# Patient Record
Sex: Male | Born: 1945 | Race: Black or African American | Hispanic: No | Smoking: Current every day smoker
Health system: Southern US, Community
[De-identification: ages and names within clinical notes are randomized; demographics above are authoritative.]

## PROBLEM LIST (undated history)

## (undated) ENCOUNTER — Emergency Department: Admission: EM | Payer: Medicare Other | Source: Home / Self Care

## (undated) DIAGNOSIS — C801 Malignant (primary) neoplasm, unspecified: Secondary | ICD-10-CM

## (undated) DIAGNOSIS — F17200 Nicotine dependence, unspecified, uncomplicated: Secondary | ICD-10-CM

## (undated) DIAGNOSIS — I509 Heart failure, unspecified: Secondary | ICD-10-CM

## (undated) DIAGNOSIS — J449 Chronic obstructive pulmonary disease, unspecified: Secondary | ICD-10-CM

## (undated) DIAGNOSIS — Z87442 Personal history of urinary calculi: Secondary | ICD-10-CM

## (undated) DIAGNOSIS — M1990 Unspecified osteoarthritis, unspecified site: Secondary | ICD-10-CM

## (undated) DIAGNOSIS — N400 Enlarged prostate without lower urinary tract symptoms: Secondary | ICD-10-CM

## (undated) DIAGNOSIS — R918 Other nonspecific abnormal finding of lung field: Secondary | ICD-10-CM

## (undated) DIAGNOSIS — I2510 Atherosclerotic heart disease of native coronary artery without angina pectoris: Secondary | ICD-10-CM

## (undated) DIAGNOSIS — E785 Hyperlipidemia, unspecified: Secondary | ICD-10-CM

## (undated) DIAGNOSIS — A159 Respiratory tuberculosis unspecified: Secondary | ICD-10-CM

## (undated) DIAGNOSIS — I279 Pulmonary heart disease, unspecified: Secondary | ICD-10-CM

## (undated) DIAGNOSIS — F419 Anxiety disorder, unspecified: Secondary | ICD-10-CM

## (undated) DIAGNOSIS — IMO0001 Reserved for inherently not codable concepts without codable children: Secondary | ICD-10-CM

## (undated) DIAGNOSIS — Z973 Presence of spectacles and contact lenses: Secondary | ICD-10-CM

## (undated) DIAGNOSIS — I10 Essential (primary) hypertension: Secondary | ICD-10-CM

## (undated) DIAGNOSIS — G2581 Restless legs syndrome: Secondary | ICD-10-CM

## (undated) HISTORY — DX: Essential (primary) hypertension: I10

## (undated) HISTORY — PX: COLONOSCOPY: SHX174

## (undated) HISTORY — DX: Other nonspecific abnormal finding of lung field: R91.8

## (undated) HISTORY — DX: Atherosclerotic heart disease of native coronary artery without angina pectoris: I25.10

## (undated) HISTORY — PX: BACK SURGERY: SHX140

## (undated) HISTORY — DX: Nicotine dependence, unspecified, uncomplicated: F17.200

## (undated) HISTORY — PX: OTHER SURGICAL HISTORY: SHX169

## (undated) HISTORY — DX: Heart failure, unspecified: I50.9

## (undated) HISTORY — DX: Hyperlipidemia, unspecified: E78.5

## (undated) HISTORY — PX: CERVICAL FUSION: SHX112

## (undated) HISTORY — DX: Pulmonary heart disease, unspecified: I27.9

## (undated) HISTORY — PX: FRACTURE SURGERY: SHX138

## (undated) HISTORY — DX: Benign prostatic hyperplasia without lower urinary tract symptoms: N40.0

---

## 2010-06-29 ENCOUNTER — Encounter: Admission: RE | Admit: 2010-06-29 | Discharge: 2010-06-29 | Payer: Self-pay | Admitting: Neurosurgery

## 2010-08-06 ENCOUNTER — Inpatient Hospital Stay (HOSPITAL_COMMUNITY): Admission: RE | Admit: 2010-08-06 | Discharge: 2010-08-07 | Payer: Self-pay | Admitting: Neurosurgery

## 2010-09-27 NOTE — H&P (Signed)
  NAMEJAKING, THAYER                 ACCOUNT NO.:  [id number]  MEDICAL RECORD NO.:  [id number]           PATIENT TYPE:  LOCATION:                                 FACILITY:  PHYSICIAN:  Coletta Memos, M.D.     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  INDICATIONS:  [name] is a gentleman who I have followed in my office for a number of years.  He presented with florid myelopathy secondary to cervical stenosis at C4-5 and C5-6 and with a spastic gait. He was able to maintain continence and bowel and bladder function.  PAST MEDICAL HISTORY: 1. Hypertension. 2. Arthritis. 3. Benign prostatic hypertrophy. 4. Cervical stenosis.  PHYSICAL EXAMINATION:  GENERAL:  He is alert, oriented x4, and answering all questions appropriately. HEENT:  Pupils equal, round, reactive to light.  Full extraocular movements.  Full visual fields.  Tongue protrudes in midline.  Uvula elevates midline. NEUROLOGIC:  Shoulder shrug is normal.  He has some mild weakness in the upper extremities.  He is myelopathic with hyperreflexia.  Positive Hoffmann's.  Downgoing toes to plantar stimulation.  Muscle tone, bulk is normal.  Coordination is poor.  He has a spastic gait. NECK:  No cervical masses or bruits. CHEST:  Lung fields clear. HEART:  Regular rhythm and rate.  No murmurs or rubs.  Pulses good at the wrists and feet bilaterally.  Mr. Codispoti medications include __________ , vitamin D3, cyclobenzaprine, aspirin, and hydrocodone.  He has no known drug allergies.  He is 65 years of age, weighs 85.5 kg and is 5 feet 11 inches tall.  Mr. Zerbe will be admitted today for anterior cervical decompression and arthrodesis at C4-5 and C5-6.  Risks and benefits,  bleeding, infection, no relief, need for further surgery, fusion failure, hardware failure, paralysis, weakness in one of both extremities or in all four extremities, bowel and bladder dysfunction were discussed.  He understands and wishes to proceed.          ______________________________ Coletta Memos, M.D.     KC/MEDQ  D:  09/17/2010  T:  09/18/2010  Job:  [id number]  Electronically Signed by Coletta Memos M.D. on 09/27/2010 10:47:11 AM

## 2010-11-18 LAB — BASIC METABOLIC PANEL
Calcium: 9.1 mg/dL (ref 8.4–10.5)
GFR calc non Af Amer: >60 mL/min (ref >60)
Glucose, Bld: 102 mg/dL — ABNORMAL HIGH (ref 70–99)
Potassium: 4.3 mEq/L (ref 3.5–5.1)
Sodium: 138 mEq/L (ref 135–145)

## 2010-11-18 LAB — CBC
HCT: 44.3 % (ref 39.0–52.0)
Hemoglobin: 15.2 g/dL (ref 13.0–17.0)
MCHC: 34.3 g/dL (ref 30.0–36.0)
RDW: 13.7 % (ref 11.5–15.5)
WBC: 5.7 10*3/uL (ref 4.0–10.5)

## 2010-11-18 LAB — SURGICAL PCR SCREEN
MRSA, PCR: NEGATIVE
Staphylococcus aureus: NEGATIVE

## 2010-11-18 LAB — PROTIME-INR
INR: 1.02 (ref 0.00–1.49)
Prothrombin Time: 13.6 seconds (ref 11.6–15.2)

## 2011-05-05 ENCOUNTER — Other Ambulatory Visit: Payer: Self-pay | Admitting: Neurosurgery

## 2011-05-05 DIAGNOSIS — M5126 Other intervertebral disc displacement, lumbar region: Secondary | ICD-10-CM

## 2011-05-09 ENCOUNTER — Ambulatory Visit
Admission: RE | Admit: 2011-05-09 | Discharge: 2011-05-09 | Disposition: A | Discharge status: Home / Self Care | Payer: Federal, State, Local not specified - PPO | Source: Ambulatory Visit | Attending: Neurosurgery | Admitting: Neurosurgery

## 2011-05-09 DIAGNOSIS — M5126 Other intervertebral disc displacement, lumbar region: Secondary | ICD-10-CM

## 2014-10-18 ENCOUNTER — Ambulatory Visit: Payer: Self-pay | Admitting: Urology

## 2015-02-14 ENCOUNTER — Other Ambulatory Visit: Payer: Self-pay

## 2015-02-14 ENCOUNTER — Other Ambulatory Visit: Payer: Self-pay | Admitting: *Deleted

## 2015-02-14 ENCOUNTER — Institutional Professional Consult (permissible substitution) (INDEPENDENT_AMBULATORY_CARE_PROVIDER_SITE_OTHER): Payer: Medicare Other | Admitting: Cardiothoracic Surgery

## 2015-02-14 VITALS — BP 155/86 | HR 71 | Resp 16 | Ht 68.0 in | Wt 204.0 lb

## 2015-02-14 DIAGNOSIS — R51 Headache: Secondary | ICD-10-CM

## 2015-02-14 DIAGNOSIS — R918 Other nonspecific abnormal finding of lung field: Secondary | ICD-10-CM | POA: Diagnosis not present

## 2015-02-14 DIAGNOSIS — R519 Headache, unspecified: Secondary | ICD-10-CM

## 2015-02-14 NOTE — Patient Instructions (Signed)
Lung Cancer Lung cancer is an abnormal growth of cells in one or both of your lungs. These extra cells may form a mass of tissue called a growth or tumor. Tumors can be either cancerous (malignant) or not cancerous (benign).  Lung cancer is the most common cause of cancer death in men and women. There are several different types of lung cancers. Usually, lung cancer is described as either small cell lung cancer or nonsmall cell lung cancer. Other types of cancer occur in the lungs, including carcinoid and cancers spread from other organs. The types of cancer have different behavior and treatment. RISK FACTORS Smoking is the most common risk factor for developing lung cancer. Other risk factors include:  Radon gas exposure.  Asbestos and other industrial substance exposure.  Second hand tobacco smoke.  Air pollution.  Family or personal history of lung cancer.  Age older than 49 years. CAUSES  Lung cancer usually starts when the lungs are exposed to harmful chemicals. Smoking is the most common risk factor for lung cancer. When you quit smoking, your risk of lung cancer falls each year (but is never the same as a person who has never smoked).  SYMPTOMS  Lung cancer may not have any symptoms in its early stages. The symptoms can depend on the type of cancer, its location, and other factors. Symptoms can include:  Cough (either new, different, or more severe).  Shortness of breath.  Coughing up blood (hemoptysis).  Chest pain.  Hoarseness.  Swelling of the face.  Drooping eyelid.  Changes in blood tests, such as low sodium (hyponatremia), high calcium (hypercalcemia), or low blood count (anemia).  Weight loss. DIAGNOSIS  Your health care provider may suspect lung cancer based on your symptoms or based on tests obtained for other reasons. Tests or procedures used to find or confirm the presence of lung cancer may include:  Chest X-ray.  CT scan of the lungs and  chest.  Blood tests.  Taking a tissue sample (biopsy) from your lung to look for cancer cells. Your cancer will be staged to determine its severity and extent. Staging is a careful attempt to find out the size of the tumor, whether the cancer has spread, and if so, to what parts of the body. You may need to have more tests to determine the stage of your cancer. The test results will help determine what treatment plan is best for you.   Stage 0--This is the earliest stage of lung cancer. In this stage the tumor is present in only a few layers of cells and has not grown beyond the inner lining of the lungs. Stage 0 (carcinoma in situ) is considered noninvasive, meaning at this stage it is not yet capable of spreading to other regions.  Stage I-- The cancer is located only in the lungs and not spread to any lymph nodes.  Stage II--The cancer is in the lungs and the nearby lymph nodes.  Stage III--The cancer is in the lungs and the lymph nodes in the middle of the chest. This is also called locally advanced disease. This stage has two subtypes:  Stage IIIa - The cancer has spread only to lymph nodes on the same side of the chest where the cancer started.  Stage IIIb - The cancer has spread to lymph nodes on the opposite side of the chest or above the collar bone.  Stage IV-- This is the most advanced stage of lung cancer and is also called advanced disease.  This stage describes when the cancer has spread to both lungs, the fluid in the area around the lungs, or to another body part. Your health care provider may tell you the detailed stage of your cancer, which includes both a number and a letter.  TREATMENT  Depending on the type and stage, lung cancer may be treated with surgery, radiation therapy, chemotherapy, or targeted therapy. Some people have a combination of these therapies. Your treatment plan will be developed by your health care team.  [place] not smoke.  Only  take over-the-counter or prescription medicines for pain, discomfort, or fever as directed by your health care provider.  Maintain a healthy diet.  Consider joining a support group. This may help you learn to cope with the stress of having lung cancer.  Seek advice to help you manage treatment side effects.  Keep all follow-up appointments as directed by your health care provider.  Inform your cancer specialist if you are admitted to the hospital. [place] IF:   You are losing weight without trying.  You have a persistent cough.  You feel short of breath.  You tire easily. SEEK IMMEDIATE MEDICAL CARE IF:   You cough up clotted blood or bright red blood.  Your pain is not manageable or controlled by medicine.  You develop new difficulty breathing or chest pain.  You develop swelling in one or both ankles or legs, or swelling in your face or neck.  You develop headache or confusion. Document Released: 11/30/2000 Document Revised: 06/14/2013 Document Reviewed: 12/28/2013 [facility] Patient Information 2015 [place], [place]. This information is not intended to replace advice given to you by your health care provider. Make sure you discuss any questions you have with your health care provider. Pulmonary Nodule A pulmonary nodule is a small, round growth of tissue in the lung. Pulmonary nodules can range in size from less than 1/5 inch (4 mm) to a little bigger than an inch (25 mm). Most pulmonary nodules are detected when imaging tests of the lung are being performed for a different problem. Pulmonary nodules are usually not cancerous (benign). However, some pulmonary nodules are cancerous (malignant). Follow-up treatment or testing is based on the size of the pulmonary nodule and your risk of getting lung cancer.  CAUSES Benign pulmonary nodules can be caused by various things. Some of the causes include:   Bacterial, fungal, or viral infections. This is usually an old  infection that is no longer active, but it can sometimes be a current, active infection.  A benign mass of tissue.  Inflammation from conditions such as rheumatoid arthritis.   Abnormal blood vessels in the lungs. Malignant pulmonary nodules can result from lung cancer or from cancers that spread to the lung from other places in the body. SIGNS AND SYMPTOMS Pulmonary nodules usually do not cause symptoms. DIAGNOSIS Most often, pulmonary nodules are found incidentally when an X-ray or CT scan is performed to look for some other problem in the lung area. To help determine whether a pulmonary nodule is benign or malignant, your health care provider will take a medical history and order a variety of tests. Tests done may include:   Blood tests.  A skin test called a tuberculin test. This test is used to determine if you have been exposed to the germ that causes tuberculosis.   Chest X-rays. If possible, a new X-ray may be compared with X-rays you have had in the past.   CT  scan. This test shows smaller pulmonary nodules more clearly than an X-ray.   Positron emission tomography (PET) scan. In this test, a safe amount of a radioactive substance is injected into the bloodstream. Then, the scan takes a picture of the pulmonary nodule. The radioactive substance is eliminated from your body in your urine.   Biopsy. A tiny piece of the pulmonary nodule is removed so it can be checked under a microscope. TREATMENT  Pulmonary nodules that are benign normally do not require any treatment because they usually do not cause symptoms or breathing problems. Your health care provider may want to monitor the pulmonary nodule through follow-up CT scans. The frequency of these CT scans will vary based on the size of the nodule and the risk factors for lung cancer. For example, CT scans will need to be done more frequently if the pulmonary nodule is larger and if you have a history of smoking and a family  history of cancer. Further testing or biopsies may be done if any follow-up CT scan shows that the size of the pulmonary nodule has increased. HOME CARE INSTRUCTIONS  Only take over-the-counter or prescription medicines as directed by your health care provider.  Keep all follow-up appointments with your health care provider. SEEK MEDICAL CARE IF:  You have trouble breathing when you are active.   You feel sick or unusually tired.   You do not feel like eating.   You lose weight without trying to.   You develop chills or night sweats.  SEEK IMMEDIATE MEDICAL CARE IF:  You cannot catch your breath, or you begin wheezing.   You cannot stop coughing.   You cough up blood.   You become dizzy or feel like you are going to pass out.   You have sudden chest pain.   You have a fever or persistent symptoms for more than 2-3 days.   You have a fever and your symptoms suddenly get worse. MAKE SURE YOU:  Understand these instructions.  Will watch your condition.  Will get help right away if you are not doing well or get worse. Document Released: 06/21/2009 Document Revised: 04/26/2013 Document Reviewed: 02/13/2013 [facility] Patient Information 2015 [place], [place]. This information is not intended to replace advice given to you by your health care provider. Make sure you discuss any questions you have with your health care provider.   Smoking Cessation Quitting smoking is important to your health and has many advantages. However, it is not always easy to quit since nicotine is a very addictive drug. Oftentimes, people try 3 times or more before being able to quit. This document explains the best ways for you to prepare to quit smoking. Quitting takes hard work and a lot of effort, but you can do it. ADVANTAGES OF QUITTING SMOKING  You will live longer, feel better, and live better.  Your body will feel the impact of quitting smoking almost immediately.  Within 20  minutes, blood pressure decreases. Your pulse returns to its normal level.  After 8 hours, carbon monoxide levels in the blood return to normal. Your oxygen level increases.  After 24 hours, the chance of having a heart attack starts to decrease. Your breath, hair, and body stop smelling like smoke.  After 48 hours, damaged nerve endings begin to recover. Your sense of taste and smell improve.  After 72 hours, the body is virtually free of nicotine. Your bronchial tubes relax and breathing becomes easier.  After 2 to 12 weeks, lungs  can hold more air. Exercise becomes easier and circulation improves.  The risk of having a heart attack, stroke, cancer, or lung disease is greatly reduced.  After 1 year, the risk of coronary heart disease is cut in half.  After 5 years, the risk of stroke falls to the same as a nonsmoker.  After 10 years, the risk of lung cancer is cut in half and the risk of other cancers decreases significantly.  After 15 years, the risk of coronary heart disease drops, usually to the level of a nonsmoker.  If you are pregnant, quitting smoking will improve your chances of having a healthy baby.  The people you live with, especially any children, will be healthier.  You will have extra money to spend on things other than cigarettes. QUESTIONS TO THINK ABOUT BEFORE ATTEMPTING TO QUIT You may want to talk about your answers with your health care provider.  Why do you want to quit?  If you tried to quit in the past, what helped and what did not?  What will be the most difficult situations for you after you quit? How will you plan to handle them?  Who can help you through the tough times? Your family? Friends? A health care provider?  What pleasures do you get from smoking? What ways can you still get pleasure if you quit? Here are some questions to ask your health care provider:  How can you help me to be successful at quitting?  What medicine do you think  would be best for me and how should I take it?  What should I do if I need more help?  What is smoking withdrawal like? How can I get information on withdrawal? GET READY  Set a quit date.  Change your environment by getting rid of all cigarettes, ashtrays, matches, and lighters in your home, car, or work. Do not let people smoke in your home.  Review your past attempts to quit. Think about what worked and what did not. GET SUPPORT AND ENCOURAGEMENT You have a better chance of being successful if you have help. You can get support in many ways.  Tell your family, friends, and coworkers that you are going to quit and need their support. Ask them not to smoke around you.  Get individual, group, or telephone counseling and support. Programs are available at [employer] and health centers. Call your local health department for information about programs in your area.  Spiritual beliefs and practices may help some smokers quit.  Download a "quit meter" on your computer to keep track of quit statistics, such as how long you have gone without smoking, cigarettes not smoked, and money saved.  Get a self-help book about quitting smoking and staying off tobacco. English yourself from urges to smoke. Talk to someone, go for a walk, or occupy your time with a task.  Change your normal routine. Take a different route to work. Drink tea instead of coffee. Eat breakfast in a different place.  Reduce your stress. Take a hot bath, exercise, or read a book.  Plan something enjoyable to do every day. Reward yourself for not smoking.  Explore interactive web-based programs that specialize in helping you quit. GET MEDICINE AND USE IT CORRECTLY Medicines can help you stop smoking and decrease the urge to smoke. Combining medicine with the above behavioral methods and support can greatly increase your chances of successfully quitting smoking.  Nicotine replacement  therapy helps deliver  nicotine to your body without the negative effects and risks of smoking. Nicotine replacement therapy includes nicotine gum, lozenges, inhalers, nasal sprays, and skin patches. Some may be available over-the-counter and others require a prescription.  Antidepressant medicine helps people abstain from smoking, but how this works is unknown. This medicine is available by prescription.  Nicotinic receptor partial agonist medicine simulates the effect of nicotine in your brain. This medicine is available by prescription. Ask your health care provider for advice about which medicines to use and how to use them based on your health history. Your health care provider will tell you what side effects to look out for if you choose to be on a medicine or therapy. Carefully read the information on the package. Do not use any other product containing nicotine while using a nicotine replacement product.  RELAPSE OR DIFFICULT SITUATIONS Most relapses occur within the first 3 months after quitting. Do not be discouraged if you start smoking again. Remember, most people try several times before finally quitting. You may have symptoms of withdrawal because your body is used to nicotine. You may crave cigarettes, be irritable, feel very hungry, cough often, get headaches, or have difficulty concentrating. The withdrawal symptoms are only temporary. They are strongest when you first quit, but they will go away within 10-14 days. To reduce the chances of relapse, try to:  Avoid drinking alcohol. Drinking lowers your chances of successfully quitting.  Reduce the amount of caffeine you consume. Once you quit smoking, the amount of caffeine in your body increases and can give you symptoms, such as a rapid heartbeat, sweating, and anxiety.  Avoid smokers because they can make you want to smoke.  Do not let weight gain distract you. Many smokers will gain weight when they quit, usually less than 10  pounds. Eat a healthy diet and stay active. You can always lose the weight gained after you quit.  Find ways to improve your mood other than smoking. FOR MORE INFORMATION  www.smokefree.gov  Document Released: 08/18/2001 Document Revised: 01/08/2014 Document Reviewed: 12/03/2011 [facility] Patient Information 2015 [place], [place]. This information is not intended to replace advice given to you by your health care provider. Make sure you discuss any questions you have with your health care provider.

## 2015-02-14 NOTE — Progress Notes (Signed)
[address]       [place] [address]             [phone]                    Fady Anthony Whitebread Rennert Medical Record #[id number] Date of Birth: 07-07-1946  Referring: Dionisio David, MD Primary Care: [name], MD  Chief Complaint:    Chief Complaint  Patient presents with  . Lung Mass    Surgical eval, Chest CT 01/31/2015    History of Present Illness:    Colton Nguyen 69 y.o. male is seen in the office  today for new finding of right lung and hilar mass. The patient had noted increasing shortness of breath with exertion and was referred to Dr. Chancy Milroy. To evaluate for potential coronary artery disease a cardiac CT was performed that demonstrated some coronary artery disease but a spiculated mass was noted in the right lung. The patient is referred for further evaluation of what appears to be radiographically advanced stage lung cancer.    Current Activity/ Functional Status:  Patient is independent with mobility/ambulation, transfers, ADL's, IADL's.   Zubrod Score: At the time of surgery this patient's most appropriate activity status/level should be described as: '[]'$     0    Normal activity, no symptoms '[x]'$     1    Restricted in physical strenuous activity but ambulatory, able to do out light work '[]'$     2    Ambulatory and capable of self care, unable to do work activities, up and about               >50 % of waking hours                              '[]'$     3    Only limited self care, in bed greater than 50% of waking hours '[]'$     4    Completely disabled, no self care, confined to bed or chair '[]'$     5    Moribund   Past Medical History  Diagnosis Date  . CHF (congestive heart failure)   . Hypertension   . Hyperlipidemia   . BPH (benign prostatic hyperplasia)   . CAD (coronary artery disease)   . Chronic cardiopulmonary disease   . Nicotine dependence   . Mass of lung     Past Surgical History  Procedure Laterality Date   . Cad ccta 01/31/15    . 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 Cancer    . Heart disease Father Patient's father died at age 45 of lung cancer  . Hypertension    Patient's mother died at age 10 with respiratory failure He has one brother who is had a stroke, one sister with severe diabetes,  myocardial infarction x 5 and bilateral amputations  History   Social History  . Marital Status: Divorced    Spouse Name: N/A  . Number of Children: 1  . Years of Education: N/A   Occupational History  . 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  Smoking status: Current Every Day Smoker -- 2.00 packs/day for 47 years    Types: Cigarettes    Start date: 02/14/1967  . Smokeless tobacco: Never Used  . Alcohol Use: 0.0 oz/week    0 Standard drinks or equivalent per week  . Drug Use: No  . Sexual Activity: Not on file          Social History Narrative  . Patient lives alone, does have one son who is autistic.    History  Smoking status  . Current Every Day Smoker -- 2.00 packs/day for 47 years  . Types: Cigarettes  . Start date: 02/14/1967  Smokeless tobacco  . Never Used    History  Alcohol Use  . 0.0 oz/week  . 0 Standard drinks or equivalent per week     No Known Allergies  Current Outpatient Prescriptions  Medication Sig Dispense Refill  . amLODipine (NORVASC) 10 MG tablet Take by mouth daily.     Marland Kitchen atorvastatin (LIPITOR) 40 MG tablet Take by mouth daily.     Marland Kitchen BYSTOLIC 20 MG TABS Take 20 mg by mouth daily.     . CHANTIX STARTING MONTH PAK 0.5 MG X 11 & 1 MG X 42 tablet     . finasteride (PROSCAR) 5 MG tablet Take 5 mg by mouth daily.     Marland Kitchen losartan (COZAAR) 100 MG tablet Take 100 mg by mouth daily.     .  metoprolol (LOPRESSOR) 50 MG tablet Take 50 mg by mouth daily.     Marland Kitchen PROAIR HFA 108 (90 BASE) MCG/ACT inhaler 2 puffs every 4 (four) hours as needed.     . tamsulosin (FLOMAX) 0.4 MG CAPS capsule Take 0.4 mg by mouth daily.      No current facility-administered medications for this visit.      Review of Systems:     Cardiac Review of Systems: Y or N  Chest Pain [  n  ]  Resting SOB [n   ] Exertional SOB  [ y ]  Vertell Limber Florencio.Farrier  ]   Pedal Edema [ n  ]    Palpitations [n  ] Syncope  Florencio.Farrier ]   Presyncope [ n  ]  General Review of Systems: [Y] = yes [  ]=no Constitional: recent weight change [ n ];  Wt loss over the last 3 months [   ] anorexia [  ]; fatigue [ y ]; nausea [  ]; night sweats [ n ]; fever [ n]; or chills [ n ];          Dental: poor dentition[  ]; Last Dentist visit:   Eye : blurred vision [  ]; diplopia [   ]; vision changes [  ];  Amaurosis fugax[  ]; Resp: cough [  ];  wheezing[ n ];  hemoptysis[n  ]; shortness of breath[ y ]; paroxysmal nocturnal dyspnea[y  ]; dyspnea on exertion[y  ]; or orthopnea[y  ];  GI:  gallstones[ n ], vomiting[  n];  dysphagia[n  ]; melena[  ];  hematochezia [  ]; heartburn[  ];   Hx of  Colonoscopy[ y ]; GU: kidney stones [  ]; hematuria[  ];   dysuria [  ];  nocturia[  ];  history of     obstruction [  ]; urinary frequency [  ]             Skin: rash, swelling[  ];, hair loss[  ];  peripheral edema[  ];  or itching[  ]; Musculosketetal: myalgias[  ];  joint swelling[  ];  joint erythema[  ];  joint pain[  ];  back pain[  ];  Heme/Lymph: bruising[  ];  bleeding[  ];  anemia[  ];  Neuro: TIA[ n ];  headaches[  ];  stroke[  ];  vertigo[  ];  seizures[  ];   paresthesias[  ];  difficulty walking[y  ];  Psych:depression[  ]; anxiety[  ];  Endocrine: diabetes[  ];  thyroid dysfunction[  ];  Immunizations: Flu up to date Blue.Reese  ]; Pneumococcal up to date Florencio.Farrier  ];  Other:  Physical Exam: BP 155/86 mmHg  Pulse 71  Resp 16  Ht '5\' 8"'$  (1.727 m)  Wt 204 lb  (92.534 kg)  BMI 31.03 kg/m2  SpO2 98%  PHYSICAL EXAMINATION: General appearance: alert, cooperative and appears stated age Head: Normocephalic, without obvious abnormality, atraumatic Neck: no adenopathy, no carotid bruit, no JVD, supple, symmetrical, trachea midline and thyroid not enlarged, symmetric, no tenderness/mass/nodules Lymph nodes: Cervical, supraclavicular, and axillary nodes normal. Resp: clear to auscultation bilaterally Back: symmetric, no curvature. ROM normal. No CVA tenderness. Cardio: regular rate and rhythm, S1, S2 normal, no murmur, click, rub or gallop GI: soft, non-tender; bowel sounds normal; no masses,  no organomegaly Extremities: extremities normal, atraumatic, no cyanosis or edema and Homans sign is negative, no sign of DVT Neurologic: Gait: Drop-foot right  Diagnostic Studies & Laboratory data:     Recent Radiology Findings:   CT done in [place] Cardiac CT: Calcium score 125.6 Right dominant system Mid lad mild to moderate disease, crx and rco OK.   Irregular mass in the right upper lobe medially abutting the mediastinum at the inferior aspect of the mass there is extension into the right hilar region the mass measures 4.5 x 4.0 x 7.5 cm there is also a small nodule In the superior segment of the right lower lobe posteriorly measuring 1 cm suspicious for a second primary or metastatic nodule there is no evidence of pleural effusion., Is questionable enlargement of the adrenal glands.   I have independently reviewed the above radiologic studies.  Recent Lab Findings: Lab Results  Component Value Date   WBC 5.7 07/30/2010   HGB 15.2 07/30/2010   HCT 44.3 07/30/2010   PLT 224 07/30/2010   GLUCOSE 102* 07/30/2010   NA 138 07/30/2010   K 4.3 07/30/2010   CL 108 07/30/2010   CREATININE 1.11 07/30/2010   BUN 10 07/30/2010   CO2 24 07/30/2010   INR 1.02 07/30/2010      Assessment / Plan:   Patient with new diagnosis of right lung mass with  mediastinal involvement advanced stage at least  Stage IIIa/b or stage IV if there is evidence of adrenal metastasis. I discussed with the patient the findings of the CT and suggested to him proceeding with further diagnostic studies including staging PET scan, MRI of the brain, and depending on the PET scan results decide on a biopsy strategy to confirm a tissue diagnosis. His neighbor is a current patient of the [facility] at [facility] and he would like to be treated there.     I  spent 40 minutes counseling the patient face to face and 50% or more the  time was spent in counseling and coordination of care. The total time spent in the appointment was 60 minutes.  Grace Isaac MD      [address] [place] [address]  Office [phone]   Beeper [phone]  02/14/2015 4:42 PM

## 2015-02-20 ENCOUNTER — Ambulatory Visit
Admission: RE | Admit: 2015-02-20 | Discharge: 2015-02-20 | Disposition: A | Discharge status: Home / Self Care | Payer: Medicare Other | Source: Ambulatory Visit | Attending: Cardiothoracic Surgery | Admitting: Cardiothoracic Surgery

## 2015-02-20 DIAGNOSIS — R918 Other nonspecific abnormal finding of lung field: Secondary | ICD-10-CM | POA: Diagnosis not present

## 2015-02-20 DIAGNOSIS — J019 Acute sinusitis, unspecified: Secondary | ICD-10-CM

## 2015-02-20 DIAGNOSIS — R519 Headache, unspecified: Secondary | ICD-10-CM

## 2015-02-20 DIAGNOSIS — R51 Headache: Secondary | ICD-10-CM

## 2015-02-20 LAB — GLUCOSE, CAPILLARY: GLUCOSE-CAPILLARY: 100 mg/dL — AB (ref 65–99)

## 2015-02-20 MED ORDER — FLUDEOXYGLUCOSE F - 18 (FDG) INJECTION
13.0300 | Freq: Once | INTRAVENOUS | Status: AC | PRN
  Administered 2015-02-20: 13.03 via INTRAVENOUS

## 2015-02-21 ENCOUNTER — Ambulatory Visit
Admission: RE | Admit: 2015-02-21 | Discharge: 2015-02-21 | Disposition: A | Discharge status: Home / Self Care | Payer: Medicare Other | Source: Ambulatory Visit | Attending: Cardiothoracic Surgery | Admitting: Cardiothoracic Surgery

## 2015-02-21 DIAGNOSIS — R519 Headache, unspecified: Secondary | ICD-10-CM

## 2015-02-21 DIAGNOSIS — R918 Other nonspecific abnormal finding of lung field: Secondary | ICD-10-CM | POA: Diagnosis not present

## 2015-02-21 DIAGNOSIS — R51 Headache: Secondary | ICD-10-CM

## 2015-02-21 MED ORDER — GADOBENATE DIMEGLUMINE 529 MG/ML IV SOLN
19.0000 mL | Freq: Once | INTRAVENOUS | Status: AC | PRN
  Administered 2015-02-21: 20 mL via INTRAVENOUS

## 2015-02-25 ENCOUNTER — Encounter: Payer: Self-pay | Admitting: Cardiothoracic Surgery

## 2015-02-25 ENCOUNTER — Other Ambulatory Visit: Payer: Self-pay | Admitting: *Deleted

## 2015-02-25 ENCOUNTER — Ambulatory Visit (INDEPENDENT_AMBULATORY_CARE_PROVIDER_SITE_OTHER): Payer: Medicare Other | Admitting: Cardiothoracic Surgery

## 2015-02-25 VITALS — BP 154/84 | HR 60 | Resp 16 | Ht 68.0 in | Wt 204.0 lb

## 2015-02-25 DIAGNOSIS — R222 Localized swelling, mass and lump, trunk: Secondary | ICD-10-CM | POA: Diagnosis not present

## 2015-02-25 DIAGNOSIS — R918 Other nonspecific abnormal finding of lung field: Secondary | ICD-10-CM

## 2015-02-25 NOTE — Progress Notes (Signed)
Colton Nguyen       [place] [address]             [phone]                    Colton Nguyen Colton Nguyen Date of Birth: Mar 02, 1946  Referring: Colton David, MD Primary Care: Colton Market, MD  Chief Complaint:    Chief Complaint  Patient presents with  . Follow-up    after PET and PFTS    History of Present Illness:    Colton Nguyen 69 y.o. male is seen in the office  today for new finding of right lung and hilar mass. The patient had noted increasing shortness of breath with exertion and was referred to Dr. Chancy Milroy. To evaluate for potential coronary artery disease a cardiac CT was performed that demonstrated some coronary artery disease but a spiculated mass was noted in the right lung. Since seen last week patient has had PET Scan and MRI of the brain. Current Activity/ Functional Status:  Patient is independent with mobility/ambulation, transfers, ADL's, IADL's.   Zubrod Score: At the time of surgery this patient's most appropriate activity status/level should be described as: '[]'$     0    Normal activity, no symptoms '[x]'$     1    Restricted in physical strenuous activity but ambulatory, able to do out light work '[]'$     2    Ambulatory and capable of self care, unable to do work activities, up and about               >50 % of waking hours                              '[]'$     3    Only limited self care, in bed greater than 50% of waking hours '[]'$     4    Completely disabled, no self care, confined to bed or chair '[]'$     5    Moribund   Past Medical History  Diagnosis Date  . CHF (congestive heart failure)   . Hypertension   . Hyperlipidemia   . BPH (benign prostatic hyperplasia)   . CAD (coronary artery disease)   . Chronic cardiopulmonary disease   . Nicotine dependence   . Mass of lung     Past Surgical History  Procedure Laterality Date  . Cad ccta 01/31/15    . 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 Cancer    . Heart disease Father Patient's father died at age 60 of lung cancer  . Hypertension    Patient's mother died at age 17 with respiratory failure He has one brother who is had a stroke, one sister with severe diabetes,  myocardial infarction x 5 and bilateral amputations  History   Social History  . Marital Status: Divorced    Spouse Name: N/A  . Number of Children: 1  . Years of Education: N/A   Occupational History  . 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 Smoking status: Current Every Day Smoker -- 2.00  packs/day for 47 years    Types: Cigarettes    Start date: 02/14/1967  . Smokeless tobacco: Never Used  . Alcohol Use: 0.0 oz/week    0 Standard drinks or equivalent per week  . Drug Use: No  . Sexual Activity: Not on file          Social History Narrative  . Patient lives alone, does have one son who is autistic.    History  Smoking status  . Current Every Day Smoker -- 2.00 packs/day for 47 years  . Types: Cigarettes  . Start date: 02/14/1967  Smokeless tobacco  . Never Used    History  Alcohol Use  . 0.0 oz/week  . 0 Standard drinks or equivalent per week     No Known Allergies  Current Outpatient Prescriptions  Medication Sig Dispense Refill  . amLODipine (NORVASC) 10 MG tablet Take by mouth daily.     Marland Kitchen aspirin 81 MG tablet Take 81 mg by mouth daily.    Marland Kitchen atorvastatin (LIPITOR) 40 MG tablet Take by mouth daily.     Marland Kitchen BYSTOLIC 20 MG TABS Take 20 mg by mouth daily.     . CHANTIX STARTING MONTH PAK 0.5 MG X 11 & 1 MG X 42 tablet     . Cholecalciferol (VITAMIN D3) 50000 UNITS CAPS Take 1 capsule by mouth daily.    . finasteride (PROSCAR) 5 MG tablet Take 5 mg by mouth daily.     Marland Kitchen  PROAIR HFA 108 (90 BASE) MCG/ACT inhaler 2 puffs every 4 (four) hours as needed.     . tamsulosin (FLOMAX) 0.4 MG CAPS capsule Take 0.4 mg by mouth daily.     Marland Kitchen losartan (COZAAR) 100 MG tablet Take 100 mg by mouth daily.      No current facility-administered medications for this visit.      Review of Systems:     Cardiac Review of Systems: Y or N  Chest Pain [  n  ]  Resting SOB [n   ] Exertional SOB  [ y ]  Colton Nguyen  ]   Pedal Edema [ n  ]    Palpitations [n  ] Syncope  Colton Nguyen ]   Presyncope [ n  ]  General Review of Systems: [Y] = yes [  ]=no Constitional: recent weight change [ n ];  Wt loss over the last 3 months [   ] anorexia [  ]; fatigue [ y ]; nausea [  ]; night sweats [ n ]; fever [ n]; or chills [ n ];          Dental: poor dentition[  ]; Last Dentist visit:   Eye : blurred vision [  ]; diplopia [   ]; vision changes [  ];  Amaurosis fugax[  ]; Resp: cough [  ];  wheezing[ n ];  hemoptysis[n  ]; shortness of breath[ y ]; paroxysmal nocturnal dyspnea[y  ]; dyspnea on exertion[y  ]; or orthopnea[y  ];  GI:  gallstones[ n ], vomiting[  n];  dysphagia[n  ]; melena[  ];  hematochezia [  ]; heartburn[  ];   Hx of  Colonoscopy[ y ]; GU: kidney stones [  ]; hematuria[  ];   dysuria [  ];  nocturia[  ];  history of     obstruction [  ]; urinary frequency [  ]             Skin: rash, swelling[  ];, hair loss[  ];  peripheral edema[  ];  or itching[  ]; Musculosketetal: myalgias[  ];  joint swelling[  ];  joint erythema[  ];  joint pain[  ];  back pain[  ];  Heme/Lymph: bruising[  ];  bleeding[  ];  anemia[  ];  Neuro: TIA[ n ];  headaches[  ];  stroke[  ];  vertigo[  ];  seizures[  ];   paresthesias[  ];  difficulty walking[y  ];  Psych:depression[  ]; anxiety[  ];  Endocrine: diabetes[  ];  thyroid dysfunction[  ];  Immunizations: Flu up to date Blue.Reese  ]; Pneumococcal up to date Colton Nguyen  ];  Other:  Physical Exam: BP 154/84 mmHg  Pulse 60  Resp 16  Ht '5\' 8"'$  (1.727 m)  Wt 204 lb (92.534  kg)  BMI 31.03 kg/m2  SpO2 97%  PHYSICAL EXAMINATION: General appearance: alert, cooperative and appears stated age Head: Normocephalic, without obvious abnormality, atraumatic Neck: no adenopathy, no carotid bruit, no JVD, supple, symmetrical, trachea midline and thyroid not enlarged, symmetric, no tenderness/mass/nodules Lymph nodes: Cervical, supraclavicular, and axillary nodes normal. Resp: clear to auscultation bilaterally Back: symmetric, no curvature. ROM normal. No CVA tenderness. Cardio: regular rate and rhythm, S1, S2 normal, no murmur, click, rub or gallop GI: soft, non-tender; bowel sounds normal; no masses,  no organomegaly Extremities: extremities normal, atraumatic, no cyanosis or edema and Homans sign is negative, no sign of DVT Neurologic: Gait: Drop-foot right  Diagnostic Studies & Laboratory data:     Recent Radiology Findings:   CT done in [place] Cardiac CT: Calcium score 125.6 Right dominant system Mid lad mild to moderate disease, crx and rco OK.   Irregular mass in the right upper lobe medially abutting the mediastinum at the inferior aspect of the mass there is extension into the right hilar region the mass measures 4.5 x 4.0 x 7.5 cm there is also a small nodule In the superior segment of the right lower lobe posteriorly measuring 1 cm suspicious for a second primary or metastatic nodule there is no evidence of pleural effusion., Is questionable enlargement of the adrenal glands.   I have independently reviewed the above radiologic studies.  PET: Colton Nguyen Contrast  02/21/2015   CLINICAL DATA:  Possible right upper lobe lung cancer on recent chest CT and PET-CT. Headache. Evaluation for metastatic disease.  EXAM: MRI HEAD WITHOUT AND WITH CONTRAST  TECHNIQUE: Multiplanar, multiecho pulse sequences of the brain and surrounding structures were obtained without and with intravenous contrast.  CONTRAST:  69m MULTIHANCE GADOBENATE DIMEGLUMINE 529 MG/ML IV  SOLN  COMPARISON:  None.  FINDINGS: There is no evidence of acute infarct, intracranial hemorrhage, mass, midline shift, or extra-axial fluid collection. Ventricles and sulci are within normal limits for age. No significant white matter disease is identified. No abnormal enhancement is identified.  Visualized upper cervical spine partially demonstrates artifact from prior anterior fusion as well as a broad-based posterior disc osteophyte complex at C3-4 resulting in at least a mild impression on the spinal cord. Moderate mucosal thickening and bubbly secretions are present in the left maxillary sinus. There is mild right frontal sinus mucosal thickening. Trace right mastoid fluid is noted. Major intracranial vascular flow voids are preserved.  IMPRESSION: 1. Unremarkable appearance of the brain for age. No evidence of intracranial metastases. 2. Left maxillary sinus mucosal thickening and fluid. Correlate clinically for acute sinusitis.   Electronically Signed   By: ALogan Nguyen  On: 02/21/2015 14:16   Nm  Pet Image Initial (pi) Skull Base To Thigh  02/20/2015   CLINICAL DATA:  Initial treatment strategy for lung mass.  EXAM: NUCLEAR MEDICINE PET SKULL BASE TO THIGH  TECHNIQUE: 13.0 mCi F-18 FDG was injected intravenously. Full-ring PET imaging was performed from the skull base to thigh after the radiotracer. CT data was obtained and used for attenuation correction and anatomic localization.  FASTING BLOOD GLUCOSE:  Value: 100 mg/dl  COMPARISON:  Outside CT 02/04/2015  FINDINGS: NECK  No hypermetabolic lymph nodes in the neck.  CHEST  There is ill-defined thickened tissue extending from the superior aspect of the right hilum along the right upper lobe bronchus to the medial aspect of the a right upper lobe. This thickening measures 32 by 27 mm in axial dimension on image 80 series 3 with SUV max 7.3.  There is a hypermetabolic focus at the right hilum which likely corresponds to soft tissue (image 95 measuring 17  mm. This is more intense than the upper lobe peribronchial thickening with SUV max 11.9  Within the superior aspect of the right lower lobe 9 mm nodule on image 91, series 3 is hypermetabolic with SUV max 4.2.  There is moderate metabolic activity associated with right lower paratracheal lymph node which is enlarged to 15 mm on image 93 series 3 with SUV max 3.8.  ABDOMEN/PELVIS  No abnormal metabolic activity the liver. 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 No hypermetabolic abdominal pelvic adenopathy. Prostate gland is not significantly enlarged to 6.5 cm. Soft tissue within the right inguinal region without associated metabolic activity  SKELETON  No focal hypermetabolic activity to suggest skeletal metastasis.  IMPRESSION: 1. Hypermetabolic peribronchial thickening in the superior right hilum extending into the right upper lobe is consists with bronchogenic carcinoma versus postobstructive pneumonitis. Favor malignancy. 2. Hypermetabolic nodal at the right hilum versus primary lesion. 3. Moderately Hypermetabolic right lower paratracheal lymph node suspicious for ipsilateral mediastinal metastasis. 4. Hypermetabolic nodule within the superior segment of the right lower lobe is concerning for metastatic disease versus second primary lesion.   Electronically Signed   By: Colton Nguyen M.D.   On: 02/20/2015 13:04     Recent Lab Findings: Lab Results  Component Value Date   WBC 5.7 07/30/2010   HGB 15.2 07/30/2010   HCT 44.3 07/30/2010   PLT 224 07/30/2010   GLUCOSE 102* 07/30/2010   NA 138 07/30/2010   K 4.3 07/30/2010   CL 108 07/30/2010   CREATININE 1.11 07/30/2010   BUN 10 07/30/2010   CO2 24 07/30/2010   INR 1.02 07/30/2010      Assessment / Plan:   Patient with new diagnosis of right lung mass with mediastinal involvement advanced stage at least  Stage IIIa/b or stage IV . I discussed with the patient the findings of the PET and suggested to him  proceeding with further diagnostic studies including and proceed with bronchoscopy and EBUS with biopsy , posible mediastinoscopy.  His neighbor is a current patient of the [facility] onc[facility] at [facility] and he would like to be treated there.     I  Spent 15 minutes counseling the patient face to face and 50% or more the  time was spent in counseling and coordination of care. The total time spent in the appointment was 25  minutes.  Grace Isaac MD      [address] [place] [address] Office [phone]   Beeper [phone]  02/25/2015 9:21 PM

## 2015-02-26 ENCOUNTER — Telehealth: Payer: Self-pay | Admitting: *Deleted

## 2015-02-26 ENCOUNTER — Encounter (HOSPITAL_COMMUNITY): Payer: Self-pay | Admitting: *Deleted

## 2015-02-26 DIAGNOSIS — R918 Other nonspecific abnormal finding of lung field: Secondary | ICD-10-CM

## 2015-02-26 MED ORDER — DEXTROSE 5 % IV SOLN
1.5000 g | INTRAVENOUS | Status: AC
  Administered 2015-02-27: 1.5 g via INTRAVENOUS
  Filled 2015-02-26: qty 1.5

## 2015-02-26 NOTE — Progress Notes (Signed)
Anesthesia Chart Review:  Pt is 69 year old male scheduled for video bronchoscopy with endobronchial ultrasound on 02/27/2015 with Dr. Servando Snare.   Pt is same day work up.   PMH includes: CHF, CAD, HTN, COPD, hyperlipidemia. Current smoker. BMI 31.   Medications include: amlodipine, ASA, lipitor, bystolic, losartan, chantix, albuterol.   Labs, CXR and EKG will be done DOS.   Nuclear stress test 01/15/2015 (can be found under media tab): -EF 81%. Small mild inferior wall defect most likely due to diaphragmatic attenuation -equivocal stress test with normal LVEF, with moderate risk Duke treadmill score.   Cardiac CT 01/31/2015: -mid LAD artery had moderate (50-69%) stenosis -calcium score is 125.6 -R dominant system  If labs, EKG and CXR acceptable DOS, I anticipate pt can proceed as scheduled.   Willeen Cass, FNP-BC [facility]/Anesthesiology Phone: [phone] 02/26/2015 2:12 PM

## 2015-02-26 NOTE — Telephone Encounter (Signed)
Oncology Nurse Navigator Documentation  Oncology Nurse Navigator Flowsheets 02/26/2015  Referral date to RadOnc/MedOnc 02/26/2015  Navigator Encounter Type Introductory phone call/I received a referral from Dr. Servando Snare.  I set patient up for thoracic clinic after lung biopsy.  He verbalized understanding of appt time and place. 03/07/15 arrive at 12:30  Treatment Phase Abnormal Scans  Time Spent with Patient 15

## 2015-02-27 ENCOUNTER — Encounter (HOSPITAL_COMMUNITY): Payer: Self-pay | Admitting: *Deleted

## 2015-02-27 ENCOUNTER — Ambulatory Visit (HOSPITAL_COMMUNITY): Payer: Medicare Other | Admitting: Anesthesiology

## 2015-02-27 ENCOUNTER — Ambulatory Visit (HOSPITAL_COMMUNITY)
Admission: RE | Admit: 2015-02-27 | Discharge: 2015-02-27 | Disposition: A | Discharge status: Home / Self Care | Payer: Medicare Other | Source: Ambulatory Visit | Attending: Cardiothoracic Surgery | Admitting: Cardiothoracic Surgery

## 2015-02-27 ENCOUNTER — Ambulatory Visit (HOSPITAL_COMMUNITY): Payer: Medicare Other

## 2015-02-27 ENCOUNTER — Encounter (HOSPITAL_COMMUNITY)
Admission: RE | Disposition: A | Discharge status: Home / Self Care | Payer: Self-pay | Source: Ambulatory Visit | Attending: Cardiothoracic Surgery

## 2015-02-27 DIAGNOSIS — N400 Enlarged prostate without lower urinary tract symptoms: Secondary | ICD-10-CM | POA: Diagnosis not present

## 2015-02-27 DIAGNOSIS — I10 Essential (primary) hypertension: Secondary | ICD-10-CM | POA: Insufficient documentation

## 2015-02-27 DIAGNOSIS — E785 Hyperlipidemia, unspecified: Secondary | ICD-10-CM | POA: Insufficient documentation

## 2015-02-27 DIAGNOSIS — R918 Other nonspecific abnormal finding of lung field: Secondary | ICD-10-CM

## 2015-02-27 DIAGNOSIS — I509 Heart failure, unspecified: Secondary | ICD-10-CM | POA: Diagnosis not present

## 2015-02-27 DIAGNOSIS — R222 Localized swelling, mass and lump, trunk: Secondary | ICD-10-CM

## 2015-02-27 DIAGNOSIS — I252 Old myocardial infarction: Secondary | ICD-10-CM | POA: Insufficient documentation

## 2015-02-27 DIAGNOSIS — J449 Chronic obstructive pulmonary disease, unspecified: Secondary | ICD-10-CM | POA: Insufficient documentation

## 2015-02-27 DIAGNOSIS — C3411 Malignant neoplasm of upper lobe, right bronchus or lung: Secondary | ICD-10-CM | POA: Insufficient documentation

## 2015-02-27 DIAGNOSIS — I2510 Atherosclerotic heart disease of native coronary artery without angina pectoris: Secondary | ICD-10-CM | POA: Insufficient documentation

## 2015-02-27 DIAGNOSIS — F17210 Nicotine dependence, cigarettes, uncomplicated: Secondary | ICD-10-CM | POA: Diagnosis not present

## 2015-02-27 HISTORY — DX: Personal history of urinary calculi: Z87.442

## 2015-02-27 HISTORY — DX: Restless legs syndrome: G25.81

## 2015-02-27 HISTORY — PX: LUNG BIOPSY: SHX5088

## 2015-02-27 HISTORY — DX: Anxiety disorder, unspecified: F41.9

## 2015-02-27 HISTORY — PX: VIDEO BRONCHOSCOPY WITH ENDOBRONCHIAL ULTRASOUND: SHX6177

## 2015-02-27 HISTORY — DX: Reserved for inherently not codable concepts without codable children: IMO0001

## 2015-02-27 HISTORY — DX: Chronic obstructive pulmonary disease, unspecified: J44.9

## 2015-02-27 LAB — CBC
HCT: 45.2 % (ref 39.0–52.0)
Hemoglobin: 15.5 g/dL (ref 13.0–17.0)
MCH: 26.6 pg (ref 26.0–34.0)
MCHC: 34.3 g/dL (ref 30.0–36.0)
MCV: 77.5 fL — ABNORMAL LOW (ref 78.0–100.0)
Platelets: 180 K/uL (ref 150–400)
RBC: 5.83 MIL/uL — ABNORMAL HIGH (ref 4.22–5.81)
RDW: 16.6 % — ABNORMAL HIGH (ref 11.5–15.5)
WBC: 7.2 K/uL (ref 4.0–10.5)

## 2015-02-27 LAB — SURGICAL PCR SCREEN
MRSA, PCR: NEGATIVE
Staphylococcus aureus: NEGATIVE

## 2015-02-27 LAB — COMPREHENSIVE METABOLIC PANEL WITH GFR
ALT: 17 U/L (ref 17–63)
AST: 28 U/L (ref 15–41)
Albumin: 3.5 g/dL (ref 3.5–5.0)
Alkaline Phosphatase: 103 U/L (ref 38–126)
Anion gap: 12 (ref 5–15)
BUN: 11 mg/dL (ref 6–20)
CO2: 22 mmol/L (ref 22–32)
Calcium: 8.7 mg/dL — ABNORMAL LOW (ref 8.9–10.3)
Chloride: 102 mmol/L (ref 101–111)
Creatinine, Ser: 1.07 mg/dL (ref 0.61–1.24)
GFR calc Af Amer: >60 mL/min
GFR calc non Af Amer: >60 mL/min
Glucose, Bld: 108 mg/dL — ABNORMAL HIGH (ref 65–99)
Potassium: 4.3 mmol/L (ref 3.5–5.1)
Sodium: 136 mmol/L (ref 135–145)
Total Bilirubin: 0.7 mg/dL (ref 0.3–1.2)
Total Protein: 6.8 g/dL (ref 6.5–8.1)

## 2015-02-27 LAB — TYPE AND SCREEN
ABO/RH(D): O POS
Antibody Screen: NEGATIVE

## 2015-02-27 LAB — PROTIME-INR
INR: 1.04 (ref 0.00–1.49)
Prothrombin Time: 13.8 s (ref 11.6–15.2)

## 2015-02-27 LAB — APTT: aPTT: 23 s — ABNORMAL LOW (ref 24–37)

## 2015-02-27 LAB — ABO/RH: ABO/RH(D): O POS

## 2015-02-27 SURGERY — BRONCHOSCOPY, WITH EBUS
Anesthesia: General

## 2015-02-27 MED ORDER — FENTANYL CITRATE (PF) 100 MCG/2ML IJ SOLN
INTRAMUSCULAR | Status: DC | PRN
  Administered 2015-02-27: 100 ug via INTRAVENOUS
  Administered 2015-02-27: 50 ug via INTRAVENOUS
  Administered 2015-02-27: 100 ug via INTRAVENOUS

## 2015-02-27 MED ORDER — LIDOCAINE HCL (CARDIAC) 20 MG/ML IV SOLN
INTRAVENOUS | Status: DC | PRN
  Administered 2015-02-27: 40 mg via INTRAVENOUS
  Administered 2015-02-27: 60 mg via INTRAVENOUS

## 2015-02-27 MED ORDER — PROMETHAZINE HCL 25 MG/ML IJ SOLN: 6.2500 mg | INTRAMUSCULAR | Status: DC | PRN

## 2015-02-27 MED ORDER — FENTANYL CITRATE (PF) 100 MCG/2ML IJ SOLN: 25.0000 ug | INTRAMUSCULAR | Status: DC | PRN

## 2015-02-27 MED ORDER — GLYCOPYRROLATE 0.2 MG/ML IJ SOLN
INTRAMUSCULAR | Status: DC | PRN
  Administered 2015-02-27: 0.6 mg via INTRAVENOUS

## 2015-02-27 MED ORDER — PROPOFOL 10 MG/ML IV BOLUS
INTRAVENOUS | Status: AC
  Filled 2015-02-27: qty 20

## 2015-02-27 MED ORDER — VECURONIUM BROMIDE 10 MG IV SOLR
INTRAVENOUS | Status: DC | PRN
  Administered 2015-02-27: 8 mg via INTRAVENOUS

## 2015-02-27 MED ORDER — HYDROMORPHONE HCL 1 MG/ML IJ SOLN: 0.2500 mg | INTRAMUSCULAR | Status: DC | PRN

## 2015-02-27 MED ORDER — 0.9 % SODIUM CHLORIDE (POUR BTL) OPTIME
TOPICAL | Status: DC | PRN
  Administered 2015-02-27: 1000 mL

## 2015-02-27 MED ORDER — DEXAMETHASONE SODIUM PHOSPHATE 4 MG/ML IJ SOLN
INTRAMUSCULAR | Status: DC | PRN
  Administered 2015-02-27: 8 mg via INTRAVENOUS

## 2015-02-27 MED ORDER — NEOSTIGMINE METHYLSULFATE 10 MG/10ML IV SOLN
INTRAVENOUS | Status: DC | PRN
  Administered 2015-02-27: 5 mg via INTRAVENOUS

## 2015-02-27 MED ORDER — PROPOFOL 10 MG/ML IV BOLUS
INTRAVENOUS | Status: DC | PRN
  Administered 2015-02-27: 140 mg via INTRAVENOUS

## 2015-02-27 MED ORDER — CHLORHEXIDINE GLUCONATE 4 % EX LIQD: 1.0000 "application " | Freq: Once | CUTANEOUS | Status: DC

## 2015-02-27 MED ORDER — FENTANYL CITRATE (PF) 250 MCG/5ML IJ SOLN
INTRAMUSCULAR | Status: AC
  Filled 2015-02-27: qty 5

## 2015-02-27 MED ORDER — MIDAZOLAM HCL 5 MG/5ML IJ SOLN
INTRAMUSCULAR | Status: DC | PRN
  Administered 2015-02-27: 2 mg via INTRAVENOUS

## 2015-02-27 MED ORDER — SODIUM CHLORIDE 0.9 % IV SOLN
10.0000 mg | INTRAVENOUS | Status: DC | PRN
  Administered 2015-02-27: 10 ug/min via INTRAVENOUS

## 2015-02-27 MED ORDER — EPHEDRINE SULFATE 50 MG/ML IJ SOLN
INTRAMUSCULAR | Status: DC | PRN
  Administered 2015-02-27 (×2): 10 mg via INTRAVENOUS

## 2015-02-27 MED ORDER — EPINEPHRINE HCL 1 MG/ML IJ SOLN
INTRAMUSCULAR | Status: AC
  Filled 2015-02-27: qty 1

## 2015-02-27 MED ORDER — ONDANSETRON HCL 4 MG/2ML IJ SOLN
INTRAMUSCULAR | Status: DC | PRN
  Administered 2015-02-27: 4 mg via INTRAVENOUS

## 2015-02-27 MED ORDER — EPINEPHRINE HCL 1 MG/ML IJ SOLN
INTRAMUSCULAR | Status: DC | PRN
  Administered 2015-02-27: 1 mg via ENDOTRACHEOPULMONARY

## 2015-02-27 MED ORDER — PHENYLEPHRINE HCL 10 MG/ML IJ SOLN
INTRAMUSCULAR | Status: DC | PRN
  Administered 2015-02-27 (×2): 80 ug via INTRAVENOUS

## 2015-02-27 MED ORDER — ARTIFICIAL TEARS OP OINT
TOPICAL_OINTMENT | OPHTHALMIC | Status: DC | PRN
  Administered 2015-02-27: 1 via OPHTHALMIC

## 2015-02-27 MED ORDER — MEPERIDINE HCL 25 MG/ML IJ SOLN: 6.2500 mg | INTRAMUSCULAR | Status: DC | PRN

## 2015-02-27 MED ORDER — MIDAZOLAM HCL 2 MG/2ML IJ SOLN
INTRAMUSCULAR | Status: AC
  Filled 2015-02-27: qty 2

## 2015-02-27 MED ORDER — MUPIROCIN 2 % EX OINT
TOPICAL_OINTMENT | CUTANEOUS | Status: AC
Start: 2015-02-27 — End: 2015-02-27
  Administered 2015-02-27: 1
  Filled 2015-02-27: qty 22

## 2015-02-27 MED ORDER — LACTATED RINGERS IV SOLN
INTRAVENOUS | Status: DC | PRN
  Administered 2015-02-27: 08:00:00 via INTRAVENOUS

## 2015-02-27 SURGICAL SUPPLY — 51 items
BLADE SURG 10 STRL SS (BLADE) IMPLANT
BRUSH CYTOL CELLEBRITY 1.5X140 (MISCELLANEOUS) ×2 IMPLANT
CANISTER SUCTION 2500CC (MISCELLANEOUS) ×2 IMPLANT
CLIP TI MEDIUM 6 (CLIP) IMPLANT
CONT SPEC 4OZ CLIKSEAL STRL BL (MISCELLANEOUS) ×4 IMPLANT
COVER SURGICAL LIGHT HANDLE (MISCELLANEOUS) IMPLANT
COVER TABLE BACK 60X90 (DRAPES) ×2 IMPLANT
DERMABOND ADVANCED (GAUZE/BANDAGES/DRESSINGS)
DERMABOND ADVANCED .7 DNX12 (GAUZE/BANDAGES/DRESSINGS) IMPLANT
DRAPE LAPAROTOMY T 102X78X121 (DRAPES) IMPLANT
DRSG AQUACEL AG ADV 3.5X14 (GAUZE/BANDAGES/DRESSINGS) IMPLANT
ELECT CAUTERY BLADE 6.4 (BLADE) IMPLANT
ELECT REM PT RETURN 9FT ADLT (ELECTROSURGICAL)
ELECTRODE REM PT RTRN 9FT ADLT (ELECTROSURGICAL) IMPLANT
FILTER STRAW FLUID ASPIR (MISCELLANEOUS) ×2 IMPLANT
FORCEPS BIOP RJ4 1.8 (CUTTING FORCEPS) IMPLANT
FORCEPS RADIAL JAW LRG 4 PULM (INSTRUMENTS) ×1 IMPLANT
GAUZE SPONGE 4X4 12PLY STRL (GAUZE/BANDAGES/DRESSINGS) ×2 IMPLANT
GAUZE SPONGE 4X4 16PLY XRAY LF (GAUZE/BANDAGES/DRESSINGS) IMPLANT
GLOVE BIO SURGEON STRL SZ 6.5 (GLOVE) ×2 IMPLANT
GOWN STRL REUS W/ TWL LRG LVL3 (GOWN DISPOSABLE) IMPLANT
GOWN STRL REUS W/TWL LRG LVL3 (GOWN DISPOSABLE)
HEMOSTAT SURGICEL 2X14 (HEMOSTASIS) IMPLANT
KIT BASIN OR (CUSTOM PROCEDURE TRAY) IMPLANT
KIT CLEAN ENDO COMPLIANCE (KITS) ×4 IMPLANT
KIT ROOM TURNOVER OR (KITS) ×2 IMPLANT
MARKER SKIN DUAL TIP RULER LAB (MISCELLANEOUS) ×2 IMPLANT
NEEDLE 27GX1/2 REG BEVEL ECLIP (NEEDLE) ×2 IMPLANT
NEEDLE BIOPSY TRANSBRONCH 21G (NEEDLE) IMPLANT
NEEDLE BLUNT 18X1 FOR OR ONLY (NEEDLE) IMPLANT
NEEDLE SONO TIP II EBUS (NEEDLE) ×2 IMPLANT
NS IRRIG 1000ML POUR BTL (IV SOLUTION) ×2 IMPLANT
OIL SILICONE PENTAX (PARTS (SERVICE/REPAIRS)) ×2 IMPLANT
PACK SURGICAL SETUP 50X90 (CUSTOM PROCEDURE TRAY) ×2 IMPLANT
PAD ARMBOARD 7.5X6 YLW CONV (MISCELLANEOUS) ×4 IMPLANT
PENCIL BUTTON HOLSTER BLD 10FT (ELECTRODE) IMPLANT
RADIAL JAW LRG 4 PULMONARY (INSTRUMENTS) ×1
SPONGE INTESTINAL PEANUT (DISPOSABLE) IMPLANT
STAPLER VISISTAT 35W (STAPLE) IMPLANT
SUT VIC AB 3-0 SH 18 (SUTURE) IMPLANT
SUT VICRYL 4-0 PS2 18IN ABS (SUTURE) IMPLANT
SWAB COLLECTION DEVICE MRSA (MISCELLANEOUS) IMPLANT
SYR 20CC LL (SYRINGE) ×4 IMPLANT
SYR 20ML ECCENTRIC (SYRINGE) ×2 IMPLANT
SYRINGE 10CC LL (SYRINGE) IMPLANT
TOWEL OR 17X24 6PK STRL BLUE (TOWEL DISPOSABLE) ×2 IMPLANT
TOWEL OR 17X26 10 PK STRL BLUE (TOWEL DISPOSABLE) IMPLANT
TRAP SPECIMEN MUCOUS 40CC (MISCELLANEOUS) ×2 IMPLANT
TUBE ANAEROBIC SPECIMEN COL (MISCELLANEOUS) IMPLANT
TUBE CONNECTING 12X1/4 (SUCTIONS) ×4 IMPLANT
WATER STERILE IRR 1000ML POUR (IV SOLUTION) IMPLANT

## 2015-02-27 NOTE — Discharge Instructions (Signed)
Flexible Bronchoscopy, Care After ° °Refer to this sheet in the next few weeks. These instructions provide you with information on caring for yourself after your procedure. Your health care provider may also give you more specific instructions. Your treatment has been planned according to current medical practices, but problems sometimes occur. Call your health care provider if you have any problems or questions after your procedure.  °WHAT TO EXPECT AFTER THE PROCEDURE °It is normal to have the following symptoms for 24-48 hours after the procedure:  °· Increased cough. °· Low-grade fever. °· Sore throat or hoarse voice. °· Small streaks of blood in your thick spit (sputum) if tissue samples were taken (biopsy). °HOME CARE INSTRUCTIONS  °· Do not eat or drink anything for 2 hours after your procedure. Your nose and throat were numbed by medicine. If you try to eat or drink before the medicine wears off, food or drink could go into your lungs or you could burn yourself. After the numbness is gone and your cough and gag reflexes have returned, you may eat soft food and drink liquids slowly.   °· The day after the procedure, you can go back to your normal diet.   °· You may resume normal activities.   °· Keep all follow-up visits as directed by your health care provider. It is important to keep all your appointments, especially if tissue samples were taken for testing (biopsy). °SEEK IMMEDIATE MEDICAL CARE IF:  °· You have increasing shortness of breath.   °· You become light-headed or faint.   °· You have chest pain.   °· You have any new concerning symptoms. °· You cough up more than a small amount of blood. °· The amount of blood you cough up increases. °MAKE SURE YOU: °· Understand these instructions. °· Will watch your condition. °· Will get help right away if you are not doing well or get worse. °Document Released: 03/13/2005 Document Revised: 01/08/2014 Document Reviewed: 04/28/2013 °ExitCare® Patient  Information ©2015 [employer]. This information is not intended to replace advice given to you by your health care provider. Make sure you discuss any questions you have with your health care provider. ° °

## 2015-02-27 NOTE — H&P (Signed)
[address]       ,[address] [address]             [phone]                    Colton Nguyen [place] Medical Record #[id number] Date of Birth: 04/18/1946  Referring: No ref. provider found Primary Care: [name], MD  Chief Complaint:    No chief complaint on file.   History of Present Illness:    Colton Nguyen 69 y.o. male is seen in the office  today for new finding of right lung and hilar mass. The patient had noted increasing shortness of breath with exertion and was referred to Dr. Chancy Milroy. To evaluate for potential coronary artery disease a cardiac CT was performed that demonstrated some coronary artery disease but a spiculated mass was noted in the right lung. Since seen last week patient has had PET Scan and MRI of the brain. Current Activity/ Functional Status:  Patient is independent with mobility/ambulation, transfers, ADL's, IADL's.   Zubrod Score: At the time of surgery this patient's most appropriate activity status/level should be described as: '[]'$     0    Normal activity, no symptoms '[x]'$     1    Restricted in physical strenuous activity but ambulatory, able to do out light work '[]'$     2    Ambulatory and capable of self care, unable to do work activities, up and about               >50 % of waking hours                              '[]'$     3    Only limited self care, in bed greater than 50% of waking hours '[]'$     4    Completely disabled, no self care, confined to bed or chair '[]'$     5    Moribund   Past Medical History  Diagnosis Date  . CHF (congestive heart failure)   . Hypertension   . Hyperlipidemia   . BPH (benign prostatic hyperplasia)   . CAD (coronary artery disease)   . Chronic cardiopulmonary disease   . Nicotine dependence   . Mass of lung   . COPD (chronic obstructive pulmonary disease)   . Shortness of breath dyspnea     with exertion  . Anxiety     situational  . Personal history of kidney stones    . Restless leg     Past Surgical History  Procedure Laterality Date  . Cad ccta 01/31/15    . Right inguinal hernia repair at age 89    . Colonoscopy    . Cervical fusion    . 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 Cancer    . Heart disease Father Patient's father died at age 75 of lung cancer  . Hypertension    Patient's mother died at age 84 with respiratory failure He has one brother who is had a stroke, one sister with severe diabetes,  myocardial infarction x 5 and bilateral amputations  History   Social History  . Marital Status: Divorced    Spouse Name: N/A  . Number of  Children: 1  . Years of Education: N/A   Occupational History  . 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 Smoking status: Current Every Day Smoker -- 2.00 packs/day for 47 years    Types: Cigarettes    Start date: 02/14/1967  . Smokeless tobacco: Never Used  . Alcohol Use: 0.0 oz/week    0 Standard drinks or equivalent per week  . Drug Use: No  . Sexual Activity: Not on file          Social History Narrative  . Patient lives alone, does have one son who is autistic.    History  Smoking status  . Current Every Day Smoker -- 2.00 packs/day for 47 years  . Types: Cigarettes  . Start date: 02/14/1967  Smokeless tobacco  . Never Used    History  Alcohol Use  . 0.0 oz/week  . 0 Standard drinks or equivalent per week    Comment: ocassional     No Known Allergies  Current Facility-Administered Medications  Medication Dose Route Frequency Provider Last Rate Last Dose  . cefUROXime (ZINACEF) 1.5 g in dextrose 5 % 50 mL IVPB  1.5 g Intravenous To SS-Surg Grace Isaac, MD      . chlorhexidine (HIBICLENS) 4 % liquid 1 application  1 application Topical Once  Grace Isaac, MD      . mupirocin ointment (BACTROBAN) 2 %               Review of Systems:     Cardiac Review of Systems: Y or N  Chest Pain [  n  ]  Resting SOB [n   ] Exertional SOB  [ y ]  Vertell Limber Florencio.Farrier  ]   Pedal Edema [ n  ]    Palpitations [n  ] Syncope  Florencio.Farrier ]   Presyncope [ n  ]  General Review of Systems: [Y] = yes [  ]=no Constitional: recent weight change [ n ];  Wt loss over the last 3 months [   ] anorexia [  ]; fatigue [ y ]; nausea [  ]; night sweats [ n ]; fever [ n]; or chills [ n ];          Dental: poor dentition[  ]; Last Dentist visit:   Eye : blurred vision [  ]; diplopia [   ]; vision changes [  ];  Amaurosis fugax[  ]; Resp: cough [  ];  wheezing[ n ];  hemoptysis[n  ]; shortness of breath[ y ]; paroxysmal nocturnal dyspnea[y  ]; dyspnea on exertion[y  ]; or orthopnea[y  ];  GI:  gallstones[ n ], vomiting[  n];  dysphagia[n  ]; melena[  ];  hematochezia [  ]; heartburn[  ];   Hx of  Colonoscopy[ y ]; GU: kidney stones [  ]; hematuria[  ];   dysuria [  ];  nocturia[  ];  history of     obstruction [  ]; urinary frequency [  ]             Skin: rash, swelling[  ];, hair loss[  ];  peripheral edema[  ];  or itching[  ]; Musculosketetal: myalgias[  ];  joint swelling[  ];  joint erythema[  ];  joint pain[  ];  back pain[  ];  Heme/Lymph: bruising[  ];  bleeding[  ];  anemia[  ];  Neuro: TIA[ n ];  headaches[  ];  stroke[  ];  vertigo[  ];  seizures[  ];   paresthesias[  ];  difficulty walking[y  ];  Psych:depression[  ]; anxiety[  ];  Endocrine: diabetes[  ];  thyroid dysfunction[  ];  Immunizations: Flu up to date Blue.Reese  ]; Pneumococcal up to date Florencio.Farrier  ];  Other:  Physical Exam: BP 159/77 mmHg  Pulse 66  Temp(Src) 98.1 F (36.7 C) (Oral)  Resp 16  Wt 204 lb (92.534 kg)  SpO2 98%  PHYSICAL EXAMINATION: General appearance: alert, cooperative and appears stated age Head: Normocephalic, without obvious abnormality, atraumatic Neck: no adenopathy, no carotid  bruit, no JVD, supple, symmetrical, trachea midline and thyroid not enlarged, symmetric, no tenderness/mass/nodules Lymph nodes: Cervical, supraclavicular, and axillary nodes normal. Resp: clear to auscultation bilaterally Back: symmetric, no curvature. ROM normal. No CVA tenderness. Cardio: regular rate and rhythm, S1, S2 normal, no murmur, click, rub or gallop GI: soft, non-tender; bowel sounds normal; no masses,  no organomegaly Extremities: extremities normal, atraumatic, no cyanosis or edema and Homans sign is negative, no sign of DVT Neurologic: Gait: Drop-foot right  Diagnostic Studies & Laboratory data:     Recent Radiology Findings:   CT done in  Cardiac CT: Calcium score 125.6 Right dominant system Mid lad mild to moderate disease, crx and rco OK.   Irregular mass in the right upper lobe medially abutting the mediastinum at the inferior aspect of the mass there is extension into the right hilar region the mass measures 4.5 x 4.0 x 7.5 cm there is also a small nodule In the superior segment of the right lower lobe posteriorly measuring 1 cm suspicious for a second primary or metastatic nodule there is no evidence of pleural effusion., Is questionable enlargement of the adrenal glands.   I have independently reviewed the above radiologic studies.  PET: Mr Kizzie Fantasia Contrast  02/21/2015   CLINICAL DATA:  Possible right upper lobe lung cancer on recent chest CT and PET-CT. Headache. Evaluation for metastatic disease.  EXAM: MRI HEAD WITHOUT AND WITH CONTRAST  TECHNIQUE: Multiplanar, multiecho pulse sequences of the brain and surrounding structures were obtained without and with intravenous contrast.  CONTRAST:  40m MULTIHANCE GADOBENATE DIMEGLUMINE 529 MG/ML IV SOLN  COMPARISON:  None.  FINDINGS: There is no evidence of acute infarct, intracranial hemorrhage, mass, midline shift, or extra-axial fluid collection. Ventricles and sulci are within normal limits for age. No  significant white matter disease is identified. No abnormal enhancement is identified.  Visualized upper cervical spine partially demonstrates artifact from prior anterior fusion as well as a broad-based posterior disc osteophyte complex at C3-4 resulting in at least a mild impression on the spinal cord. Moderate mucosal thickening and bubbly secretions are present in the left maxillary sinus. There is mild right frontal sinus mucosal thickening. Trace right mastoid fluid is noted. Major intracranial vascular flow voids are preserved.  IMPRESSION: 1. Unremarkable appearance of the brain for age. No evidence of intracranial metastases. 2. Left maxillary sinus mucosal thickening and fluid. Correlate clinically for acute sinusitis.   Electronically Signed   By: ALogan Bores  On: 02/21/2015 14:16   Nm Pet Image Initial (pi) Skull Base To Thigh  02/20/2015   CLINICAL DATA:  Initial treatment strategy for lung mass.  EXAM: NUCLEAR MEDICINE PET SKULL BASE TO THIGH  TECHNIQUE: 13.0 mCi F-18 FDG was injected intravenously. Full-ring PET imaging was performed from the skull base to thigh  after the radiotracer. CT data was obtained and used for attenuation correction and anatomic localization.  FASTING BLOOD GLUCOSE:  Value: 100 mg/dl  COMPARISON:  Outside CT 02/04/2015  FINDINGS: NECK  No hypermetabolic lymph nodes in the neck.  CHEST  There is ill-defined thickened tissue extending from the superior aspect of the right hilum along the right upper lobe bronchus to the medial aspect of the a right upper lobe. This thickening measures 32 by 27 mm in axial dimension on image 80 series 3 with SUV max 7.3.  There is a hypermetabolic focus at the right hilum which likely corresponds to soft tissue (image 95 measuring 17 mm. This is more intense than the upper lobe peribronchial thickening with SUV max 11.9  Within the superior aspect of the right lower lobe 9 mm nodule on image 91, series 3 is hypermetabolic with SUV max 4.2.   There is moderate metabolic activity associated with right lower paratracheal lymph node which is enlarged to 15 mm on image 93 series 3 with SUV max 3.8.  ABDOMEN/PELVIS  No abnormal metabolic activity the liver. 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 No hypermetabolic abdominal pelvic adenopathy. Prostate gland is not significantly enlarged to 6.5 cm. Soft tissue within the right inguinal region without associated metabolic activity  SKELETON  No focal hypermetabolic activity to suggest skeletal metastasis.  IMPRESSION: 1. Hypermetabolic peribronchial thickening in the superior right hilum extending into the right upper lobe is consists with bronchogenic carcinoma versus postobstructive pneumonitis. Favor malignancy. 2. Hypermetabolic nodal at the right hilum versus primary lesion. 3. Moderately Hypermetabolic right lower paratracheal lymph node suspicious for ipsilateral mediastinal metastasis. 4. Hypermetabolic nodule within the superior segment of the right lower lobe is concerning for metastatic disease versus second primary lesion.   Electronically Signed   By: Suzy Bouchard M.D.   On: 02/20/2015 13:04     Recent Lab Findings: Lab Results  Component Value Date   WBC 5.7 07/30/2010   HGB 15.2 07/30/2010   HCT 44.3 07/30/2010   PLT 224 07/30/2010   GLUCOSE 102* 07/30/2010   NA 138 07/30/2010   K 4.3 07/30/2010   CL 108 07/30/2010   CREATININE 1.11 07/30/2010   BUN 10 07/30/2010   CO2 24 07/30/2010   INR 1.02 07/30/2010      Assessment / Plan:   Patient with new diagnosis of right lung mass with mediastinal involvement advanced stage at least  Stage IIIa/b or stage IV . I discussed with the patient the findings of the PET and suggested to him proceeding with further diagnostic studies including and proceed with bronchoscopy and EBUS with biopsy , posible mediastinoscopy.  His neighbor is a current patient of the [facility] at [facility] and he would like to be treated there.   The goals risks and alternatives of the planned surgical procedure  bronchoscopy and EBUS with biopsy , possible mediastinoscopy   have been discussed with the patient in detail. The risks of the procedure including death, infection, stroke, myocardial infarction, bleeding, blood transfusion have all been discussed specifically.  I have quoted Hermelinda Dellen Groh a 1 % of perioperative mortality and a complication rate as high as 20 %. The patient's questions have been answered.Bobie Kistler Fobes is willing  to proceed with the planned procedure.    Grace Isaac MD      [address] [place],[address] [address] Office [phone]   Beeper [phone]  02/27/2015 6:58 AM

## 2015-02-27 NOTE — Anesthesia Preprocedure Evaluation (Addendum)
Anesthesia Evaluation  Patient identified by MRN, date of birth, ID band Patient awake    Reviewed: Allergy & Precautions, NPO status , Patient's Chart, lab work & pertinent test results  History of Anesthesia Complications Negative for: history of anesthetic complications  Airway Mallampati: III  TM Distance: >3 FB Neck ROM: Full    Dental no notable dental hx. (+) Edentulous Upper, Missing,    Pulmonary shortness of breath, COPD COPD inhaler, Current Smoker,  breath sounds clear to auscultation  Pulmonary exam normal       Cardiovascular hypertension, Pt. on medications - angina+ CAD and +CHF - Past MI Normal cardiovascular exam- dysrhythmias Rhythm:Regular Rate:Normal     Neuro/Psych PSYCHIATRIC DISORDERS Anxiety negative neurological ROS     GI/Hepatic negative GI ROS, Neg liver ROS,   Endo/Other  negative endocrine ROS  Renal/GU negative Renal ROS     Musculoskeletal negative musculoskeletal ROS (+)   Abdominal   Peds  Hematology negative hematology ROS (+)   Anesthesia Other Findings   Reproductive/Obstetrics                            Anesthesia Physical Anesthesia Plan  ASA: III  Anesthesia Plan: General   Post-op Pain Management:    Induction: Intravenous  Airway Management Planned: Oral ETT  Additional Equipment: None  Intra-op Plan:   Post-operative Plan: Extubation in OR  Informed Consent: I have reviewed the patients History and Physical, chart, labs and discussed the procedure including the risks, benefits and alternatives for the proposed anesthesia with the patient or authorized representative who has indicated his/her understanding and acceptance.   Dental advisory given  Plan Discussed with: CRNA and Surgeon  Anesthesia Plan Comments:        Anesthesia Quick Evaluation

## 2015-02-27 NOTE — Anesthesia Postprocedure Evaluation (Signed)
  Anesthesia Post-op Note  Patient: Colton Nguyen  Procedure(s) Performed: Procedure(s): VIDEO BRONCHOSCOPY WITH ENDOBRONCHIAL ULTRASOUND (N/A) LUNG BIOPSY (N/A)  Patient Location: PACU  Anesthesia Type:General  Level of Consciousness: awake  Airway and Oxygen Therapy: Patient Spontanous Breathing  Post-op Pain: none  Post-op Assessment: Post-op Vital signs reviewed, Patient's Cardiovascular Status Stable, Respiratory Function Stable, Patent Airway, No signs of Nausea or vomiting and Pain level controlled              Post-op Vital Signs: Reviewed and stable  Last Vitals:  Filed Vitals:   02/27/15 1206  BP: 105/63  Pulse: 64  Temp:   Resp:     Complications: No apparent anesthesia complications

## 2015-02-27 NOTE — Brief Op Note (Signed)
      [address]       [place] [address]             [phone]      02/27/2015  10:01 AM  PATIENT:  Colton Nguyen  69 y.o. 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   Delay start of Pharmacological VTE agent (>24hrs) due to surgical blood loss or risk of bleeding: yes

## 2015-02-27 NOTE — Transfer of Care (Signed)
Immediate Anesthesia Transfer of Care Note  Patient: Colton Nguyen  Procedure(s) Performed: Procedure(s): VIDEO BRONCHOSCOPY WITH ENDOBRONCHIAL ULTRASOUND (N/A) LUNG BIOPSY (N/A)  Patient Location: PACU  Anesthesia Type:General  Level of Consciousness: oriented, sedated, patient cooperative and responds to stimulation  Airway & Oxygen Therapy: Patient Spontanous Breathing and Patient connected to face mask oxygen  Post-op Assessment: Report given to RN, Post -op Vital signs reviewed and stable, Patient moving all extremities and Patient moving all extremities X 4  Post vital signs: Reviewed and stable  Last Vitals:  Filed Vitals:   02/27/15 1001  BP:   Pulse: 73  Temp: 36.6 C  Resp: 16    Complications: No apparent anesthesia complications

## 2015-02-27 NOTE — Anesthesia Procedure Notes (Signed)
Procedure Name: Intubation Date/Time: 02/27/2015 8:46 AM Performed by: Jacquiline Doe A Pre-anesthesia Checklist: Patient identified, Timeout performed, Emergency Drugs available, Suction available and Patient being monitored Patient Re-evaluated:Patient Re-evaluated prior to inductionOxygen Delivery Method: Circle system utilized Preoxygenation: Pre-oxygenation with 100% oxygen Intubation Type: IV induction and Cricoid Pressure applied Ventilation: Mask ventilation without difficulty and Oral airway inserted - appropriate to patient size Laryngoscope Size: Mac and 4 Grade View: Grade I Tube type: Oral Tube size: 8.5 mm Number of attempts: 1 Airway Equipment and Method: Stylet Placement Confirmation: ETT inserted through vocal cords under direct vision,  breath sounds checked- equal and bilateral and positive ETCO2 Secured at: 23 cm Tube secured with: Tape Dental Injury: Teeth and Oropharynx as per pre-operative assessment

## 2015-02-28 ENCOUNTER — Encounter (HOSPITAL_COMMUNITY): Payer: Self-pay | Admitting: Cardiothoracic Surgery

## 2015-02-28 NOTE — Op Note (Signed)
Colton Nguyen, HSIUNG NO.:  [id number]  MEDICAL RECORD NO.:  [id number]  LOCATION:  [facility]                         FACILITY:  [facility]  PHYSICIAN:  Lanelle Bal, MD    DATE OF BIRTH:  [birth year]  DATE OF PROCEDURE:  02/27/2015 DATE OF DISCHARGE:  02/27/2015                              OPERATIVE REPORT   PREOPERATIVE DIAGNOSIS:  Right lung mass.  POSTOPERATIVE DIAGNOSIS:  Right lung mass.  SURGICAL PROCEDURE:  Bronchoscopy with biopsy and endoscopic ultrasound of the bronchus with biopsy of 4R nodes.  SURGEON:  Lanelle Bal, MD  BRIEF HISTORY:  The patient is a 69 year old long-term smoker, who presented with increasing shortness of breath.  A CT scan of the chest to evaluate for possible coronary artery disease, revealed a right lung mass with probable nodal involvement.  PET scan was performed and MRI of the brain, there was no evidence of brain metastasis.  There was hypometabolic area in the right hilar area extending along the right side of the trachea, consistent with advanced stage carcinoma of the lung to obtain tissue diagnosis of this primarily centrally-located lesions, bronchoscopy with biopsy and EBUS, possible mediastinoscopy was recommended to the patient, who agreed and signed informed consent.  DESCRIPTION OF PROCEDURE:  The patient underwent general endotracheal anesthesia without incident.  After appropriate time-out was performed, fiberoptic bronchoscope was passed through the endotracheal tube.  The left tracheobronchial tree was examined to the subsegmental level without evidence of endobronchial lesion.  On the right side, there was an obvious mass growing out of the right upper lobe bronchus nearly obstructing.  The right middle lobe bronchus, bronchus intermedius and lower lobe bronchus were without obstruction.  Brushings of the right upper lobe bronchus orifice were obtained and submitted for quick stain and were consistent with  probable non-small cell carcinoma.  We have proceeded with biopsy forceps and obtained further tissue for examination, was submitted for permanent review.  A big biopsy forceps was also used to take larger pieces of the mass.  Topical epinephrine solution, diluted, was used to control minor oozing.  The scope was removed and EBUS scope was placed along the tracheal area just above the carina.  4R lymph nodes were identified and several passes of the needle biopsy were performed and submitted with a cell block.  With sufficient tissue to make a tissue diagnosis, the scopes were then removed.  The patient was awakened and extubated in the operating room and transferred to the recovery room for further postoperative care.  He tolerated the procedure without obvious complication.     Lanelle Bal, MD     EG/MEDQ  D:  02/27/2015  T:  02/28/2015  Job:  [id number]

## 2015-03-06 ENCOUNTER — Telehealth: Payer: Self-pay | Admitting: *Deleted

## 2015-03-06 NOTE — Telephone Encounter (Signed)
Called pt w/ friendly reminder and confirmed clinic appt for 6/30.

## 2015-03-07 ENCOUNTER — Encounter: Payer: Self-pay | Admitting: Internal Medicine

## 2015-03-07 ENCOUNTER — Ambulatory Visit
Admission: RE | Admit: 2015-03-07 | Discharge: 2015-03-07 | Disposition: A | Discharge status: Home / Self Care | Payer: Medicare Other | Source: Ambulatory Visit | Attending: Radiation Oncology | Admitting: Radiation Oncology

## 2015-03-07 ENCOUNTER — Ambulatory Visit (HOSPITAL_BASED_OUTPATIENT_CLINIC_OR_DEPARTMENT_OTHER): Payer: Medicare Other | Admitting: Internal Medicine

## 2015-03-07 ENCOUNTER — Ambulatory Visit: Payer: Medicare Other | Attending: Internal Medicine | Admitting: Physical Therapy

## 2015-03-07 ENCOUNTER — Other Ambulatory Visit: Payer: Medicare Other

## 2015-03-07 ENCOUNTER — Telehealth: Payer: Self-pay | Admitting: Internal Medicine

## 2015-03-07 ENCOUNTER — Encounter: Payer: Self-pay | Admitting: *Deleted

## 2015-03-07 ENCOUNTER — Encounter: Payer: Self-pay | Admitting: Radiation Oncology

## 2015-03-07 VITALS — BP 147/73 | HR 70 | Temp 98.6°F | Resp 19 | Ht 68.0 in | Wt 198.1 lb

## 2015-03-07 VITALS — BP 139/70 | HR 57 | Temp 98.0°F | Resp 16 | Ht 68.0 in | Wt 198.0 lb

## 2015-03-07 DIAGNOSIS — Z9181 History of falling: Secondary | ICD-10-CM | POA: Diagnosis not present

## 2015-03-07 DIAGNOSIS — C3411 Malignant neoplasm of upper lobe, right bronchus or lung: Secondary | ICD-10-CM | POA: Diagnosis present

## 2015-03-07 DIAGNOSIS — C3491 Malignant neoplasm of unspecified part of right bronchus or lung: Secondary | ICD-10-CM

## 2015-03-07 DIAGNOSIS — R05 Cough: Secondary | ICD-10-CM | POA: Diagnosis not present

## 2015-03-07 DIAGNOSIS — Z7982 Long term (current) use of aspirin: Secondary | ICD-10-CM | POA: Insufficient documentation

## 2015-03-07 DIAGNOSIS — R29898 Other symptoms and signs involving the musculoskeletal system: Secondary | ICD-10-CM | POA: Diagnosis not present

## 2015-03-07 DIAGNOSIS — Z510 Encounter for antineoplastic radiation therapy: Secondary | ICD-10-CM | POA: Diagnosis present

## 2015-03-07 DIAGNOSIS — R918 Other nonspecific abnormal finding of lung field: Secondary | ICD-10-CM

## 2015-03-07 MED ORDER — PROCHLORPERAZINE MALEATE 10 MG PO TABS
10.0000 mg | ORAL_TABLET | Freq: Four times a day (QID) | ORAL | Status: DC | PRN
Start: 2015-03-07 — End: 2015-11-14

## 2015-03-07 NOTE — Progress Notes (Signed)
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 Primary cancer of right upper lobe of lung 162.3 C34.11     HISTORY OF PRESENT ILLNESS:Colton Nguyen is a 69 y.o. gentleman who is presenting to clinic in regards to his spiculated right upper lobe mass. Demo's case was discussed in lung clinic on 03/07/2015. He vocalized questions in regard to his treatment options, and these questions were addressed entirley.  Cardiology in [place] noted lung opacity 02/04/2015 chest CT   02/20/2015 PET-CT   02/28/2015 - Bronchoscopy with biopsy and endoscopic ultrasound of the bronchus with biopsy of 4R nodes   PREVIOUS RADIATION THERAPY: No  PAST MEDICAL HISTORY:  has a past medical history of CHF (congestive heart failure); Hypertension; Hyperlipidemia; BPH (benign prostatic hyperplasia); CAD (coronary artery disease); Chronic cardiopulmonary disease; Nicotine dependence; Mass of lung; COPD (chronic obstructive pulmonary disease); Shortness of breath dyspnea; Anxiety; Personal history of kidney stones; and Restless leg.    PAST SURGICAL HISTORY: Past Surgical History  Procedure Laterality Date  . Cad ccta 01/31/15    . Right inguinal hernia repair at age 82    . Colonoscopy    . Cervical fusion    . Back surgery    . Video bronchoscopy with endobronchial ultrasound N/A 02/27/2015    Procedure: VIDEO BRONCHOSCOPY WITH ENDOBRONCHIAL ULTRASOUND;  Surgeon: Grace Isaac, MD;  Location: [facility];  Service: Thoracic;  Laterality: N/A;  . Lung biopsy N/A 02/27/2015    Procedure:  LUNG BIOPSY;  Surgeon: Grace Isaac, MD;  Location: [place];  Service: Thoracic;  Laterality: N/A;    FAMILY HISTORY: family history includes Cancer in an other family member; Heart disease in his father; Hypertension in an other family member.  SOCIAL HISTORY:  reports that he has been smoking Cigarettes.  He started smoking about 48 years ago. He has a 94 pack-year smoking history. He has never used smokeless tobacco. He reports that he drinks alcohol. He reports that he does not use illicit drugs.  ALLERGIES: Review of patient's allergies indicates no known allergies.  MEDICATIONS:  Current Outpatient Prescriptions  Medication Sig Dispense Refill  . amLODipine (NORVASC) 10 MG tablet Take 10 mg by mouth daily.     Marland Kitchen aspirin 81 MG tablet Take 81 mg by mouth daily.    Marland Kitchen atorvastatin (LIPITOR) 40 MG tablet Take 40 mg by mouth daily.     Marland Kitchen BYSTOLIC 20 MG TABS Take 20 mg by mouth daily.     . CHANTIX STARTING MONTH PAK 0.5 MG X 11 & 1 MG X 42 tablet Take 0.5-1 mg by mouth as directed.     . Cholecalciferol (VITAMIN D3) 5000 UNITS TABS Take 5,000 Units by mouth daily.    . finasteride (PROSCAR) 5 MG tablet Take 5 mg by mouth daily.     Marland Kitchen losartan (COZAAR) 100 MG tablet Take 100 mg by mouth daily.     Marland Kitchen PROAIR HFA 108 (90 BASE) MCG/ACT inhaler Inhale 2 puffs into the lungs every 4 (four) hours as needed for wheezing or shortness of breath.     Marland Kitchen  prochlorperazine (COMPAZINE) 10 MG tablet Take 1 tablet (10 mg total) by mouth every 6 (six) hours as needed for nausea or vomiting. 30 tablet 0  . tamsulosin (FLOMAX) 0.4 MG CAPS capsule Take 0.4 mg by mouth daily.      No current facility-administered medications for this encounter.    REVIEW OF SYSTEMS:  A 15 point review of systems is documented in the electronic medical record. This was obtained by the nursing staff. However, I reviewed this with the patient to discuss relevant findings and make appropriate changes.  Pertinent items are noted in  HPI.   PHYSICAL EXAM:  The patient was alert and oriented X3. Per thoracic surgery: General appearance: alert, cooperative and appears stated age Head: Normocephalic, without obvious abnormality, atraumatic Neck: no adenopathy, no carotid bruit, no JVD, supple, symmetrical, trachea midline and thyroid not enlarged, symmetric, no tenderness/mass/nodules Lymph nodes: Cervical, supraclavicular, and axillary nodes normal. Resp: clear to auscultation bilaterally Back: symmetric, no curvature. ROM normal. No CVA tenderness. Cardio: regular rate and rhythm, S1, S2 normal, no murmur, click, rub or gallop GI: soft, non-tender; bowel sounds normal; no masses,  no organomegaly Extremities: extremities normal, atraumatic, no cyanosis or edema and Homans sign is negative, no sign of DVT Neurologic: Gait: Drop-foot right   KPS = 100  100 - Normal; no complaints; no evidence of disease. 90   - Able to carry on normal activity; minor signs or symptoms of disease. 80   - Normal activity with effort; some signs or symptoms of disease. 62   - Cares for self; unable to carry on normal activity or to do active work. 60   - Requires occasional assistance, but is able to care for most of his personal needs. 50   - Requires considerable assistance and frequent medical care. 31   - Disabled; requires special care and assistance. 70   - Severely disabled; hospital admission is indicated although death not imminent. 21   - Very sick; hospital admission necessary; active supportive treatment necessary. 10   - Moribund; fatal processes progressing rapidly. 0     - Dead  Karnofsky DA, Abelmann [place], Craver LS and Burchenal [facility] [phone]) The use of the nitrogen mustards in the palliative treatment of carcinoma: with particular reference to bronchogenic carcinoma Cancer 1 [id number]  LABORATORY DATA:  Lab Results  Component Value Date   WBC 7.2 02/27/2015   HGB 15.5 02/27/2015   HCT 45.2 02/27/2015   MCV 77.5* 02/27/2015   PLT  180 02/27/2015   Lab Results  Component Value Date   NA 136 02/27/2015   K 4.3 02/27/2015   CL 102 02/27/2015   CO2 22 02/27/2015   Lab Results  Component Value Date   ALT 17 02/27/2015   AST 28 02/27/2015   ALKPHOS 103 02/27/2015   BILITOT 0.7 02/27/2015    PULMONARY FUNCTION TEST:  Not applicable   RADIOGRAPHY: Dg Chest 2 View  02/27/2015   CLINICAL DATA:  Initial encounter for preop respiratory evaluation right lung mass.  EXAM: CHEST  2 VIEW  COMPARISON:  PET-CT from 02/20/2015.  FINDINGS: Two-view exam of the chest shows hyperexpansion. Right suprahilar opacity is compatible with patient's known lung lesion. 10 mm pulmonary nodule is evident in the right upper lobe. Is no pulmonary edema or focal airspace consolidation. No pleural effusion. The cardiopericardial silhouette is within normal limits for size. Imaged bony structures of the thorax are intact.  IMPRESSION: Right suprahilar mass with adjacent right upper lobe pulmonary  nodule.  Hyperinflation.   Electronically Signed   By: Misty Stanley M.D.   On: 02/27/2015 07:01   Mr Jeri Cos GX Contrast  02/21/2015   CLINICAL DATA:  Possible right upper lobe lung cancer on recent chest CT and PET-CT. Headache. Evaluation for metastatic disease.  EXAM: MRI HEAD WITHOUT AND WITH CONTRAST  TECHNIQUE: Multiplanar, multiecho pulse sequences of the brain and surrounding structures were obtained without and with intravenous contrast.  CONTRAST:  79m MULTIHANCE GADOBENATE DIMEGLUMINE 529 MG/ML IV SOLN  COMPARISON:  None.  FINDINGS: There is no evidence of acute infarct, intracranial hemorrhage, mass, midline shift, or extra-axial fluid collection. Ventricles and sulci are within normal limits for age. No significant white matter disease is identified. No abnormal enhancement is identified.  Visualized upper cervical spine partially demonstrates artifact from prior anterior fusion as well as a broad-based posterior disc osteophyte complex at C3-4  resulting in at least a mild impression on the spinal cord. Moderate mucosal thickening and bubbly secretions are present in the left maxillary sinus. There is mild right frontal sinus mucosal thickening. Trace right mastoid fluid is noted. Major intracranial vascular flow voids are preserved.  IMPRESSION: 1. Unremarkable appearance of the brain for age. No evidence of intracranial metastases. 2. Left maxillary sinus mucosal thickening and fluid. Correlate clinically for acute sinusitis.   Electronically Signed   By: ALogan Bores  On: 02/21/2015 14:16   Nm Pet Image Initial (pi) Skull Base To Thigh  02/20/2015   CLINICAL DATA:  Initial treatment strategy for lung mass.  EXAM: NUCLEAR MEDICINE PET SKULL BASE TO THIGH  TECHNIQUE: 13.0 mCi F-18 FDG was injected intravenously. Full-ring PET imaging was performed from the skull base to thigh after the radiotracer. CT data was obtained and used for attenuation correction and anatomic localization.  FASTING BLOOD GLUCOSE:  Value: 100 mg/dl  COMPARISON:  Outside CT 02/04/2015  FINDINGS: NECK  No hypermetabolic lymph nodes in the neck.  CHEST  There is ill-defined thickened tissue extending from the superior aspect of the right hilum along the right upper lobe bronchus to the medial aspect of the a right upper lobe. This thickening measures 32 by 27 mm in axial dimension on image 80 series 3 with SUV max 7.3.  There is a hypermetabolic focus at the right hilum which likely corresponds to soft tissue (image 95 measuring 17 mm. This is more intense than the upper lobe peribronchial thickening with SUV max 11.9  Within the superior aspect of the right lower lobe 9 mm nodule on image 91, series 3 is hypermetabolic with SUV max 4.2.  There is moderate metabolic activity associated with right lower paratracheal lymph node which is enlarged to 15 mm on image 93 series 3 with SUV max 3.8.  ABDOMEN/PELVIS  No abnormal metabolic activity the liver. There bilateral adrenal adenomas  without significant metabolic activity. No hypermetabolic abdominal pelvic adenopathy. Prostate gland is not significantly enlarged to 6.5 cm. Soft tissue within the right inguinal region without associated metabolic activity  SKELETON  No focal hypermetabolic activity to suggest skeletal metastasis.  IMPRESSION: 1. Hypermetabolic peribronchial thickening in the superior right hilum extending into the right upper lobe is consists with bronchogenic carcinoma versus postobstructive pneumonitis. Favor malignancy. 2. Hypermetabolic nodal at the right hilum versus primary lesion. 3. Moderately Hypermetabolic right lower paratracheal lymph node suspicious for ipsilateral mediastinal metastasis. 4. Hypermetabolic nodule within the superior segment of the right lower lobe is concerning for metastatic disease versus second primary lesion.  Electronically Signed   By: Suzy Bouchard M.D.   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 The PET scan and other such scans were explained and reviewed with Cina so that he could better understand his diagnosis. Treatment options we discussed include surgery, chemotherapy, and radiation treatments.   PLAN: Today, I talked to the patient about the findings and work-up thus far.  We discussed the natural history of lung cancer and general treatment, highlighting the role of radiotherapy in the management.  We discussed the available radiation techniques, and focused on the details of logistics and delivery.  We reviewed the anticipated acute and late sequelae associated with radiation in this setting.  The patient was encouraged to ask questions that I answered to the best of my ability. The patient would like to proceed with radiation and will be scheduled for CT simulation today.  I spent more than 50% of the visit in counseling and/or coordination of care.    This document serves  as a record of services personally performed by Tyler Pita, MD. It was created on his behalf by Lenn Cal, a trained medical scribe. The creation of this record is based on the scribe's personal observations and the provider's statements to them. This document has been checked and approved by the attending provider.  _______________________________________________________________________________________________________________________  Sheral Apley. Tammi Klippel, M.D.

## 2015-03-07 NOTE — Patient Instructions (Signed)
Smoking Cessation Quitting smoking is important to your health and has many advantages. However, it is not always easy to quit since nicotine is a very addictive drug. Oftentimes, people try 3 times or more before being able to quit. This document explains the best ways for you to prepare to quit smoking. Quitting takes hard work and a lot of effort, but you can do it. ADVANTAGES OF QUITTING SMOKING  You will live longer, feel better, and live better.  Your body will feel the impact of quitting smoking almost immediately.  Within 20 minutes, blood pressure decreases. Your pulse returns to its normal level.  After 8 hours, carbon monoxide levels in the blood return to normal. Your oxygen level increases.  After 24 hours, the chance of having a heart attack starts to decrease. Your breath, hair, and body stop smelling like smoke.  After 48 hours, damaged nerve endings begin to recover. Your sense of taste and smell improve.  After 72 hours, the body is virtually free of nicotine. Your bronchial tubes relax and breathing becomes easier.  After 2 to 12 weeks, lungs can hold more air. Exercise becomes easier and circulation improves.  The risk of having a heart attack, stroke, cancer, or lung disease is greatly reduced.  After 1 year, the risk of coronary heart disease is cut in half.  After 5 years, the risk of stroke falls to the same as a nonsmoker.  After 10 years, the risk of lung cancer is cut in half and the risk of other cancers decreases significantly.  After 15 years, the risk of coronary heart disease drops, usually to the level of a nonsmoker.  If you are pregnant, quitting smoking will improve your chances of having a healthy baby.  The people you live with, especially any children, will be healthier.  You will have extra money to spend on things other than cigarettes. QUESTIONS TO THINK ABOUT BEFORE ATTEMPTING TO QUIT You may want to talk about your answers with your  health care provider.  Why do you want to quit?  If you tried to quit in the past, what helped and what did not?  What will be the most difficult situations for you after you quit? How will you plan to handle them?  Who can help you through the tough times? Your family? Friends? A health care provider?  What pleasures do you get from smoking? What ways can you still get pleasure if you quit? Here are some questions to ask your health care provider:  How can you help me to be successful at quitting?  What medicine do you think would be best for me and how should I take it?  What should I do if I need more help?  What is smoking withdrawal like? How can I get information on withdrawal? GET READY  Set a quit date.  Change your environment by getting rid of all cigarettes, ashtrays, matches, and lighters in your home, car, or work. Do not let people smoke in your home.  Review your past attempts to quit. Think about what worked and what did not. GET SUPPORT AND ENCOURAGEMENT You have a better chance of being successful if you have help. You can get support in many ways.  Tell your family, friends, and coworkers that you are going to quit and need their support. Ask them not to smoke around you.  Get individual, group, or telephone counseling and support. Programs are available at local hospitals and health centers. Call   your local health department for information about programs in your area.  Spiritual beliefs and practices may help some smokers quit.  Download a "quit meter" on your computer to keep track of quit statistics, such as how long you have gone without smoking, cigarettes not smoked, and money saved.  Get a self-help book about quitting smoking and staying off tobacco. LEARN NEW SKILLS AND BEHAVIORS  Distract yourself from urges to smoke. Talk to someone, go for a walk, or occupy your time with a task.  Change your normal routine. Take a different route to work.  Drink tea instead of coffee. Eat breakfast in a different place.  Reduce your stress. Take a hot bath, exercise, or read a book.  Plan something enjoyable to do every day. Reward yourself for not smoking.  Explore interactive web-based programs that specialize in helping you quit. GET MEDICINE AND USE IT CORRECTLY Medicines can help you stop smoking and decrease the urge to smoke. Combining medicine with the above behavioral methods and support can greatly increase your chances of successfully quitting smoking.  Nicotine replacement therapy helps deliver nicotine to your body without the negative effects and risks of smoking. Nicotine replacement therapy includes nicotine gum, lozenges, inhalers, nasal sprays, and skin patches. Some may be available over-the-counter and others require a prescription.  Antidepressant medicine helps people abstain from smoking, but how this works is unknown. This medicine is available by prescription.  Nicotinic receptor partial agonist medicine simulates the effect of nicotine in your brain. This medicine is available by prescription. Ask your health care provider for advice about which medicines to use and how to use them based on your health history. Your health care provider will tell you what side effects to look out for if you choose to be on a medicine or therapy. Carefully read the information on the package. Do not use any other product containing nicotine while using a nicotine replacement product.  RELAPSE OR DIFFICULT SITUATIONS Most relapses occur within the first 3 months after quitting. Do not be discouraged if you start smoking again. Remember, most people try several times before finally quitting. You may have symptoms of withdrawal because your body is used to nicotine. You may crave cigarettes, be irritable, feel very hungry, cough often, get headaches, or have difficulty concentrating. The withdrawal symptoms are only temporary. They are strongest  when you first quit, but they will go away within 10-14 days. To reduce the chances of relapse, try to:  Avoid drinking alcohol. Drinking lowers your chances of successfully quitting.  Reduce the amount of caffeine you consume. Once you quit smoking, the amount of caffeine in your body increases and can give you symptoms, such as a rapid heartbeat, sweating, and anxiety.  Avoid smokers because they can make you want to smoke.  Do not let weight gain distract you. Many smokers will gain weight when they quit, usually less than 10 pounds. Eat a healthy diet and stay active. You can always lose the weight gained after you quit.  Find ways to improve your mood other than smoking. FOR MORE INFORMATION  www.smokefree.gov  Document Released: 08/18/2001 Document Revised: 01/08/2014 Document Reviewed: 12/03/2011 [employer] Patient Information 2015 [employer]. This information is not intended to replace advice given to you by your health care provider. Make sure you discuss any questions you have with your health care provider.  

## 2015-03-07 NOTE — Progress Notes (Signed)
[place] Telephone:([phone]) [phone]   [phone]  Multidisciplinary thoracic oncology clinic  CONSULT NOTE  REFERRING PHYSICIAN: Dr. Lanelle Bal  REASON FOR CONSULTATION:  69 years old African-American male recently diagnosed with lung cancer.  HPI Colton Nguyen is a 69 y.o. male with past medical history significant for hypertension, dyslipidemia, COPD, coronary artery disease, benign prostatic hypertrophy, anxiety as well as long history of smoking. The patient mentions that to 3 months ago he was treated for urinary tract infection and start also complaining of shortness of breath. He was seen by Dr. Humphrey Rolls at [facility] and has evaluation for coronary artery disease including cardiac CT scanning which showed a suspicious right upper lobe lesion in the lung. This was followed by CT scan of the chest on 05/20/2016and it showed 4.5 x 4.0 x 7.5 cm right upper lobe mass abutting the mediastinum. The patient was referred to Dr. Madolyn Frieze and on 02/20/2015 he had a PET scan performed and it showed ill-defined thickened tissue extending from the superior aspect of the right hilum along the right upper lobe bronchus to the medial aspect of the a right upper lobe. This thickening measures 32 by 27 mm in axial dimension with SUV max 7.3. There is a hypermetabolic focus at the right hilum which likely corresponds to soft tissue measuring 17 mm. This is more intense than the upper lobe peribronchial thickening with SUV max 11.9. Within the superior aspect of the right lower lobe 9 mm nodule is hypermetabolic with SUV max 4.2. There is moderate metabolic activity associated with right lower paratracheal lymph node which is enlarged to 15 mm with SUV max 3.8. MRI of the brain on 02/21/2015 showed unremarkable appearance of the brain for age with no evidence of intracranial metastasis. On 02/27/2015 the patient underwent bronchoscopy with biopsy and endoscopic ultrasound of the  bronchus with biopsy of 4R nodes under the care of Dr. Servando Snare. The final pathology all of the right upper lobe lung biopsy (Accession: [id number]) was consistent with squamous cell carcinoma.  Dr. Servando Snare kindly referred the patient to me today for further evaluation and recommendation regarding treatment of his condition. When seen today the patient is feeling fine except for sinus problem as well as shortness of breath with exertion and dry cough. He denied having any significant chest pain or hemoptysis. He has no significant weight loss but has occasional night sweats. The patient denied having any headache or visual changes. Family history significant for a mother with pulmonary hypertension, father had lung cancer at age 83. Brother had stroke. The patient is single and has one son. He is currently retired and used to work for the [employer]. He has a history of smoking 2 packs per day for around 47 years and unfortunately he continues to smoke but currently taking Chantix to try to quit smoking. He also drinks alcohol occasionally. No history of drug abuse.  HPI  Past Medical History  Diagnosis Date  . CHF (congestive heart failure)   . Hypertension   . Hyperlipidemia   . BPH (benign prostatic hyperplasia)   . CAD (coronary artery disease)   . Chronic cardiopulmonary disease   . Nicotine dependence   . Mass of lung   . COPD (chronic obstructive pulmonary disease)   . Shortness of breath dyspnea     with exertion  . Anxiety     situational  . Personal history of kidney stones   . Restless leg  Past Surgical History  Procedure Laterality Date  . Cad ccta 01/31/15    . Right inguinal hernia repair at age 55    . Colonoscopy    . Cervical fusion    . Back surgery    . Video bronchoscopy with endobronchial ultrasound N/A 02/27/2015    Procedure: VIDEO BRONCHOSCOPY WITH ENDOBRONCHIAL ULTRASOUND;  Surgeon: Grace Isaac, MD;  Location: [facility] OR;  Service: Thoracic;   Laterality: N/A;  . Lung biopsy N/A 02/27/2015    Procedure: LUNG BIOPSY;  Surgeon: Grace Isaac, MD;  Location: [facility] OR;  Service: Thoracic;  Laterality: N/A;    Family History  Problem Relation Age of Onset  . Cancer    . Heart disease Father   . Hypertension      Social History History  Substance Use Topics  . Smoking status: Current Every Day Smoker -- 2.00 packs/day for 47 years    Types: Cigarettes    Start date: 02/14/1967  . Smokeless tobacco: Never Used  . Alcohol Use: 0.0 oz/week    0 Standard drinks or equivalent per week     Comment: ocassional    No Known Allergies  Current Outpatient Prescriptions  Medication Sig Dispense Refill  . amLODipine (NORVASC) 10 MG tablet Take 10 mg by mouth daily.     Marland Kitchen aspirin 81 MG tablet Take 81 mg by mouth daily.    Marland Kitchen atorvastatin (LIPITOR) 40 MG tablet Take 40 mg by mouth daily.     Marland Kitchen BYSTOLIC 20 MG TABS Take 20 mg by mouth daily.     . CHANTIX STARTING MONTH PAK 0.5 MG X 11 & 1 MG X 42 tablet Take 0.5-1 mg by mouth as directed.     . Cholecalciferol (VITAMIN D3) 5000 UNITS TABS Take 5,000 Units by mouth daily.    . finasteride (PROSCAR) 5 MG tablet Take 5 mg by mouth daily.     Marland Kitchen losartan (COZAAR) 100 MG tablet Take 100 mg by mouth daily.     . tamsulosin (FLOMAX) 0.4 MG CAPS capsule Take 0.4 mg by mouth daily.     Marland Kitchen PROAIR HFA 108 (90 BASE) MCG/ACT inhaler Inhale 2 puffs into the lungs every 4 (four) hours as needed for wheezing or shortness of breath.     . prochlorperazine (COMPAZINE) 10 MG tablet Take 1 tablet (10 mg total) by mouth every 6 (six) hours as needed for nausea or vomiting. 30 tablet 0   No current facility-administered medications for this visit.    Review of Systems  Constitutional: negative Eyes: negative Ears, nose, mouth, throat, and face: negative Respiratory: positive for cough and dyspnea on exertion Cardiovascular: negative Gastrointestinal: negative Genitourinary:negative Integument/breast:  negative Hematologic/lymphatic: negative Musculoskeletal:negative Neurological: negative Behavioral/Psych: negative Endocrine: negative Allergic/Immunologic: negative  Physical Exam  NID:POEUM, healthy, no distress, well nourished and well developed SKIN: skin color, texture, turgor are normal, no rashes or significant lesions HEAD: Normocephalic, No masses, lesions, tenderness or abnormalities EYES: normal, PERRLA, Conjunctiva are pink and non-injected EARS: External ears normal, Canals clear OROPHARYNX:no exudate, no erythema and lips, buccal mucosa, and tongue normal  NECK: supple, no adenopathy, no JVD LYMPH:  no palpable lymphadenopathy, no hepatosplenomegaly LUNGS: clear to auscultation , and palpation HEART: regular rate & rhythm and no murmurs ABDOMEN:abdomen soft, non-tender, normal bowel sounds and no masses or organomegaly BACK: Back symmetric, no curvature., No CVA tenderness EXTREMITIES:no joint deformities, effusion, or inflammation, no edema, no skin discoloration  NEURO: alert & oriented x 3 with fluent  speech, no focal motor/sensory deficits  PERFORMANCE STATUS: ECOG 1  LABORATORY DATA: Lab Results  Component Value Date   WBC 7.2 02/27/2015   HGB 15.5 02/27/2015   HCT 45.2 02/27/2015   MCV 77.5* 02/27/2015   PLT 180 02/27/2015      Chemistry      Component Value Date/Time   NA 136 02/27/2015 0701   K 4.3 02/27/2015 0701   CL 102 02/27/2015 0701   CO2 22 02/27/2015 0701   BUN 11 02/27/2015 0701   CREATININE 1.07 02/27/2015 0701      Component Value Date/Time   CALCIUM 8.7* 02/27/2015 0701   ALKPHOS 103 02/27/2015 0701   AST 28 02/27/2015 0701   ALT 17 02/27/2015 0701   BILITOT 0.7 02/27/2015 0701       RADIOGRAPHIC STUDIES: Dg Chest 2 View  02/27/2015   CLINICAL DATA:  Initial encounter for preop respiratory evaluation right lung mass.  EXAM: CHEST  2 VIEW  COMPARISON:  PET-CT from 02/20/2015.  FINDINGS: Two-view exam of the chest shows  hyperexpansion. Right suprahilar opacity is compatible with patient's known lung lesion. 10 mm pulmonary nodule is evident in the right upper lobe. Is no pulmonary edema or focal airspace consolidation. No pleural effusion. The cardiopericardial silhouette is within normal limits for size. Imaged bony structures of the thorax are intact.  IMPRESSION: Right suprahilar mass with adjacent right upper lobe pulmonary nodule.  Hyperinflation.   Electronically Signed   By: Misty Stanley M.D.   On: 02/27/2015 07:01   Mr Jeri Cos JH Contrast  02/21/2015   CLINICAL DATA:  Possible right upper lobe lung cancer on recent chest CT and PET-CT. Headache. Evaluation for metastatic disease.  EXAM: MRI HEAD WITHOUT AND WITH CONTRAST  TECHNIQUE: Multiplanar, multiecho pulse sequences of the brain and surrounding structures were obtained without and with intravenous contrast.  CONTRAST:  67m MULTIHANCE GADOBENATE DIMEGLUMINE 529 MG/ML IV SOLN  COMPARISON:  None.  FINDINGS: There is no evidence of acute infarct, intracranial hemorrhage, mass, midline shift, or extra-axial fluid collection. Ventricles and sulci are within normal limits for age. No significant white matter disease is identified. No abnormal enhancement is identified.  Visualized upper cervical spine partially demonstrates artifact from prior anterior fusion as well as a broad-based posterior disc osteophyte complex at C3-4 resulting in at least a mild impression on the spinal cord. Moderate mucosal thickening and bubbly secretions are present in the left maxillary sinus. There is mild right frontal sinus mucosal thickening. Trace right mastoid fluid is noted. Major intracranial vascular flow voids are preserved.  IMPRESSION: 1. Unremarkable appearance of the brain for age. No evidence of intracranial metastases. 2. Left maxillary sinus mucosal thickening and fluid. Correlate clinically for acute sinusitis.   Electronically Signed   By: ALogan Bores  On: 02/21/2015  14:16   Nm Pet Image Initial (pi) Skull Base To Thigh  02/20/2015   CLINICAL DATA:  Initial treatment strategy for lung mass.  EXAM: NUCLEAR MEDICINE PET SKULL BASE TO THIGH  TECHNIQUE: 13.0 mCi F-18 FDG was injected intravenously. Full-ring PET imaging was performed from the skull base to thigh after the radiotracer. CT data was obtained and used for attenuation correction and anatomic localization.  FASTING BLOOD GLUCOSE:  Value: 100 mg/dl  COMPARISON:  Outside CT 02/04/2015  FINDINGS: NECK  No hypermetabolic lymph nodes in the neck.  CHEST  There is ill-defined thickened tissue extending from the superior aspect of the right hilum along the right upper lobe  bronchus to the medial aspect of the a right upper lobe. This thickening measures 32 by 27 mm in axial dimension on image 80 series 3 with SUV max 7.3.  There is a hypermetabolic focus at the right hilum which likely corresponds to soft tissue (image 95 measuring 17 mm. This is more intense than the upper lobe peribronchial thickening with SUV max 11.9  Within the superior aspect of the right lower lobe 9 mm nodule on image 91, series 3 is hypermetabolic with SUV max 4.2.  There is moderate metabolic activity associated with right lower paratracheal lymph node which is enlarged to 15 mm on image 93 series 3 with SUV max 3.8.  ABDOMEN/PELVIS  No abnormal metabolic activity the liver. 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 No hypermetabolic abdominal pelvic adenopathy. Prostate gland is not significantly enlarged to 6.5 cm. Soft tissue within the right inguinal region without associated metabolic activity  SKELETON  No focal hypermetabolic activity to suggest skeletal metastasis.  IMPRESSION: 1. Hypermetabolic peribronchial thickening in the superior right hilum extending into the right upper lobe is consists with bronchogenic carcinoma versus postobstructive pneumonitis. Favor malignancy. 2. Hypermetabolic nodal at the right  hilum versus primary lesion. 3. Moderately Hypermetabolic right lower paratracheal lymph node suspicious for ipsilateral mediastinal metastasis. 4. Hypermetabolic nodule within the superior segment of the right lower lobe is concerning for metastatic disease versus second primary lesion.   Electronically Signed   By: Suzy Bouchard M.D.   On: 02/20/2015 13:04    ASSESSMENT:this is a very pleasant 69 years old African-American male recently diagnosed with a stage IIIA (T3, N2, M0) non-small cell lung cancer, squamous cell carcinoma, presented with right upper lobe mass in addition to mediastinal lymphadenopathy and right lower lobe nodule, diagnosed in June 2016.   PLAN:I had a lengthy discussion with the patient today about his current disease stage, prognosis and treatment options. I recommended for the patient a course of concurrent chemoradiation with weekly carboplatin for AUC of 2 and paclitaxel 45 MG/M2. I discussed with the patient the adverse effect of the chemotherapy including but not limited to alopecia, myelosuppression, nausea and vomiting, peripheral neuropathy, liver or renal dysfunction. The patient will be seen later today by Dr. Tammi Klippel for evaluation and discussion of the radiotherapy option. He is expected to start the first cycle of chemoradiation on 03/18/2015. I will arrange for the patient to have a chemotherapy education class before starting the first dose of his treatment. I will call his pharmacy with prescription for Compazine 10 mg by mouth every 6 hours as needed for nausea. The patient was seen during the multidisciplinary thoracic oncology clinic today by medical oncology, radiation oncology, thoracic navigator, social worker and physical therapist. He would come back for follow-up visit in 3 weeks for reevaluation and management of any adverse effect of his treatment.  The patient voices understanding of current disease status and treatment options and is in  agreement with the current care plan.  All questions were answered. The patient knows to call the clinic with any problems, questions or concerns. We can certainly see the patient much sooner if necessary.  Thank you so much for allowing me to participate in the care of Colton Nguyen. I will continue to follow up the patient with you and assist in his care.  I spent 55 minutes counseling the patient face to face. The total time spent in the appointment was 80 minutes.  Disclaimer: This note was dictated with voice  recognition software. Similar sounding words can inadvertently be transcribed and may not be corrected upon review.   Colton Nguyen K. March 07, 2015, 2:49 PM

## 2015-03-07 NOTE — Progress Notes (Signed)
Oncology Nurse Navigator Documentation  Oncology Nurse Navigator Flowsheets 03/07/2015  Navigator Encounter Type Clinic/MDC  Patient Visit Type Initial  Treatment Phase Abnormal Scans  Barriers/Navigation Needs Education/spoke with patient today at [facility].  Educational materials given and explained along with resources at [employer] Newly Diagnosed Cancer Education  Time Spent with Patient 30

## 2015-03-07 NOTE — Telephone Encounter (Signed)
S/w pt confirming labs/ov/chemo edu class/nut per 06/30 POF, sent msg to add chemo NP, will mail out schedule once chemo is added but pt is aware of apts for 07/05.... KJ

## 2015-03-07 NOTE — Therapy (Signed)
[place], [place], [address] Phone: [phone]   Fax:  [phone]  Physical Therapy Evaluation  Patient Details  Name: Colton Nguyen MRN: [id number] Date of Birth: [birth year] Referring Provider:  Curt Bears, MD  Encounter Date: 03/07/2015      PT End of Session - 03/07/15 1524    Visit Number 1   Number of Visits 1   PT Start Time 1440   PT Stop Time 1500   PT Time Calculation (min) 20 min   Activity Tolerance Patient tolerated treatment well   Behavior During Therapy [facility] for tasks assessed/performed      Past Medical History  Diagnosis Date  . CHF (congestive heart failure)   . Hypertension   . Hyperlipidemia   . BPH (benign prostatic hyperplasia)   . CAD (coronary artery disease)   . Chronic cardiopulmonary disease   . Nicotine dependence   . Mass of lung   . COPD (chronic obstructive pulmonary disease)   . Shortness of breath dyspnea     with exertion  . Anxiety     situational  . Personal history of kidney stones   . Restless leg     Past Surgical History  Procedure Laterality Date  . Cad ccta 01/31/15    . Right inguinal hernia repair at age 69    . Colonoscopy    . Cervical fusion    . Back surgery    . Video bronchoscopy with endobronchial ultrasound N/A 02/27/2015    Procedure: VIDEO BRONCHOSCOPY WITH ENDOBRONCHIAL ULTRASOUND;  Surgeon: Grace Isaac, MD;  Location: [place];  Service: Thoracic;  Laterality: N/A;  . Lung biopsy N/A 02/27/2015    Procedure: LUNG BIOPSY;  Surgeon: Grace Isaac, MD;  Location: [place];  Service: Thoracic;  Laterality: N/A;    There were no vitals filed for this visit.  Visit Diagnosis:  Personal history of fall - Plan: PT plan of care cert/re-cert  Weakness of right lower extremity - Plan: PT plan of care cert/re-cert      Subjective Assessment - 03/07/15 1507    Subjective Patient presented with increasing shortness of  breath approx. 01/31/15.   Pertinent History Work-up for shortness of breath showed right upper lobe (apical) lung mass.  PET scan 6/15 showed SUV max of 7.3 for this mass; also with adenopathy of right hilar and mediastinal nodes.  Diagnosed with squamous cell carcinoma, stage IIIA and expected to have chemoradiation.  h/o COPD, HTN, CAD, CHF, SOB with exertion, anxiety; current smoker (94 pack-years).  h/o cervical fusion and back surgery (for stenosis?) 3-4 years ago.                                                                                 Currently in Pain? No/denies            [facility] PT Assessment - 03/07/15 0001    Assessment   Medical Diagnosis squamous cell CA of right upper lobe of lung, stage IIIA   Onset Date/Surgical Date 01/31/15   Precautions   Precautions Other (comment)  cancer precautions   Restrictions   Weight Bearing Restrictions No  Balance Screen   Has the patient fallen in the past 6 months Yes   How many times? once  has occasional foot drop from low back issues    Has the patient had a decrease in activity level because of a fear of falling?  No   Is the patient reluctant to leave their home because of a fear of falling?  No   Home Environment   Living Environment Private residence   Living Arrangements Alone   Type of [place] to enter   [place] One level   Prior Function   Level of Independence Independent   Leisure no regular exercise currently   Observation/Other Assessments   Observations older male in no acute distress   Functional Tests   Functional tests Sit to Stand   Sit to Stand   Comments 11 times in 30 seconds, slightly below average for his age   Posture/Postural Control   Posture/Postural Control Postural limitations   Postural Limitations --  elevated shoulders; otherwise good posture   ROM / Strength   AROM / PROM / Strength AROM;Strength   AROM   AROM Assessment Site Lumbar   Lumbar Flexion  reaches fingertips 10 inches to floor   Lumbar Extension 25% loss   Lumbar - Right Side Bend WFL   Lumbar - Left Side Bend 20% loss and painful in low back   Lumbar - Right Rotation 15% loss   Lumbar - Left Rotation 15% loss   Strength   Overall Strength Comments LEs grossly 5/5 throughout (tested in sitting) with the exception of right quads, which were 4+/5   Ambulation/Gait   Ambulation/Gait Yes   Ambulation/Gait Assistance 7: Independent  but reports intermittent right foot drop   Assistive device None   Balance   Balance Assessed Yes   Dynamic Standing Balance   Dynamic Standing - Comments reaches forward 11 inches in standing, below average for age, but perhaps limited by h/o back surgery                           PT Education - 03/07/15 1524    Education provided Yes   Education Details posture, breathing, walking, energy conservation, physical therapy info   Person(s) Educated Patient   Methods Explanation;Handout   Comprehension Verbalized understanding               Lung Clinic Goals - 03/07/15 1527    Patient will be able to verbalize understanding of the benefit of exercise to decrease fatigue.   Status Achieved   Patient will be able to verbalize the importance of posture.   Status Achieved   Patient will be able to demonstrate diaphragmatic breathing for improved lung function.   Status Achieved   Patient will be able to verbalize understanding of the role of physical therapy to prevent functional decline and who to contact if physical therapy is needed.   Status Achieved             Plan - 03/07/15 1524    Clinical Impression Statement Patient with h/o neck and back surgeries and personal h/o of a fall, just diagnosed with squamous cell lung cancer of right upper lobe, stage IIIA with lymphadenopthy.  Expected to have chemoradiation.   Pt will benefit from skilled therapeutic intervention in order to improve on the following  deficits Decreased mobility   Rehab Potential Excellent   PT Frequency One  time visit   PT Treatment/Interventions Patient/family education   PT Next Visit Plan None  at this time; patient may benefit from right LE strengthening.   PT Home Exercise Plan see education section   Consulted and Agree with Plan of Care Patient          G-Codes - Mar 15, 2015 1527    Functional Assessment Tool Used clinical judgement   Functional Limitation Mobility: Walking and moving around   Mobility: Walking and Moving Around Current Status [phone]) At least 1 percent but less than 20 percent impaired, limited or restricted   Mobility: Walking and Moving Around Goal Status [phone]) At least 1 percent but less than 20 percent impaired, limited or restricted   Mobility: Walking and Moving Around Discharge Status [phone]) At least 1 percent but less than 20 percent impaired, limited or restricted       Problem List Patient Active Problem List   Diagnosis Date Noted  . Squamous cell carcinoma of lung, stage III 03/15/15  . spiculated right upper lobe mass 02/14/2015    SALISBURY,DONNA 03/15/15, 3:29 PM  [place], [place], [address] Phone: [phone]   Fax:  [place], PT 15-Mar-2015 3:30 PM

## 2015-03-07 NOTE — Addendum Note (Signed)
Encounter addended by: Heywood Footman, RN on: 03/07/2015  5:00 PM<BR>     Documentation filed: Charges VN

## 2015-03-08 ENCOUNTER — Telehealth: Payer: Self-pay | Admitting: *Deleted

## 2015-03-08 NOTE — Telephone Encounter (Signed)
Per staff message and POF I have scheduled appts. Advised scheduler of appts. JMW  

## 2015-03-12 ENCOUNTER — Other Ambulatory Visit: Payer: Medicare Other

## 2015-03-12 ENCOUNTER — Ambulatory Visit: Payer: Medicare Other | Admitting: Nutrition

## 2015-03-12 ENCOUNTER — Encounter: Payer: Self-pay | Admitting: *Deleted

## 2015-03-12 NOTE — Progress Notes (Signed)
69 year old male diagnosed with lung cancer to receive concurrent chemoradiation therapy.  He is a patient of Dr. Earlie Server.  Past medical history includes CHF, hypertension, hyperlipidemia, CAD, COPD, and anxiety.  Medications include Lipitor, vitamin D3, and Compazine.  Labs include glucose 108 on June 22.  Height: 68 inches. Weight: 198 pounds. Usual body weight: 204 pounds June 9. BMI: 30.11.  Patient has not yet started his treatment but has just completed chemotherapy education class. Currently patient has no nutrition concerns. Denies current nutrition impact symptoms.  Nutrition diagnosis: Food and nutrition related knowledge deficit related to new diagnosis of lung cancer and associated treatments as evidenced by no prior need for nutrition related information.  Intervention: Patient was educated to consume small frequent meals and snacks with a source of protein food at every meal and snack. Provided specific examples of meals and snacks and provided increasing calories and protein fact sheet Provided some education on strategies for eating if patient develops difficulty swallowing.  Fact sheet was provided. Educated patient on the importance of weight maintenance. Questions were answered.  Teach back method used.  Contact information was given.  Monitoring, evaluation, goals: Patient will tolerate adequate calories and protein for minimal weight loss throughout treatment.  Next visit: Monday, July 25, during infusion.  **Disclaimer: This note was dictated with voice recognition software. Similar sounding words can inadvertently be transcribed and this note may contain transcription errors which may not have been corrected upon publication of note.**

## 2015-03-14 DIAGNOSIS — Z510 Encounter for antineoplastic radiation therapy: Secondary | ICD-10-CM | POA: Diagnosis not present

## 2015-03-18 ENCOUNTER — Encounter: Payer: Self-pay | Admitting: Oncology

## 2015-03-18 ENCOUNTER — Other Ambulatory Visit: Payer: Self-pay

## 2015-03-18 ENCOUNTER — Other Ambulatory Visit (HOSPITAL_BASED_OUTPATIENT_CLINIC_OR_DEPARTMENT_OTHER): Payer: Medicare Other

## 2015-03-18 ENCOUNTER — Ambulatory Visit (HOSPITAL_BASED_OUTPATIENT_CLINIC_OR_DEPARTMENT_OTHER): Payer: Medicare Other

## 2015-03-18 ENCOUNTER — Encounter: Payer: Self-pay | Admitting: Radiation Oncology

## 2015-03-18 ENCOUNTER — Ambulatory Visit
Admission: RE | Admit: 2015-03-18 | Discharge: 2015-03-18 | Disposition: A | Discharge status: Home / Self Care | Payer: Medicare Other | Source: Ambulatory Visit | Attending: Radiation Oncology | Admitting: Radiation Oncology

## 2015-03-18 ENCOUNTER — Ambulatory Visit (HOSPITAL_BASED_OUTPATIENT_CLINIC_OR_DEPARTMENT_OTHER): Payer: Medicare Other | Admitting: Oncology

## 2015-03-18 ENCOUNTER — Other Ambulatory Visit: Payer: Self-pay | Admitting: *Deleted

## 2015-03-18 VITALS — BP 126/64 | HR 69 | Temp 99.1°F | Resp 18 | Ht 68.0 in | Wt 199.2 lb

## 2015-03-18 VITALS — BP 122/69 | HR 64 | Temp 98.4°F | Resp 28

## 2015-03-18 DIAGNOSIS — Z5111 Encounter for antineoplastic chemotherapy: Secondary | ICD-10-CM | POA: Diagnosis present

## 2015-03-18 DIAGNOSIS — C3411 Malignant neoplasm of upper lobe, right bronchus or lung: Secondary | ICD-10-CM

## 2015-03-18 DIAGNOSIS — C3491 Malignant neoplasm of unspecified part of right bronchus or lung: Secondary | ICD-10-CM

## 2015-03-18 DIAGNOSIS — Z510 Encounter for antineoplastic radiation therapy: Secondary | ICD-10-CM | POA: Diagnosis not present

## 2015-03-18 LAB — CBC & DIFF AND RETIC
BASO%: 0.3 % (ref 0.0–2.0)
BASOS ABS: 0 10*3/uL (ref 0.0–0.1)
EOS ABS: 0.1 10*3/uL (ref 0.0–0.5)
EOS%: 1.4 % (ref 0.0–7.0)
HEMATOCRIT: 44.7 % (ref 38.4–49.9)
HGB: 15.1 g/dL (ref 13.0–17.1)
IMMATURE RETIC FRACT: 2.9 % — AB (ref 3.00–10.60)
LYMPH%: 21.4 % (ref 14.0–49.0)
MCH: 27 pg — ABNORMAL LOW (ref 27.2–33.4)
MCHC: 33.8 g/dL (ref 32.0–36.0)
MCV: 79.8 fL (ref 79.3–98.0)
MONO#: 0.4 10*3/uL (ref 0.1–0.9)
MONO%: 6.3 % (ref 0.0–14.0)
NEUT%: 70.6 % (ref 39.0–75.0)
NEUTROS ABS: 4.5 10*3/uL (ref 1.5–6.5)
Platelets: 226 10*3/uL (ref 140–400)
RBC: 5.6 10*6/uL (ref 4.20–5.82)
RDW: 16.1 % — ABNORMAL HIGH (ref 11.0–14.6)
Retic %: 0.8 % (ref 0.80–1.80)
Retic Ct Abs: 44.8 10*3/uL (ref 34.80–93.90)
WBC: 6.4 10*3/uL (ref 4.0–10.3)
lymph#: 1.4 10*3/uL (ref 0.9–3.3)

## 2015-03-18 LAB — COMPREHENSIVE METABOLIC PANEL (CC13)
ALBUMIN: 3.3 g/dL — AB (ref 3.5–5.0)
ALK PHOS: 112 U/L (ref 40–150)
ALT: 12 U/L (ref 0–55)
ANION GAP: 7 meq/L (ref 3–11)
AST: 17 U/L (ref 5–34)
BUN: 8.9 mg/dL (ref 7.0–26.0)
CALCIUM: 9.1 mg/dL (ref 8.4–10.4)
CHLORIDE: 107 meq/L (ref 98–109)
CO2: 24 mEq/L (ref 22–29)
Creatinine: 1 mg/dL (ref 0.7–1.3)
EGFR: >90 mL/min/{1.73_m2} (ref >90)
GLUCOSE: 104 mg/dL (ref 70–140)
POTASSIUM: 4.2 meq/L (ref 3.5–5.1)
Sodium: 137 mEq/L (ref 136–145)
Total Bilirubin: 0.43 mg/dL (ref 0.20–1.20)
Total Protein: 6.7 g/dL (ref 6.4–8.3)

## 2015-03-18 MED ORDER — SODIUM CHLORIDE 0.9 % IV SOLN
Freq: Once | INTRAVENOUS | Status: AC
  Administered 2015-03-18: 10:00:00 via INTRAVENOUS
  Filled 2015-03-18: qty 8

## 2015-03-18 MED ORDER — CARBOPLATIN CHEMO INJECTION 450 MG/45ML
218.0000 mg | Freq: Once | INTRAVENOUS | Status: AC
  Administered 2015-03-18: 220 mg via INTRAVENOUS
  Filled 2015-03-18: qty 22

## 2015-03-18 MED ORDER — DIPHENHYDRAMINE HCL 50 MG/ML IJ SOLN
50.0000 mg | Freq: Once | INTRAMUSCULAR | Status: AC
  Administered 2015-03-18: 50 mg via INTRAVENOUS

## 2015-03-18 MED ORDER — SODIUM CHLORIDE 0.9 % IV SOLN
Freq: Once | INTRAVENOUS | Status: AC
  Administered 2015-03-18: 10:00:00 via INTRAVENOUS

## 2015-03-18 MED ORDER — PACLITAXEL CHEMO INJECTION 300 MG/50ML
45.0000 mg/m2 | Freq: Once | INTRAVENOUS | Status: AC
  Administered 2015-03-18: 96 mg via INTRAVENOUS
  Filled 2015-03-18: qty 16

## 2015-03-18 MED ORDER — FAMOTIDINE IN NACL 20-0.9 MG/50ML-% IV SOLN
INTRAVENOUS | Status: AC
  Filled 2015-03-18: qty 50

## 2015-03-18 MED ORDER — FAMOTIDINE IN NACL 20-0.9 MG/50ML-% IV SOLN
20.0000 mg | Freq: Once | INTRAVENOUS | Status: AC
  Administered 2015-03-18: 20 mg via INTRAVENOUS

## 2015-03-18 MED ORDER — LORAZEPAM 2 MG/ML IJ SOLN
INTRAMUSCULAR | Status: AC
  Filled 2015-03-18: qty 1

## 2015-03-18 MED ORDER — LORAZEPAM 2 MG/ML IJ SOLN
0.5000 mg | Freq: Once | INTRAMUSCULAR | Status: AC
Start: 2015-03-18 — End: 2015-03-18
  Administered 2015-03-18: 0.5 mg via INTRAVENOUS

## 2015-03-18 MED ORDER — DIPHENHYDRAMINE HCL 50 MG/ML IJ SOLN
INTRAMUSCULAR | Status: AC
  Filled 2015-03-18: qty 1

## 2015-03-18 NOTE — Progress Notes (Signed)
[place] [phone]   [phone]) [phone]   OFFICE PROGRESS NOTE  REFERRING PHYSICIAN: Dr. Lanelle Bal  DIAGNOSIS: Stage IIIa (T3, N2, M0) non-small cell lung cancer, squamous cell carcinoma diagnosed in June 2016.  PRIOR THERAPY: None  CURRENT THERAPY: The patient is here to begin concurrent chemoradiation therapy beginning 03/18/2015. He will receive weekly carboplatin for an AUC of 2 and paclitaxel 45 mg meter squared.   HPI Kmari Halter Bourassa is a 69 y.o. male seen today with his wife prior to beginning his first cycle of chemotherapy. The patient feels fine today except for shortness of breath with exertion and dry cough. He denied having any significant chest pain or hemoptysis. He has no significant weight loss but has occasional night sweats. The patient denied having any headache or visual changes. His appetite is good and his weight is stable.  HPI  Past Medical History  Diagnosis Date  . CHF (congestive heart failure)   . Hypertension   . Hyperlipidemia   . BPH (benign prostatic hyperplasia)   . CAD (coronary artery disease)   . Chronic cardiopulmonary disease   . Nicotine dependence   . Mass of lung   . COPD (chronic obstructive pulmonary disease)   . Shortness of breath dyspnea     with exertion  . Anxiety     situational  . Personal history of kidney stones   . Restless leg     Past Surgical History  Procedure Laterality Date  . Cad ccta 01/31/15    . Right inguinal hernia repair at age 63    . Colonoscopy    . Cervical fusion    . Back surgery    . Video bronchoscopy with endobronchial ultrasound N/A 02/27/2015    Procedure: VIDEO BRONCHOSCOPY WITH ENDOBRONCHIAL ULTRASOUND;  Surgeon: Grace Isaac, MD;  Location: [facility] OR;  Service: Thoracic;  Laterality: N/A;  . Lung biopsy N/A 02/27/2015    Procedure: LUNG BIOPSY;  Surgeon: Grace Isaac, MD;  Location: [facility] OR;  Service: Thoracic;  Laterality: N/A;    Family  History  Problem Relation Age of Onset  . Cancer    . Heart disease Father   . Hypertension      Social History History  Substance Use Topics  . Smoking status: Current Every Day Smoker -- 2.00 packs/day for 47 years    Types: Cigarettes    Start date: 02/14/1967  . Smokeless tobacco: Never Used  . Alcohol Use: 0.0 oz/week    0 Standard drinks or equivalent per week     Comment: ocassional    No Known Allergies  Current Outpatient Prescriptions  Medication Sig Dispense Refill  . amLODipine (NORVASC) 10 MG tablet Take 10 mg by mouth daily.     Marland Kitchen aspirin 81 MG tablet Take 81 mg by mouth daily.    Marland Kitchen atorvastatin (LIPITOR) 40 MG tablet Take 40 mg by mouth daily.     Marland Kitchen BYSTOLIC 20 MG TABS Take 20 mg by mouth daily.     . CHANTIX STARTING MONTH PAK 0.5 MG X 11 & 1 MG X 42 tablet Take 0.5-1 mg by mouth as directed.     . Cholecalciferol (VITAMIN D3) 5000 UNITS TABS Take 5,000 Units by mouth daily.    . finasteride (PROSCAR) 5 MG tablet Take 5 mg by mouth daily.     Marland Kitchen losartan (COZAAR) 100 MG tablet Take 100 mg by mouth daily.     Marland Kitchen PROAIR HFA  108 (90 BASE) MCG/ACT inhaler Inhale 2 puffs into the lungs every 4 (four) hours as needed for wheezing or shortness of breath.     . prochlorperazine (COMPAZINE) 10 MG tablet Take 1 tablet (10 mg total) by mouth every 6 (six) hours as needed for nausea or vomiting. 30 tablet 0  . tamsulosin (FLOMAX) 0.4 MG CAPS capsule Take 0.4 mg by mouth daily.      No current facility-administered medications for this visit.    Review of Systems  Constitutional: negative Eyes: negative Ears, nose, mouth, throat, and face: negative Respiratory: positive for cough and dyspnea on exertion Cardiovascular: negative Gastrointestinal: negative Genitourinary:negative Integument/breast: negative Hematologic/lymphatic: negative Musculoskeletal:negative Neurological: negative Behavioral/Psych: negative Endocrine: negative Allergic/Immunologic:  negative  Physical Exam  UDJ:SHFWY, healthy, no distress, well nourished and well developed SKIN: skin color, texture, turgor are normal, no rashes or significant lesions HEAD: Normocephalic, No masses, lesions, tenderness or abnormalities EYES: normal, PERRLA, Conjunctiva are pink and non-injected EARS: External ears normal, Canals clear OROPHARYNX:no exudate, no erythema and lips, buccal mucosa, and tongue normal  NECK: supple, no adenopathy, no JVD LYMPH:  no palpable lymphadenopathy, no hepatosplenomegaly LUNGS: clear to auscultation , and palpation HEART: regular rate & rhythm and no murmurs ABDOMEN:abdomen soft, non-tender, normal bowel sounds and no masses or organomegaly BACK: Back symmetric, no curvature., No CVA tenderness EXTREMITIES:no joint deformities, effusion, or inflammation, no edema, no skin discoloration  NEURO: alert & oriented x 3 with fluent speech, no focal motor/sensory deficits  PERFORMANCE STATUS: ECOG 1  LABORATORY DATA: Lab Results  Component Value Date   WBC 6.4 03/18/2015   HGB 15.1 03/18/2015   HCT 44.7 03/18/2015   MCV 79.8 03/18/2015   PLT 226 03/18/2015      Chemistry      Component Value Date/Time   NA 137 03/18/2015 0817   NA 136 02/27/2015 0701   K 4.2 03/18/2015 0817   K 4.3 02/27/2015 0701   CL 102 02/27/2015 0701   CO2 24 03/18/2015 0817   CO2 22 02/27/2015 0701   BUN 8.9 03/18/2015 0817   BUN 11 02/27/2015 0701   CREATININE 1.0 03/18/2015 0817   CREATININE 1.07 02/27/2015 0701      Component Value Date/Time   CALCIUM 9.1 03/18/2015 0817   CALCIUM 8.7* 02/27/2015 0701   ALKPHOS 112 03/18/2015 0817   ALKPHOS 103 02/27/2015 0701   AST 17 03/18/2015 0817   AST 28 02/27/2015 0701   ALT 12 03/18/2015 0817   ALT 17 02/27/2015 0701   BILITOT 0.43 03/18/2015 0817   BILITOT 0.7 02/27/2015 0701       RADIOGRAPHIC STUDIES: Dg Chest 2 View  02/27/2015   CLINICAL DATA:  Initial encounter for preop respiratory evaluation right  lung mass.  EXAM: CHEST  2 VIEW  COMPARISON:  PET-CT from 02/20/2015.  FINDINGS: Two-view exam of the chest shows hyperexpansion. Right suprahilar opacity is compatible with patient's known lung lesion. 10 mm pulmonary nodule is evident in the right upper lobe. Is no pulmonary edema or focal airspace consolidation. No pleural effusion. The cardiopericardial silhouette is within normal limits for size. Imaged bony structures of the thorax are intact.  IMPRESSION: Right suprahilar mass with adjacent right upper lobe pulmonary nodule.  Hyperinflation.   Electronically Signed   By: Misty Stanley M.D.   On: 02/27/2015 07:01   Mr Jeri Cos OV Contrast  02/21/2015   CLINICAL DATA:  Possible right upper lobe lung cancer on recent chest CT and PET-CT. Headache. Evaluation  for metastatic disease.  EXAM: MRI HEAD WITHOUT AND WITH CONTRAST  TECHNIQUE: Multiplanar, multiecho pulse sequences of the brain and surrounding structures were obtained without and with intravenous contrast.  CONTRAST:  31m MULTIHANCE GADOBENATE DIMEGLUMINE 529 MG/ML IV SOLN  COMPARISON:  None.  FINDINGS: There is no evidence of acute infarct, intracranial hemorrhage, mass, midline shift, or extra-axial fluid collection. Ventricles and sulci are within normal limits for age. No significant white matter disease is identified. No abnormal enhancement is identified.  Visualized upper cervical spine partially demonstrates artifact from prior anterior fusion as well as a broad-based posterior disc osteophyte complex at C3-4 resulting in at least a mild impression on the spinal cord. Moderate mucosal thickening and bubbly secretions are present in the left maxillary sinus. There is mild right frontal sinus mucosal thickening. Trace right mastoid fluid is noted. Major intracranial vascular flow voids are preserved.  IMPRESSION: 1. Unremarkable appearance of the brain for age. No evidence of intracranial metastases. 2. Left maxillary sinus mucosal thickening  and fluid. Correlate clinically for acute sinusitis.   Electronically Signed   By: ALogan Bores  On: 02/21/2015 14:16   Nm Pet Image Initial (pi) Skull Base To Thigh  02/20/2015   CLINICAL DATA:  Initial treatment strategy for lung mass.  EXAM: NUCLEAR MEDICINE PET SKULL BASE TO THIGH  TECHNIQUE: 13.0 mCi F-18 FDG was injected intravenously. Full-ring PET imaging was performed from the skull base to thigh after the radiotracer. CT data was obtained and used for attenuation correction and anatomic localization.  FASTING BLOOD GLUCOSE:  Value: 100 mg/dl  COMPARISON:  Outside CT 02/04/2015  FINDINGS: NECK  No hypermetabolic lymph nodes in the neck.  CHEST  There is ill-defined thickened tissue extending from the superior aspect of the right hilum along the right upper lobe bronchus to the medial aspect of the a right upper lobe. This thickening measures 32 by 27 mm in axial dimension on image 80 series 3 with SUV max 7.3.  There is a hypermetabolic focus at the right hilum which likely corresponds to soft tissue (image 95 measuring 17 mm. This is more intense than the upper lobe peribronchial thickening with SUV max 11.9  Within the superior aspect of the right lower lobe 9 mm nodule on image 91, series 3 is hypermetabolic with SUV max 4.2.  There is moderate metabolic activity associated with right lower paratracheal lymph node which is enlarged to 15 mm on image 93 series 3 with SUV max 3.8.  ABDOMEN/PELVIS  No abnormal metabolic activity the liver. 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 No hypermetabolic abdominal pelvic adenopathy. Prostate gland is not significantly enlarged to 6.5 cm. Soft tissue within the right inguinal region without associated metabolic activity  SKELETON  No focal hypermetabolic activity to suggest skeletal metastasis.  IMPRESSION: 1. Hypermetabolic peribronchial thickening in the superior right hilum extending into the right upper lobe is consists with  bronchogenic carcinoma versus postobstructive pneumonitis. Favor malignancy. 2. Hypermetabolic nodal at the right hilum versus primary lesion. 3. Moderately Hypermetabolic right lower paratracheal lymph node suspicious for ipsilateral mediastinal metastasis. 4. Hypermetabolic nodule within the superior segment of the right lower lobe is concerning for metastatic disease versus second primary lesion.   Electronically Signed   By: SSuzy BouchardM.D.   On: 02/20/2015 13:04    ASSESSMENT AND PLAN: This is a very pleasant 69years old African-American male recently diagnosed with a stage IIIA (T3, N2, M0) non-small cell lung cancer, squamous cell  carcinoma, presented with right upper lobe mass in addition to mediastinal lymphadenopathy and right lower lobe nodule, diagnosed in June 2016. The patient is here to begin his first cycle of concurrent chemoradiation therapy with chemotherapy consisting of carboplatin for an AUC of 2 and paclitaxel 45 mg meter squared.  The patient was seen and discussed with Dr. Julien Nordmann. Adverse effects of the chemotherapy were discussed including but not limited to alopecia, myelosuppression, nausea and vomiting, peripheral neuropathy, liver or renal dysfunction. The patient is ready to begin his chemotherapy today.  The patient will have weekly lab and chemotherapy while receiving radiation. He will have a follow-up visit for symptom management in approximate 2 weeks.  The patient voices understanding of current disease status and treatment options and is in agreement with the current care plan.  All questions were answered. The patient knows to call the clinic with any problems, questions or concerns. We can certainly see the patient much sooner if necessary.   Mikey Bussing March 18, 2015, 3:32 PM  ADDENDUM: Hematology/Oncology Attending: I had a face to face encounter with the patient today. I recommended his care plan. This is a very pleasant 69 years old  African-American male recently diagnosed with a stage IIIA non-small cell lung cancer. The patient is here today to start the first cycle of concurrent chemoradiation with weekly carboplatin and paclitaxel. His recent MRI of the brain showed no evidence for metastatic disease to the brain. I recommended for the patient to proceed with the first cycle of his treatment as scheduled. He would come back for follow-up visit in 2 weeks for reevaluation and management of any adverse effect of his treatment. The patient was advised to call immediately if he has any concerning symptoms in the interval.  Disclaimer: This note was dictated with voice recognition software. Similar sounding words can inadvertently be transcribed and may be missed upon review. Eilleen Kempf., MD 03/18/2015

## 2015-03-18 NOTE — Patient Instructions (Signed)
[place] Discharge Instructions for Patients Receiving Chemotherapy  Today you received the following chemotherapy agents TAXOL, CARBOPLATIN  To help prevent nausea and vomiting after your treatment, we encourage you to take your nausea medication compazine 10 mg every 6 hours as needed. Start this evening.   If you develop nausea and vomiting that is not controlled by your nausea medication, call the clinic.   BELOW ARE SYMPTOMS THAT SHOULD BE REPORTED IMMEDIATELY:  *FEVER GREATER THAN 100.5 F  *CHILLS WITH OR WITHOUT FEVER  NAUSEA AND VOMITING THAT IS NOT CONTROLLED WITH YOUR NAUSEA MEDICATION  *UNUSUAL SHORTNESS OF BREATH  *UNUSUAL BRUISING OR BLEEDING  TENDERNESS IN MOUTH AND THROAT WITH OR WITHOUT PRESENCE OF ULCERS  *URINARY PROBLEMS  *BOWEL PROBLEMS  UNUSUAL RASH Items with * indicate a potential emergency and should be followed up as soon as possible.  Feel free to call the clinic you have any questions or concerns. The clinic phone number is [phone].  Please show the [facility] at check-in to the Emergency Department and triage nurse.

## 2015-03-19 ENCOUNTER — Ambulatory Visit
Admission: RE | Admit: 2015-03-19 | Discharge: 2015-03-19 | Disposition: A | Discharge status: Home / Self Care | Payer: Medicare Other | Source: Ambulatory Visit | Attending: Radiation Oncology | Admitting: Radiation Oncology

## 2015-03-19 ENCOUNTER — Encounter: Payer: Self-pay | Admitting: Radiation Oncology

## 2015-03-19 DIAGNOSIS — Z510 Encounter for antineoplastic radiation therapy: Secondary | ICD-10-CM | POA: Diagnosis not present

## 2015-03-20 ENCOUNTER — Telehealth: Payer: Self-pay | Admitting: Medical Oncology

## 2015-03-20 ENCOUNTER — Ambulatory Visit
Admission: RE | Admit: 2015-03-20 | Discharge: 2015-03-20 | Disposition: A | Discharge status: Home / Self Care | Payer: Medicare Other | Source: Ambulatory Visit | Attending: Radiation Oncology | Admitting: Radiation Oncology

## 2015-03-20 DIAGNOSIS — Z510 Encounter for antineoplastic radiation therapy: Secondary | ICD-10-CM | POA: Diagnosis not present

## 2015-03-20 NOTE — Telephone Encounter (Signed)
Pt doing well after chemo . He is eating and drinking fluids. Denies any problems.

## 2015-03-21 ENCOUNTER — Ambulatory Visit
Admission: RE | Admit: 2015-03-21 | Discharge: 2015-03-21 | Disposition: A | Discharge status: Home / Self Care | Payer: Medicare Other | Source: Ambulatory Visit | Attending: Radiation Oncology | Admitting: Radiation Oncology

## 2015-03-21 DIAGNOSIS — Z510 Encounter for antineoplastic radiation therapy: Secondary | ICD-10-CM | POA: Diagnosis not present

## 2015-03-22 ENCOUNTER — Encounter: Payer: Self-pay | Admitting: Radiation Oncology

## 2015-03-22 ENCOUNTER — Ambulatory Visit
Admission: RE | Admit: 2015-03-22 | Discharge: 2015-03-22 | Disposition: A | Discharge status: Home / Self Care | Payer: Medicare Other | Source: Ambulatory Visit | Attending: Radiation Oncology | Admitting: Radiation Oncology

## 2015-03-22 VITALS — BP 133/80 | HR 64 | Resp 16 | Wt 199.5 lb

## 2015-03-22 DIAGNOSIS — Z510 Encounter for antineoplastic radiation therapy: Secondary | ICD-10-CM | POA: Diagnosis not present

## 2015-03-22 DIAGNOSIS — C3411 Malignant neoplasm of upper lobe, right bronchus or lung: Secondary | ICD-10-CM

## 2015-03-22 DIAGNOSIS — Z720 Tobacco use: Secondary | ICD-10-CM | POA: Insufficient documentation

## 2015-03-22 DIAGNOSIS — C3491 Malignant neoplasm of unspecified part of right bronchus or lung: Secondary | ICD-10-CM | POA: Insufficient documentation

## 2015-03-22 NOTE — Progress Notes (Signed)
  Radiation Oncology         [id number]   Name: Colton Nguyen MRN: [id number]   Date: 03/22/2015  DOB: 11-27-1945   Weekly Radiation Therapy Management    ICD-9-CM ICD-10-CM   1. Squamous cell carcinoma of lung, stage III 162.3 C34.11     Current Dose: 10 Gy  Planned Dose:  66 Gy  Narrative The patient presents for routine under treatment assessment. Denies pain. Reports an occasional dry cough. Reports SOB with exertion. Denies difficult or painful swallowing. No skin changes noted in treatment field. Denies fatigue.  The patient is without complaint. Set-up films were reviewed. The chart was checked.  Physical Findings  weight is 199 lb 8 oz (90.493 kg). His blood pressure is 133/80 and his pulse is 64. His respiration is 16 and oxygen saturation is 90%. . Weight essentially stable.  No significant changes.  Impression The patient is tolerating radiation.  Plan Continue treatment as planned.         Sheral Apley Tammi Klippel, M.D.

## 2015-03-22 NOTE — Progress Notes (Addendum)
Weight and vitals stable. Denies pain. Reports an occasional dry cough. Reports SOB with exertion. Denies difficult or painful swallowing. No skin changes noted in treatment field. Denies fatigue.   BP 133/80 mmHg  Pulse 64  Resp 16  Wt 199 lb 8 oz (90.493 kg)  SpO2 90% Wt Readings from Last 3 Encounters:  03/22/15 199 lb 8 oz (90.493 kg)  03/18/15 199 lb 3.2 oz (90.357 kg)  03/07/15 198 lb (89.812 kg)   Oriented patient to staff and routine of the clinic. Provided patient with RADIATION THERAPY AND YOU handbook then, reviewed pertinent information. Educated patient reference potential side effects and management such as, fatigue, skin changes, and throat changes. Provided patient with radiaplex gel and directed upon use. Encouraged patient to contact this RN with future needs and provided a business card. Patient verbalized understanding of all reviewed.

## 2015-03-25 ENCOUNTER — Ambulatory Visit (HOSPITAL_BASED_OUTPATIENT_CLINIC_OR_DEPARTMENT_OTHER): Payer: Medicare Other

## 2015-03-25 ENCOUNTER — Other Ambulatory Visit: Payer: Self-pay

## 2015-03-25 ENCOUNTER — Other Ambulatory Visit: Payer: Self-pay | Admitting: Medical Oncology

## 2015-03-25 ENCOUNTER — Ambulatory Visit (HOSPITAL_BASED_OUTPATIENT_CLINIC_OR_DEPARTMENT_OTHER): Payer: Medicare Other | Admitting: Lab

## 2015-03-25 ENCOUNTER — Ambulatory Visit
Admission: RE | Admit: 2015-03-25 | Discharge: 2015-03-25 | Disposition: A | Discharge status: Home / Self Care | Payer: Medicare Other | Source: Ambulatory Visit | Attending: Radiation Oncology | Admitting: Radiation Oncology

## 2015-03-25 VITALS — BP 105/63 | HR 62 | Temp 98.5°F | Resp 18

## 2015-03-25 DIAGNOSIS — C3411 Malignant neoplasm of upper lobe, right bronchus or lung: Secondary | ICD-10-CM

## 2015-03-25 DIAGNOSIS — Z5111 Encounter for antineoplastic chemotherapy: Secondary | ICD-10-CM | POA: Diagnosis present

## 2015-03-25 DIAGNOSIS — Z510 Encounter for antineoplastic radiation therapy: Secondary | ICD-10-CM | POA: Diagnosis not present

## 2015-03-25 DIAGNOSIS — C3491 Malignant neoplasm of unspecified part of right bronchus or lung: Secondary | ICD-10-CM

## 2015-03-25 LAB — CBC WITH DIFFERENTIAL/PLATELET
BASO%: 0.5 % (ref 0.0–2.0)
Basophils Absolute: 0 10*3/uL (ref 0.0–0.1)
EOS ABS: 0.1 10*3/uL (ref 0.0–0.5)
EOS%: 2.8 % (ref 0.0–7.0)
HEMATOCRIT: 41.1 % (ref 38.4–49.9)
HGB: 13.7 g/dL (ref 13.0–17.1)
LYMPH#: 1.1 10*3/uL (ref 0.9–3.3)
LYMPH%: 24.5 % (ref 14.0–49.0)
MCH: 26.6 pg — ABNORMAL LOW (ref 27.2–33.4)
MCHC: 33.3 g/dL (ref 32.0–36.0)
MCV: 79.7 fL (ref 79.3–98.0)
MONO#: 0.2 10*3/uL (ref 0.1–0.9)
MONO%: 5.1 % (ref 0.0–14.0)
NEUT%: 67.1 % (ref 39.0–75.0)
NEUTROS ABS: 2.9 10*3/uL (ref 1.5–6.5)
Platelets: 226 10*3/uL (ref 140–400)
RBC: 5.16 10*6/uL (ref 4.20–5.82)
RDW: 15.6 % — AB (ref 11.0–14.6)
WBC: 4.3 10*3/uL (ref 4.0–10.3)

## 2015-03-25 MED ORDER — FAMOTIDINE IN NACL 20-0.9 MG/50ML-% IV SOLN
20.0000 mg | Freq: Once | INTRAVENOUS | Status: AC
  Administered 2015-03-25: 20 mg via INTRAVENOUS

## 2015-03-25 MED ORDER — FAMOTIDINE IN NACL 20-0.9 MG/50ML-% IV SOLN
INTRAVENOUS | Status: AC
  Filled 2015-03-25: qty 50

## 2015-03-25 MED ORDER — SODIUM CHLORIDE 0.9 % IV SOLN
218.0000 mg | Freq: Once | INTRAVENOUS | Status: AC
  Administered 2015-03-25: 220 mg via INTRAVENOUS
  Filled 2015-03-25: qty 22

## 2015-03-25 MED ORDER — DIPHENHYDRAMINE HCL 50 MG/ML IJ SOLN
INTRAMUSCULAR | Status: AC
  Filled 2015-03-25: qty 1

## 2015-03-25 MED ORDER — SODIUM CHLORIDE 0.9 % IV SOLN
Freq: Once | INTRAVENOUS | Status: AC
  Administered 2015-03-25: 10:00:00 via INTRAVENOUS

## 2015-03-25 MED ORDER — DIPHENHYDRAMINE HCL 50 MG/ML IJ SOLN
25.0000 mg | Freq: Once | INTRAMUSCULAR | Status: AC
  Administered 2015-03-25: 25 mg via INTRAVENOUS

## 2015-03-25 MED ORDER — DEXTROSE 5 % IV SOLN
45.0000 mg/m2 | Freq: Once | INTRAVENOUS | Status: AC
  Administered 2015-03-25: 96 mg via INTRAVENOUS
  Filled 2015-03-25: qty 16

## 2015-03-25 MED ORDER — SODIUM CHLORIDE 0.9 % IV SOLN
Freq: Once | INTRAVENOUS | Status: AC
  Administered 2015-03-25: 12:00:00 via INTRAVENOUS
  Filled 2015-03-25: qty 8

## 2015-03-25 NOTE — Patient Instructions (Signed)
[place] Discharge Instructions for Patients Receiving Chemotherapy  Today you received the following chemotherapy agents Taxol, Carboplatin.  To help prevent nausea and vomiting after your treatment, we encourage you to take your nausea medication.    If you develop nausea and vomiting that is not controlled by your nausea medication, call the clinic.   BELOW ARE SYMPTOMS THAT SHOULD BE REPORTED IMMEDIATELY:  *FEVER GREATER THAN 100.5 F  *CHILLS WITH OR WITHOUT FEVER  NAUSEA AND VOMITING THAT IS NOT CONTROLLED WITH YOUR NAUSEA MEDICATION  *UNUSUAL SHORTNESS OF BREATH  *UNUSUAL BRUISING OR BLEEDING  TENDERNESS IN MOUTH AND THROAT WITH OR WITHOUT PRESENCE OF ULCERS  *URINARY PROBLEMS  *BOWEL PROBLEMS  UNUSUAL RASH Items with * indicate a potential emergency and should be followed up as soon as possible.  Feel free to call the clinic you have any questions or concerns. The clinic phone number is [phone].  Please show the [place] at check-in to the Emergency Department and triage nurse.

## 2015-03-26 ENCOUNTER — Ambulatory Visit
Admission: RE | Admit: 2015-03-26 | Discharge: 2015-03-26 | Disposition: A | Discharge status: Home / Self Care | Payer: Medicare Other | Source: Ambulatory Visit | Attending: Radiation Oncology | Admitting: Radiation Oncology

## 2015-03-26 DIAGNOSIS — Z510 Encounter for antineoplastic radiation therapy: Secondary | ICD-10-CM | POA: Diagnosis not present

## 2015-03-26 MED ORDER — RADIAPLEXRX EX GEL
Freq: Once | CUTANEOUS | Status: AC
  Administered 2015-03-26: 10:00:00 via TOPICAL

## 2015-03-26 NOTE — Addendum Note (Signed)
Encounter addended by: Heywood Footman, RN on: 03/26/2015 10:03 AM<BR>     Documentation filed: Dx Association, Notes Section, Inpatient MAR, Orders

## 2015-03-27 ENCOUNTER — Ambulatory Visit
Admission: RE | Admit: 2015-03-27 | Discharge: 2015-03-27 | Disposition: A | Discharge status: Home / Self Care | Payer: Medicare Other | Source: Ambulatory Visit | Attending: Radiation Oncology | Admitting: Radiation Oncology

## 2015-03-27 DIAGNOSIS — Z510 Encounter for antineoplastic radiation therapy: Secondary | ICD-10-CM | POA: Diagnosis not present

## 2015-03-28 ENCOUNTER — Ambulatory Visit
Admission: RE | Admit: 2015-03-28 | Discharge: 2015-03-28 | Disposition: A | Discharge status: Home / Self Care | Payer: Medicare Other | Source: Ambulatory Visit | Attending: Radiation Oncology | Admitting: Radiation Oncology

## 2015-03-28 DIAGNOSIS — Z510 Encounter for antineoplastic radiation therapy: Secondary | ICD-10-CM | POA: Diagnosis not present

## 2015-03-29 ENCOUNTER — Ambulatory Visit
Admission: RE | Admit: 2015-03-29 | Discharge: 2015-03-29 | Disposition: A | Discharge status: Home / Self Care | Payer: Medicare Other | Source: Ambulatory Visit | Attending: Radiation Oncology | Admitting: Radiation Oncology

## 2015-03-29 ENCOUNTER — Encounter: Payer: Self-pay | Admitting: Radiation Oncology

## 2015-03-29 VITALS — BP 117/66 | HR 61 | Resp 16 | Wt 197.7 lb

## 2015-03-29 DIAGNOSIS — C3411 Malignant neoplasm of upper lobe, right bronchus or lung: Secondary | ICD-10-CM

## 2015-03-29 DIAGNOSIS — Z510 Encounter for antineoplastic radiation therapy: Secondary | ICD-10-CM | POA: Diagnosis not present

## 2015-03-29 NOTE — Progress Notes (Signed)
Weight and vitals stable. Reports dry cough continues. Denies pain. No skin changes noted within treatment field reports using radiaplex bid as directed. Reports SOB seems less. Reports mild fatigue. Denies difficulty swallowing or pain associated with swallowing.  BP 117/66 mmHg  Pulse 61  Resp 16  Wt 197 lb 11.2 oz (89.676 kg) Wt Readings from Last 3 Encounters:  03/29/15 197 lb 11.2 oz (89.676 kg)  03/22/15 199 lb 8 oz (90.493 kg)  03/18/15 199 lb 3.2 oz (90.357 kg)

## 2015-03-29 NOTE — Progress Notes (Signed)
  Radiation Oncology         [id number]   Name: Colton Nguyen MRN: [id number]   Date: 03/29/2015  DOB: Jun 16, 1946   Weekly Radiation Therapy Management      ICD-9-CM ICD-10-CM   1. Squamous cell carcinoma of lung, stage III 162.3 C34.11     Current Dose: 20 Gy  Planned Dose:  66 Gy  Narrative The patient presents for routine under treatment assessment. Weight and vitals stable. Reports dry cough continues. Denies pain. No skin changes noted within treatment field reports using radiaplex bid as directed. Reports SOB seems less. Reports mild fatigue. Denies difficulty swallowing or pain associated with swallowing. [name] is also receiving chemotherapy with medical oncology. The patient is without complaint. Set-up films were reviewed. The chart was checked.  Physical Findings  weight is 197 lb 11.2 oz (89.676 kg). His blood pressure is 117/66 and his pulse is 61. His respiration is 16. . Weight essentially stable.  No significant changes.  Impression The patient is tolerating radiation.  Plan Continue treatment as planned.   This document serves as a record of services personally performed by Tyler Pita, MD. It was created on his behalf by Arlyce Harman, a trained medical scribe. The creation of this record is based on the scribe's personal observations and the provider's statements to them. This document has been checked and approved by the attending provider.      Sheral Apley Tammi Klippel, M.D.

## 2015-04-01 ENCOUNTER — Ambulatory Visit
Admission: RE | Admit: 2015-04-01 | Discharge: 2015-04-01 | Disposition: A | Discharge status: Home / Self Care | Payer: Medicare Other | Source: Ambulatory Visit | Attending: Radiation Oncology | Admitting: Radiation Oncology

## 2015-04-01 ENCOUNTER — Encounter: Payer: Self-pay | Admitting: Internal Medicine

## 2015-04-01 ENCOUNTER — Encounter: Payer: Self-pay | Admitting: *Deleted

## 2015-04-01 ENCOUNTER — Other Ambulatory Visit: Payer: Self-pay | Admitting: *Deleted

## 2015-04-01 ENCOUNTER — Other Ambulatory Visit (HOSPITAL_BASED_OUTPATIENT_CLINIC_OR_DEPARTMENT_OTHER): Payer: Medicare Other

## 2015-04-01 ENCOUNTER — Ambulatory Visit: Payer: Medicare Other | Admitting: Nutrition

## 2015-04-01 ENCOUNTER — Ambulatory Visit (HOSPITAL_BASED_OUTPATIENT_CLINIC_OR_DEPARTMENT_OTHER): Payer: Medicare Other | Admitting: Internal Medicine

## 2015-04-01 ENCOUNTER — Ambulatory Visit (HOSPITAL_BASED_OUTPATIENT_CLINIC_OR_DEPARTMENT_OTHER): Payer: Medicare Other

## 2015-04-01 VITALS — BP 135/75 | HR 72 | Temp 98.3°F | Resp 18 | Ht 68.0 in | Wt 199.8 lb

## 2015-04-01 DIAGNOSIS — C3411 Malignant neoplasm of upper lobe, right bronchus or lung: Secondary | ICD-10-CM

## 2015-04-01 DIAGNOSIS — Z720 Tobacco use: Secondary | ICD-10-CM | POA: Diagnosis not present

## 2015-04-01 DIAGNOSIS — Z510 Encounter for antineoplastic radiation therapy: Secondary | ICD-10-CM | POA: Diagnosis not present

## 2015-04-01 DIAGNOSIS — Z5111 Encounter for antineoplastic chemotherapy: Secondary | ICD-10-CM | POA: Diagnosis present

## 2015-04-01 DIAGNOSIS — C3491 Malignant neoplasm of unspecified part of right bronchus or lung: Secondary | ICD-10-CM

## 2015-04-01 LAB — COMPREHENSIVE METABOLIC PANEL (CC13)
ALK PHOS: 89 U/L (ref 40–150)
ALT: 13 U/L (ref 0–55)
AST: 16 U/L (ref 5–34)
Albumin: 3.2 g/dL — ABNORMAL LOW (ref 3.5–5.0)
Anion Gap: 8 mEq/L (ref 3–11)
BUN: 10.7 mg/dL (ref 7.0–26.0)
CHLORIDE: 107 meq/L (ref 98–109)
CO2: 24 meq/L (ref 22–29)
Calcium: 8.9 mg/dL (ref 8.4–10.4)
Creatinine: 1 mg/dL (ref 0.7–1.3)
EGFR: >90 mL/min/{1.73_m2} (ref >90)
Glucose: 97 mg/dl (ref 70–140)
POTASSIUM: 4.3 meq/L (ref 3.5–5.1)
SODIUM: 139 meq/L (ref 136–145)
Total Bilirubin: 0.27 mg/dL (ref 0.20–1.20)
Total Protein: 6.4 g/dL (ref 6.4–8.3)

## 2015-04-01 LAB — CBC WITH DIFFERENTIAL/PLATELET
BASO%: 0.9 % (ref 0.0–2.0)
BASOS ABS: 0 10*3/uL (ref 0.0–0.1)
EOS%: 3.1 % (ref 0.0–7.0)
Eosinophils Absolute: 0.1 10*3/uL (ref 0.0–0.5)
HCT: 41.9 % (ref 38.4–49.9)
HEMOGLOBIN: 13.8 g/dL (ref 13.0–17.1)
LYMPH%: 21.9 % (ref 14.0–49.0)
MCH: 26.6 pg — ABNORMAL LOW (ref 27.2–33.4)
MCHC: 32.9 g/dL (ref 32.0–36.0)
MCV: 80.8 fL (ref 79.3–98.0)
MONO#: 0.2 10*3/uL (ref 0.1–0.9)
MONO%: 6.6 % (ref 0.0–14.0)
NEUT#: 2.2 10*3/uL (ref 1.5–6.5)
NEUT%: 67.5 % (ref 39.0–75.0)
PLATELETS: 213 10*3/uL (ref 140–400)
RBC: 5.18 10*6/uL (ref 4.20–5.82)
RDW: 16.4 % — ABNORMAL HIGH (ref 11.0–14.6)
WBC: 3.3 10*3/uL — ABNORMAL LOW (ref 4.0–10.3)
lymph#: 0.7 10*3/uL — ABNORMAL LOW (ref 0.9–3.3)

## 2015-04-01 MED ORDER — DIPHENHYDRAMINE HCL 50 MG/ML IJ SOLN
25.0000 mg | Freq: Once | INTRAMUSCULAR | Status: AC
  Administered 2015-04-01: 25 mg via INTRAVENOUS

## 2015-04-01 MED ORDER — CARBOPLATIN CHEMO INJECTION 450 MG/45ML
218.0000 mg | Freq: Once | INTRAVENOUS | Status: AC
  Administered 2015-04-01: 220 mg via INTRAVENOUS
  Filled 2015-04-01: qty 22

## 2015-04-01 MED ORDER — FAMOTIDINE IN NACL 20-0.9 MG/50ML-% IV SOLN
INTRAVENOUS | Status: AC
  Filled 2015-04-01: qty 50

## 2015-04-01 MED ORDER — SODIUM CHLORIDE 0.9 % IV SOLN
Freq: Once | INTRAVENOUS | Status: AC
  Administered 2015-04-01: 11:00:00 via INTRAVENOUS
  Filled 2015-04-01: qty 8

## 2015-04-01 MED ORDER — SODIUM CHLORIDE 0.9 % IV SOLN
Freq: Once | INTRAVENOUS | Status: AC
  Administered 2015-04-01: 10:00:00 via INTRAVENOUS

## 2015-04-01 MED ORDER — FAMOTIDINE IN NACL 20-0.9 MG/50ML-% IV SOLN
20.0000 mg | Freq: Once | INTRAVENOUS | Status: AC
  Administered 2015-04-01: 20 mg via INTRAVENOUS

## 2015-04-01 MED ORDER — DIPHENHYDRAMINE HCL 50 MG/ML IJ SOLN
INTRAMUSCULAR | Status: AC
  Filled 2015-04-01: qty 1

## 2015-04-01 MED ORDER — PACLITAXEL CHEMO INJECTION 300 MG/50ML
45.0000 mg/m2 | Freq: Once | INTRAVENOUS | Status: AC
  Administered 2015-04-01: 96 mg via INTRAVENOUS
  Filled 2015-04-01: qty 16

## 2015-04-01 NOTE — Progress Notes (Signed)
Oncology Nurse Navigator Documentation  Oncology Nurse Navigator Flowsheets 04/01/2015  Navigator Encounter Type Other  Patient Visit Type Follow-up/spoke with patient today. He is receiving cycle 3 today.  He does not have any complaints relating to treatment.   Patient is still smoking and spoke to him about quitting.  He is using Chantix and is trying.  I spoke to him about using visualization and explained.  He stated he would try.  I will follow up with him in chemo to give him written information on smoking cessation.    Treatment Phase Treatment  Barriers/Navigation Needs Education  Education Smoking cessation  Interventions Education Method  Education Method Verbal  Time Spent with Patient 15

## 2015-04-01 NOTE — Progress Notes (Signed)
Nutrition follow-up completed with patient receiving concurrent chemoradiation therapy for lung cancer. Weight was documented as 199.8 pounds July 25 increased slightly from 198 pounds. Noted albumin 3.2. Patient reports he has no nutrition concerns, and no nutrition impact symptoms. States he is eating well. Reports soft stools.  Nutrition diagnosis: Food and nutrition related knowledge deficit improved.  Intervention:  I enforced importance of small frequent meals and snacks for weight maintenance. Teach back method was used.  Monitoring, evaluation, goals: Patient has tolerated adequate calories and protein has had weight maintenance.  Next visit: Monday, August 8, during infusion.  **Disclaimer: This note was dictated with voice recognition software. Similar sounding words can inadvertently be transcribed and this note may contain transcription errors which may not have been corrected upon publication of note.**

## 2015-04-01 NOTE — Progress Notes (Signed)
Oncology Nurse Navigator Documentation  Oncology Nurse Navigator Flowsheets 04/01/2015  Navigator Encounter Type Other/I spoke with patient and wife during his chemo treatment.  I spoke to him about smoking cessation.  I gave her information from [place] and Smoking cessation card.  We reviewed tips to quit.    I also followed up with patient regarding PT.  Patient was seen at the [facility] by PT.  The evaluation noted patient was a candidate for PT.  I gave patient Out patient PT information and also notified PT team.    Patient Visit Type Follow-up  Treatment Phase Treatment  Barriers/Navigation Needs Education  Education Smoking cessation  Interventions Other  Education Method Written  Time Spent with Patient 30

## 2015-04-01 NOTE — Progress Notes (Signed)
[place] [phone]   [phone]  OFFICE PROGRESS NOTE  [name], [place] [address]  DIAGNOSIS: Stage IIIA (T3, N2, M0) non-small cell lung cancer, squamous cell carcinoma diagnosed in June 2016.  PRIOR THERAPY: None  CURRENT THERAPY: Concurrent chemoradiation with weekly carboplatin for an AUC of 2 and paclitaxel 45 mg/m2. Status post 2 cycles.  INTERVAL HISTORY: Colton Nguyen 69 y.o. male returns to the clinic today for follow-up visit accompanied by his wife. The patient is tolerating his current course of concurrent chemoradiation fairly well with no significant adverse effects. He denied having any significant chest pain, shortness of breath, cough or hemoptysis. He denied having any dysphagia or odynophagia. He has no nausea or vomiting. He has no fever or chills. No significant weight loss or night sweats. He is here today to start cycle #3 of his treatment. Unfortunately he continues to smoke but he is trying to quit and currently on Chantix.  MEDICAL HISTORY: Past Medical History  Diagnosis Date  . CHF (congestive heart failure)   . Hypertension   . Hyperlipidemia   . BPH (benign prostatic hyperplasia)   . CAD (coronary artery disease)   . Chronic cardiopulmonary disease   . Nicotine dependence   . Mass of lung   . COPD (chronic obstructive pulmonary disease)   . Shortness of breath dyspnea     with exertion  . Anxiety     situational  . Personal history of kidney stones   . Restless leg     ALLERGIES:  has No Known Allergies.  MEDICATIONS:  Current Outpatient Prescriptions  Medication Sig Dispense Refill  . amLODipine (NORVASC) 10 MG tablet Take 10 mg by mouth daily.     Marland Kitchen aspirin 81 MG tablet Take 81 mg by mouth daily.    Marland Kitchen atorvastatin (LIPITOR) 40 MG tablet Take 40 mg by mouth daily.     Marland Kitchen BYSTOLIC 20 MG TABS Take 20 mg by mouth daily.     . CHANTIX STARTING MONTH PAK 0.5 MG X  11 & 1 MG X 42 tablet Take 0.5-1 mg by mouth as directed.     . Cholecalciferol (VITAMIN D3) 5000 UNITS TABS Take 5,000 Units by mouth daily.    . finasteride (PROSCAR) 5 MG tablet Take 5 mg by mouth daily.     Marland Kitchen losartan (COZAAR) 100 MG tablet Take 100 mg by mouth daily.     . tamsulosin (FLOMAX) 0.4 MG CAPS capsule Take 0.4 mg by mouth daily.     . Wound Cleansers (RADIAPLEX EX) Apply topically.    Marland Kitchen PROAIR HFA 108 (90 BASE) MCG/ACT inhaler Inhale 2 puffs into the lungs every 4 (four) hours as needed for wheezing or shortness of breath.     . prochlorperazine (COMPAZINE) 10 MG tablet Take 1 tablet (10 mg total) by mouth every 6 (six) hours as needed for nausea or vomiting. (Patient not taking: Reported on 04/01/2015) 30 tablet 0   No current facility-administered medications for this visit.    SURGICAL HISTORY:  Past Surgical History  Procedure Laterality Date  . Cad ccta 01/31/15    . Right inguinal hernia repair at age 62    . Colonoscopy    . Cervical fusion    . Back surgery    . Video bronchoscopy with endobronchial ultrasound N/A 02/27/2015    Procedure: VIDEO BRONCHOSCOPY WITH ENDOBRONCHIAL ULTRASOUND;  Surgeon: Grace Isaac, MD;  Location:  [facility] OR;  Service: Thoracic;  Laterality: N/A;  . Lung biopsy N/A 02/27/2015    Procedure: LUNG BIOPSY;  Surgeon: Grace Isaac, MD;  Location: [facility] OR;  Service: Thoracic;  Laterality: N/A;    REVIEW OF SYSTEMS:  A comprehensive review of systems was negative.   PHYSICAL EXAMINATION: General appearance: alert, cooperative and no distress Head: Normocephalic, without obvious abnormality, atraumatic Neck: no adenopathy, no JVD, supple, symmetrical, trachea midline and thyroid not enlarged, symmetric, no tenderness/mass/nodules Lymph nodes: Cervical, supraclavicular, and axillary nodes normal. Resp: clear to auscultation bilaterally Back: symmetric, no curvature. ROM normal. No CVA tenderness. Cardio: regular rate and rhythm, S1, S2  normal, no murmur, click, rub or gallop GI: soft, non-tender; bowel sounds normal; no masses,  no organomegaly Extremities: extremities normal, atraumatic, no cyanosis or edema  ECOG PERFORMANCE STATUS: 0 - Asymptomatic  Blood pressure 135/75, pulse 72, temperature 98.3 F (36.8 C), temperature source Oral, resp. rate 18, height '5\' 8"'$  (1.727 m), weight 199 lb 12.8 oz (90.629 kg), SpO2 98 %.  LABORATORY DATA: Lab Results  Component Value Date   WBC 3.3* 04/01/2015   HGB 13.8 04/01/2015   HCT 41.9 04/01/2015   MCV 80.8 04/01/2015   PLT 213 04/01/2015      Chemistry      Component Value Date/Time   NA 137 03/18/2015 0817   NA 136 02/27/2015 0701   K 4.2 03/18/2015 0817   K 4.3 02/27/2015 0701   CL 102 02/27/2015 0701   CO2 24 03/18/2015 0817   CO2 22 02/27/2015 0701   BUN 8.9 03/18/2015 0817   BUN 11 02/27/2015 0701   CREATININE 1.0 03/18/2015 0817   CREATININE 1.07 02/27/2015 0701      Component Value Date/Time   CALCIUM 9.1 03/18/2015 0817   CALCIUM 8.7* 02/27/2015 0701   ALKPHOS 112 03/18/2015 0817   ALKPHOS 103 02/27/2015 0701   AST 17 03/18/2015 0817   AST 28 02/27/2015 0701   ALT 12 03/18/2015 0817   ALT 17 02/27/2015 0701   BILITOT 0.43 03/18/2015 0817   BILITOT 0.7 02/27/2015 0701       RADIOGRAPHIC STUDIES: No results found.  ASSESSMENT AND PLAN: This is a very pleasant 69 years old African-American male with a stage IIIa non-small cell lung cancer currently undergoing concurrent chemoradiation with weekly carboplatin and paclitaxel and tolerating his treatment fairly well. I recommended for the patient to proceed with cycle #3 today as scheduled. He'll come back for follow-up visit in 2 weeks for reevaluation and management of any adverse effect of his treatment. For smoke cessation I strongly encouraged the patient to quit smoking and he continues to try and currently on Chantix. The patient was also seen by thoracic navigator for smoke cessation  counseling. He was advised to call immediately if he has any concerning symptoms in the interval. The patient voices understanding of current disease status and treatment options and is in agreement with the current care plan.  All questions were answered. The patient knows to call the clinic with any problems, questions or concerns. We can certainly see the patient much sooner if necessary.  Disclaimer: This note was dictated with voice recognition software. Similar sounding words can inadvertently be transcribed and may not be corrected upon review.

## 2015-04-01 NOTE — Patient Instructions (Signed)
[facility] Discharge Instructions for Patients Receiving Chemotherapy  Today you received the following chemotherapy agents Taxol and Carboplatin.  To help prevent nausea and vomiting after your treatment, we encourage you to take your nausea medication as prescribed.   If you develop nausea and vomiting that is not controlled by your nausea medication, call the clinic.   BELOW ARE SYMPTOMS THAT SHOULD BE REPORTED IMMEDIATELY:  *FEVER GREATER THAN 100.5 F  *CHILLS WITH OR WITHOUT FEVER  NAUSEA AND VOMITING THAT IS NOT CONTROLLED WITH YOUR NAUSEA MEDICATION  *UNUSUAL SHORTNESS OF BREATH  *UNUSUAL BRUISING OR BLEEDING  TENDERNESS IN MOUTH AND THROAT WITH OR WITHOUT PRESENCE OF ULCERS  *URINARY PROBLEMS  *BOWEL PROBLEMS  UNUSUAL RASH Items with * indicate a potential emergency and should be followed up as soon as possible.  Feel free to call the clinic you have any questions or concerns. The clinic phone number is [phone].  Please show the CHEMO ALERT CARD at check-in to the Emergency Department and triage nurse.   

## 2015-04-02 ENCOUNTER — Ambulatory Visit
Admission: RE | Admit: 2015-04-02 | Discharge: 2015-04-02 | Disposition: A | Discharge status: Home / Self Care | Payer: Medicare Other | Source: Ambulatory Visit | Attending: Radiation Oncology | Admitting: Radiation Oncology

## 2015-04-02 DIAGNOSIS — Z510 Encounter for antineoplastic radiation therapy: Secondary | ICD-10-CM | POA: Diagnosis not present

## 2015-04-03 ENCOUNTER — Ambulatory Visit
Admission: RE | Admit: 2015-04-03 | Discharge: 2015-04-03 | Disposition: A | Discharge status: Home / Self Care | Payer: Medicare Other | Source: Ambulatory Visit | Attending: Radiation Oncology | Admitting: Radiation Oncology

## 2015-04-03 DIAGNOSIS — Z510 Encounter for antineoplastic radiation therapy: Secondary | ICD-10-CM | POA: Diagnosis not present

## 2015-04-04 ENCOUNTER — Encounter: Payer: Self-pay | Admitting: Physical Therapy

## 2015-04-04 ENCOUNTER — Ambulatory Visit
Admission: RE | Admit: 2015-04-04 | Discharge: 2015-04-04 | Disposition: A | Discharge status: Home / Self Care | Payer: Medicare Other | Source: Ambulatory Visit | Attending: Radiation Oncology | Admitting: Radiation Oncology

## 2015-04-04 ENCOUNTER — Encounter: Payer: Self-pay | Admitting: Radiation Oncology

## 2015-04-04 ENCOUNTER — Ambulatory Visit: Payer: Medicare Other | Attending: Internal Medicine | Admitting: Physical Therapy

## 2015-04-04 VITALS — BP 121/67 | HR 64 | Temp 98.9°F | Resp 16 | Ht 68.0 in | Wt 200.3 lb

## 2015-04-04 DIAGNOSIS — R531 Weakness: Secondary | ICD-10-CM | POA: Diagnosis not present

## 2015-04-04 DIAGNOSIS — R0600 Dyspnea, unspecified: Secondary | ICD-10-CM

## 2015-04-04 DIAGNOSIS — Z510 Encounter for antineoplastic radiation therapy: Secondary | ICD-10-CM | POA: Diagnosis not present

## 2015-04-04 DIAGNOSIS — R262 Difficulty in walking, not elsewhere classified: Secondary | ICD-10-CM

## 2015-04-04 DIAGNOSIS — C3411 Malignant neoplasm of upper lobe, right bronchus or lung: Secondary | ICD-10-CM

## 2015-04-04 NOTE — Therapy (Signed)
[place], [place], [address] Phone: [phone]   Fax:  [phone]  Physical Therapy Evaluation  Patient Details  Name: Colton Nguyen MRN: [id number] Date of Birth: [birth year] Referring Provider:  Curt Bears, MD  Encounter Date: 04/04/2015      PT End of Session - 04/04/15 1140    Visit Number 2   Number of Visits 18   Date for PT Re-Evaluation 05/30/15   PT Start Time 1100   PT Stop Time 1140   PT Time Calculation (min) 40 min   Activity Tolerance Patient tolerated treatment well   Behavior During Therapy [facility] for tasks assessed/performed      Past Medical History  Diagnosis Date  . CHF (congestive heart failure)   . Hypertension   . Hyperlipidemia   . BPH (benign prostatic hyperplasia)   . CAD (coronary artery disease)   . Chronic cardiopulmonary disease   . Nicotine dependence   . Mass of lung   . COPD (chronic obstructive pulmonary disease)   . Shortness of breath dyspnea     with exertion  . Anxiety     situational  . Personal history of kidney stones   . Restless leg     Past Surgical History  Procedure Laterality Date  . Cad ccta 01/31/15    . Right inguinal hernia repair at age 11    . Colonoscopy    . Cervical fusion    . Back surgery    . Video bronchoscopy with endobronchial ultrasound N/A 02/27/2015    Procedure: VIDEO BRONCHOSCOPY WITH ENDOBRONCHIAL ULTRASOUND;  Surgeon: Grace Isaac, MD;  Location: [place];  Service: Thoracic;  Laterality: N/A;  . Lung biopsy N/A 02/27/2015    Procedure: LUNG BIOPSY;  Surgeon: Grace Isaac, MD;  Location: [place];  Service: Thoracic;  Laterality: N/A;    There were no vitals filed for this visit.  Visit Diagnosis:  Generalized weakness - Plan: PT plan of care cert/re-cert  Dyspnea - Plan: PT plan of care cert/re-cert  Difficulty walking - Plan: PT plan of care cert/re-cert      Subjective Assessment - 04/04/15  1108    Subjective Patient was seen at the lung clinic for a baseline PT assessment on 03/07/15 and is here today to begin PT for strengthening.   Pertinent History Currently undergoing chemotherapy weekly (began 03/18/15) and daily radiation (began 03/18/15). He continues to smoke 1 pack per day which he reports is better than his previous 2 packs per day and he is trying to quit.   Patient Stated Goals Strengthening legs; improve breathing   Currently in Pain? No/denies            [facility] PT Assessment - 04/04/15 0001    Assessment   Medical Diagnosis squamous cell CA of right upper lobe of lung, stage IIIA   Onset Date/Surgical Date 01/31/15   Precautions   Precautions Other (comment)  Undergoing chemo/radiation for lung cancer   Restrictions   Weight Bearing Restrictions No   Balance Screen   Has the patient fallen in the past 6 months Yes   How many times? 1   Has the patient had a decrease in activity level because of a fear of falling?  No   Is the patient reluctant to leave their home because of a fear of falling?  No   Home Environment   Living Environment Private residence   Living Arrangements Alone   Type  of [place] to enter   [place] One level   Prior Function   Level of [place] Retired   [employer] Retired from [employer]   Leisure Walks 15 minutes 5 days per week   Cognition   Overall Cognitive Status Within Functional Limits for tasks assessed   Functional Tests   Functional tests Sit to Stand   Sit to Stand   Comments 10 times   Posture/Postural Control   Posture/Postural Control Postural limitations   Postural Limitations Rounded Shoulders;Forward head   Strength   Strength Assessment Site Hip;Knee   Right Hip Flexion 4/5   Right Hip External Rotation  --  4+/5   Right Hip Internal Rotation  --  4+/5   Right Hip ABduction 4/5   Right Hip ADduction 4/5   Left Hip Flexion 5/5   Left  Hip External Rotation  5   Left Hip Internal Rotation  5   Left Hip ABduction 5/5   Left Hip ADduction 4+/5   Right Knee Flexion 5/5   Right Knee Extension 5/5   Left Knee Flexion 5/5   Left Knee Extension 5/5   Ambulation/Gait   Assistive device None   6 Minute Walk- Baseline   6 Minute Walk- Baseline yes   Modified Borg Scale for Dyspnea 0- Nothing at all   Perceived Rate of Exertion (Borg) 6-   6 Minute walk- Post Test   6 Minute Walk Post Test yes   Modified Borg Scale for Dyspnea 3- Moderate shortness of breath or breathing difficulty   Perceived Rate of Exertion (Borg) 13- Somewhat hard   6 minute walk test results    Aerobic Endurance Distance Walked 970  steps            PT Education - 04/04/15 1209    Education provided Yes   Education Details Reviewed previously given information from [facility] - breathing, posture, energy conservation   Person(s) Educated Patient   Methods Explanation   Comprehension Verbalized understanding           Short Term Clinic Goals - 04/04/15 1214    CC Short Term Goal  #1   Title Patient will be able to tolerate his initial home exercise program to increase leg strength   Time 4   Period Weeks   Status New   CC Short Term Goal  #2   Title Patient will be able to improve his 6 minute walk test to report BORG Dyspnea score of </=2 (mild shortness of breath)   Time 4   Period Weeks   Status New   CC Short Term Goal  #3   Title Patient will be able to improve his 6 minute walk test to report BORG ratings of perceived exertion scale score of </=11 (fairly light)   Time 4   Period Weeks   Status New   CC Short Term Goal  #4   Title Patient will be able to report a >/= 25% improvement in his ability to tolerate household and daily tasks without feeling fatigued or needing to rest.   Time 4   Period Weeks   Status New          Long Term Clinic Goals - 04/04/15 1219    CC Long Term Goal  #1   Title Patient will be  able to perform his final home exercise program to increase leg strength and verbalize understanding of  safe self-progression   Time 8   Period Weeks   Status New   CC Long Term Goal  #2   Title Patient will be able to improve his 6 minute walk test to report BORG Dyspnea score of </=1 (very mild shortness of breath)   Time 8   Period Weeks   Status New   CC Long Term Goal  #3   Title Patient will be able to improve his 6 minute walk test to report BORG ratings of perceived exertion scale score of </=9 (veryy light)   Time 8   Period Weeks   Status New   CC Long Term Goal  #4   Title Patient will be able to report a >/= 50% improvement in his ability to tolerate household and daily tasks without feeling fatigued or needing to rest.   Time 8   Period Weeks   Status New   CC Long Term Goal  #5   Title Patient will be able to increase bilateral leg strength to >/= 4/5 for increased ease with ambulation and squatting   Time 8   Period Weeks   Status New            Plan - 04/11/2015 1210    Clinical Impression Statement Patient is a very pleasant 69 year old man undergoing chemoradiation for right lung cancer.  He is on day 14 of 33 radiation treatments and his 3rd week of weekly chemotherapy.  He appears to be doing quite well except for some shortness of breath and fatigue with activities.  He is open to beginning PT to try to improve his activity tolerance and reduce shortness of breath with activity to improve function.   Pt will benefit from skilled therapeutic intervention in order to improve on the following deficits Decreased strength;Difficulty walking;Decreased activity tolerance;Decreased endurance   Rehab Potential Excellent   Clinical Impairments Affecting Rehab Potential Current chemoradiation treatment   PT Frequency 2x / week   PT Duration 8 weeks   PT Treatment/Interventions Patient/family education;Energy conservation;Therapeutic exercise;Functional mobility  training;ADLs/Self Care Home Management   PT Next Visit Plan Begin overall strengthening: mini squats, heel raises, standing SLR, NuStep, recumbant bike, LE strength HEP   Consulted and Agree with Plan of Care Patient          G-Codes - 04-11-15 1222    Functional Assessment Tool Used clinical judgement   Functional Limitation Mobility: Walking and moving around   Mobility: Walking and Moving Around Current Status [id number]) At least 20 percent but less than 40 percent impaired, limited or restricted   Mobility: Walking and Moving Around Goal Status [phone]) 0 percent impaired, limited or restricted       Problem List Patient Active Problem List   Diagnosis Date Noted  . Smoking trying to quit 04/01/2015  . Squamous cell carcinoma of lung, stage III 03/07/2015  . spiculated right upper lobe mass 02/14/2015    Annia Friendly, PT 11-Apr-2015 12:24 PM  [place], [place], [address] Phone: [phone]   Fax:  [phone]

## 2015-04-04 NOTE — Progress Notes (Signed)
Colton Nguyen has completed 14 fractions to his right lung.  He denies pain.  He reports having acid reflux and more belching in the last few days.  He denies having a sore throat or trouble swallowing.  He does report his voice is hoarse.  He reports having a more frequent, dry cough.  He reports his shortness of breath is improving.  He has chemotherapy on Monday's and reports fatigue on these days.  The skin on his right chest and back is intact.  He is using radiaplex gel.  BP 121/67 mmHg  Pulse 64  Temp(Src) 98.9 F (37.2 C) (Oral)  Resp 16  Ht '5\' 8"'$  (1.727 m)  Wt 200 lb 4.8 oz (90.855 kg)  BMI 30.46 kg/m2  SpO2 100%

## 2015-04-04 NOTE — Progress Notes (Signed)
  Radiation Oncology         [phone]   Name: Colton Nguyen MRN: [id number]   Date: 04/04/2015  DOB: 27-Aug-1946   Weekly Radiation Therapy Management    Diagnosis: Colton Nguyen is a 69 year old gentleman presenting to clinic in regards to his Stage IIIA (T3, N2, M0) non-small cell lung cancer, squamous cell carcinoma.  Current Dose: 28 Gy  Planned Dose:  66 Gy  Narrative The patient presents for routine under treatment assessment and completed 14 fractions to his right lung.  He denies pain.  He reports having acid reflux and more belching in the last few days.  He denies having a sore throat or trouble swallowing.  He does report his voice is hoarse.  He reports having a more frequent, dry cough.  He reports his shortness of breath is improving.  He has chemotherapy on Monday's and reports fatigue on these days.  The skin on his right chest and back is intact.   Colton Nguyen is also receiving chemotherapy with medical oncology.Set-up films were reviewed and the chart was checked.  Physical Findings  height is '5\' 8"'$  (1.727 m) and weight is 200 lb 4.8 oz (90.855 kg). His oral temperature is 98.9 F (37.2 C). His blood pressure is 121/67 and his pulse is 64. His respiration is 16 and oxygen saturation is 100%. Marland Kitchen His weight is essentially stable with no significant changes to his overall state of health to note at this time.  Impression The patient is tolerating radiation.  Plan We reviewed common symptoms to expect at this point in the patient's recovery and reviewed healthy methods to manage these symptoms if they are to occur. All questions vocalized by the patient were fully addressed. Maintaining proper nutrition was emphasized. The patient was advised of their follow up appointment with radiation oncology to take place next week. The patient has been advised to continue treatment as planned.   This document serves as a record of services personally performed by Tyler Pita, MD. It  was created on his behalf by Lenn Cal, a trained medical scribe. The creation of this record is based on the scribe's personal observations and the provider's statements to them. This document has been checked and approved by the attending provider.  _________________________________________   Sheral Apley. Tammi Klippel, M.D.

## 2015-04-05 ENCOUNTER — Ambulatory Visit
Admission: RE | Admit: 2015-04-05 | Discharge: 2015-04-05 | Disposition: A | Discharge status: Home / Self Care | Payer: Medicare Other | Source: Ambulatory Visit | Attending: Radiation Oncology | Admitting: Radiation Oncology

## 2015-04-05 DIAGNOSIS — Z510 Encounter for antineoplastic radiation therapy: Secondary | ICD-10-CM | POA: Diagnosis not present

## 2015-04-08 ENCOUNTER — Ambulatory Visit (HOSPITAL_BASED_OUTPATIENT_CLINIC_OR_DEPARTMENT_OTHER): Payer: Medicare Other | Admitting: Physician Assistant

## 2015-04-08 ENCOUNTER — Telehealth: Payer: Self-pay | Admitting: Internal Medicine

## 2015-04-08 ENCOUNTER — Ambulatory Visit (HOSPITAL_BASED_OUTPATIENT_CLINIC_OR_DEPARTMENT_OTHER): Payer: Medicare Other

## 2015-04-08 ENCOUNTER — Encounter: Payer: Self-pay | Admitting: Physician Assistant

## 2015-04-08 ENCOUNTER — Ambulatory Visit
Admission: RE | Admit: 2015-04-08 | Discharge: 2015-04-08 | Disposition: A | Discharge status: Home / Self Care | Payer: Medicare Other | Source: Ambulatory Visit | Attending: Radiation Oncology | Admitting: Radiation Oncology

## 2015-04-08 ENCOUNTER — Other Ambulatory Visit (HOSPITAL_BASED_OUTPATIENT_CLINIC_OR_DEPARTMENT_OTHER): Payer: Medicare Other

## 2015-04-08 VITALS — BP 127/73 | HR 71 | Temp 97.7°F | Resp 18 | Ht 68.0 in | Wt 200.7 lb

## 2015-04-08 DIAGNOSIS — C3491 Malignant neoplasm of unspecified part of right bronchus or lung: Secondary | ICD-10-CM

## 2015-04-08 DIAGNOSIS — Z5111 Encounter for antineoplastic chemotherapy: Secondary | ICD-10-CM

## 2015-04-08 DIAGNOSIS — Z510 Encounter for antineoplastic radiation therapy: Secondary | ICD-10-CM | POA: Diagnosis not present

## 2015-04-08 DIAGNOSIS — C3411 Malignant neoplasm of upper lobe, right bronchus or lung: Secondary | ICD-10-CM

## 2015-04-08 LAB — COMPREHENSIVE METABOLIC PANEL (CC13)
ALBUMIN: 3.2 g/dL — AB (ref 3.5–5.0)
ALK PHOS: 79 U/L (ref 40–150)
ALT: 17 U/L (ref 0–55)
AST: 16 U/L (ref 5–34)
Anion Gap: 7 mEq/L (ref 3–11)
BUN: 14.3 mg/dL (ref 7.0–26.0)
CO2: 25 meq/L (ref 22–29)
CREATININE: 0.9 mg/dL (ref 0.7–1.3)
Calcium: 8.5 mg/dL (ref 8.4–10.4)
Chloride: 108 mEq/L (ref 98–109)
EGFR: >90 mL/min/{1.73_m2} (ref >90)
GLUCOSE: 93 mg/dL (ref 70–140)
Potassium: 4.3 mEq/L (ref 3.5–5.1)
Sodium: 139 mEq/L (ref 136–145)
Total Bilirubin: 0.21 mg/dL (ref 0.20–1.20)
Total Protein: 6.1 g/dL — ABNORMAL LOW (ref 6.4–8.3)

## 2015-04-08 LAB — CBC WITH DIFFERENTIAL/PLATELET
BASO%: 0.8 % (ref 0.0–2.0)
Basophils Absolute: 0 10*3/uL (ref 0.0–0.1)
EOS%: 1.7 % (ref 0.0–7.0)
Eosinophils Absolute: 0.1 10*3/uL (ref 0.0–0.5)
HCT: 41.4 % (ref 38.4–49.9)
HGB: 13.6 g/dL (ref 13.0–17.1)
LYMPH%: 17.6 % (ref 14.0–49.0)
MCH: 26.4 pg — AB (ref 27.2–33.4)
MCHC: 32.8 g/dL (ref 32.0–36.0)
MCV: 80.6 fL (ref 79.3–98.0)
MONO#: 0.3 10*3/uL (ref 0.1–0.9)
MONO%: 7.8 % (ref 0.0–14.0)
NEUT%: 72.1 % (ref 39.0–75.0)
NEUTROS ABS: 2.5 10*3/uL (ref 1.5–6.5)
Platelets: 209 10*3/uL (ref 140–400)
RBC: 5.14 10*6/uL (ref 4.20–5.82)
RDW: 16.2 % — ABNORMAL HIGH (ref 11.0–14.6)
WBC: 3.5 10*3/uL — ABNORMAL LOW (ref 4.0–10.3)
lymph#: 0.6 10*3/uL — ABNORMAL LOW (ref 0.9–3.3)

## 2015-04-08 MED ORDER — FAMOTIDINE IN NACL 20-0.9 MG/50ML-% IV SOLN
20.0000 mg | Freq: Once | INTRAVENOUS | Status: AC
  Administered 2015-04-08: 20 mg via INTRAVENOUS

## 2015-04-08 MED ORDER — SODIUM CHLORIDE 0.9 % IV SOLN
218.0000 mg | Freq: Once | INTRAVENOUS | Status: AC
  Administered 2015-04-08: 220 mg via INTRAVENOUS
  Filled 2015-04-08: qty 22

## 2015-04-08 MED ORDER — FAMOTIDINE IN NACL 20-0.9 MG/50ML-% IV SOLN
INTRAVENOUS | Status: AC
  Filled 2015-04-08: qty 50

## 2015-04-08 MED ORDER — SODIUM CHLORIDE 0.9 % IV SOLN
Freq: Once | INTRAVENOUS | Status: AC
  Administered 2015-04-08: 10:00:00 via INTRAVENOUS

## 2015-04-08 MED ORDER — DIPHENHYDRAMINE HCL 50 MG/ML IJ SOLN
INTRAMUSCULAR | Status: AC
  Filled 2015-04-08: qty 1

## 2015-04-08 MED ORDER — DIPHENHYDRAMINE HCL 50 MG/ML IJ SOLN
25.0000 mg | Freq: Once | INTRAMUSCULAR | Status: AC
  Administered 2015-04-08: 25 mg via INTRAVENOUS

## 2015-04-08 MED ORDER — SODIUM CHLORIDE 0.9 % IV SOLN
Freq: Once | INTRAVENOUS | Status: AC
  Administered 2015-04-08: 10:00:00 via INTRAVENOUS
  Filled 2015-04-08: qty 8

## 2015-04-08 MED ORDER — PACLITAXEL CHEMO INJECTION 300 MG/50ML
45.0000 mg/m2 | Freq: Once | INTRAVENOUS | Status: AC
  Administered 2015-04-08: 96 mg via INTRAVENOUS
  Filled 2015-04-08: qty 16

## 2015-04-08 NOTE — Patient Instructions (Signed)
Continue with your course of concurrent chemoradiation as scheduled Followup in 3 weeks 

## 2015-04-08 NOTE — Progress Notes (Addendum)
[place] [phone]   [phone]  OFFICE PROGRESS NOTE  [name], [place] [address]  DIAGNOSIS: Stage IIIA (T3, N2, M0) non-small cell lung cancer, squamous cell carcinoma diagnosed in June 2016.  PRIOR THERAPY: None  CURRENT THERAPY: Concurrent chemoradiation with weekly carboplatin for an AUC of 2 and paclitaxel 45 mg/m2.   INTERVAL HISTORY: Colton Nguyen 69 y.o. male returns to the clinic today for follow-up visit. The patient is tolerating his current course of concurrent chemoradiation fairly well with no significant adverse effects. He denied having any significant chest pain, shortness of breath, cough or hemoptysis. He denied having any dysphagia or odynophagia. He has no nausea or vomiting. He has no fever or chills. No significant weight loss or night sweats. He is here today to start cycle #4 of his treatment.  MEDICAL HISTORY: Past Medical History  Diagnosis Date  . CHF (congestive heart failure)   . Hypertension   . Hyperlipidemia   . BPH (benign prostatic hyperplasia)   . CAD (coronary artery disease)   . Chronic cardiopulmonary disease   . Nicotine dependence   . Mass of lung   . COPD (chronic obstructive pulmonary disease)   . Shortness of breath dyspnea     with exertion  . Anxiety     situational  . Personal history of kidney stones   . Restless leg     ALLERGIES:  has No Known Allergies.  MEDICATIONS:  Current Outpatient Prescriptions  Medication Sig Dispense Refill  . amLODipine (NORVASC) 10 MG tablet Take 10 mg by mouth daily.     Marland Kitchen aspirin 81 MG tablet Take 81 mg by mouth daily.    Marland Kitchen atorvastatin (LIPITOR) 40 MG tablet Take 40 mg by mouth daily.     Marland Kitchen BYSTOLIC 20 MG TABS Take 20 mg by mouth daily.     . cetirizine (ZYRTEC) 5 MG chewable tablet Chew 5 mg by mouth daily.    . CHANTIX STARTING MONTH PAK 0.5 MG X 11 & 1 MG X 42 tablet Take 0.5-1 mg by mouth as directed.       . Cholecalciferol (VITAMIN D3) 5000 UNITS TABS Take 5,000 Units by mouth daily.    . finasteride (PROSCAR) 5 MG tablet Take 5 mg by mouth daily.     . fluticasone (FLONASE) 50 MCG/ACT nasal spray     . losartan (COZAAR) 100 MG tablet Take 100 mg by mouth daily.     Marland Kitchen PROAIR HFA 108 (90 BASE) MCG/ACT inhaler Inhale 2 puffs into the lungs every 4 (four) hours as needed for wheezing or shortness of breath.     . prochlorperazine (COMPAZINE) 10 MG tablet Take 1 tablet (10 mg total) by mouth every 6 (six) hours as needed for nausea or vomiting. 30 tablet 0  . tamsulosin (FLOMAX) 0.4 MG CAPS capsule Take 0.4 mg by mouth daily.     . Wound Cleansers (RADIAPLEX EX) Apply topically.     No current facility-administered medications for this visit.    SURGICAL HISTORY:  Past Surgical History  Procedure Laterality Date  . Cad ccta 01/31/15    . Right inguinal hernia repair at age 77    . Colonoscopy    . Cervical fusion    . Back surgery    . Video bronchoscopy with endobronchial ultrasound N/A 02/27/2015    Procedure: VIDEO BRONCHOSCOPY WITH ENDOBRONCHIAL ULTRASOUND;  Surgeon: Grace Isaac, MD;  Location:  [facility] OR;  Service: Thoracic;  Laterality: N/A;  . Lung biopsy N/A 02/27/2015    Procedure: LUNG BIOPSY;  Surgeon: Grace Isaac, MD;  Location: [facility] OR;  Service: Thoracic;  Laterality: N/A;    REVIEW OF SYSTEMS:  A comprehensive review of systems was negative.   PHYSICAL EXAMINATION: General appearance: alert, cooperative and no distress Head: Normocephalic, without obvious abnormality, atraumatic Neck: no adenopathy, no JVD, supple, symmetrical, trachea midline and thyroid not enlarged, symmetric, no tenderness/mass/nodules Lymph nodes: Cervical, supraclavicular, and axillary nodes normal. Resp: clear to auscultation bilaterally Back: symmetric, no curvature. ROM normal. No CVA tenderness. Cardio: regular rate and rhythm, S1, S2 normal, no murmur, click, rub or gallop GI: soft,  non-tender; bowel sounds normal; no masses,  no organomegaly Extremities: extremities normal, atraumatic, no cyanosis or edema  ECOG PERFORMANCE STATUS: 0 - Asymptomatic  Blood pressure 127/73, pulse 71, temperature 97.7 F (36.5 C), temperature source Oral, resp. rate 18, height '5\' 8"'$  (1.727 m), weight 200 lb 11.2 oz (91.037 kg), SpO2 96 %.  LABORATORY DATA: Lab Results  Component Value Date   WBC 3.5* 04/08/2015   HGB 13.6 04/08/2015   HCT 41.4 04/08/2015   MCV 80.6 04/08/2015   PLT 209 04/08/2015      Chemistry      Component Value Date/Time   NA 139 04/08/2015 0847   NA 136 02/27/2015 0701   K 4.3 04/08/2015 0847   K 4.3 02/27/2015 0701   CL 102 02/27/2015 0701   CO2 25 04/08/2015 0847   CO2 22 02/27/2015 0701   BUN 14.3 04/08/2015 0847   BUN 11 02/27/2015 0701   CREATININE 0.9 04/08/2015 0847   CREATININE 1.07 02/27/2015 0701      Component Value Date/Time   CALCIUM 8.5 04/08/2015 0847   CALCIUM 8.7* 02/27/2015 0701   ALKPHOS 79 04/08/2015 0847   ALKPHOS 103 02/27/2015 0701   AST 16 04/08/2015 0847   AST 28 02/27/2015 0701   ALT 17 04/08/2015 0847   ALT 17 02/27/2015 0701   BILITOT 0.21 04/08/2015 0847   BILITOT 0.7 02/27/2015 0701       RADIOGRAPHIC STUDIES: No results found.  ASSESSMENT AND PLAN: This is a very pleasant 69 years old African-American male with a stage IIIa non-small cell lung cancer currently undergoing concurrent chemoradiation with weekly carboplatin and paclitaxel and tolerating his treatment fairly well. Patient was discussed with and also seen by Dr. Julien Nordmann. He will proceed with week 4 of his treatment with concurrent chemoradiation. He will continue his course of concurrent chemoradiation as scheduled and follow-up in 3 weeks for another symptom management visit.  Patient to continue on Chantix and his effort to quit smoking.  He was advised to call immediately if he has any concerning symptoms in the interval. The patient voices  understanding of current disease status and treatment options and is in agreement with the current care plan.  All questions were answered. The patient knows to call the clinic with any problems, questions or concerns. We can certainly see the patient much sooner if necessary.  Carlton Adam, PA-C 04/08/2015   ADDENDUM: Hematology/Oncology Attending: I had a face to face encounter with the patient. I recommended his care plan. This is a very pleasant 69 years old African-American male with stage IIIa non-small cell lung cancer. He is currently undergoing a course of concurrent chemoradiation with weekly carboplatin and paclitaxel status post 3 cycles. The patient history rating his treatment fairly well with no significant adverse  effects except for mild sore throat. He denied having any significant chest pain, shortness breath, cough or hemoptysis. The patient denied having any significant weight loss or night sweats. I recommended for him to proceed with cycle #4 today as scheduled. He would come back for follow-up visit in 3 weeks for reevaluation close to the end of his treatment. For smoke cessation the patient will continue on Chantix for now. He was advised to call immediately if he has any concerning symptoms in the interval.  Disclaimer: This note was dictated with voice recognition software. Similar sounding words can inadvertently be transcribed and may be missed upon review. Eilleen Kempf., MD 04/08/2015

## 2015-04-08 NOTE — Patient Instructions (Signed)
[facility] Discharge Instructions for Patients Receiving Chemotherapy  Today you received the following chemotherapy agents Taxol/Carboplatin.  To help prevent nausea and vomiting after your treatment, we encourage you to take your nausea medication as directed.   If you develop nausea and vomiting that is not controlled by your nausea medication, call the clinic.   BELOW ARE SYMPTOMS THAT SHOULD BE REPORTED IMMEDIATELY:  *FEVER GREATER THAN 100.5 F  *CHILLS WITH OR WITHOUT FEVER  NAUSEA AND VOMITING THAT IS NOT CONTROLLED WITH YOUR NAUSEA MEDICATION  *UNUSUAL SHORTNESS OF BREATH  *UNUSUAL BRUISING OR BLEEDING  TENDERNESS IN MOUTH AND THROAT WITH OR WITHOUT PRESENCE OF ULCERS  *URINARY PROBLEMS  *BOWEL PROBLEMS  UNUSUAL RASH Items with * indicate a potential emergency and should be followed up as soon as possible.  Feel free to call the clinic you have any questions or concerns. The clinic phone number is [phone].  Please show the [place] at check-in to the Emergency Department and triage nurse.

## 2015-04-08 NOTE — Telephone Encounter (Signed)
Added appt per pof....the patient will get new sched in chemo

## 2015-04-09 ENCOUNTER — Ambulatory Visit
Admission: RE | Admit: 2015-04-09 | Discharge: 2015-04-09 | Disposition: A | Discharge status: Home / Self Care | Payer: Medicare Other | Source: Ambulatory Visit | Attending: Radiation Oncology | Admitting: Radiation Oncology

## 2015-04-09 DIAGNOSIS — Z510 Encounter for antineoplastic radiation therapy: Secondary | ICD-10-CM | POA: Diagnosis not present

## 2015-04-10 ENCOUNTER — Ambulatory Visit
Admission: RE | Admit: 2015-04-10 | Discharge: 2015-04-10 | Disposition: A | Discharge status: Home / Self Care | Payer: Medicare Other | Source: Ambulatory Visit | Attending: Radiation Oncology | Admitting: Radiation Oncology

## 2015-04-10 DIAGNOSIS — Z510 Encounter for antineoplastic radiation therapy: Secondary | ICD-10-CM | POA: Diagnosis not present

## 2015-04-11 ENCOUNTER — Ambulatory Visit
Admission: RE | Admit: 2015-04-11 | Discharge: 2015-04-11 | Disposition: A | Discharge status: Home / Self Care | Payer: Medicare Other | Source: Ambulatory Visit | Attending: Radiation Oncology | Admitting: Radiation Oncology

## 2015-04-11 DIAGNOSIS — Z510 Encounter for antineoplastic radiation therapy: Secondary | ICD-10-CM | POA: Diagnosis not present

## 2015-04-12 ENCOUNTER — Ambulatory Visit
Admission: RE | Admit: 2015-04-12 | Discharge: 2015-04-12 | Disposition: A | Discharge status: Home / Self Care | Payer: Medicare Other | Source: Ambulatory Visit | Attending: Radiation Oncology | Admitting: Radiation Oncology

## 2015-04-12 ENCOUNTER — Encounter: Payer: Self-pay | Admitting: Radiation Oncology

## 2015-04-12 VITALS — BP 123/70 | HR 64 | Resp 16 | Wt 204.5 lb

## 2015-04-12 DIAGNOSIS — Z510 Encounter for antineoplastic radiation therapy: Secondary | ICD-10-CM | POA: Diagnosis not present

## 2015-04-12 DIAGNOSIS — C3411 Malignant neoplasm of upper lobe, right bronchus or lung: Secondary | ICD-10-CM

## 2015-04-12 NOTE — Progress Notes (Signed)
Weight and vitals stable. Denies pain. Reports frequency of dry cough is less. Reports SOB is less. Reports he is breathing better than he was when he initial started radiation treatment. Denies painful or difficult swallowing. Reports faint hyperpigmentation with desquamation within treatment field. Reports using radiaplex as directed.  BP 123/70 mmHg  Pulse 64  Resp 16  Wt 204 lb 8 oz (92.761 kg) Wt Readings from Last 3 Encounters:  04/12/15 204 lb 8 oz (92.761 kg)  04/08/15 200 lb 11.2 oz (91.037 kg)  04/04/15 200 lb 4.8 oz (90.855 kg)

## 2015-04-12 NOTE — Progress Notes (Signed)
Department of Radiation Oncology  Phone:  [phone] Fax:        [phone]  Weekly Treatment Note    Name: Colton Nguyen [facility] Date: 04/12/2015 MRN: [id number] DOB: 12-27-1945   Current dose: 40 Gy  Current fraction: 20   MEDICATIONS: Current Outpatient Prescriptions  Medication Sig Dispense Refill  . amLODipine (NORVASC) 10 MG tablet Take 10 mg by mouth daily.     Marland Kitchen aspirin 81 MG tablet Take 81 mg by mouth daily.    Marland Kitchen atorvastatin (LIPITOR) 40 MG tablet Take 40 mg by mouth daily.     Marland Kitchen BYSTOLIC 20 MG TABS Take 20 mg by mouth daily.     . cetirizine (ZYRTEC) 5 MG chewable tablet Chew 5 mg by mouth daily.    . CHANTIX STARTING MONTH PAK 0.5 MG X 11 & 1 MG X 42 tablet Take 0.5-1 mg by mouth as directed.     . Cholecalciferol (VITAMIN D3) 5000 UNITS TABS Take 5,000 Units by mouth daily.    . finasteride (PROSCAR) 5 MG tablet Take 5 mg by mouth daily.     . fluticasone (FLONASE) 50 MCG/ACT nasal spray     . losartan (COZAAR) 100 MG tablet Take 100 mg by mouth daily.     Marland Kitchen PROAIR HFA 108 (90 BASE) MCG/ACT inhaler Inhale 2 puffs into the lungs every 4 (four) hours as needed for wheezing or shortness of breath.     . prochlorperazine (COMPAZINE) 10 MG tablet Take 1 tablet (10 mg total) by mouth every 6 (six) hours as needed for nausea or vomiting. 30 tablet 0  . tamsulosin (FLOMAX) 0.4 MG CAPS capsule Take 0.4 mg by mouth daily.     . Wound Cleansers (RADIAPLEX EX) Apply topically.     No current facility-administered medications for this encounter.     ALLERGIES: Review of patient's allergies indicates no known allergies.   LABORATORY DATA:  Lab Results  Component Value Date   WBC 3.5* 04/08/2015   HGB 13.6 04/08/2015   HCT 41.4 04/08/2015   MCV 80.6 04/08/2015   PLT 209 04/08/2015   Lab Results  Component Value Date   NA 139 04/08/2015   K 4.3 04/08/2015   CL 102 02/27/2015   CO2 25 04/08/2015   Lab Results  Component Value Date   ALT 17 04/08/2015   AST  16 04/08/2015   ALKPHOS 79 04/08/2015   BILITOT 0.21 04/08/2015     NARRATIVE: Santo Zahradnik Andres was seen today for weekly treatment management. The chart was checked and the patient's films were reviewed.  Weight and vitals stable. Denies pain. Reports frequency of dry cough is less. Reports SOB is less. Reports he is breathing better than he was when he initial started radiation treatment. Denies painful or difficult swallowing. Reports faint hyperpigmentation with desquamation within treatment field. Reports using radiaplex as directed.  Notes mild nausea.  PHYSICAL EXAMINATION: weight is 204 lb 8 oz (92.761 kg). His blood pressure is 123/70 and his pulse is 64. His respiration is 16.        ASSESSMENT: The patient is doing satisfactorily with treatment.  PLAN: We will continue with the patient's radiation treatment as planned.    ------------------------------------------------  Jodelle Gross, MD, PhD  This document serves as a record of services personally performed by Kyung Rudd, MD. It was created on his behalf by Derek Mound, a trained medical scribe. The creation of this record is based on the scribe's personal observations and  the provider's statements to them. This document has been checked and approved by the attending provider.

## 2015-04-15 ENCOUNTER — Ambulatory Visit
Admission: RE | Admit: 2015-04-15 | Discharge: 2015-04-15 | Disposition: A | Discharge status: Home / Self Care | Payer: Medicare Other | Source: Ambulatory Visit | Attending: Radiation Oncology | Admitting: Radiation Oncology

## 2015-04-15 ENCOUNTER — Other Ambulatory Visit (HOSPITAL_BASED_OUTPATIENT_CLINIC_OR_DEPARTMENT_OTHER): Payer: Medicare Other

## 2015-04-15 ENCOUNTER — Ambulatory Visit: Payer: Medicare Other | Admitting: Nutrition

## 2015-04-15 ENCOUNTER — Ambulatory Visit (HOSPITAL_BASED_OUTPATIENT_CLINIC_OR_DEPARTMENT_OTHER): Payer: Medicare Other

## 2015-04-15 VITALS — BP 121/61 | Temp 98.5°F | Resp 17

## 2015-04-15 DIAGNOSIS — C3411 Malignant neoplasm of upper lobe, right bronchus or lung: Secondary | ICD-10-CM

## 2015-04-15 DIAGNOSIS — Z5111 Encounter for antineoplastic chemotherapy: Secondary | ICD-10-CM

## 2015-04-15 DIAGNOSIS — C3491 Malignant neoplasm of unspecified part of right bronchus or lung: Secondary | ICD-10-CM

## 2015-04-15 DIAGNOSIS — Z510 Encounter for antineoplastic radiation therapy: Secondary | ICD-10-CM | POA: Diagnosis not present

## 2015-04-15 LAB — CBC WITH DIFFERENTIAL/PLATELET
BASO%: 0.4 % (ref 0.0–2.0)
Basophils Absolute: 0 10*3/uL (ref 0.0–0.1)
EOS%: 2.3 % (ref 0.0–7.0)
Eosinophils Absolute: 0.1 10*3/uL (ref 0.0–0.5)
HCT: 40.8 % (ref 38.4–49.9)
HEMOGLOBIN: 13.9 g/dL (ref 13.0–17.1)
LYMPH#: 0.4 10*3/uL — AB (ref 0.9–3.3)
LYMPH%: 14.4 % (ref 14.0–49.0)
MCH: 27 pg — ABNORMAL LOW (ref 27.2–33.4)
MCHC: 34.1 g/dL (ref 32.0–36.0)
MCV: 79.4 fL (ref 79.3–98.0)
MONO#: 0.2 10*3/uL (ref 0.1–0.9)
MONO%: 8.7 % (ref 0.0–14.0)
NEUT#: 2 10*3/uL (ref 1.5–6.5)
NEUT%: 74.2 % (ref 39.0–75.0)
Platelets: 149 10*3/uL (ref 140–400)
RBC: 5.14 10*6/uL (ref 4.20–5.82)
RDW: 16.2 % — ABNORMAL HIGH (ref 11.0–14.6)
WBC: 2.6 10*3/uL — AB (ref 4.0–10.3)

## 2015-04-15 LAB — COMPREHENSIVE METABOLIC PANEL (CC13)
ALT: 25 U/L (ref 0–55)
AST: 20 U/L (ref 5–34)
Albumin: 3.4 g/dL — ABNORMAL LOW (ref 3.5–5.0)
Alkaline Phosphatase: 86 U/L (ref 40–150)
Anion Gap: 8 mEq/L (ref 3–11)
BILIRUBIN TOTAL: 0.39 mg/dL (ref 0.20–1.20)
BUN: 12.3 mg/dL (ref 7.0–26.0)
CALCIUM: 8.7 mg/dL (ref 8.4–10.4)
CHLORIDE: 107 meq/L (ref 98–109)
CO2: 25 mEq/L (ref 22–29)
CREATININE: 1 mg/dL (ref 0.7–1.3)
Glucose: 105 mg/dl (ref 70–140)
POTASSIUM: 4.3 meq/L (ref 3.5–5.1)
Sodium: 140 mEq/L (ref 136–145)
Total Protein: 6.3 g/dL — ABNORMAL LOW (ref 6.4–8.3)

## 2015-04-15 MED ORDER — FAMOTIDINE IN NACL 20-0.9 MG/50ML-% IV SOLN
INTRAVENOUS | Status: AC
  Filled 2015-04-15: qty 50

## 2015-04-15 MED ORDER — SODIUM CHLORIDE 0.9 % IV SOLN
Freq: Once | INTRAVENOUS | Status: AC
  Administered 2015-04-15: 10:00:00 via INTRAVENOUS
  Filled 2015-04-15: qty 8

## 2015-04-15 MED ORDER — PACLITAXEL CHEMO INJECTION 300 MG/50ML
45.0000 mg/m2 | Freq: Once | INTRAVENOUS | Status: AC
  Administered 2015-04-15: 96 mg via INTRAVENOUS
  Filled 2015-04-15: qty 16

## 2015-04-15 MED ORDER — DIPHENHYDRAMINE HCL 50 MG/ML IJ SOLN
INTRAMUSCULAR | Status: AC
  Filled 2015-04-15: qty 1

## 2015-04-15 MED ORDER — DIPHENHYDRAMINE HCL 50 MG/ML IJ SOLN
25.0000 mg | Freq: Once | INTRAMUSCULAR | Status: AC
  Administered 2015-04-15: 25 mg via INTRAVENOUS

## 2015-04-15 MED ORDER — FAMOTIDINE IN NACL 20-0.9 MG/50ML-% IV SOLN
20.0000 mg | Freq: Once | INTRAVENOUS | Status: AC
  Administered 2015-04-15: 20 mg via INTRAVENOUS

## 2015-04-15 MED ORDER — SODIUM CHLORIDE 0.9 % IV SOLN
220.0000 mg | Freq: Once | INTRAVENOUS | Status: AC
  Administered 2015-04-15: 220 mg via INTRAVENOUS
  Filled 2015-04-15: qty 22

## 2015-04-15 MED ORDER — SODIUM CHLORIDE 0.9 % IV SOLN
Freq: Once | INTRAVENOUS | Status: AC
  Administered 2015-04-15: 10:00:00 via INTRAVENOUS

## 2015-04-15 NOTE — Progress Notes (Signed)
Nutrition follow-up completed with patient receiving concurrent chemoradiation therapy for lung cancer. Weight improved and documented as 204.5 pounds August 5 from 199.8 pounds July 25. Patient denies painful swallowing as well as other nutrition impact symptoms. Continues to eat well and is not consuming oral nutrition supplements.  Nutrition diagnosis: Food and nutrition related knowledge deficit resolved.  Enforced the importance of continued frequent meals and snacks to strive for weight maintenance. Encouraged patient to contact me if he develops any questions or concerns. He has my contact information.  **Disclaimer: This note was dictated with voice recognition software. Similar sounding words can inadvertently be transcribed and this note may contain transcription errors which may not have been corrected upon publication of note.**

## 2015-04-15 NOTE — Progress Notes (Signed)
OK to treat with today's CBC/CMET

## 2015-04-16 ENCOUNTER — Ambulatory Visit
Admission: RE | Admit: 2015-04-16 | Discharge: 2015-04-16 | Disposition: A | Discharge status: Home / Self Care | Payer: Medicare Other | Source: Ambulatory Visit | Attending: Radiation Oncology | Admitting: Radiation Oncology

## 2015-04-16 ENCOUNTER — Ambulatory Visit: Payer: Medicare Other

## 2015-04-16 DIAGNOSIS — Z510 Encounter for antineoplastic radiation therapy: Secondary | ICD-10-CM | POA: Diagnosis not present

## 2015-04-17 ENCOUNTER — Ambulatory Visit
Admission: RE | Admit: 2015-04-17 | Discharge: 2015-04-17 | Disposition: A | Discharge status: Home / Self Care | Payer: Medicare Other | Source: Ambulatory Visit | Attending: Radiation Oncology | Admitting: Radiation Oncology

## 2015-04-17 DIAGNOSIS — Z510 Encounter for antineoplastic radiation therapy: Secondary | ICD-10-CM | POA: Diagnosis not present

## 2015-04-18 ENCOUNTER — Ambulatory Visit: Payer: Medicare Other | Attending: Internal Medicine

## 2015-04-18 ENCOUNTER — Ambulatory Visit
Admission: RE | Admit: 2015-04-18 | Discharge: 2015-04-18 | Disposition: A | Discharge status: Home / Self Care | Payer: Medicare Other | Source: Ambulatory Visit | Attending: Radiation Oncology | Admitting: Radiation Oncology

## 2015-04-18 ENCOUNTER — Encounter: Payer: Self-pay | Admitting: Radiation Oncology

## 2015-04-18 VITALS — BP 131/65 | HR 72 | Resp 16 | Wt 207.8 lb

## 2015-04-18 DIAGNOSIS — Z510 Encounter for antineoplastic radiation therapy: Secondary | ICD-10-CM | POA: Diagnosis not present

## 2015-04-18 DIAGNOSIS — C3411 Malignant neoplasm of upper lobe, right bronchus or lung: Secondary | ICD-10-CM

## 2015-04-18 DIAGNOSIS — R531 Weakness: Secondary | ICD-10-CM | POA: Diagnosis present

## 2015-04-18 DIAGNOSIS — R262 Difficulty in walking, not elsewhere classified: Secondary | ICD-10-CM | POA: Insufficient documentation

## 2015-04-18 DIAGNOSIS — R0600 Dyspnea, unspecified: Secondary | ICD-10-CM | POA: Insufficient documentation

## 2015-04-18 DIAGNOSIS — R29898 Other symptoms and signs involving the musculoskeletal system: Secondary | ICD-10-CM | POA: Diagnosis present

## 2015-04-18 DIAGNOSIS — Z9181 History of falling: Secondary | ICD-10-CM | POA: Diagnosis present

## 2015-04-18 NOTE — Progress Notes (Signed)
  Radiation Oncology         [phone]   Name: Doc Mandala MRN: [id number]   Date: 04/18/2015  DOB: December 02, 1945   Weekly Radiation Therapy Management    Diagnosis: Colton Nguyen is a 69 year old gentleman presenting to clinic in regards to his Stage IIIA (T3, N2, M0) non-small cell lung cancer, squamous cell carcinoma.  Current Dose: 48 Gy  Planned Dose:  66 Gy  Narrative The patient presents for routine under treatment assessment. Denies pain. Reports frequency of dry cough is less. Reports SOB is less. Reports he is breathing better than he was when he initial started radiation treatment. Denies painful or difficult swallowing. Reports faint hyperpigmentation with desquamation within treatment field. Reports using radiaplex as directed. Denies swallowing difficulties.  Colton Nguyen is also receiving chemotherapy with medical oncology.Set-up films were reviewed and the chart was checked.  Physical Findings  weight is 207 lb 12.8 oz (94.257 kg). His blood pressure is 131/65 and his pulse is 72. His respiration is 16. Marland Kitchen His weight is essentially stable with no significant changes to his overall state of health to note at this time.  Impression The patient is tolerating radiation.  Plan The patient was advised of their follow up appointment with radiation oncology to take place next week. The patient has been advised to continue treatment as planned.   This document serves as a record of services personally performed by Tyler Pita, MD. It was created on his behalf by Darcus Austin, a trained medical scribe. The creation of this record is based on the scribe's personal observations and the provider's statements to them. This document has been checked and approved by the attending provider.     _________________________________________   Sheral Apley. Tammi Klippel, M.D.

## 2015-04-18 NOTE — Progress Notes (Signed)
Weight and vitals stable. Denies pain. Reports frequency of dry cough is less. Reports SOB is less. Reports he is breathing better than he was when he initial started radiation treatment. Denies painful or difficult swallowing. Reports faint hyperpigmentation with desquamation within treatment field. Reports using radiaplex as directed.  BP 131/65 mmHg  Pulse 72  Resp 16  Wt 207 lb 12.8 oz (94.257 kg) Wt Readings from Last 3 Encounters:  04/18/15 207 lb 12.8 oz (94.257 kg)  04/12/15 204 lb 8 oz (92.761 kg)  04/08/15 200 lb 11.2 oz (91.037 kg)

## 2015-04-18 NOTE — Patient Instructions (Signed)
Hip Abduction (Standing)   Stand with support. Squeeze pelvic floor and hold. Lift right leg out to side, keeping toe forward. Hold for _3__ seconds.  Repeat _10__ times, 2-3 sets at a time.. Do _2__ times a day. Repeat with other leg. Add __2-4_ lb weight.   EXTENSION: Standing (Active)   Stand, both feet flat. Draw right leg behind body as far as possible. Use _2-4__ lbs. Complete _2-3__ sets of _10__ repetitions. Perform _2__ sessions per day.  http://gtsc.exer.us/76   Copyright  VHI. All rights reserved.    Copyright  VHI. All rights reserved.   Hip Flexion (Standing)   Stand with support. Squeeze pelvic floor and hold. Lift right knee upward. Hold for _3__ seconds.  Repeat _10__ times, 2-3 sets at a time. Do _2__ times a day. Repeat with other leg. Add _2-4__ lb weight.    Copyright  VHI. All rights reserved.   Heel Raise: Bilateral (Standing)   Rise on balls of feet. Repeat __20__ times per set. Do __1-2__ sets per session. Do _2___ sessions per day.  http://orth.exer.us/38   Copyright  VHI. All rights reserved.   Strengthening: Wall Slide   Leaning on wall, slowly lower buttocks until thighs are parallel to floor. Hold __5__ seconds. Tighten thigh muscles and return. Repeat _10___ times per set. Do _1-2___ sets per session. Do _2___ sessions per day.  http://orth.exer.us/630   Copyright  VHI. All rights reserved.   Bridging   Slowly raise buttocks from floor, keeping stomach tight. Repeat _10___ times per set. Do _1-2___ sets per session. Do __2__ sessions per day.  http://orth.exer.us/1096   Copyright  VHI. All rights reserved.   Hip Flexion / Knee Extension: Straight-Leg Raise (Eccentric)   Lie on back. Lift leg with knee straight. Slowly lower leg for 3-5 seconds. _10__ reps per set, 1-2 sets at a time,  __2_ sets per day.   Copyright  VHI. All rights reserved.        Marland Kitchen

## 2015-04-18 NOTE — Therapy (Signed)
[place], [place], [address] Phone: [phone]   Fax:  [phone]  Physical Therapy Treatment  Patient Details  Name: Colton Nguyen MRN: [id number] Date of Birth: 18-Aug-1946 Referring Provider:  Curt Bears, MD  Encounter Date: 04/18/2015      PT End of Session - 04/18/15 1002    Visit Number 3   Number of Visits 18   Date for PT Re-Evaluation 05/30/15   PT Start Time 0855   PT Stop Time 0930   PT Time Calculation (min) 35 min   Activity Tolerance Patient tolerated treatment well   Behavior During Therapy [facility] for tasks assessed/performed      Past Medical History  Diagnosis Date  . CHF (congestive heart failure)   . Hypertension   . Hyperlipidemia   . BPH (benign prostatic hyperplasia)   . CAD (coronary artery disease)   . Chronic cardiopulmonary disease   . Nicotine dependence   . Mass of lung   . COPD (chronic obstructive pulmonary disease)   . Shortness of breath dyspnea     with exertion  . Anxiety     situational  . Personal history of kidney stones   . Restless leg     Past Surgical History  Procedure Laterality Date  . Cad ccta 01/31/15    . Right inguinal hernia repair at age 45    . Colonoscopy    . Cervical fusion    . Back surgery    . Video bronchoscopy with endobronchial ultrasound N/A 02/27/2015    Procedure: VIDEO BRONCHOSCOPY WITH ENDOBRONCHIAL ULTRASOUND;  Surgeon: Grace Isaac, MD;  Location: [place];  Service: Thoracic;  Laterality: N/A;  . Lung biopsy N/A 02/27/2015    Procedure: LUNG BIOPSY;  Surgeon: Grace Isaac, MD;  Location: [place];  Service: Thoracic;  Laterality: N/A;    There were no vitals filed for this visit.  Visit Diagnosis:  Generalized weakness  Dyspnea  Difficulty walking  Personal history of fall  Weakness of right lower extremity      Subjective Assessment - 04/18/15 0858    Subjective Running late from  radiation again today, saw the doctor after my appt. Feel fine today other than a little sluggish.   Currently in Pain? No/denies                         [facility] Adult PT Treatment/Exercise - 04/18/15 0001    Knee/Hip Exercises: Aerobic   Nustep Level 3, 7 minutes   Knee/Hip Exercises: Machines for Strengthening   Cybex Leg Press 60 lbs, 20 reps   Knee/Hip Exercises: Standing   Heel Raises Both;20 reps   Hip Flexion AROM;Stengthening;Both;2 sets;10 reps  2 lbs on each ankle   Hip Abduction AROM;Stengthening;Both;2 sets;10 reps  2 lbs on each ankle   Hip Extension AROM;Stengthening;Both;2 sets;10 reps  2 lbs on each ankle   Wall Squat 10 reps;5 seconds   Knee/Hip Exercises: Supine   Bridges Strengthening;Both;10 reps   Straight Leg Raises Strengthening;Both;10 reps                PT Education - 04/18/15 1002    Education provided Yes   Education Details Standing and supine LE strength   Person(s) Educated Patient   Methods Explanation;Demonstration;Handout   Comprehension Verbalized understanding;Returned demonstration;Need further instruction           Short Term Clinic Goals - 04/18/15 1005  CC Short Term Goal  #1   Title Patient will be able to tolerate his initial home exercise program to increase leg strength  Issued today 04/18/15   Status Partially Met   CC Short Term Goal  #2   Title Patient will be able to improve his 6 minute walk test to report BORG Dyspnea score of </=2 (mild shortness of breath)   Status On-going   CC Short Term Goal  #3   Title Patient will be able to improve his 6 minute walk test to report BORG ratings of perceived exertion scale score of </=11 (fairly light)   Status On-going   CC Short Term Goal  #4   Title Patient will be able to report a >/= 25% improvement in his ability to tolerate household and daily tasks without feeling fatigued or needing to rest.   Status On-going            Lung Clinic Goals -  03/07/15 1527    Patient will be able to verbalize understanding of the benefit of exercise to decrease fatigue.   Status Achieved   Patient will be able to verbalize the importance of posture.   Status Achieved   Patient will be able to demonstrate diaphragmatic breathing for improved lung function.   Status Achieved   Patient will be able to verbalize understanding of the role of physical therapy to prevent functional decline and who to contact if physical therapy is needed.   Status Achieved         Long Term Clinic Goals - 04/04/15 1219    CC Long Term Goal  #1   Title Patient will be able to perform his final home exercise program to increase leg strength and verbalize understanding of safe self-progression   Time 8   Period Weeks   Status New   CC Long Term Goal  #2   Title Patient will be able to improve his 6 minute walk test to report BORG Dyspnea score of </=1 (very mild shortness of breath)   Time 8   Period Weeks   Status New   CC Long Term Goal  #3   Title Patient will be able to improve his 6 minute walk test to report BORG ratings of perceived exertion scale score of </=9 (veryy light)   Time 8   Period Weeks   Status New   CC Long Term Goal  #4   Title Patient will be able to report a >/= 50% improvement in his ability to tolerate household and daily tasks without feeling fatigued or needing to rest.   Time 8   Period Weeks   Status New   CC Long Term Goal  #5   Title Patient will be able to increase bilateral leg strength to >/= 4/5 for increased ease with ambulation and squatting   Time 8   Period Weeks   Status New            Plan - 04/18/15 1002    Clinical Impression Statement Pt arrived late from radiation and was ready to stop by end of treatment due to LE fatigue. Overall he tolerated therapy very well and demonstrated good understanding of breathing techniques with exercises and importance of deep breathing intermittently throughout day.    Pt  will benefit from skilled therapeutic intervention in order to improve on the following deficits Decreased strength;Difficulty walking;Decreased activity tolerance;Decreased endurance   Rehab Potential Excellent   Clinical Impairments Affecting Rehab Potential Current  chemoradiation treatment   PT Frequency 2x / week   PT Duration 8 weeks   PT Treatment/Interventions Patient/family education;Energy conservation;Therapeutic exercise;Functional mobility training;ADLs/Self Care Home Management   PT Next Visit Plan Continue overall strengthening: and review/progress LE strength HEP prn.    PT Home Exercise Plan LE HEP   Consulted and Agree with Plan of Care Patient        Problem List Patient Active Problem List   Diagnosis Date Noted  . Smoking trying to quit 04/01/2015  . Squamous cell carcinoma of lung, stage III 03/07/2015  . spiculated right upper lobe mass 02/14/2015    Otelia Limes, PTA 04/18/2015, 10:06 AM  [place], [place], [address] Phone: [phone]   Fax:  [phone]

## 2015-04-19 ENCOUNTER — Ambulatory Visit
Admission: RE | Admit: 2015-04-19 | Discharge: 2015-04-19 | Disposition: A | Discharge status: Home / Self Care | Payer: Medicare Other | Source: Ambulatory Visit | Attending: Radiation Oncology | Admitting: Radiation Oncology

## 2015-04-19 DIAGNOSIS — Z510 Encounter for antineoplastic radiation therapy: Secondary | ICD-10-CM | POA: Diagnosis not present

## 2015-04-22 ENCOUNTER — Ambulatory Visit (HOSPITAL_BASED_OUTPATIENT_CLINIC_OR_DEPARTMENT_OTHER): Payer: Medicare Other

## 2015-04-22 ENCOUNTER — Other Ambulatory Visit (HOSPITAL_BASED_OUTPATIENT_CLINIC_OR_DEPARTMENT_OTHER): Payer: Medicare Other

## 2015-04-22 ENCOUNTER — Ambulatory Visit
Admission: RE | Admit: 2015-04-22 | Discharge: 2015-04-22 | Disposition: A | Discharge status: Home / Self Care | Payer: Medicare Other | Source: Ambulatory Visit | Attending: Radiation Oncology | Admitting: Radiation Oncology

## 2015-04-22 VITALS — BP 107/69 | HR 62 | Temp 98.4°F | Resp 16

## 2015-04-22 DIAGNOSIS — C3491 Malignant neoplasm of unspecified part of right bronchus or lung: Secondary | ICD-10-CM

## 2015-04-22 DIAGNOSIS — C3411 Malignant neoplasm of upper lobe, right bronchus or lung: Secondary | ICD-10-CM

## 2015-04-22 DIAGNOSIS — Z5111 Encounter for antineoplastic chemotherapy: Secondary | ICD-10-CM

## 2015-04-22 DIAGNOSIS — Z510 Encounter for antineoplastic radiation therapy: Secondary | ICD-10-CM | POA: Diagnosis not present

## 2015-04-22 LAB — COMPREHENSIVE METABOLIC PANEL (CC13)
ALK PHOS: 88 U/L (ref 40–150)
ALT: 26 U/L (ref 0–55)
ANION GAP: 6 meq/L (ref 3–11)
AST: 20 U/L (ref 5–34)
Albumin: 3.4 g/dL — ABNORMAL LOW (ref 3.5–5.0)
BUN: 9 mg/dL (ref 7.0–26.0)
CALCIUM: 8.4 mg/dL (ref 8.4–10.4)
CHLORIDE: 108 meq/L (ref 98–109)
CO2: 24 mEq/L (ref 22–29)
Creatinine: 0.9 mg/dL (ref 0.7–1.3)
Glucose: 99 mg/dl (ref 70–140)
POTASSIUM: 4.4 meq/L (ref 3.5–5.1)
Sodium: 137 mEq/L (ref 136–145)
Total Bilirubin: 0.42 mg/dL (ref 0.20–1.20)
Total Protein: 6.1 g/dL — ABNORMAL LOW (ref 6.4–8.3)

## 2015-04-22 LAB — CBC WITH DIFFERENTIAL/PLATELET
BASO%: 0.8 % (ref 0.0–2.0)
Basophils Absolute: 0 10*3/uL (ref 0.0–0.1)
EOS ABS: 0.1 10*3/uL (ref 0.0–0.5)
EOS%: 2.6 % (ref 0.0–7.0)
HEMATOCRIT: 41.1 % (ref 38.4–49.9)
HGB: 13.5 g/dL (ref 13.0–17.1)
LYMPH#: 0.3 10*3/uL — AB (ref 0.9–3.3)
LYMPH%: 12.3 % — ABNORMAL LOW (ref 14.0–49.0)
MCH: 26.4 pg — ABNORMAL LOW (ref 27.2–33.4)
MCHC: 32.8 g/dL (ref 32.0–36.0)
MCV: 80.4 fL (ref 79.3–98.0)
MONO#: 0.2 10*3/uL (ref 0.1–0.9)
MONO%: 7.8 % (ref 0.0–14.0)
NEUT#: 1.9 10*3/uL (ref 1.5–6.5)
NEUT%: 76.5 % — AB (ref 39.0–75.0)
PLATELETS: 126 10*3/uL — AB (ref 140–400)
RBC: 5.11 10*6/uL (ref 4.20–5.82)
RDW: 16.5 % — ABNORMAL HIGH (ref 11.0–14.6)
WBC: 2.5 10*3/uL — ABNORMAL LOW (ref 4.0–10.3)

## 2015-04-22 MED ORDER — DEXTROSE 5 % IV SOLN
45.0000 mg/m2 | Freq: Once | INTRAVENOUS | Status: AC
  Administered 2015-04-22: 96 mg via INTRAVENOUS
  Filled 2015-04-22: qty 16

## 2015-04-22 MED ORDER — FAMOTIDINE IN NACL 20-0.9 MG/50ML-% IV SOLN
INTRAVENOUS | Status: AC
  Filled 2015-04-22: qty 50

## 2015-04-22 MED ORDER — FAMOTIDINE IN NACL 20-0.9 MG/50ML-% IV SOLN
20.0000 mg | Freq: Once | INTRAVENOUS | Status: AC
  Administered 2015-04-22: 20 mg via INTRAVENOUS

## 2015-04-22 MED ORDER — SODIUM CHLORIDE 0.9 % IV SOLN
Freq: Once | INTRAVENOUS | Status: AC
  Administered 2015-04-22: 10:00:00 via INTRAVENOUS
  Filled 2015-04-22: qty 8

## 2015-04-22 MED ORDER — DIPHENHYDRAMINE HCL 50 MG/ML IJ SOLN
25.0000 mg | Freq: Once | INTRAMUSCULAR | Status: AC
  Administered 2015-04-22: 25 mg via INTRAVENOUS

## 2015-04-22 MED ORDER — DIPHENHYDRAMINE HCL 50 MG/ML IJ SOLN
INTRAMUSCULAR | Status: AC
  Filled 2015-04-22: qty 1

## 2015-04-22 MED ORDER — SODIUM CHLORIDE 0.9 % IV SOLN
Freq: Once | INTRAVENOUS | Status: AC
  Administered 2015-04-22: 10:00:00 via INTRAVENOUS

## 2015-04-22 MED ORDER — CARBOPLATIN CHEMO INJECTION 450 MG/45ML
218.0000 mg | Freq: Once | INTRAVENOUS | Status: AC
  Administered 2015-04-22: 220 mg via INTRAVENOUS
  Filled 2015-04-22: qty 22

## 2015-04-22 NOTE — Patient Instructions (Signed)
[facility] Discharge Instructions for Patients Receiving Chemotherapy  Today you received the following chemotherapy agents Taxol/Carboplatin.  To help prevent nausea and vomiting after your treatment, we encourage you to take your nausea medication as directed.   If you develop nausea and vomiting that is not controlled by your nausea medication, call the clinic.   BELOW ARE SYMPTOMS THAT SHOULD BE REPORTED IMMEDIATELY:  *FEVER GREATER THAN 100.5 F  *CHILLS WITH OR WITHOUT FEVER  NAUSEA AND VOMITING THAT IS NOT CONTROLLED WITH YOUR NAUSEA MEDICATION  *UNUSUAL SHORTNESS OF BREATH  *UNUSUAL BRUISING OR BLEEDING  TENDERNESS IN MOUTH AND THROAT WITH OR WITHOUT PRESENCE OF ULCERS  *URINARY PROBLEMS  *BOWEL PROBLEMS  UNUSUAL RASH Items with * indicate a potential emergency and should be followed up as soon as possible.  Feel free to call the clinic you have any questions or concerns. The clinic phone number is [phone].  Please show the [facility] at check-in to the Emergency Department and triage nurse.

## 2015-04-23 ENCOUNTER — Ambulatory Visit: Payer: Medicare Other

## 2015-04-23 ENCOUNTER — Ambulatory Visit
Admission: RE | Admit: 2015-04-23 | Discharge: 2015-04-23 | Disposition: A | Discharge status: Home / Self Care | Payer: Medicare Other | Source: Ambulatory Visit | Attending: Radiation Oncology | Admitting: Radiation Oncology

## 2015-04-23 DIAGNOSIS — R262 Difficulty in walking, not elsewhere classified: Secondary | ICD-10-CM

## 2015-04-23 DIAGNOSIS — R0600 Dyspnea, unspecified: Secondary | ICD-10-CM

## 2015-04-23 DIAGNOSIS — R531 Weakness: Secondary | ICD-10-CM

## 2015-04-23 DIAGNOSIS — R29898 Other symptoms and signs involving the musculoskeletal system: Secondary | ICD-10-CM

## 2015-04-23 DIAGNOSIS — Z510 Encounter for antineoplastic radiation therapy: Secondary | ICD-10-CM | POA: Diagnosis not present

## 2015-04-23 DIAGNOSIS — Z9181 History of falling: Secondary | ICD-10-CM

## 2015-04-23 NOTE — Therapy (Signed)
[place], [place], [address] Phone: [phone]   Fax:  [phone]  Physical Therapy Treatment  Patient Details  Name: Colton Nguyen MRN: [id number] Date of Birth: May 13, 1946 Referring Provider:  Curt Bears, MD  Encounter Date: 04/23/2015      PT End of Session - 04/23/15 0927    Visit Number 4   Number of Visits 18   Date for PT Re-Evaluation 05/30/15   PT Start Time 0849   PT Stop Time 0931   PT Time Calculation (min) 42 min   Activity Tolerance Patient tolerated treatment well   Behavior During Therapy [facility] for tasks assessed/performed      Past Medical History  Diagnosis Date  . CHF (congestive heart failure)   . Hypertension   . Hyperlipidemia   . BPH (benign prostatic hyperplasia)   . CAD (coronary artery disease)   . Chronic cardiopulmonary disease   . Nicotine dependence   . Mass of lung   . COPD (chronic obstructive pulmonary disease)   . Shortness of breath dyspnea     with exertion  . Anxiety     situational  . Personal history of kidney stones   . Restless leg     Past Surgical History  Procedure Laterality Date  . Cad ccta 01/31/15    . Right inguinal hernia repair at age 24    . Colonoscopy    . Cervical fusion    . Back surgery    . Video bronchoscopy with endobronchial ultrasound N/A 02/27/2015    Procedure: VIDEO BRONCHOSCOPY WITH ENDOBRONCHIAL ULTRASOUND;  Surgeon: Grace Isaac, MD;  Location: [place];  Service: Thoracic;  Laterality: N/A;  . Lung biopsy N/A 02/27/2015    Procedure: LUNG BIOPSY;  Surgeon: Grace Isaac, MD;  Location: [place];  Service: Thoracic;  Laterality: N/A;    There were no vitals filed for this visit.  Visit Diagnosis:  Generalized weakness  Dyspnea  Difficulty walking  Personal history of fall  Weakness of right lower extremity      Subjective Assessment - 04/23/15 0852    Subjective Doing good, felt okay  after last visit. Tired but not unreasonably so.    Currently in Pain? No/denies                         [facility] Adult PT Treatment/Exercise - 04/23/15 0001    Knee/Hip Exercises: Aerobic   Nustep Level 3, 7 minutes, level 4, 1 minute   Knee/Hip Exercises: Machines for Strengthening   Cybex Leg Press 60 lbs, 20 reps   Knee/Hip Exercises: Standing   Hip Flexion AROM;Stengthening;Both;10 reps;1 set  2 lbs each ankle   Hip Abduction AROM;Stengthening;Both;2 sets;10 reps  2 lbs each ankle   Hip Extension AROM;Stengthening;Both;10 reps;1 set  2 lbs each ankle   Forward Step Up Both;10 reps;Step Height: 4"  Intermittent fingertip support   Forward Step Up Limitations Pt very wobbly but able to self control   Step Down Both;10 reps;Hand Hold: 1  Heel tap   Wall Squat 10 reps;3 seconds   Knee/Hip Exercises: Seated   Long Arc Quad Strengthening;Both;10 reps;Weights   Long Arc Quad Weight 2 lbs.   Marching Strengthening;Both;4 sets;10 reps;Weights   Marching Weights 2 lbs.   Knee/Hip Exercises: Supine   Bridges Strengthening;Both;10 reps   Straight Leg Raises Strengthening;Both;10 reps  Short Term Clinic Goals - 04/23/15 0933    CC Short Term Goal  #1   Title Patient will be able to tolerate his initial home exercise program to increase leg strength   Status Partially Met   CC Short Term Goal  #2   Title Patient will be able to improve his 6 minute walk test to report BORG Dyspnea score of </=2 (mild shortness of breath)   Status On-going   CC Short Term Goal  #3   Title Patient will be able to improve his 6 minute walk test to report BORG ratings of perceived exertion scale score of </=11 (fairly light)   CC Short Term Goal  #4   Title Patient will be able to report a >/= 25% improvement in his ability to tolerate household and daily tasks without feeling fatigued or needing to rest.   Status On-going            Lung Clinic Goals -  03/07/15 1527    Patient will be able to verbalize understanding of the benefit of exercise to decrease fatigue.   Status Achieved   Patient will be able to verbalize the importance of posture.   Status Achieved   Patient will be able to demonstrate diaphragmatic breathing for improved lung function.   Status Achieved   Patient will be able to verbalize understanding of the role of physical therapy to prevent functional decline and who to contact if physical therapy is needed.   Status Achieved         Long Term Clinic Goals - 04/04/15 1219    CC Long Term Goal  #1   Title Patient will be able to perform his final home exercise program to increase leg strength and verbalize understanding of safe self-progression   Time 8   Period Weeks   Status New   CC Long Term Goal  #2   Title Patient will be able to improve his 6 minute walk test to report BORG Dyspnea score of </=1 (very mild shortness of breath)   Time 8   Period Weeks   Status New   CC Long Term Goal  #3   Title Patient will be able to improve his 6 minute walk test to report BORG ratings of perceived exertion scale score of </=9 (veryy light)   Time 8   Period Weeks   Status New   CC Long Term Goal  #4   Title Patient will be able to report a >/= 50% improvement in his ability to tolerate household and daily tasks without feeling fatigued or needing to rest.   Time 8   Period Weeks   Status New   CC Long Term Goal  #5   Title Patient will be able to increase bilateral leg strength to >/= 4/5 for increased ease with ambulation and squatting   Time 8   Period Weeks   Status New            Plan - 04/23/15 1610    Clinical Impression Statement Pt tolerated treatment well today, with needing 4 seated rest breaks throughout. He reports HEP is going well. Pt did have soem difficulty with SLS with step ups and he was surprised by this and wants to work balance as well.    Pt will benefit from skilled therapeutic  intervention in order to improve on the following deficits Decreased strength;Difficulty walking;Decreased activity tolerance;Decreased endurance   Rehab Potential Excellent   Clinical Impairments Affecting Rehab  Potential Current chemoradiation treatment   PT Frequency 2x / week   PT Duration 8 weeks   PT Treatment/Interventions Patient/family education;Energy conservation;Therapeutic exercise;Functional mobility training;ADLs/Self Care Home Management   PT Next Visit Plan Continue overall strengthening: and review/progress LE strength HEP prn. Pt would like to focus on balance as well some; try resisted walking next visit.    Consulted and Agree with Plan of Care Patient        Problem List Patient Active Problem List   Diagnosis Date Noted  . Smoking trying to quit 04/01/2015  . Squamous cell carcinoma of lung, stage III 03/07/2015  . spiculated right upper lobe mass 02/14/2015    Otelia Limes, PTA 04/23/2015, 9:35 AM  [place], [place], [address] Phone: [phone]   Fax:  [phone]

## 2015-04-24 ENCOUNTER — Ambulatory Visit
Admission: RE | Admit: 2015-04-24 | Discharge: 2015-04-24 | Disposition: A | Discharge status: Home / Self Care | Payer: Medicare Other | Source: Ambulatory Visit | Attending: Radiation Oncology | Admitting: Radiation Oncology

## 2015-04-24 DIAGNOSIS — Z510 Encounter for antineoplastic radiation therapy: Secondary | ICD-10-CM | POA: Diagnosis not present

## 2015-04-25 ENCOUNTER — Ambulatory Visit: Payer: Medicare Other

## 2015-04-25 ENCOUNTER — Ambulatory Visit
Admission: RE | Admit: 2015-04-25 | Discharge: 2015-04-25 | Disposition: A | Discharge status: Home / Self Care | Payer: Medicare Other | Source: Ambulatory Visit | Attending: Radiation Oncology | Admitting: Radiation Oncology

## 2015-04-25 DIAGNOSIS — R0600 Dyspnea, unspecified: Secondary | ICD-10-CM

## 2015-04-25 DIAGNOSIS — R531 Weakness: Secondary | ICD-10-CM | POA: Diagnosis not present

## 2015-04-25 DIAGNOSIS — Z510 Encounter for antineoplastic radiation therapy: Secondary | ICD-10-CM | POA: Diagnosis not present

## 2015-04-25 DIAGNOSIS — Z9181 History of falling: Secondary | ICD-10-CM

## 2015-04-25 DIAGNOSIS — R262 Difficulty in walking, not elsewhere classified: Secondary | ICD-10-CM

## 2015-04-25 DIAGNOSIS — R29898 Other symptoms and signs involving the musculoskeletal system: Secondary | ICD-10-CM

## 2015-04-25 NOTE — Therapy (Signed)
[place], [place], [address] Phone: [phone]   Fax:  [phone]  Physical Therapy Treatment  Patient Details  Name: Colton Nguyen MRN: [id number] Date of Birth: [birth year] Referring Provider:  Curt Bears, MD  Encounter Date: 04/25/2015      PT End of Session - 04/25/15 0906    Visit Number 5   Number of Visits 18   Date for PT Re-Evaluation 05/30/15   PT Start Time 0852   PT Stop Time 0932   PT Time Calculation (min) 40 min   Activity Tolerance Patient tolerated treatment well   Behavior During Therapy [facility] for tasks assessed/performed      Past Medical History  Diagnosis Date  . CHF (congestive heart failure)   . Hypertension   . Hyperlipidemia   . BPH (benign prostatic hyperplasia)   . CAD (coronary artery disease)   . Chronic cardiopulmonary disease   . Nicotine dependence   . Mass of lung   . COPD (chronic obstructive pulmonary disease)   . Shortness of breath dyspnea     with exertion  . Anxiety     situational  . Personal history of kidney stones   . Restless leg     Past Surgical History  Procedure Laterality Date  . Cad ccta 01/31/15    . Right inguinal hernia repair at age 32    . Colonoscopy    . Cervical fusion    . Back surgery    . Video bronchoscopy with endobronchial ultrasound N/A 02/27/2015    Procedure: VIDEO BRONCHOSCOPY WITH ENDOBRONCHIAL ULTRASOUND;  Surgeon: Grace Isaac, MD;  Location: [facility];  Service: Thoracic;  Laterality: N/A;  . Lung biopsy N/A 02/27/2015    Procedure: LUNG BIOPSY;  Surgeon: Grace Isaac, MD;  Location: [place];  Service: Thoracic;  Laterality: N/A;    There were no vitals filed for this visit.  Visit Diagnosis:  Generalized weakness  Dyspnea  Difficulty walking  Personal history of fall  Weakness of right lower extremity      Subjective Assessment - 04/25/15 0856    Subjective Felt good after last  visit. Been doing my exercises at home.                         [facility] Adult PT Treatment/Exercise - 04/25/15 0001    Knee/Hip Exercises: Aerobic   Nustep Level 4 x minutes   Knee/Hip Exercises: Machines for Strengthening   Cybex Leg Press 60 lbs, 20 reps   Knee/Hip Exercises: Standing   Hip Flexion Stengthening;Both;20 reps  2 lbs added to each ankle   Hip Abduction Stengthening;Both;20 reps  2 lbs added to each ankle   Hip Extension Stengthening;Both;20 reps  2 lbs added to each ankle   Walking with Sports Cord 3 plates for all 3 directions with CGA-min A throughout: Forward 8 reps, bil side-side and backwards 5 reps each with 1 seated rest during   Knee/Hip Exercises: Seated   Long Arc Quad Strengthening;Both;10 reps;Weights   Long Arc Quad Weight 2 lbs.   Marching Strengthening;Both;4 sets;10 reps;Weights   Marching Weights 2 lbs.                   Short Term Clinic Goals - 04/23/15 6811    CC Short Term Goal  #1   Title Patient will be able to tolerate his initial home exercise program to increase leg strength  Status Partially Met   CC Short Term Goal  #2   Title Patient will be able to improve his 6 minute walk test to report BORG Dyspnea score of </=2 (mild shortness of breath)   Status On-going   CC Short Term Goal  #3   Title Patient will be able to improve his 6 minute walk test to report BORG ratings of perceived exertion scale score of </=11 (fairly light)   CC Short Term Goal  #4   Title Patient will be able to report a >/= 25% improvement in his ability to tolerate household and daily tasks without feeling fatigued or needing to rest.   Status On-going            Lung Clinic Goals - 03/07/15 1527    Patient will be able to verbalize understanding of the benefit of exercise to decrease fatigue.   Status Achieved   Patient will be able to verbalize the importance of posture.   Status Achieved   Patient will be able to  demonstrate diaphragmatic breathing for improved lung function.   Status Achieved   Patient will be able to verbalize understanding of the role of physical therapy to prevent functional decline and who to contact if physical therapy is needed.   Status Achieved         Long Term Clinic Goals - 04/04/15 1219    CC Long Term Goal  #1   Title Patient will be able to perform his final home exercise program to increase leg strength and verbalize understanding of safe self-progression   Time 8   Period Weeks   Status New   CC Long Term Goal  #2   Title Patient will be able to improve his 6 minute walk test to report BORG Dyspnea score of </=1 (very mild shortness of breath)   Time 8   Period Weeks   Status New   CC Long Term Goal  #3   Title Patient will be able to improve his 6 minute walk test to report BORG ratings of perceived exertion scale score of </=9 (veryy light)   Time 8   Period Weeks   Status New   CC Long Term Goal  #4   Title Patient will be able to report a >/= 50% improvement in his ability to tolerate household and daily tasks without feeling fatigued or needing to rest.   Time 8   Period Weeks   Status New   CC Long Term Goal  #5   Title Patient will be able to increase bilateral leg strength to >/= 4/5 for increased ease with ambulation and squatting   Time 8   Period Weeks   Status New            Plan - 04/25/15 2585    Clinical Impression Statement Pt reports minimal change with endurance noted yet with ADLs, but being compliant with HEP. Pt shows consistent improvement with tolerance of activities requiring less seated rest breaks and he reports less fatigue after session today as well. Progressing well towards goals   Pt will benefit from skilled therapeutic intervention in order to improve on the following deficits Decreased strength;Difficulty walking;Decreased activity tolerance;Decreased endurance   Rehab Potential Excellent   Clinical Impairments  Affecting Rehab Potential Current chemoradiation treatment   PT Frequency 2x / week   PT Duration 8 weeks   PT Next Visit Plan Pt will need to leave next appt at 0920 instead of 0930 due  to appt at hospital was changed to earlier. Cont progressing LE strength and proprioceptive activies as tolerated.    Consulted and Agree with Plan of Care Patient        Problem List Patient Active Problem List   Diagnosis Date Noted  . Smoking trying to quit 04/01/2015  . Squamous cell carcinoma of lung, stage III 03/07/2015  . spiculated right upper lobe mass 02/14/2015    Otelia Limes, PTA 04/25/2015, 9:32 AM  [place], [place], [address] Phone: [phone]   Fax:  [phone]

## 2015-04-26 ENCOUNTER — Encounter: Payer: Self-pay | Admitting: Radiation Oncology

## 2015-04-26 ENCOUNTER — Ambulatory Visit
Admission: RE | Admit: 2015-04-26 | Discharge: 2015-04-26 | Disposition: A | Discharge status: Home / Self Care | Payer: Medicare Other | Source: Ambulatory Visit | Attending: Radiation Oncology | Admitting: Radiation Oncology

## 2015-04-26 VITALS — BP 127/69 | HR 75 | Resp 16 | Wt 205.2 lb

## 2015-04-26 DIAGNOSIS — Z510 Encounter for antineoplastic radiation therapy: Secondary | ICD-10-CM | POA: Diagnosis not present

## 2015-04-26 DIAGNOSIS — C3411 Malignant neoplasm of upper lobe, right bronchus or lung: Secondary | ICD-10-CM

## 2015-04-26 NOTE — Progress Notes (Signed)
Weight and vitals stable. Denies pain. Reports he is breathing better with less SOB. Reports an occasional dry cough. Denies a sore throat or difficulty swallowing. Mild hyperpigmentation without desquamation within treatment field. Reports using Radiaplex bid as directed. Reports mild fatigue.  BP 127/69 mmHg  Pulse 75  Resp 16  Wt 205 lb 3.2 oz (93.078 kg)  SpO2 100% Wt Readings from Last 3 Encounters:  04/26/15 205 lb 3.2 oz (93.078 kg)  04/18/15 207 lb 12.8 oz (94.257 kg)  04/12/15 204 lb 8 oz (92.761 kg)

## 2015-04-26 NOTE — Progress Notes (Signed)
  Radiation Oncology         [phone]   Name: Colton Nguyen MRN: [id number]   Date: 04/26/2015  DOB: 03-05-1946   Weekly Radiation Therapy Management      ICD-9-CM ICD-10-CM   1. Squamous cell carcinoma of lung, stage III 162.3 C34.11    Diagnosis: Colton Nguyen is a 69 year old gentleman presenting to clinic in regards to his Stage IIIA (T3, N2, M0) non-small cell lung cancer, squamous cell carcinoma.  Current Dose: 60 Gy  Planned Dose:  66 Gy  Narrative The patient presents for routine under treatment assessment.  Weight and vitals stable. Denies pain. Reports he is breathing better with less SOB. Reports an occasional dry cough. Denies a sore throat or difficulty swallowing. Mild hyperpigmentation without desquamation within treatment field. Reports using Radiaplex bid as directed. Reports mild fatigue.  Colton Nguyen is also receiving chemotherapy with medical oncology.Set-up films were reviewed and the chart was checked.  Physical Findings  weight is 205 lb 3.2 oz (93.078 kg). His blood pressure is 127/69 and his pulse is 75. His respiration is 16 and oxygen saturation is 100%. Marland Kitchen His weight is essentially stable with no significant changes to his overall state of health to note at this time.  Impression The patient is tolerating radiation.  Plan The patient has been advised to continue treatment as planned. He will finish radiation next week and we will follow up in one month.  He is scheduled to see Dr. Lisbeth Renshaw on Wednesday when he finishes.   This document serves as a record of services personally performed by Tyler Pita, MD. It was created on his behalf by Arlyce Harman, a trained medical scribe. The creation of this record is based on the scribe's personal observations and the provider's statements to them. This document has been checked and approved by the attending provider.    _________________________________________   Sheral Apley. Tammi Klippel, M.D.

## 2015-04-29 ENCOUNTER — Encounter: Payer: Self-pay | Admitting: Physician Assistant

## 2015-04-29 ENCOUNTER — Ambulatory Visit
Admission: RE | Admit: 2015-04-29 | Discharge: 2015-04-29 | Disposition: A | Discharge status: Home / Self Care | Payer: Medicare Other | Source: Ambulatory Visit | Attending: Radiation Oncology | Admitting: Radiation Oncology

## 2015-04-29 ENCOUNTER — Ambulatory Visit: Payer: Medicare Other

## 2015-04-29 ENCOUNTER — Other Ambulatory Visit (HOSPITAL_BASED_OUTPATIENT_CLINIC_OR_DEPARTMENT_OTHER): Payer: Medicare Other

## 2015-04-29 ENCOUNTER — Telehealth: Payer: Self-pay | Admitting: Internal Medicine

## 2015-04-29 ENCOUNTER — Ambulatory Visit (HOSPITAL_BASED_OUTPATIENT_CLINIC_OR_DEPARTMENT_OTHER): Payer: Medicare Other | Admitting: Physician Assistant

## 2015-04-29 ENCOUNTER — Ambulatory Visit (HOSPITAL_BASED_OUTPATIENT_CLINIC_OR_DEPARTMENT_OTHER): Payer: Medicare Other

## 2015-04-29 VITALS — BP 117/68 | HR 61 | Temp 98.0°F | Resp 18 | Ht 68.0 in | Wt 203.9 lb

## 2015-04-29 DIAGNOSIS — C3411 Malignant neoplasm of upper lobe, right bronchus or lung: Secondary | ICD-10-CM

## 2015-04-29 DIAGNOSIS — C3491 Malignant neoplasm of unspecified part of right bronchus or lung: Secondary | ICD-10-CM

## 2015-04-29 DIAGNOSIS — Z5111 Encounter for antineoplastic chemotherapy: Secondary | ICD-10-CM | POA: Diagnosis present

## 2015-04-29 DIAGNOSIS — Z510 Encounter for antineoplastic radiation therapy: Secondary | ICD-10-CM | POA: Diagnosis not present

## 2015-04-29 LAB — COMPREHENSIVE METABOLIC PANEL (CC13)
ALBUMIN: 3.4 g/dL — AB (ref 3.5–5.0)
ALK PHOS: 85 U/L (ref 40–150)
ALT: 23 U/L (ref 0–55)
AST: 18 U/L (ref 5–34)
Anion Gap: 9 mEq/L (ref 3–11)
BILIRUBIN TOTAL: 0.33 mg/dL (ref 0.20–1.20)
BUN: 9.8 mg/dL (ref 7.0–26.0)
CO2: 22 mEq/L (ref 22–29)
Calcium: 8.8 mg/dL (ref 8.4–10.4)
Chloride: 108 mEq/L (ref 98–109)
Creatinine: 1 mg/dL (ref 0.7–1.3)
GLUCOSE: 105 mg/dL (ref 70–140)
Potassium: 4.3 mEq/L (ref 3.5–5.1)
SODIUM: 139 meq/L (ref 136–145)
TOTAL PROTEIN: 6.1 g/dL — AB (ref 6.4–8.3)

## 2015-04-29 LAB — CBC WITH DIFFERENTIAL/PLATELET
BASO%: 0.6 % (ref 0.0–2.0)
Basophils Absolute: 0 10*3/uL (ref 0.0–0.1)
EOS%: 1.6 % (ref 0.0–7.0)
Eosinophils Absolute: 0 10*3/uL (ref 0.0–0.5)
HCT: 40.7 % (ref 38.4–49.9)
HEMOGLOBIN: 13.3 g/dL (ref 13.0–17.1)
LYMPH%: 12.6 % — ABNORMAL LOW (ref 14.0–49.0)
MCH: 26.6 pg — ABNORMAL LOW (ref 27.2–33.4)
MCHC: 32.7 g/dL (ref 32.0–36.0)
MCV: 81.3 fL (ref 79.3–98.0)
MONO#: 0.2 10*3/uL (ref 0.1–0.9)
MONO%: 6.8 % (ref 0.0–14.0)
NEUT%: 78.4 % — ABNORMAL HIGH (ref 39.0–75.0)
NEUTROS ABS: 1.9 10*3/uL (ref 1.5–6.5)
PLATELETS: 115 10*3/uL — AB (ref 140–400)
RBC: 5.01 10*6/uL (ref 4.20–5.82)
RDW: 16.6 % — AB (ref 11.0–14.6)
WBC: 2.4 10*3/uL — AB (ref 4.0–10.3)
lymph#: 0.3 10*3/uL — ABNORMAL LOW (ref 0.9–3.3)

## 2015-04-29 MED ORDER — FAMOTIDINE IN NACL 20-0.9 MG/50ML-% IV SOLN
20.0000 mg | Freq: Once | INTRAVENOUS | Status: AC
  Administered 2015-04-29: 20 mg via INTRAVENOUS

## 2015-04-29 MED ORDER — DIPHENHYDRAMINE HCL 50 MG/ML IJ SOLN
INTRAMUSCULAR | Status: AC
  Filled 2015-04-29: qty 1

## 2015-04-29 MED ORDER — DEXTROSE 5 % IV SOLN
45.0000 mg/m2 | Freq: Once | INTRAVENOUS | Status: AC
  Administered 2015-04-29: 96 mg via INTRAVENOUS
  Filled 2015-04-29: qty 16

## 2015-04-29 MED ORDER — DIPHENHYDRAMINE HCL 50 MG/ML IJ SOLN
25.0000 mg | Freq: Once | INTRAMUSCULAR | Status: AC
  Administered 2015-04-29: 25 mg via INTRAVENOUS

## 2015-04-29 MED ORDER — FAMOTIDINE IN NACL 20-0.9 MG/50ML-% IV SOLN
INTRAVENOUS | Status: AC
  Filled 2015-04-29: qty 50

## 2015-04-29 MED ORDER — CARBOPLATIN CHEMO INJECTION 450 MG/45ML
218.0000 mg | Freq: Once | INTRAVENOUS | Status: AC
  Administered 2015-04-29: 220 mg via INTRAVENOUS
  Filled 2015-04-29: qty 22

## 2015-04-29 MED ORDER — SODIUM CHLORIDE 0.9 % IV SOLN
Freq: Once | INTRAVENOUS | Status: AC
  Administered 2015-04-29: 10:00:00 via INTRAVENOUS

## 2015-04-29 MED ORDER — SODIUM CHLORIDE 0.9 % IV SOLN
Freq: Once | INTRAVENOUS | Status: AC
  Administered 2015-04-29: 11:00:00 via INTRAVENOUS
  Filled 2015-04-29: qty 8

## 2015-04-29 NOTE — Telephone Encounter (Signed)
Pt confirmed labs/ov per 08/22 POF, gave pt avs and calendar.... KJ

## 2015-04-29 NOTE — Patient Instructions (Addendum)
[facility] Discharge Instructions for Patients Receiving Chemotherapy  Today you received the following chemotherapy agents: Taxol and Carboplatin.  To help prevent nausea and vomiting after your treatment, we encourage you to take your nausea medication: Compazine. Take one every 6 hours as needed.   If you develop nausea and vomiting that is not controlled by your nausea medication, call the clinic.   BELOW ARE SYMPTOMS THAT SHOULD BE REPORTED IMMEDIATELY:  *FEVER GREATER THAN 100.5 F  *CHILLS WITH OR WITHOUT FEVER  NAUSEA AND VOMITING THAT IS NOT CONTROLLED WITH YOUR NAUSEA MEDICATION  *UNUSUAL SHORTNESS OF BREATH  *UNUSUAL BRUISING OR BLEEDING  TENDERNESS IN MOUTH AND THROAT WITH OR WITHOUT PRESENCE OF ULCERS  *URINARY PROBLEMS  *BOWEL PROBLEMS  UNUSUAL RASH Items with * indicate a potential emergency and should be followed up as soon as possible.  Feel free to call the clinic should you have any questions or concerns. The clinic phone number is [phone].  Please show the [facility] at check-in to the Emergency Department and triage nurse.  Carboplatin injection What is this medicine? CARBOPLATIN (KAR boe pla tin) is a chemotherapy drug. It targets fast dividing cells, like cancer cells, and causes these cells to die. This medicine is used to treat ovarian cancer and many other cancers. This medicine may be used for other purposes; ask your health care provider or pharmacist if you have questions. COMMON BRAND NAME(S): Paraplatin What should I tell my health care provider before I take this medicine? They need to know if you have any of these conditions: -blood disorders -hearing problems -kidney disease -recent or ongoing radiation therapy -an unusual or allergic reaction to carboplatin, cisplatin, other chemotherapy, other medicines, foods, dyes, or preservatives -pregnant or trying to get pregnant -breast-feeding How should I use this  medicine? This drug is usually given as an infusion into a vein. It is administered in a hospital or clinic by a specially trained health care professional. Talk to your pediatrician regarding the use of this medicine in children. Special care may be needed. Overdosage: If you think you have taken too much of this medicine contact a poison control center or emergency room at once. NOTE: This medicine is only for you. Do not share this medicine with others. What if I miss a dose? It is important not to miss a dose. Call your doctor or health care professional if you are unable to keep an appointment. What may interact with this medicine? -medicines for seizures -medicines to increase blood counts like filgrastim, pegfilgrastim, sargramostim -some antibiotics like amikacin, gentamicin, neomycin, streptomycin, tobramycin -vaccines Talk to your doctor or health care professional before taking any of these medicines: -acetaminophen -aspirin -ibuprofen -ketoprofen -naproxen This list may not describe all possible interactions. Give your health care provider a list of all the medicines, herbs, non-prescription drugs, or dietary supplements you use. Also tell them if you smoke, drink alcohol, or use illegal drugs. Some items may interact with your medicine. What should I watch for while using this medicine? Your condition will be monitored carefully while you are receiving this medicine. You will need important blood work done while you are taking this medicine. This drug may make you feel generally unwell. This is not uncommon, as chemotherapy can affect healthy cells as well as cancer cells. Report any side effects. Continue your course of treatment even though you feel ill unless your doctor tells you to stop. In some cases, you may be given additional  medicines to help with side effects. Follow all directions for their use. Call your doctor or health care professional for advice if you get a  fever, chills or sore throat, or other symptoms of a cold or flu. Do not treat yourself. This drug decreases your body's ability to fight infections. Try to avoid being around people who are sick. This medicine may increase your risk to bruise or bleed. Call your doctor or health care professional if you notice any unusual bleeding. Be careful brushing and flossing your teeth or using a toothpick because you may get an infection or bleed more easily. If you have any dental work done, tell your dentist you are receiving this medicine. Avoid taking products that contain aspirin, acetaminophen, ibuprofen, naproxen, or ketoprofen unless instructed by your doctor. These medicines may hide a fever. Do not become pregnant while taking this medicine. Women should inform their doctor if they wish to become pregnant or think they might be pregnant. There is a potential for serious side effects to an unborn child. Talk to your health care professional or pharmacist for more information. Do not breast-feed an infant while taking this medicine. What side effects may I notice from receiving this medicine? Side effects that you should report to your doctor or health care professional as soon as possible: -allergic reactions like skin rash, itching or hives, swelling of the face, lips, or tongue -signs of infection - fever or chills, cough, sore throat, pain or difficulty passing urine -signs of decreased platelets or bleeding - bruising, pinpoint red spots on the skin, black, tarry stools, nosebleeds -signs of decreased red blood cells - unusually weak or tired, fainting spells, lightheadedness -breathing problems -changes in hearing -changes in vision -chest pain -high blood pressure -low blood counts - This drug may decrease the number of white blood cells, red blood cells and platelets. You may be at increased risk for infections and bleeding. -nausea and vomiting -pain, swelling, redness or irritation at the  injection site -pain, tingling, numbness in the hands or feet -problems with balance, talking, walking -trouble passing urine or change in the amount of urine Side effects that usually do not require medical attention (report to your doctor or health care professional if they continue or are bothersome): -hair loss -loss of appetite -metallic taste in the mouth or changes in taste This list may not describe all possible side effects. Call your doctor for medical advice about side effects. You may report side effects to FDA at 1-800-FDA-1088. Where should I keep my medicine? This drug is given in a hospital or clinic and will not be stored at home. NOTE: This sheet is a summary. It may not cover all possible information. If you have questions about this medicine, talk to your doctor, pharmacist, or health care provider.  2015, Elsevier/Gold Standard. (2007-11-29 14:38:05)

## 2015-04-29 NOTE — Progress Notes (Signed)
[facility] [phone]   [phone]  OFFICE PROGRESS NOTE  [name], [place] [address]  DIAGNOSIS: Stage IIIA (T3, N2, M0) non-small cell lung cancer, squamous cell carcinoma diagnosed in June 2016.  PRIOR THERAPY: None  CURRENT THERAPY: Concurrent chemoradiation with weekly carboplatin for an AUC of 2 and paclitaxel 45 mg/m2.   INTERVAL HISTORY: Colton Nguyen 69 y.o. male returns to the clinic today for follow-up visit. The patient is tolerating his current course of concurrent chemoradiation fairly well with no significant adverse effects. He does report some fatigue as well as some constipation that is self-limiting and not problematic. He denied having any significant chest pain, shortness of breath, cough or hemoptysis. He denied having any dysphagia or odynophagia. He has no nausea or vomiting. He has no fever or chills. No significant weight loss or night sweats. He feels his breathing has improved. He is here today to start cycle #7 of his treatment.   MEDICAL HISTORY: Past Medical History  Diagnosis Date  . CHF (congestive heart failure)   . Hypertension   . Hyperlipidemia   . BPH (benign prostatic hyperplasia)   . CAD (coronary artery disease)   . Chronic cardiopulmonary disease   . Nicotine dependence   . Mass of lung   . COPD (chronic obstructive pulmonary disease)   . Shortness of breath dyspnea     with exertion  . Anxiety     situational  . Personal history of kidney stones   . Restless leg     ALLERGIES:  has No Known Allergies.  MEDICATIONS:  Current Outpatient Prescriptions  Medication Sig Dispense Refill  . amLODipine (NORVASC) 10 MG tablet Take 10 mg by mouth daily.     Marland Kitchen aspirin 81 MG tablet Take 81 mg by mouth daily.    Marland Kitchen atorvastatin (LIPITOR) 40 MG tablet Take 40 mg by mouth daily.     Marland Kitchen BYSTOLIC 20 MG TABS Take 20 mg by mouth daily.     . cetirizine (ZYRTEC) 5 MG  chewable tablet Chew 5 mg by mouth daily.    . CHANTIX STARTING MONTH PAK 0.5 MG X 11 & 1 MG X 42 tablet Take 0.5-1 mg by mouth as directed.     . Cholecalciferol (VITAMIN D3) 5000 UNITS TABS Take 5,000 Units by mouth daily.    . finasteride (PROSCAR) 5 MG tablet Take 5 mg by mouth daily.     . fluticasone (FLONASE) 50 MCG/ACT nasal spray     . losartan (COZAAR) 100 MG tablet Take 100 mg by mouth daily.     Marland Kitchen PROAIR HFA 108 (90 BASE) MCG/ACT inhaler Inhale 2 puffs into the lungs every 4 (four) hours as needed for wheezing or shortness of breath.     . prochlorperazine (COMPAZINE) 10 MG tablet Take 1 tablet (10 mg total) by mouth every 6 (six) hours as needed for nausea or vomiting. 30 tablet 0  . tamsulosin (FLOMAX) 0.4 MG CAPS capsule Take 0.4 mg by mouth daily.     . Wound Cleansers (RADIAPLEX EX) Apply topically.     No current facility-administered medications for this visit.    SURGICAL HISTORY:  Past Surgical History  Procedure Laterality Date  . Cad ccta 01/31/15    . Right inguinal hernia repair at age 5    . Colonoscopy    . Cervical fusion    . Back surgery    . Video bronchoscopy  with endobronchial ultrasound N/A 02/27/2015    Procedure: VIDEO BRONCHOSCOPY WITH ENDOBRONCHIAL ULTRASOUND;  Surgeon: Grace Isaac, MD;  Location: [facility];  Service: Thoracic;  Laterality: N/A;  . Lung biopsy N/A 02/27/2015    Procedure: LUNG BIOPSY;  Surgeon: Grace Isaac, MD;  Location: [facility];  Service: Thoracic;  Laterality: N/A;    REVIEW OF SYSTEMS:  A comprehensive review of systems was negative.   PHYSICAL EXAMINATION: General appearance: alert, cooperative and no distress Head: Normocephalic, without obvious abnormality, atraumatic Neck: no adenopathy, no JVD, supple, symmetrical, trachea midline and thyroid not enlarged, symmetric, no tenderness/mass/nodules Lymph nodes: Cervical, supraclavicular, and axillary nodes normal. Resp: clear to auscultation bilaterally Back: symmetric,  no curvature. ROM normal. No CVA tenderness. Cardio: regular rate and rhythm, S1, S2 normal, no murmur, click, rub or gallop GI: soft, non-tender; bowel sounds normal; no masses,  no organomegaly Extremities: extremities normal, atraumatic, no cyanosis or edema  ECOG PERFORMANCE STATUS: 0 - Asymptomatic  Blood pressure 117/68, pulse 61, temperature 98 F (36.7 C), temperature source Oral, resp. rate 18, height '5\' 8"'$  (1.727 m), weight 203 lb 14.4 oz (92.488 kg), SpO2 99 %.  LABORATORY DATA: Lab Results  Component Value Date   WBC 2.4* 04/29/2015   HGB 13.3 04/29/2015   HCT 40.7 04/29/2015   MCV 81.3 04/29/2015   PLT 115* 04/29/2015      Chemistry      Component Value Date/Time   NA 139 04/29/2015 0827   NA 136 02/27/2015 0701   K 4.3 04/29/2015 0827   K 4.3 02/27/2015 0701   CL 102 02/27/2015 0701   CO2 22 04/29/2015 0827   CO2 22 02/27/2015 0701   BUN 9.8 04/29/2015 0827   BUN 11 02/27/2015 0701   CREATININE 1.0 04/29/2015 0827   CREATININE 1.07 02/27/2015 0701      Component Value Date/Time   CALCIUM 8.8 04/29/2015 0827   CALCIUM 8.7* 02/27/2015 0701   ALKPHOS 85 04/29/2015 0827   ALKPHOS 103 02/27/2015 0701   AST 18 04/29/2015 0827   AST 28 02/27/2015 0701   ALT 23 04/29/2015 0827   ALT 17 02/27/2015 0701   BILITOT 0.33 04/29/2015 0827   BILITOT 0.7 02/27/2015 0701       RADIOGRAPHIC STUDIES: No results found.  ASSESSMENT AND PLAN: This is a very pleasant 69 years old African-American male with a stage IIIa non-small cell lung cancer currently undergoing concurrent chemoradiation with weekly carboplatin and paclitaxel and tolerating his treatment fairly well. Patient was discussed with and also seen by Dr. Julien Nordmann. He will proceed with week 7 of his treatment with concurrent chemoradiation. He will complete his course of concurrent chemoradiation as scheduled and follow-up in one month for another symptom management visit with a restaging CT scan of his chest  with contrast to reevaluate his disease..  Patient to continue on Chantix and his efforts to quit smoking.  He was advised to call immediately if he has any concerning symptoms in the interval. The patient voices understanding of current disease status and treatment options and is in agreement with the current care plan.  All questions were answered. The patient knows to call the clinic with any problems, questions or concerns. We can certainly see the patient much sooner if necessary.  Carlton Adam, PA-C 04/29/2015  ADDENDUM: Hematology/Oncology Attending: I had a face to face encounter with the patient. I recommended his care plan. This is a very pleasant 69 years old African-American male with a  stage IIIa non-small cell lung cancer, squamous cell carcinoma who is currently undergoing a course of concurrent chemoradiation with weekly carboplatin and paclitaxel. He is tolerating his treatment fairly well with no significant adverse effects. The patient denied having any fever or chills, no nausea or vomiting. He denied having any dysphagia or odynophagia. I recommended for the patient to proceed with the last dose of chemotherapy today as scheduled. He would come back for follow-up visit in one month's for reevaluation after repeating CT scan of the chest for restaging of his disease. The patient was advised to call immediately if he has any concerning symptoms in the interval.  Disclaimer: This note was dictated with voice recognition software. Similar sounding words can inadvertently be transcribed and may be missed upon review. Eilleen Kempf., MD 04/29/2015

## 2015-04-29 NOTE — Patient Instructions (Signed)
Complete your course of concurrent chemoradiation as scheduled Follow-up in one month with a restaging CT scan of your chest to reevaluate your disease

## 2015-04-30 ENCOUNTER — Ambulatory Visit: Payer: Medicare Other

## 2015-04-30 ENCOUNTER — Ambulatory Visit
Admission: RE | Admit: 2015-04-30 | Discharge: 2015-04-30 | Disposition: A | Discharge status: Home / Self Care | Payer: Medicare Other | Source: Ambulatory Visit | Attending: Radiation Oncology | Admitting: Radiation Oncology

## 2015-04-30 DIAGNOSIS — R531 Weakness: Secondary | ICD-10-CM

## 2015-04-30 DIAGNOSIS — R0600 Dyspnea, unspecified: Secondary | ICD-10-CM

## 2015-04-30 DIAGNOSIS — Z510 Encounter for antineoplastic radiation therapy: Secondary | ICD-10-CM | POA: Diagnosis not present

## 2015-04-30 DIAGNOSIS — R262 Difficulty in walking, not elsewhere classified: Secondary | ICD-10-CM

## 2015-04-30 DIAGNOSIS — R29898 Other symptoms and signs involving the musculoskeletal system: Secondary | ICD-10-CM

## 2015-04-30 DIAGNOSIS — Z9181 History of falling: Secondary | ICD-10-CM

## 2015-04-30 NOTE — Therapy (Signed)
[name], [place], [address] Phone: [phone]   Fax:  [phone]  Physical Therapy Treatment  Patient Details  Name: Colton Nguyen MRN: [id number] Date of Birth: Nov 20, 1945 Referring Provider:  Curt Bears, MD  Encounter Date: 04/30/2015      PT End of Session - 04/30/15 0930    Visit Number 6   Number of Visits 18   Date for PT Re-Evaluation 05/30/15   PT Start Time 0848   PT Stop Time 0923   PT Time Calculation (min) 35 min   Activity Tolerance Patient tolerated treatment well   Behavior During Therapy [facility] for tasks assessed/performed      Past Medical History  Diagnosis Date  . CHF (congestive heart failure)   . Hypertension   . Hyperlipidemia   . BPH (benign prostatic hyperplasia)   . CAD (coronary artery disease)   . Chronic cardiopulmonary disease   . Nicotine dependence   . Mass of lung   . COPD (chronic obstructive pulmonary disease)   . Shortness of breath dyspnea     with exertion  . Anxiety     situational  . Personal history of kidney stones   . Restless leg     Past Surgical History  Procedure Laterality Date  . Cad ccta 01/31/15    . Right inguinal hernia repair at age 2    . Colonoscopy    . Cervical fusion    . Back surgery    . Video bronchoscopy with endobronchial ultrasound N/A 02/27/2015    Procedure: VIDEO BRONCHOSCOPY WITH ENDOBRONCHIAL ULTRASOUND;  Surgeon: Grace Isaac, MD;  Location: [facility];  Service: Thoracic;  Laterality: N/A;  . Lung biopsy N/A 02/27/2015    Procedure: LUNG BIOPSY;  Surgeon: Grace Isaac, MD;  Location: [facility];  Service: Thoracic;  Laterality: N/A;    There were no vitals filed for this visit.  Visit Diagnosis:  Generalized weakness  Dyspnea  Difficulty walking  Personal history of fall  Weakness of right lower extremity      Subjective Assessment - 04/30/15 0856    Subjective Doing good, no complaints.  Have to leave a few mins early (~0920) for my next appt today. Really want to start focusing more on balance as I feel my walking is getting better.   Currently in Pain? No/denies                         [facility] Adult PT Treatment/Exercise - 04/30/15 0001    Knee/Hip Exercises: Aerobic   Nustep Level 4 x 5 minutes   Knee/Hip Exercises: Machines for Strengthening   Cybex Leg Press 60 lbs, 21 reps   Knee/Hip Exercises: Standing   Other Standing Knee Exercises In // bars: Standing on Blue FitterBoard for marching 2 sets of 30 sec, stand with narrow BOS and eyes closed 30 sec. Then gait in bars 2 sets each of following: Heel-toe front and then backwards, crossover in front to Rt and then Lt, toe walking and heel walking. SLS with front-back and then side-side leg swings for 1 set of 30 sec each leg. Finished with heel/toe raises 15 reps with cuing for no hip flexion-extension during. Standing rest breaks prn throughout (about 3 today due to fatigue)   Knee/Hip Exercises: Seated   Hamstring Curl Strengthening;Both;2 sets;10 reps  Green theraband   Sit to [employer] 2 sets;5 reps;without UE support   Knee/Hip Exercises: Supine  Bridges Strengthening;Both;10 reps   Other Supine Knee/Hip Exercises Bil S/L for hip abduction 10 reps each with cuing for technique                   Short Term Clinic Goals - 04/23/15 0933    CC Short Term Goal  #1   Title Patient will be able to tolerate his initial home exercise program to increase leg strength   Status Partially Met   CC Short Term Goal  #2   Title Patient will be able to improve his 6 minute walk test to report BORG Dyspnea score of </=2 (mild shortness of breath)   Status On-going   CC Short Term Goal  #3   Title Patient will be able to improve his 6 minute walk test to report BORG ratings of perceived exertion scale score of </=11 (fairly light)   CC Short Term Goal  #4   Title Patient will be able to report a >/= 25%  improvement in his ability to tolerate household and daily tasks without feeling fatigued or needing to rest.   Status On-going            Lung Clinic Goals - 03/07/15 1527    Patient will be able to verbalize understanding of the benefit of exercise to decrease fatigue.   Status Achieved   Patient will be able to verbalize the importance of posture.   Status Achieved   Patient will be able to demonstrate diaphragmatic breathing for improved lung function.   Status Achieved   Patient will be able to verbalize understanding of the role of physical therapy to prevent functional decline and who to contact if physical therapy is needed.   Status Achieved         Long Term Clinic Goals - 04/04/15 1219    CC Long Term Goal  #1   Title Patient will be able to perform his final home exercise program to increase leg strength and verbalize understanding of safe self-progression   Time 8   Period Weeks   Status New   CC Long Term Goal  #2   Title Patient will be able to improve his 6 minute walk test to report BORG Dyspnea score of </=1 (very mild shortness of breath)   Time 8   Period Weeks   Status New   CC Long Term Goal  #3   Title Patient will be able to improve his 6 minute walk test to report BORG ratings of perceived exertion scale score of </=9 (veryy light)   Time 8   Period Weeks   Status New   CC Long Term Goal  #4   Title Patient will be able to report a >/= 50% improvement in his ability to tolerate household and daily tasks without feeling fatigued or needing to rest.   Time 8   Period Weeks   Status New   CC Long Term Goal  #5   Title Patient will be able to increase bilateral leg strength to >/= 4/5 for increased ease with ambulation and squatting   Time 8   Period Weeks   Status New            Plan - 04/30/15 0931    Clinical Impression Statement Pt wants to focus more on balance now as this is his primary complaint. He tolerated new balance activities  well today with some LE fatigue throughout. Instructed pt that we will still incorporate LE strength as this  is still a problem for him, but will also be doing more balance as well now and pt agreed with this.    Pt will benefit from skilled therapeutic intervention in order to improve on the following deficits Decreased strength;Difficulty walking;Decreased activity tolerance;Decreased endurance   Rehab Potential Excellent   Clinical Impairments Affecting Rehab Potential Current chemoradiation treatment   PT Frequency 2x / week   PT Duration 8 weeks   PT Treatment/Interventions Patient/family education;Energy conservation;Therapeutic exercise;Functional mobility training;ADLs/Self Care Home Management   PT Next Visit Plan Cont progressing proprioceptive activies>LE strength as tolerated.    Consulted and Agree with Plan of Care Patient        Problem List Patient Active Problem List   Diagnosis Date Noted  . Smoking trying to quit 04/01/2015  . Squamous cell carcinoma of lung, stage III 03/07/2015  . spiculated right upper lobe mass 02/14/2015    Otelia Limes, PTA 04/30/2015, 10:26 AM  [facility] [place], [place], [address] Phone: [phone]   Fax:  [phone]

## 2015-05-01 ENCOUNTER — Ambulatory Visit
Admission: RE | Admit: 2015-05-01 | Discharge: 2015-05-01 | Disposition: A | Discharge status: Home / Self Care | Payer: Medicare Other | Source: Ambulatory Visit | Attending: Radiation Oncology | Admitting: Radiation Oncology

## 2015-05-01 ENCOUNTER — Encounter: Payer: Self-pay | Admitting: Radiation Oncology

## 2015-05-01 VITALS — BP 117/72 | HR 64 | Resp 16 | Wt 206.8 lb

## 2015-05-01 DIAGNOSIS — Z510 Encounter for antineoplastic radiation therapy: Secondary | ICD-10-CM | POA: Diagnosis not present

## 2015-05-01 DIAGNOSIS — C3411 Malignant neoplasm of upper lobe, right bronchus or lung: Secondary | ICD-10-CM

## 2015-05-01 MED ORDER — RADIAPLEXRX EX GEL
Freq: Once | CUTANEOUS | Status: AC
  Administered 2015-05-01: 10:00:00 via TOPICAL

## 2015-05-01 NOTE — Progress Notes (Signed)
Department of Radiation Oncology  Phone:  [phone] Fax:        [phone]  Weekly Treatment Note    Name: Colton Nguyen Date: 05/01/2015 MRN: [id number] DOB: [birth year]   Current dose: 66 Gy  Current fraction:33   MEDICATIONS: Current Outpatient Prescriptions  Medication Sig Dispense Refill  . amLODipine (NORVASC) 10 MG tablet Take 10 mg by mouth daily.     Marland Kitchen aspirin 81 MG tablet Take 81 mg by mouth daily.    Marland Kitchen atorvastatin (LIPITOR) 40 MG tablet Take 40 mg by mouth daily.     Marland Kitchen BYSTOLIC 20 MG TABS Take 20 mg by mouth daily.     . cetirizine (ZYRTEC) 5 MG chewable tablet Chew 5 mg by mouth daily.    . CHANTIX STARTING MONTH PAK 0.5 MG X 11 & 1 MG X 42 tablet Take 0.5-1 mg by mouth as directed.     . Cholecalciferol (VITAMIN D3) 5000 UNITS TABS Take 5,000 Units by mouth daily.    . finasteride (PROSCAR) 5 MG tablet Take 5 mg by mouth daily.     . fluticasone (FLONASE) 50 MCG/ACT nasal spray     . losartan (COZAAR) 100 MG tablet Take 100 mg by mouth daily.     Marland Kitchen PROAIR HFA 108 (90 BASE) MCG/ACT inhaler Inhale 2 puffs into the lungs every 4 (four) hours as needed for wheezing or shortness of breath.     . prochlorperazine (COMPAZINE) 10 MG tablet Take 1 tablet (10 mg total) by mouth every 6 (six) hours as needed for nausea or vomiting. 30 tablet 0  . tamsulosin (FLOMAX) 0.4 MG CAPS capsule Take 0.4 mg by mouth daily.     . Wound Cleansers (RADIAPLEX EX) Apply topically.     No current facility-administered medications for this encounter.     ALLERGIES: Review of patient's allergies indicates no known allergies.   LABORATORY DATA:  Lab Results  Component Value Date   WBC 2.4* 04/29/2015   HGB 13.3 04/29/2015   HCT 40.7 04/29/2015   MCV 81.3 04/29/2015   PLT 115* 04/29/2015   Lab Results  Component Value Date   NA 139 04/29/2015   K 4.3 04/29/2015   CL 102 02/27/2015   CO2 22 04/29/2015   Lab Results  Component Value Date   ALT 23 04/29/2015   AST 18 04/29/2015   ALKPHOS 85 04/29/2015   BILITOT 0.33 04/29/2015     NARRATIVE: Colton Nguyen was seen today for weekly treatment management. The chart was checked and the patient's films were reviewed.  Weight and vitals stable. Denies pain. Reports his cough is mostly dry. Reports rarely when his cough is productive its a small amount of clear sputum. Denies hemoptysis. Reports a raspy throat. Denies pain or difficulty associated with swallowing. Reports fatigue. Denies shortness of breath. Hyperpigmentation without desquamation within treatment field. Reports using radiaplex bid as directed and an additional tube of radiaplex was given today. Patient understands to continue radiaplex bid for the next two weeks. One month follow up appointment card given. Patient understands to contact this RN with future needs.   BP 117/72 mmHg  Pulse 64  Resp 16  Wt 206 lb 12.8 oz (93.804 kg) Wt Readings from Last 3 Encounters:  05/01/15 206 lb 12.8 oz (93.804 kg)  04/29/15 203 lb 14.4 oz (92.488 kg)  04/26/15 205 lb 3.2 oz (93.078 kg)     PHYSICAL EXAMINATION: weight is 206 lb 12.8 oz (93.804 kg). His blood  pressure is 117/72 and his pulse is 64. His respiration is 16.      Mild hyperpigmentation present on chest  ASSESSMENT: The patient is doing satisfactorily with treatment.  PLAN: We will continue with the patient's radiation treatment as planned.

## 2015-05-01 NOTE — Addendum Note (Signed)
Encounter addended by: Heywood Footman, RN on: 05/01/2015 10:16 AM<BR>     Documentation filed: [employer], Inpatient MAR, Orders

## 2015-05-01 NOTE — Progress Notes (Addendum)
Weight and vitals stable. Denies pain. Reports his cough is mostly dry. Reports rarely when his cough is productive its a small amount of clear sputum. Denies hemoptysis. Reports a raspy throat. Denies pain or difficulty associated with swallowing. Reports fatigue. Denies shortness of breath. Hyperpigmentation without desquamation within treatment field. Reports using radiaplex bid as directed and an additional tube of radiaplex was given today. Patient understands to continue radiaplex bid for the next two weeks. One month follow up appointment card given. Patient understands to contact this RN with future needs.   BP 117/72 mmHg  Pulse 64  Resp 16  Wt 206 lb 12.8 oz (93.804 kg) Wt Readings from Last 3 Encounters:  05/01/15 206 lb 12.8 oz (93.804 kg)  04/29/15 203 lb 14.4 oz (92.488 kg)  04/26/15 205 lb 3.2 oz (93.078 kg)

## 2015-05-02 ENCOUNTER — Ambulatory Visit: Payer: Medicare Other

## 2015-05-02 DIAGNOSIS — R531 Weakness: Secondary | ICD-10-CM | POA: Diagnosis not present

## 2015-05-02 DIAGNOSIS — R262 Difficulty in walking, not elsewhere classified: Secondary | ICD-10-CM

## 2015-05-02 DIAGNOSIS — R29898 Other symptoms and signs involving the musculoskeletal system: Secondary | ICD-10-CM

## 2015-05-02 DIAGNOSIS — R0600 Dyspnea, unspecified: Secondary | ICD-10-CM

## 2015-05-02 DIAGNOSIS — Z9181 History of falling: Secondary | ICD-10-CM

## 2015-05-02 NOTE — Therapy (Signed)
[place], [place], [address] Phone: [phone]   Fax:  [phone]  Physical Therapy Treatment  Patient Details  Name: Colton Nguyen MRN: [id number] Date of Birth: 12-Feb-1946 Referring Provider:  Curt Bears, MD  Encounter Date: 05/02/2015      PT End of Session - 05/02/15 0931    Visit Number 7   Number of Visits 18   Date for PT Re-Evaluation 05/30/15   PT Start Time 0845   PT Stop Time 0927   PT Time Calculation (min) 42 min   Activity Tolerance Patient tolerated treatment well   Behavior During Therapy [facility] for tasks assessed/performed      Past Medical History  Diagnosis Date  . CHF (congestive heart failure)   . Hypertension   . Hyperlipidemia   . BPH (benign prostatic hyperplasia)   . CAD (coronary artery disease)   . Chronic cardiopulmonary disease   . Nicotine dependence   . Mass of lung   . COPD (chronic obstructive pulmonary disease)   . Shortness of breath dyspnea     with exertion  . Anxiety     situational  . Personal history of kidney stones   . Restless leg     Past Surgical History  Procedure Laterality Date  . Cad ccta 01/31/15    . Right inguinal hernia repair at age 69    . Colonoscopy    . Cervical fusion    . Back surgery    . Video bronchoscopy with endobronchial ultrasound N/A 02/27/2015    Procedure: VIDEO BRONCHOSCOPY WITH ENDOBRONCHIAL ULTRASOUND;  Surgeon: Grace Isaac, MD;  Location: [place];  Service: Thoracic;  Laterality: N/A;  . Lung biopsy N/A 02/27/2015    Procedure: LUNG BIOPSY;  Surgeon: Grace Isaac, MD;  Location: [place];  Service: Thoracic;  Laterality: N/A;    There were no vitals filed for this visit.  Visit Diagnosis:  Generalized weakness  Dyspnea  Difficulty walking  Personal history of fall  Weakness of right lower extremity      Subjective Assessment - 05/02/15 0848    Subjective Finished my radiation  treatments yesterday! Look forward to getting my strength back now that that's over.   Currently in Pain? No/denies                         [facility] Adult PT Treatment/Exercise - 05/02/15 0001    Knee/Hip Exercises: Aerobic   Nustep Level 5 x5 minutes   Knee/Hip Exercises: Machines for Strengthening   Cybex Leg Press 60 lbs, 25 reps   Knee/Hip Exercises: Standing   Walking with Sports Cord 3 plates for all 4 directions 3 reps each with SBA-CGA throughout, used towel at belt for comfort Lt sidestep most challenging.   Other Standing Knee Exercises In //bars: Heel-toe walking front and then back 4 sets each with fingertip support-2 HHA throughout, crossover walking Rt and then Lt 2 sets each. Standing on blue Fitter Board for marching 30 sec, bil SLS 2 sets of 30 sec each LE, then heel-toe stance with eyes open, then eyes closed 2 sets, 30 sec of each with each LE, heel walking 4 sets, toe walking 2 sets.                PT Education - 05/02/15 0930    Education provided Yes   Education Details Pt to include some of the balance activities done here at  home (like SLS and heel-walking at counter)   Person(s) Educated Patient   Methods Explanation;Demonstration   Comprehension Verbalized understanding;Returned demonstration           Short Term Clinic Goals - 05/02/15 [phone]    CC Short Term Goal  #1   Title Patient will be able to tolerate his initial home exercise program to increase leg strength   Status Partially Met   CC Short Term Goal  #2   Title Patient will be able to improve his 6 minute walk test to report BORG Dyspnea score of </=2 (mild shortness of breath)   Status On-going   CC Short Term Goal  #3   Title Patient will be able to improve his 6 minute walk test to report BORG ratings of perceived exertion scale score of </=11 (fairly light)   Status On-going   CC Short Term Goal  #4   Title Patient will be able to report a >/= 25% improvement in his  ability to tolerate household and daily tasks without feeling fatigued or needing to rest.   Status On-going            Lung Clinic Goals - 03/07/15 1527    Patient will be able to verbalize understanding of the benefit of exercise to decrease fatigue.   Status Achieved   Patient will be able to verbalize the importance of posture.   Status Achieved   Patient will be able to demonstrate diaphragmatic breathing for improved lung function.   Status Achieved   Patient will be able to verbalize understanding of the role of physical therapy to prevent functional decline and who to contact if physical therapy is needed.   Status Achieved         Long Term Clinic Goals - 04/04/15 1219    CC Long Term Goal  #1   Title Patient will be able to perform his final home exercise program to increase leg strength and verbalize understanding of safe self-progression   Time 8   Period Weeks   Status New   CC Long Term Goal  #2   Title Patient will be able to improve his 6 minute walk test to report BORG Dyspnea score of </=1 (very mild shortness of breath)   Time 8   Period Weeks   Status New   CC Long Term Goal  #3   Title Patient will be able to improve his 6 minute walk test to report BORG ratings of perceived exertion scale score of </=9 (veryy light)   Time 8   Period Weeks   Status New   CC Long Term Goal  #4   Title Patient will be able to report a >/= 50% improvement in his ability to tolerate household and daily tasks without feeling fatigued or needing to rest.   Time 8   Period Weeks   Status New   CC Long Term Goal  #5   Title Patient will be able to increase bilateral leg strength to >/= 4/5 for increased ease with ambulation and squatting   Time 8   Period Weeks   Status New            Plan - 05/02/15 0931    Clinical Impression Statement Pt is doing very well with balance activities. His Rt LE was weaker upon arrival today increasing his uneven gait, but after  treatment it was less pronounced and pt reported his LE felt better. He is progressing well overall and  is pleased with how he is progressing.    Pt will benefit from skilled therapeutic intervention in order to improve on the following deficits Decreased strength;Difficulty walking;Decreased activity tolerance;Decreased endurance   Rehab Potential Excellent   Clinical Impairments Affecting Rehab Potential Current chemoradiation treatment   PT Frequency 2x / week   PT Duration 8 weeks   PT Treatment/Interventions Patient/family education;Energy conservation;Therapeutic exercise;Functional mobility training;ADLs/Self Care Home Management   PT Next Visit Plan Cont progressing proprioceptive activies>LE strength as tolerated. Assess next visit.    Consulted and Agree with Plan of Care Patient        Problem List Patient Active Problem List   Diagnosis Date Noted  . Smoking trying to quit 04/01/2015  . Squamous cell carcinoma of lung, stage III 03/07/2015  . spiculated right upper lobe mass 02/14/2015    Otelia Limes, PTA 05/02/2015, 9:34 AM  [place], [place], [address] Phone: [phone]   Fax:  [phone]

## 2015-05-07 ENCOUNTER — Ambulatory Visit: Payer: Medicare Other

## 2015-05-07 DIAGNOSIS — R29898 Other symptoms and signs involving the musculoskeletal system: Secondary | ICD-10-CM

## 2015-05-07 DIAGNOSIS — Z9181 History of falling: Secondary | ICD-10-CM

## 2015-05-07 DIAGNOSIS — R531 Weakness: Secondary | ICD-10-CM

## 2015-05-07 DIAGNOSIS — R262 Difficulty in walking, not elsewhere classified: Secondary | ICD-10-CM

## 2015-05-07 DIAGNOSIS — R0600 Dyspnea, unspecified: Secondary | ICD-10-CM

## 2015-05-07 NOTE — Therapy (Signed)
[place], [place], [address] Phone: [phone]   Fax:  [phone]  Physical Therapy Treatment  Patient Details  Name: Colton Nguyen MRN: [id number] Date of Birth: [birth year] Referring Provider:  Curt Bears, MD  Encounter Date: 05/07/2015      PT End of Session - 05/07/15 0925    Visit Number 8   Number of Visits 18   Date for PT Re-Evaluation 05/30/15   PT Start Time 9371   PT Stop Time 0927  Pt reports LE fatigue and ready to be done today   PT Time Calculation (min) 40 min   Activity Tolerance Patient tolerated treatment well   Behavior During Therapy [facility] for tasks assessed/performed      Past Medical History  Diagnosis Date  . CHF (congestive heart failure)   . Hypertension   . Hyperlipidemia   . BPH (benign prostatic hyperplasia)   . CAD (coronary artery disease)   . Chronic cardiopulmonary disease   . Nicotine dependence   . Mass of lung   . COPD (chronic obstructive pulmonary disease)   . Shortness of breath dyspnea     with exertion  . Anxiety     situational  . Personal history of kidney stones   . Restless leg     Past Surgical History  Procedure Laterality Date  . Cad ccta 01/31/15    . Right inguinal hernia repair at age 4    . Colonoscopy    . Cervical fusion    . Back surgery    . Video bronchoscopy with endobronchial ultrasound N/A 02/27/2015    Procedure: VIDEO BRONCHOSCOPY WITH ENDOBRONCHIAL ULTRASOUND;  Surgeon: Grace Isaac, MD;  Location: [place];  Service: Thoracic;  Laterality: N/A;  . Lung biopsy N/A 02/27/2015    Procedure: LUNG BIOPSY;  Surgeon: Grace Isaac, MD;  Location: [place];  Service: Thoracic;  Laterality: N/A;    There were no vitals filed for this visit.  Visit Diagnosis:  Generalized weakness  Dyspnea  Difficulty walking  Personal history of fall  Weakness of right lower extremity      Subjective Assessment -  05/07/15 0851    Subjective Doing good, fell like I can pick my Rt leg up now without dragging it when I walk. Can tell my endurance is better and my balance even feels some improved.   Currently in Pain? No/denies                         [facility] Adult PT Treatment/Exercise - 05/07/15 0001    Knee/Hip Exercises: Aerobic   Nustep Level 5 x5 minutes   Knee/Hip Exercises: Machines for Strengthening   Cybex Leg Press 60 lbs, 25 reps   Knee/Hip Exercises: Standing   Heel Raises Both;2 sets;10 reps  Standing on blue oval in // bars   Hip Flexion Stengthening;Both;10 reps  2 lbs added to each ankle with SLS on blue oval   Hip Flexion Limitations In // bars   Hip Abduction Stengthening;Both;10 reps  2 lbs each ankle with SLS on blue oval   Abduction Limitations In // bars   Hip Extension Stengthening;Both;10 reps  2 lbs added to each ankle with SLS on blue oval   Extension Limitations In // bars   Walking with Sports Cord 3 plates for all 4 directions 3 reps each with SBA>CGA today, used towel at belt for comfort Lt sidestep most challenging.  Other Standing Knee Exercises In // bars: High knee slow marching 2 sets and then bil sidestepping 1 set each direction 2 lbs on each ankle for both activities; Heel-toe walking front and then back 4 sets each; grapevine 2 sets each direction with fingertip support thoughout;                    Short Term Clinic Goals - 05/07/15 [phone]    CC Short Term Goal  #1   Title Patient will be able to tolerate his initial home exercise program to increase leg strength   Status Achieved   CC Short Term Goal  #4   Title Patient will be able to report a >/= 25% improvement in his ability to tolerate household and daily tasks without feeling fatigued or needing to rest.  Pt reports 60-65% improved with endurance   Status Achieved            Lung Clinic Goals - 03/07/15 1527    Patient will be able to verbalize understanding of the  benefit of exercise to decrease fatigue.   Status Achieved   Patient will be able to verbalize the importance of posture.   Status Achieved   Patient will be able to demonstrate diaphragmatic breathing for improved lung function.   Status Achieved   Patient will be able to verbalize understanding of the role of physical therapy to prevent functional decline and who to contact if physical therapy is needed.   Status Achieved         Long Term Clinic Goals - 05/07/15 [phone]    CC Long Term Goal  #1   Title Patient will be able to perform his final home exercise program to increase leg strength and verbalize understanding of safe self-progression   Status On-going   CC Long Term Goal  #2   Title Patient will be able to improve his 6 minute walk test to report BORG Dyspnea score of </=1 (very mild shortness of breath)   Status On-going   CC Long Term Goal  #3   Title Patient will be able to improve his 6 minute walk test to report BORG ratings of perceived exertion scale score of </=9 (veryy light)   Status On-going   CC Long Term Goal  #4   Title Patient will be able to report a >/= 50% improvement in his ability to tolerate household and daily tasks without feeling fatigued or needing to rest.  60-65% improvement reported at this time 05/07/15   Status Achieved   CC Long Term Goal  #5   Title Patient will be able to increase bilateral leg strength to >/= 4/5 for increased ease with ambulation and squatting   Status On-going            Plan - 05/07/15 0925    Clinical Impression Statement Pt is making great progress towards goals and can tell good improvements at home with ADLs. Some goals met today, see goal section.   Pt will benefit from skilled therapeutic intervention in order to improve on the following deficits Decreased strength;Difficulty walking;Decreased activity tolerance;Decreased endurance   Rehab Potential Excellent   Clinical Impairments Affecting Rehab Potential  Current chemoradiation treatment   PT Frequency 2x / week   PT Duration 8 weeks   PT Treatment/Interventions Patient/family education;Energy conservation;Therapeutic exercise;Functional mobility training;ADLs/Self Care Home Management   PT Next Visit Plan Cont progressing proprioceptive activies>LE strength as tolerated. Retest 6 min walk test next visit to  assess progress with this,    Consulted and Agree with Plan of Care Patient        Problem List Patient Active Problem List   Diagnosis Date Noted  . Smoking trying to quit 04/01/2015  . Squamous cell carcinoma of lung, stage III 03/07/2015  . spiculated right upper lobe mass 02/14/2015    Otelia Limes, PTA 05/07/2015, 9:30 AM  [place], [place], [address] Phone: [phone]   Fax:  [phone]

## 2015-05-12 NOTE — Addendum Note (Signed)
Encounter addended by: Tyler Pita, MD on: 05/12/2015  2:34 PM<BR>     Documentation filed: Problem List

## 2015-05-14 NOTE — Progress Notes (Signed)
  Radiation Oncology         [phone]) [phone] ________________________________  Name: Colton Nguyen MRN: [id number]  Date: 05/01/2015   DOB: 1946/03/30  End of Treatment Note  Diagnosis:   69 yo man with stage T3 N2 M0 squamous cell carcinoma of the right upper lung - Stage IIIA  Indication for treatment:  Curative, Chemo-Radiotherapy       Radiation treatment dates:    03/18/2015-05/01/2015  Site/dose:   The primary tumor and involved mediastinal adenopathy were treated to 66 Gy in 33 fractions of 2 Gy.  Beams/energy:   A five field 3D conformal treatment arrangement was used delivering 6 and 10 MV photons.  Daily image-guidance CT was used to align the treatment with the targeted volume  Narrative: The patient tolerated radiation treatment relatively well.  The patient experienced some esophagitis characterized as mild.  The patient also noted fatigue.  Plan: The patient has completed radiation treatment. The patient will return to radiation oncology clinic for routine followup in one month. I advised him to call or return sooner if he has any questions or concerns related to his recovery or treatment. ________________________________  Sheral Apley. Tammi Klippel, M.D.

## 2015-05-20 NOTE — Therapy (Addendum)
[place], [place], [address] Phone: [phone]   Fax:  [phone]  Physical Therapy Treatment  Patient Details  Name: Colton Nguyen MRN: [id number] Date of Birth: [birth year] Referring Provider: Curt Bears, MD  Encounter Date: 05/07/2015      PT End of Session - 05/07/15 0925    Visit Number 8   Number of Visits 18   Date for PT Re-Evaluation 05/30/15   PT Start Time 1517   PT Stop Time 0927  Pt reports LE fatigue and ready to be done today   PT Time Calculation (min) 40 min   Activity Tolerance Patient tolerated treatment well   Behavior During Therapy [facility] for tasks assessed/performed      Past Medical History  Diagnosis Date  . CHF (congestive heart failure)   . Hypertension   . Hyperlipidemia   . BPH (benign prostatic hyperplasia)   . CAD (coronary artery disease)   . Chronic cardiopulmonary disease   . Nicotine dependence   . Mass of lung   . COPD (chronic obstructive pulmonary disease)   . Shortness of breath dyspnea     with exertion  . Anxiety     situational  . Personal history of kidney stones   . Restless leg     Past Surgical History  Procedure Laterality Date  . Cad ccta 01/31/15    . Right inguinal hernia repair at age 68    . Colonoscopy    . Cervical fusion    . Back surgery    . Video bronchoscopy with endobronchial ultrasound N/A 02/27/2015    Procedure: VIDEO BRONCHOSCOPY WITH ENDOBRONCHIAL ULTRASOUND; Surgeon: Grace Isaac, MD; Location: [place]; Service: Thoracic; Laterality: N/A;  . Lung biopsy N/A 02/27/2015    Procedure: LUNG BIOPSY; Surgeon: Grace Isaac, MD; Location: [place]; Service: Thoracic; Laterality: N/A;    There were no vitals filed for this visit.  Visit Diagnosis: Generalized  weakness  Dyspnea  Difficulty walking  Personal history of fall  Weakness of right lower extremity      Subjective Assessment - 05/07/15 0851    Subjective Doing good, fell like I can pick my Rt leg up now without dragging it when I walk. Can tell my endurance is better and my balance even feels some improved.   Currently in Pain? No/denies                         [facility] Adult PT Treatment/Exercise - 05/07/15 0001    Knee/Hip Exercises: Aerobic   Nustep Level 5 x5 minutes   Knee/Hip Exercises: Machines for Strengthening   Cybex Leg Press 60 lbs, 25 reps   Knee/Hip Exercises: Standing   Heel Raises Both;2 sets;10 reps  Standing on blue oval in // bars   Hip Flexion Stengthening;Both;10 reps  2 lbs added to each ankle with SLS on blue oval   Hip Flexion Limitations In // bars   Hip Abduction Stengthening;Both;10 reps  2 lbs each ankle with SLS on blue oval   Abduction Limitations In // bars   Hip Extension Stengthening;Both;10 reps  2 lbs added to each ankle with SLS on blue oval   Extension Limitations In // bars   Walking with Sports Cord 3 plates for all 4 directions 3 reps each with SBA>CGA today, used towel at belt for comfort Lt sidestep most challenging.   Other Standing Knee Exercises In // bars: High knee  slow marching 2 sets and then bil sidestepping 1 set each direction 2 lbs on each ankle for both activities; Heel-toe walking front and then back 4 sets each; grapevine 2 sets each direction with fingertip support thoughout;                    Short Term Clinic Goals - 05/07/15 [id number]    CC Short Term Goal #1   Title Patient will be able to tolerate his initial home exercise program to increase leg strength   Status Achieved   CC Short Term Goal #4   Title Patient will be able to report a >/= 25% improvement in his ability to tolerate household and daily tasks without  feeling fatigued or needing to rest.  Pt reports 60-65% improved with endurance   Status Achieved         Long Term Clinic Goals - 05/07/15 3818    CC Long Term Goal #1   Title Patient will be able to perform his final home exercise program to increase leg strength and verbalize understanding of safe self-progression   Status On-going   CC Long Term Goal #2   Title Patient will be able to improve his 6 minute walk test to report BORG Dyspnea score of </=1 (very mild shortness of breath)   Status On-going   CC Long Term Goal #3   Title Patient will be able to improve his 6 minute walk test to report BORG ratings of perceived exertion scale score of </=9 (veryy light)   Status On-going   CC Long Term Goal #4   Title Patient will be able to report a >/= 50% improvement in his ability to tolerate household and daily tasks without feeling fatigued or needing to rest.  60-65% improvement reported at this time 05/07/15   Status Achieved   CC Long Term Goal #5   Title Patient will be able to increase bilateral leg strength to >/= 4/5 for increased ease with ambulation and squatting   Status On-going            Plan - 05/07/15 0925    Clinical Impression Statement Pt is making great progress towards goals and can tell good improvements at home with ADLs. Some goals met today, see goal section.   Pt will benefit from skilled therapeutic intervention in order to improve on the following deficits Decreased strength;Difficulty walking;Decreased activity tolerance;Decreased endurance   Rehab Potential Excellent   Clinical Impairments Affecting Rehab Potential Current chemoradiation treatment   PT Frequency 2x / week   PT Duration 8 weeks   PT Treatment/Interventions Patient/family education;Energy conservation;Therapeutic exercise;Functional mobility training;ADLs/Self Care Home Management   PT Next Visit Plan  Cont progressing proprioceptive activies>LE strength as tolerated. Retest 6 min walk test next visit to assess progress with this,    Consulted and Agree with Plan of Care Patient             Otelia Limes, PTA 05/20/2015, 12:23 PM  [place], [place], [address] Phone: [phone]   Fax:  [phone]   PHYSICAL THERAPY DISCHARGE SUMMARY  Visits from Start of Care: 8  Current functional level related to goals / functional outcomes: Goals partially met but he is pleased with his progress and is doing his home exercises so he requests discharge at this time.  See above goal achievement.  Remaining deficits: None that are not being addressed by his home exercise program  Education / Equipment: none  Plan: Patient agrees to discharge.  Patient goals were partially met. Patient is being discharged due to being pleased with the current functional level.  ?????       Annia Friendly, [place] 05/27/2015 9:30 AM

## 2015-05-29 ENCOUNTER — Telehealth: Payer: Self-pay | Admitting: *Deleted

## 2015-05-29 NOTE — Telephone Encounter (Signed)
Oncology Nurse Navigator Documentation  Oncology Nurse Navigator Flowsheets 05/29/2015  Navigator Encounter Type Other/I called and spoke with patient today about smoking cessation.  We went over different strategies to help him quit.  I stated at his next appt next week, we can discuss further if needed.  He was thankful for the call.   Patient Visit Type Follow-up  Treatment Phase Treatment  Barriers/Navigation Needs Education  Education Smoking cessation  Interventions -  Education Method Verbal  Time Spent with Patient 15

## 2015-05-31 ENCOUNTER — Ambulatory Visit (HOSPITAL_COMMUNITY)
Admission: RE | Admit: 2015-05-31 | Discharge: 2015-05-31 | Disposition: A | Discharge status: Home / Self Care | Payer: Medicare Other | Source: Ambulatory Visit | Attending: Physician Assistant | Admitting: Physician Assistant

## 2015-05-31 ENCOUNTER — Other Ambulatory Visit (HOSPITAL_BASED_OUTPATIENT_CLINIC_OR_DEPARTMENT_OTHER): Payer: Medicare Other

## 2015-05-31 ENCOUNTER — Encounter (HOSPITAL_COMMUNITY): Payer: Self-pay

## 2015-05-31 DIAGNOSIS — C3491 Malignant neoplasm of unspecified part of right bronchus or lung: Secondary | ICD-10-CM | POA: Diagnosis not present

## 2015-05-31 DIAGNOSIS — D3501 Benign neoplasm of right adrenal gland: Secondary | ICD-10-CM | POA: Diagnosis not present

## 2015-05-31 DIAGNOSIS — R911 Solitary pulmonary nodule: Secondary | ICD-10-CM | POA: Insufficient documentation

## 2015-05-31 DIAGNOSIS — D3502 Benign neoplasm of left adrenal gland: Secondary | ICD-10-CM | POA: Insufficient documentation

## 2015-05-31 DIAGNOSIS — C3411 Malignant neoplasm of upper lobe, right bronchus or lung: Secondary | ICD-10-CM | POA: Insufficient documentation

## 2015-05-31 LAB — CBC WITH DIFFERENTIAL/PLATELET
BASO%: 0.8 % (ref 0.0–2.0)
BASOS ABS: 0 10*3/uL (ref 0.0–0.1)
EOS%: 2.3 % (ref 0.0–7.0)
Eosinophils Absolute: 0.1 10*3/uL (ref 0.0–0.5)
HEMATOCRIT: 40.5 % (ref 38.4–49.9)
HGB: 13.5 g/dL (ref 13.0–17.1)
LYMPH#: 0.7 10*3/uL — AB (ref 0.9–3.3)
LYMPH%: 13.1 % — ABNORMAL LOW (ref 14.0–49.0)
MCH: 27.6 pg (ref 27.2–33.4)
MCHC: 33.3 g/dL (ref 32.0–36.0)
MCV: 82.9 fL (ref 79.3–98.0)
MONO#: 0.6 10*3/uL (ref 0.1–0.9)
MONO%: 11.6 % (ref 0.0–14.0)
NEUT#: 4 10*3/uL (ref 1.5–6.5)
NEUT%: 72.2 % (ref 39.0–75.0)
PLATELETS: 235 10*3/uL (ref 140–400)
RBC: 4.89 10*6/uL (ref 4.20–5.82)
RDW: 20.6 % — ABNORMAL HIGH (ref 11.0–14.6)
WBC: 5.5 10*3/uL (ref 4.0–10.3)

## 2015-05-31 LAB — COMPREHENSIVE METABOLIC PANEL (CC13)
ALK PHOS: 105 U/L (ref 40–150)
ALT: 14 U/L (ref 0–55)
ANION GAP: 8 meq/L (ref 3–11)
AST: 16 U/L (ref 5–34)
Albumin: 3.4 g/dL — ABNORMAL LOW (ref 3.5–5.0)
BUN: 9.9 mg/dL (ref 7.0–26.0)
CALCIUM: 9.1 mg/dL (ref 8.4–10.4)
CHLORIDE: 106 meq/L (ref 98–109)
CO2: 27 mEq/L (ref 22–29)
CREATININE: 1.1 mg/dL (ref 0.7–1.3)
EGFR: 81 mL/min/{1.73_m2} — ABNORMAL LOW (ref >90)
Glucose: 107 mg/dl (ref 70–140)
POTASSIUM: 4 meq/L (ref 3.5–5.1)
Sodium: 141 mEq/L (ref 136–145)
Total Bilirubin: 0.59 mg/dL (ref 0.20–1.20)
Total Protein: 6.7 g/dL (ref 6.4–8.3)

## 2015-05-31 MED ORDER — IOHEXOL 300 MG/ML  SOLN
75.0000 mL | Freq: Once | INTRAMUSCULAR | Status: AC | PRN
  Administered 2015-05-31: 75 mL via INTRAVENOUS

## 2015-06-03 ENCOUNTER — Ambulatory Visit (HOSPITAL_BASED_OUTPATIENT_CLINIC_OR_DEPARTMENT_OTHER): Payer: Medicare Other | Admitting: Internal Medicine

## 2015-06-03 ENCOUNTER — Encounter: Payer: Self-pay | Admitting: Internal Medicine

## 2015-06-03 VITALS — BP 136/69 | HR 77 | Temp 98.5°F | Resp 17 | Ht 68.0 in | Wt 206.5 lb

## 2015-06-03 DIAGNOSIS — C3411 Malignant neoplasm of upper lobe, right bronchus or lung: Secondary | ICD-10-CM | POA: Diagnosis present

## 2015-06-03 DIAGNOSIS — Z720 Tobacco use: Secondary | ICD-10-CM

## 2015-06-03 DIAGNOSIS — Z7689 Persons encountering health services in other specified circumstances: Secondary | ICD-10-CM | POA: Insufficient documentation

## 2015-06-03 DIAGNOSIS — Z5111 Encounter for antineoplastic chemotherapy: Secondary | ICD-10-CM

## 2015-06-03 NOTE — Progress Notes (Signed)
[facility] [phone]   [phone]  OFFICE PROGRESS NOTE  [name], [place] [address]  DIAGNOSIS: Stage IIIA (T3, N2, M0) non-small cell lung cancer, squamous cell carcinoma diagnosed in June 2016.  PRIOR THERAPY: Concurrent chemoradiation with weekly carboplatin for an AUC of 2 and paclitaxel 45 mg/m2 with partial response.    CURRENT THERAPY: Consolidation systemic chemotherapy with carboplatin for AUC of 5 and paclitaxel 175 MG/M2 every 3 weeks. First cycle 06/10/2015  INTERVAL HISTORY: Colton Nguyen 69 y.o. male returns to the clinic today for follow-up visit. The patient completed a course of concurrent chemoradiation with weekly carboplatin and paclitaxel and tolerated his treatment fairly well with no significant adverse effects. He denied having any significant nausea or vomiting, no fever or chills. He denied having any significant weight loss or night sweats. He has no dysphagia or odynophagia. The patient denied having any significant chest pain, shortness breath, cough or hemoptysis. Unfortunately he continues to smoke but down to one pack per day and he is trying to quit. He had repeat CT scan of the chest performed recently and he is here for evaluation and discussion of his scan results.  MEDICAL HISTORY: Past Medical History  Diagnosis Date  . CHF (congestive heart failure)   . Hypertension   . Hyperlipidemia   . BPH (benign prostatic hyperplasia)   . CAD (coronary artery disease)   . Chronic cardiopulmonary disease   . Nicotine dependence   . Mass of lung   . COPD (chronic obstructive pulmonary disease)   . Shortness of breath dyspnea     with exertion  . Anxiety     situational  . Personal history of kidney stones   . Restless leg     ALLERGIES:  has No Known Allergies.  MEDICATIONS:  Current Outpatient Prescriptions  Medication Sig Dispense Refill  . amLODipine (NORVASC) 10 MG  tablet Take 10 mg by mouth daily.     Marland Kitchen aspirin 81 MG tablet Take 81 mg by mouth daily.    Marland Kitchen atorvastatin (LIPITOR) 40 MG tablet Take 40 mg by mouth daily.     Marland Kitchen BYSTOLIC 20 MG TABS Take 20 mg by mouth daily.     . cetirizine (ZYRTEC) 5 MG chewable tablet Chew 5 mg by mouth daily.    . CHANTIX STARTING MONTH PAK 0.5 MG X 11 & 1 MG X 42 tablet Take 0.5-1 mg by mouth as directed.     . Cholecalciferol (VITAMIN D3) 5000 UNITS TABS Take 5,000 Units by mouth daily.    . finasteride (PROSCAR) 5 MG tablet Take 5 mg by mouth daily.     . fluticasone (FLONASE) 50 MCG/ACT nasal spray     . losartan (COZAAR) 100 MG tablet Take 100 mg by mouth daily.     Marland Kitchen PROAIR HFA 108 (90 BASE) MCG/ACT inhaler Inhale 2 puffs into the lungs every 4 (four) hours as needed for wheezing or shortness of breath.     . prochlorperazine (COMPAZINE) 10 MG tablet Take 1 tablet (10 mg total) by mouth every 6 (six) hours as needed for nausea or vomiting. 30 tablet 0  . tamsulosin (FLOMAX) 0.4 MG CAPS capsule Take 0.4 mg by mouth daily.     . Wound Cleansers (RADIAPLEX EX) Apply topically.     No current facility-administered medications for this visit.    SURGICAL HISTORY:  Past Surgical History  Procedure Laterality Date  .  Cad ccta 01/31/15    . Right inguinal hernia repair at age 15    . Colonoscopy    . Cervical fusion    . Back surgery    . Video bronchoscopy with endobronchial ultrasound N/A 02/27/2015    Procedure: VIDEO BRONCHOSCOPY WITH ENDOBRONCHIAL ULTRASOUND;  Surgeon: Grace Isaac, MD;  Location: [facility];  Service: Thoracic;  Laterality: N/A;  . Lung biopsy N/A 02/27/2015    Procedure: LUNG BIOPSY;  Surgeon: Grace Isaac, MD;  Location: [place];  Service: Thoracic;  Laterality: N/A;    REVIEW OF SYSTEMS:  Constitutional: negative Eyes: negative Ears, nose, mouth, throat, and face: negative Respiratory: negative Cardiovascular: negative Gastrointestinal:  negative Genitourinary:negative Integument/breast: negative Hematologic/lymphatic: negative Musculoskeletal:negative Neurological: negative Behavioral/Psych: negative Endocrine: negative Allergic/Immunologic: negative   PHYSICAL EXAMINATION: General appearance: alert, cooperative and no distress Head: Normocephalic, without obvious abnormality, atraumatic Neck: no adenopathy, no JVD, supple, symmetrical, trachea midline and thyroid not enlarged, symmetric, no tenderness/mass/nodules Lymph nodes: Cervical, supraclavicular, and axillary nodes normal. Resp: clear to auscultation bilaterally Back: symmetric, no curvature. ROM normal. No CVA tenderness. Cardio: regular rate and rhythm, S1, S2 normal, no murmur, click, rub or gallop GI: soft, non-tender; bowel sounds normal; no masses,  no organomegaly Extremities: extremities normal, atraumatic, no cyanosis or edema Neurologic: Alert and oriented X 3, normal strength and tone. Normal symmetric reflexes. Normal coordination and gait  ECOG PERFORMANCE STATUS: 0 - Asymptomatic  Blood pressure 136/69, pulse 77, temperature 98.5 F (36.9 C), temperature source Oral, resp. rate 17, height '5\' 8"'$  (1.727 m), weight 206 lb 8 oz (93.668 kg), SpO2 97 %.  LABORATORY DATA: Lab Results  Component Value Date   WBC 5.5 05/31/2015   HGB 13.5 05/31/2015   HCT 40.5 05/31/2015   MCV 82.9 05/31/2015   PLT 235 05/31/2015      Chemistry      Component Value Date/Time   NA 141 05/31/2015 0848   NA 136 02/27/2015 0701   K 4.0 05/31/2015 0848   K 4.3 02/27/2015 0701   CL 102 02/27/2015 0701   CO2 27 05/31/2015 0848   CO2 22 02/27/2015 0701   BUN 9.9 05/31/2015 0848   BUN 11 02/27/2015 0701   CREATININE 1.1 05/31/2015 0848   CREATININE 1.07 02/27/2015 0701      Component Value Date/Time   CALCIUM 9.1 05/31/2015 0848   CALCIUM 8.7* 02/27/2015 0701   ALKPHOS 105 05/31/2015 0848   ALKPHOS 103 02/27/2015 0701   AST 16 05/31/2015 0848   AST 28  02/27/2015 0701   ALT 14 05/31/2015 0848   ALT 17 02/27/2015 0701   BILITOT 0.59 05/31/2015 0848   BILITOT 0.7 02/27/2015 0701       RADIOGRAPHIC STUDIES: Ct Chest W Contrast  05/31/2015   CLINICAL DATA:  Restaging non small cell lung cancer.  EXAM: CT CHEST WITH CONTRAST  TECHNIQUE: Multidetector CT imaging of the chest was performed during intravenous contrast administration.  CONTRAST:  67m OMNIPAQUE IOHEXOL 300 MG/ML  SOLN  COMPARISON:  PET-CT 02/20/2015  FINDINGS: Mediastinum/Nodes: No chest wall mass, supraclavicular or axillary lymphadenopathy. Stable multinodular thyroid goiter.  The heart is normal in size. No pericardial effusion. No mediastinal or hilar mass or lymphadenopathy. The aorta is normal in caliber. Stable atherosclerotic calcifications. No dissection. The pulmonary arteries appear normal. Coronary artery calcifications are stable.  A pretracheal lymph node on image number 19 measures 6.5 mm and previously measured 9 mm.  Right paratracheal lymph node on image 23 measures  8 mm and previously measured 15 mm.  Right hilar lymph node on image 25 measures a maximum of 11 mm and previously measured 3 cm.  Lungs/Pleura: The right upper lobe mass is no longer measurable. There are radiation changes involving the right upper lobe but no discernible mass lesion. Mild right upper lobe bronchiectasis is noted.  The 9 mm pulmonary nodule in the superior segment of the right lower lobe now measures 6 mm. A few small scattered subpleural nodules are unchanged and likely represent lymph nodes. No new pulmonary lesions. Stable emphysematous changes. No pleural effusion or acute pulmonary findings.  Upper abdomen: Stable bilateral adrenal gland nodules. These are likely benign adenomas and are not metabolically active on the prior PET-CT. No findings for hepatic metastatic disease. Area of somewhat linear contrast enhancement are hyper attenuation the right hepatic lobe on image number 60 is not  demonstrated on the prior PET-CT and is most likely a vascular shunt.  Musculoskeletal: No significant osseous abnormalities.  IMPRESSION: 1. Chest CT demonstrates an excellent response to therapy. The large right upper lobe lung mass is no longer present. There are radiation changes noted. Interval decrease in size of mediastinal and right hilar lymph nodes. 2. Interval decrease in size of the hypermetabolic 9 mm superior segment right lower lobe lung nodule. No new pulmonary lesions. 3. Stable bilateral adrenal gland adenomas. 4. Area of increased attenuation/enhancement in the right hepatic lobe is most likely some type of vascular shunt.   Electronically Signed   By: Marijo Sanes M.D.   On: 05/31/2015 11:02    ASSESSMENT AND PLAN: This is a very pleasant 69 years old African-American male with a stage IIIa non-small cell lung cancer. The patient completed a course of concurrent chemoradiation with excellent response to his treatment. I discussed the scan results with the patient today. I gave him the option of continuous observation and close monitoring versus proceeding with 3 cycles of consolidation chemotherapy with carboplatin for AUC of 5 and paclitaxel 175 MG/M2 every 3 weeks. The patient is interested in proceeding with the consolidation chemotherapy. I discussed with him the adverse effect of this treatment including but not limited to alopecia, myelosuppression, nausea and vomiting, peripheral neuropathy, liver or renal dysfunction. He is expected to start the first cycle of this treatment next week. The patient was counseled today regarding smoke cessation and he is trying to quit smoking and getting some patchy from 1 800 quit now. He would come back for follow-up visit in 4 weeks with the start of cycle #2. He was advised to call immediately if he has any concerning symptoms in the interval. The patient voices understanding of current disease status and treatment options and is in agreement  with the current care plan.  All questions were answered. The patient knows to call the clinic with any problems, questions or concerns. We can certainly see the patient much sooner if necessary.  Disclaimer: This note was dictated with voice recognition software. Similar sounding words can inadvertently be transcribed and may not be corrected upon review.

## 2015-06-04 ENCOUNTER — Telehealth: Payer: Self-pay | Admitting: Internal Medicine

## 2015-06-04 ENCOUNTER — Telehealth: Payer: Self-pay | Admitting: *Deleted

## 2015-06-04 NOTE — Telephone Encounter (Signed)
sw. pt and advised on OCT appt...pt ok and aware he will get complete sched on chemo day

## 2015-06-04 NOTE — Telephone Encounter (Signed)
Per staff message and POF I have scheduled appts. Advised scheduler of appts. JMW  

## 2015-06-06 ENCOUNTER — Ambulatory Visit
Admission: RE | Admit: 2015-06-06 | Discharge: 2015-06-06 | Disposition: A | Discharge status: Home / Self Care | Payer: Medicare Other | Source: Ambulatory Visit | Attending: Radiation Oncology | Admitting: Radiation Oncology

## 2015-06-06 ENCOUNTER — Encounter: Payer: Self-pay | Admitting: Radiation Oncology

## 2015-06-06 VITALS — BP 131/76 | HR 77 | Resp 18 | Wt 206.9 lb

## 2015-06-06 DIAGNOSIS — R918 Other nonspecific abnormal finding of lung field: Secondary | ICD-10-CM

## 2015-06-06 DIAGNOSIS — C3411 Malignant neoplasm of upper lobe, right bronchus or lung: Secondary | ICD-10-CM

## 2015-06-06 NOTE — Progress Notes (Addendum)
Weight and vitals stable. Denies pain. Reports a cough that is occasionally productive with a small amount of clear sputum. Denies hemoptysis. Denies painful or difficult swallowing. Denies chest pain. Reports SOB with exertion. Reports fatigue continues.  BP 131/76 mmHg  Pulse 77  Resp 18  Wt 206 lb 14.4 oz (93.849 kg)  SpO2 94% Wt Readings from Last 3 Encounters:  06/06/15 206 lb 14.4 oz (93.849 kg)  06/03/15 206 lb 8 oz (93.668 kg)  05/01/15 206 lb 12.8 oz (93.804 kg)

## 2015-06-06 NOTE — Progress Notes (Signed)
Radiation Oncology         [phone] ________________________________  Name: Colton Nguyen MRN: [id number]  Date: 06/06/2015  DOB: [birth year]    Follow-Up Visit Note  CC: Colton Market, MD  Colton Isaac, MD  Diagnosis:   Colton Nguyen is a 69 year old gentleman presenting to clinic in regards to his Stage IIIA (T3, N2, M0) non-small cell lung cancer, squamous cell carcinoma.    ICD-9-CM ICD-10-CM   1. spiculated right upper lobe mass 786.6 R91.8   2. Squamous cell carcinoma of lung, stage III 162.3 C34.11     Interval Since Last Radiation:  1  months  Narrative:  The patient returns today for routine follow-up.  Weight and vitals stable. Denies pain. Reports a cough that is occasionally productive with a small amount of clear sputum. Denies hemoptysis. Denies painful or difficult swallowing. Denies chest pain. CT scan performed on 9/23. He will begin chemotherapy on Monday, 10/3.                               ALLERGIES:  has No Known Allergies.  Meds: Current Outpatient Prescriptions  Medication Sig Dispense Refill  . amLODipine (NORVASC) 10 MG tablet Take 10 mg by mouth daily.     Marland Kitchen aspirin 81 MG tablet Take 81 mg by mouth daily.    Marland Kitchen atorvastatin (LIPITOR) 40 MG tablet Take 40 mg by mouth daily.     Marland Kitchen BYSTOLIC 20 MG TABS Take 20 mg by mouth daily.     . cetirizine (ZYRTEC) 5 MG chewable tablet Chew 5 mg by mouth daily.    . CHANTIX STARTING MONTH PAK 0.5 MG X 11 & 1 MG X 42 tablet Take 0.5-1 mg by mouth as directed.     . Cholecalciferol (VITAMIN D3) 5000 UNITS TABS Take 5,000 Units by mouth daily.    . finasteride (PROSCAR) 5 MG tablet Take 5 mg by mouth daily.     . fluticasone (FLONASE) 50 MCG/ACT nasal spray     . losartan (COZAAR) 100 MG tablet Take 100 mg by mouth daily.     Marland Kitchen PROAIR HFA 108 (90 BASE) MCG/ACT inhaler Inhale 2 puffs into the lungs every 4 (four) hours as needed for wheezing or shortness of breath.     . tamsulosin (FLOMAX) 0.4 MG CAPS  capsule Take 0.4 mg by mouth daily.     . Wound Cleansers (RADIAPLEX EX) Apply topically.    . prochlorperazine (COMPAZINE) 10 MG tablet Take 1 tablet (10 mg total) by mouth every 6 (six) hours as needed for nausea or vomiting. (Patient not taking: Reported on 06/06/2015) 30 tablet 0   No current facility-administered medications for this encounter.    Physical Findings: The patient is in no acute distress. Patient is alert and oriented.  weight is 206 lb 14.4 oz (93.849 kg). His blood pressure is 131/76 and his pulse is 77. His respiration is 18 and oxygen saturation is 94%. .  No significant changes.  Lab Findings: Lab Results  Component Value Date   WBC 5.5 05/31/2015   WBC 7.2 02/27/2015   HGB 13.5 05/31/2015   HGB 15.5 02/27/2015   HCT 40.5 05/31/2015   HCT 45.2 02/27/2015   PLT 235 05/31/2015   PLT 180 02/27/2015    Lab Results  Component Value Date   NA 141 05/31/2015   NA 136 02/27/2015   K 4.0 05/31/2015   K  4.3 02/27/2015   CHLORIDE 106 05/31/2015   CO2 27 05/31/2015   CO2 22 02/27/2015   GLUCOSE 107 05/31/2015   GLUCOSE 108* 02/27/2015   BUN 9.9 05/31/2015   BUN 11 02/27/2015   CREATININE 1.1 05/31/2015   CREATININE 1.07 02/27/2015   BILITOT 0.59 05/31/2015   BILITOT 0.7 02/27/2015   ALKPHOS 105 05/31/2015   ALKPHOS 103 02/27/2015   AST 16 05/31/2015   AST 28 02/27/2015   ALT 14 05/31/2015   ALT 17 02/27/2015   PROT 6.7 05/31/2015   PROT 6.8 02/27/2015   ALBUMIN 3.4* 05/31/2015   ALBUMIN 3.5 02/27/2015   CALCIUM 9.1 05/31/2015   CALCIUM 8.7* 02/27/2015   ANIONGAP 8 05/31/2015   ANIONGAP 12 02/27/2015    Radiographic Findings: Ct Chest W Contrast  05/31/2015   CLINICAL DATA:  Restaging non small cell lung cancer.  EXAM: CT CHEST WITH CONTRAST  TECHNIQUE: Multidetector CT imaging of the chest was performed during intravenous contrast administration.  CONTRAST:  48m OMNIPAQUE IOHEXOL 300 MG/ML  SOLN  COMPARISON:  PET-CT 02/20/2015  FINDINGS:  Mediastinum/Nodes: No chest wall mass, supraclavicular or axillary lymphadenopathy. Stable multinodular thyroid goiter.  The heart is normal in size. No pericardial effusion. No mediastinal or hilar mass or lymphadenopathy. The aorta is normal in caliber. Stable atherosclerotic calcifications. No dissection. The pulmonary arteries appear normal. Coronary artery calcifications are stable.  A pretracheal lymph node on image number 19 measures 6.5 mm and previously measured 9 mm.  Right paratracheal lymph node on image 23 measures 8 mm and previously measured 15 mm.  Right hilar lymph node on image 25 measures a maximum of 11 mm and previously measured 3 cm.  Lungs/Pleura: The right upper lobe mass is no longer measurable. There are radiation changes involving the right upper lobe but no discernible mass lesion. Mild right upper lobe bronchiectasis is noted.  The 9 mm pulmonary nodule in the superior segment of the right lower lobe now measures 6 mm. A few small scattered subpleural nodules are unchanged and likely represent lymph nodes. No new pulmonary lesions. Stable emphysematous changes. No pleural effusion or acute pulmonary findings.  Upper abdomen: Stable bilateral adrenal gland nodules. These are likely benign adenomas and are not metabolically active on the prior PET-CT. No findings for hepatic metastatic disease. Area of somewhat linear contrast enhancement are hyper attenuation the right hepatic lobe on image number 60 is not demonstrated on the prior PET-CT and is most likely a vascular shunt.  Musculoskeletal: No significant osseous abnormalities.  IMPRESSION: 1. Chest CT demonstrates an excellent response to therapy. The large right upper lobe lung mass is no longer present. There are radiation changes noted. Interval decrease in size of mediastinal and right hilar lymph nodes. 2. Interval decrease in size of the hypermetabolic 9 mm superior segment right lower lobe lung nodule. No new pulmonary lesions.  3. Stable bilateral adrenal gland adenomas. 4. Area of increased attenuation/enhancement in the right hepatic lobe is most likely some type of vascular shunt.   Electronically Signed   By: PMarijo SanesM.D.   On: 05/31/2015 11:02    Impression:  The patient is recovering from the effects of radiation.  His follow up CT shows an excellent response to therapy.  Plan:  The patient is continuing to receive chemotherapy and will follow up with medical oncology accordingly. He will follow up in radiation oncology as needed.  This document serves as a record of services personally performed by MTyler Pita MD.  It was created on his behalf by Arlyce Harman, a trained medical scribe. The creation of this record is based on the scribe's personal observations and the provider's statements to them. This document has been checked and approved by the attending provider.    _____________________________________  Sheral Apley. Tammi Klippel, M.D.

## 2015-06-10 ENCOUNTER — Ambulatory Visit (HOSPITAL_BASED_OUTPATIENT_CLINIC_OR_DEPARTMENT_OTHER): Payer: Medicare Other

## 2015-06-10 ENCOUNTER — Other Ambulatory Visit (HOSPITAL_BASED_OUTPATIENT_CLINIC_OR_DEPARTMENT_OTHER): Payer: Medicare Other

## 2015-06-10 DIAGNOSIS — C3411 Malignant neoplasm of upper lobe, right bronchus or lung: Secondary | ICD-10-CM | POA: Diagnosis present

## 2015-06-10 DIAGNOSIS — Z5111 Encounter for antineoplastic chemotherapy: Secondary | ICD-10-CM | POA: Diagnosis present

## 2015-06-10 LAB — CBC WITH DIFFERENTIAL/PLATELET
BASO%: 0.9 % (ref 0.0–2.0)
Basophils Absolute: 0 10*3/uL (ref 0.0–0.1)
EOS%: 4 % (ref 0.0–7.0)
Eosinophils Absolute: 0.2 10*3/uL (ref 0.0–0.5)
HEMATOCRIT: 40.3 % (ref 38.4–49.9)
HGB: 13.2 g/dL (ref 13.0–17.1)
LYMPH#: 0.7 10*3/uL — AB (ref 0.9–3.3)
LYMPH%: 14.1 % (ref 14.0–49.0)
MCH: 27.8 pg (ref 27.2–33.4)
MCHC: 32.9 g/dL (ref 32.0–36.0)
MCV: 84.5 fL (ref 79.3–98.0)
MONO#: 0.6 10*3/uL (ref 0.1–0.9)
MONO%: 11.8 % (ref 0.0–14.0)
NEUT%: 69.2 % (ref 39.0–75.0)
NEUTROS ABS: 3.3 10*3/uL (ref 1.5–6.5)
Platelets: 235 10*3/uL (ref 140–400)
RBC: 4.76 10*6/uL (ref 4.20–5.82)
RDW: 20.9 % — ABNORMAL HIGH (ref 11.0–14.6)
WBC: 4.8 10*3/uL (ref 4.0–10.3)

## 2015-06-10 LAB — COMPREHENSIVE METABOLIC PANEL (CC13)
ALT: 17 U/L (ref 0–55)
AST: 17 U/L (ref 5–34)
Albumin: 3.3 g/dL — ABNORMAL LOW (ref 3.5–5.0)
Alkaline Phosphatase: 94 U/L (ref 40–150)
Anion Gap: 8 mEq/L (ref 3–11)
BILIRUBIN TOTAL: 0.4 mg/dL (ref 0.20–1.20)
BUN: 10 mg/dL (ref 7.0–26.0)
CHLORIDE: 109 meq/L (ref 98–109)
CO2: 23 meq/L (ref 22–29)
CREATININE: 1 mg/dL (ref 0.7–1.3)
Calcium: 8.9 mg/dL (ref 8.4–10.4)
EGFR: 90 mL/min/{1.73_m2} — ABNORMAL LOW (ref >90)
Glucose: 105 mg/dl (ref 70–140)
Potassium: 3.8 mEq/L (ref 3.5–5.1)
Sodium: 140 mEq/L (ref 136–145)
TOTAL PROTEIN: 6.4 g/dL (ref 6.4–8.3)

## 2015-06-10 MED ORDER — PACLITAXEL CHEMO INJECTION 300 MG/50ML
175.0000 mg/m2 | Freq: Once | INTRAVENOUS | Status: AC
  Administered 2015-06-10: 372 mg via INTRAVENOUS
  Filled 2015-06-10: qty 62

## 2015-06-10 MED ORDER — SODIUM CHLORIDE 0.9 % IJ SOLN
10.0000 mL | INTRAMUSCULAR | Status: DC | PRN
  Filled 2015-06-10: qty 10

## 2015-06-10 MED ORDER — SODIUM CHLORIDE 0.9 % IV SOLN
551.0000 mg | Freq: Once | INTRAVENOUS | Status: AC
  Administered 2015-06-10: 550 mg via INTRAVENOUS
  Filled 2015-06-10: qty 55

## 2015-06-10 MED ORDER — SODIUM CHLORIDE 0.9 % IV SOLN
Freq: Once | INTRAVENOUS | Status: AC
  Administered 2015-06-10: 10:00:00 via INTRAVENOUS

## 2015-06-10 MED ORDER — DIPHENHYDRAMINE HCL 50 MG/ML IJ SOLN
50.0000 mg | Freq: Once | INTRAMUSCULAR | Status: AC
  Administered 2015-06-10: 50 mg via INTRAVENOUS

## 2015-06-10 MED ORDER — HEPARIN SOD (PORK) LOCK FLUSH 100 UNIT/ML IV SOLN
500.0000 [IU] | Freq: Once | INTRAVENOUS | Status: DC | PRN
  Filled 2015-06-10: qty 5

## 2015-06-10 MED ORDER — FAMOTIDINE IN NACL 20-0.9 MG/50ML-% IV SOLN
INTRAVENOUS | Status: AC
  Filled 2015-06-10: qty 50

## 2015-06-10 MED ORDER — DIPHENHYDRAMINE HCL 50 MG/ML IJ SOLN
INTRAMUSCULAR | Status: AC
  Filled 2015-06-10: qty 1

## 2015-06-10 MED ORDER — FAMOTIDINE IN NACL 20-0.9 MG/50ML-% IV SOLN
20.0000 mg | Freq: Once | INTRAVENOUS | Status: AC
  Administered 2015-06-10: 20 mg via INTRAVENOUS

## 2015-06-10 MED ORDER — SODIUM CHLORIDE 0.9 % IV SOLN
Freq: Once | INTRAVENOUS | Status: AC
  Administered 2015-06-10: 10:00:00 via INTRAVENOUS
  Filled 2015-06-10: qty 8

## 2015-06-12 ENCOUNTER — Ambulatory Visit (HOSPITAL_BASED_OUTPATIENT_CLINIC_OR_DEPARTMENT_OTHER): Payer: Medicare Other

## 2015-06-12 VITALS — BP 135/77 | HR 70 | Temp 98.4°F | Resp 16

## 2015-06-12 DIAGNOSIS — Z5189 Encounter for other specified aftercare: Secondary | ICD-10-CM | POA: Diagnosis not present

## 2015-06-12 DIAGNOSIS — C3411 Malignant neoplasm of upper lobe, right bronchus or lung: Secondary | ICD-10-CM

## 2015-06-12 MED ORDER — PEGFILGRASTIM INJECTION 6 MG/0.6ML ~~LOC~~
6.0000 mg | PREFILLED_SYRINGE | Freq: Once | SUBCUTANEOUS | Status: AC
  Administered 2015-06-12: 6 mg via SUBCUTANEOUS
  Filled 2015-06-12: qty 0.6

## 2015-06-17 ENCOUNTER — Other Ambulatory Visit (HOSPITAL_BASED_OUTPATIENT_CLINIC_OR_DEPARTMENT_OTHER): Payer: Medicare Other

## 2015-06-17 DIAGNOSIS — C3411 Malignant neoplasm of upper lobe, right bronchus or lung: Secondary | ICD-10-CM

## 2015-06-17 DIAGNOSIS — C3491 Malignant neoplasm of unspecified part of right bronchus or lung: Secondary | ICD-10-CM

## 2015-06-17 LAB — COMPREHENSIVE METABOLIC PANEL (CC13)
ALK PHOS: 126 U/L (ref 40–150)
ALT: 16 U/L (ref 0–55)
ANION GAP: 11 meq/L (ref 3–11)
AST: 15 U/L (ref 5–34)
Albumin: 3.4 g/dL — ABNORMAL LOW (ref 3.5–5.0)
BILIRUBIN TOTAL: 0.43 mg/dL (ref 0.20–1.20)
BUN: 9.2 mg/dL (ref 7.0–26.0)
CALCIUM: 9 mg/dL (ref 8.4–10.4)
CO2: 24 mEq/L (ref 22–29)
CREATININE: 0.9 mg/dL (ref 0.7–1.3)
Chloride: 104 mEq/L (ref 98–109)
EGFR: >90 mL/min/{1.73_m2} (ref >90)
Glucose: 104 mg/dl (ref 70–140)
Potassium: 4.2 mEq/L (ref 3.5–5.1)
Sodium: 138 mEq/L (ref 136–145)
TOTAL PROTEIN: 6.4 g/dL (ref 6.4–8.3)

## 2015-06-17 LAB — CBC WITH DIFFERENTIAL/PLATELET
BASO%: 0.2 % (ref 0.0–2.0)
Basophils Absolute: 0 10*3/uL (ref 0.0–0.1)
EOS ABS: 0.2 10*3/uL (ref 0.0–0.5)
EOS%: 2.4 % (ref 0.0–7.0)
HEMATOCRIT: 38.1 % — AB (ref 38.4–49.9)
HGB: 12.8 g/dL — ABNORMAL LOW (ref 13.0–17.1)
LYMPH%: 8 % — AB (ref 14.0–49.0)
MCH: 28.3 pg (ref 27.2–33.4)
MCHC: 33.6 g/dL (ref 32.0–36.0)
MCV: 84.3 fL (ref 79.3–98.0)
MONO#: 1.4 10*3/uL — AB (ref 0.1–0.9)
MONO%: 13.8 % (ref 0.0–14.0)
NEUT%: 75.6 % — ABNORMAL HIGH (ref 39.0–75.0)
NEUTROS ABS: 7.7 10*3/uL — AB (ref 1.5–6.5)
PLATELETS: 145 10*3/uL (ref 140–400)
RBC: 4.52 10*6/uL (ref 4.20–5.82)
RDW: 18.9 % — ABNORMAL HIGH (ref 11.0–14.6)
WBC: 10.2 10*3/uL (ref 4.0–10.3)
lymph#: 0.8 10*3/uL — ABNORMAL LOW (ref 0.9–3.3)
nRBC: 0 % (ref 0–0)

## 2015-06-24 ENCOUNTER — Ambulatory Visit (HOSPITAL_BASED_OUTPATIENT_CLINIC_OR_DEPARTMENT_OTHER): Payer: Medicare Other

## 2015-06-24 DIAGNOSIS — C3411 Malignant neoplasm of upper lobe, right bronchus or lung: Secondary | ICD-10-CM

## 2015-06-24 LAB — CBC WITH DIFFERENTIAL/PLATELET
BASO%: 0.5 % (ref 0.0–2.0)
BASOS ABS: 0 10*3/uL (ref 0.0–0.1)
EOS ABS: 0.1 10*3/uL (ref 0.0–0.5)
EOS%: 1.5 % (ref 0.0–7.0)
HCT: 38.4 % (ref 38.4–49.9)
HEMOGLOBIN: 12.5 g/dL — AB (ref 13.0–17.1)
LYMPH%: 10.6 % — ABNORMAL LOW (ref 14.0–49.0)
MCH: 28.2 pg (ref 27.2–33.4)
MCHC: 32.7 g/dL (ref 32.0–36.0)
MCV: 86.3 fL (ref 79.3–98.0)
MONO#: 0.6 10*3/uL (ref 0.1–0.9)
MONO%: 7.9 % (ref 0.0–14.0)
NEUT#: 5.8 10*3/uL (ref 1.5–6.5)
NEUT%: 79.5 % — ABNORMAL HIGH (ref 39.0–75.0)
Platelets: 147 10*3/uL (ref 140–400)
RBC: 4.45 10*6/uL (ref 4.20–5.82)
RDW: 21.1 % — AB (ref 11.0–14.6)
WBC: 7.2 10*3/uL (ref 4.0–10.3)
lymph#: 0.8 10*3/uL — ABNORMAL LOW (ref 0.9–3.3)

## 2015-06-24 LAB — COMPREHENSIVE METABOLIC PANEL (CC13)
ALBUMIN: 3.2 g/dL — AB (ref 3.5–5.0)
ALK PHOS: 130 U/L (ref 40–150)
ALT: 13 U/L (ref 0–55)
AST: 14 U/L (ref 5–34)
Anion Gap: 9 mEq/L (ref 3–11)
BUN: 9.9 mg/dL (ref 7.0–26.0)
CALCIUM: 8.7 mg/dL (ref 8.4–10.4)
CO2: 24 mEq/L (ref 22–29)
Chloride: 107 mEq/L (ref 98–109)
Creatinine: 1 mg/dL (ref 0.7–1.3)
EGFR: >90 mL/min/{1.73_m2} (ref >90)
GLUCOSE: 103 mg/dL (ref 70–140)
POTASSIUM: 4.2 meq/L (ref 3.5–5.1)
SODIUM: 140 meq/L (ref 136–145)
Total Bilirubin: <0.3 mg/dL (ref 0.20–1.20)
Total Protein: 6.4 g/dL (ref 6.4–8.3)

## 2015-06-25 ENCOUNTER — Telehealth: Payer: Self-pay | Admitting: Medical Oncology

## 2015-06-25 NOTE — Telephone Encounter (Signed)
Asking for analgesic for sprain. I told him that he can take tylenol

## 2015-06-27 ENCOUNTER — Emergency Department (HOSPITAL_COMMUNITY)
Admission: EM | Admit: 2015-06-27 | Discharge: 2015-06-27 | Disposition: A | Discharge status: Home / Self Care | Payer: Medicare Other | Attending: Emergency Medicine | Admitting: Emergency Medicine

## 2015-06-27 ENCOUNTER — Encounter (HOSPITAL_COMMUNITY): Payer: Self-pay | Admitting: Emergency Medicine

## 2015-06-27 ENCOUNTER — Emergency Department (HOSPITAL_COMMUNITY): Payer: Medicare Other

## 2015-06-27 DIAGNOSIS — S8265XA Nondisplaced fracture of lateral malleolus of left fibula, initial encounter for closed fracture: Secondary | ICD-10-CM | POA: Diagnosis not present

## 2015-06-27 DIAGNOSIS — S99912A Unspecified injury of left ankle, initial encounter: Secondary | ICD-10-CM | POA: Diagnosis present

## 2015-06-27 DIAGNOSIS — Z87442 Personal history of urinary calculi: Secondary | ICD-10-CM | POA: Diagnosis not present

## 2015-06-27 DIAGNOSIS — I2510 Atherosclerotic heart disease of native coronary artery without angina pectoris: Secondary | ICD-10-CM | POA: Insufficient documentation

## 2015-06-27 DIAGNOSIS — E785 Hyperlipidemia, unspecified: Secondary | ICD-10-CM | POA: Insufficient documentation

## 2015-06-27 DIAGNOSIS — Z7982 Long term (current) use of aspirin: Secondary | ICD-10-CM | POA: Insufficient documentation

## 2015-06-27 DIAGNOSIS — J449 Chronic obstructive pulmonary disease, unspecified: Secondary | ICD-10-CM | POA: Diagnosis not present

## 2015-06-27 DIAGNOSIS — Z8659 Personal history of other mental and behavioral disorders: Secondary | ICD-10-CM | POA: Insufficient documentation

## 2015-06-27 DIAGNOSIS — N400 Enlarged prostate without lower urinary tract symptoms: Secondary | ICD-10-CM | POA: Insufficient documentation

## 2015-06-27 DIAGNOSIS — Z8669 Personal history of other diseases of the nervous system and sense organs: Secondary | ICD-10-CM | POA: Insufficient documentation

## 2015-06-27 DIAGNOSIS — I10 Essential (primary) hypertension: Secondary | ICD-10-CM | POA: Insufficient documentation

## 2015-06-27 DIAGNOSIS — W1839XA Other fall on same level, initial encounter: Secondary | ICD-10-CM | POA: Insufficient documentation

## 2015-06-27 DIAGNOSIS — Z7951 Long term (current) use of inhaled steroids: Secondary | ICD-10-CM | POA: Diagnosis not present

## 2015-06-27 DIAGNOSIS — Z720 Tobacco use: Secondary | ICD-10-CM | POA: Diagnosis not present

## 2015-06-27 DIAGNOSIS — I509 Heart failure, unspecified: Secondary | ICD-10-CM | POA: Insufficient documentation

## 2015-06-27 DIAGNOSIS — Y9389 Activity, other specified: Secondary | ICD-10-CM | POA: Diagnosis not present

## 2015-06-27 DIAGNOSIS — Y9289 Other specified places as the place of occurrence of the external cause: Secondary | ICD-10-CM | POA: Insufficient documentation

## 2015-06-27 DIAGNOSIS — Y998 Other external cause status: Secondary | ICD-10-CM | POA: Insufficient documentation

## 2015-06-27 DIAGNOSIS — S82892A Other fracture of left lower leg, initial encounter for closed fracture: Secondary | ICD-10-CM

## 2015-06-27 MED ORDER — TRAMADOL HCL 50 MG PO TABS: 50.0000 mg | ORAL_TABLET | Freq: Four times a day (QID) | ORAL | Status: DC | PRN

## 2015-06-27 MED ORDER — TRAMADOL HCL 50 MG PO TABS
50.0000 mg | ORAL_TABLET | Freq: Once | ORAL | Status: AC
  Administered 2015-06-27: 50 mg via ORAL
  Filled 2015-06-27: qty 1

## 2015-06-27 NOTE — Discharge Instructions (Signed)
You have a broken ankle.  Wear the splint, use crutches and do not bear any weight on the left ankle.  Follow up with orthopedist next week for further care.  Take pain medication as needed for your pain.   Fibular Fracture With Rehab The fibula is the smaller of the two lower leg bones and is vulnerable to breaks (fracture). Fibular fractures may go fully through the bone (complete) or partially (incomplete). The bone fragments are rarely out of alignment (displaced fracture). Fibula fractures may occur anywhere along the bone. However, this document only discusses fractures that do not involve a leg joint. Fibular fractures are not often a severe injury because the bone supports only about 17% of the body weight. SYMPTOMS   Moderate to severe pain in the lower leg.  Tenderness and swelling in the leg or calf.  Bleeding and/or bruising (contusion) in the leg.  Inability to bear weight on the injured extremity.  Visible deformity, if the fracture is displaced.  Numbness and coldness in the leg and foot, beyond the fracture site, if blood supply is impaired. CAUSES  Fractures occur when a force is placed on the bone that is greater than it can withstand. Common causes of fibular fracture include:  Direct hit (trauma) (i.e., hockey or lacrosse check to the lower leg).  Stress fracture (weakening of the bone from repeated stress).  Indirect injury, caused by twisting, turning quickly, or violent muscle contraction. RISK INCREASES WITH:  Contact sports (i.e., football, soccer, lacrosse, hockey).  Sports that can cause twisted ankle injury (i.e., skiing, basketball).  Bony abnormalities (i.e., osteoporosis or bone tumors).  Metabolism disorders, hormone problems, and nutrition deficiency and disorders (i.e., anorexia and bulimia).  Poor strength and flexibility. PREVENTION   Warm up and stretch properly before activity.  Maintain physical fitness:  Strength, flexibility, and  endurance.  Cardiovascular fitness.  Wear properly fitted and padded protective equipment (i.e., shin guards for soccer). PROGNOSIS  If treated properly, fibular fractures usually heal in 4 to 6 weeks.  RELATED COMPLICATIONS   Failure of bone to heal (nonunion).  Bone heals in a poor position (malunion).  Increased pressure inside the leg (compartment syndrome) due to injury that disrupts the blood supply to the leg and foot and injures the nerves and muscles (uncommon).  Shortening of the injured bones.  Hindrance of normal bone growth in children.  Risks of surgery: infection, bleeding, injury to nerves (numbness, weakness, paralysis), need for further surgery.  Longer healing time if activity is resumed too soon. TREATMENT Treatment first involves ice, medicine, and elevation of the leg to reduce pain and inflammation. People with fibular fractures are advised to walk using crutches. A brace or walking boot may be given to restrain the injured leg and allow for healing. Sometimes, surgery is needed to place a rod, plate, or screws in the bones in order to fix the fracture. After surgery, the leg is restrained. After restraint (with or without surgery), it is important to complete strengthening and stretching exercises to regain strength and a full range of motion. Exercises may be completed at home or with a therapist. MEDICATION   If pain medicine is needed, nonsteroidal anti-inflammatory medicines (aspirin and ibuprofen), or other minor pain relievers (acetaminophen), are often advised.  Do not take pain medicine for 7 days before surgery.  Prescription pain relievers may be given if your health care provider thinks they are needed. Use only as directed and only as much as you need. [place]  CARE IF:  Symptoms get worse or do not improve in 2 weeks, despite treatment.  The following occur after restraint or surgery. (Report any of these signs immediately):  Swelling  above or below the fracture site.  Severe, persistent pain.  Blue or gray skin below the fracture site, especially under the toenails. Numbness or loss of feeling below the fracture site.  New, unexplained symptoms develop. (Drugs used in treatment may produce side effects.) EXERCISES  RANGE OF MOTION (ROM) AND STRETCHING EXERCISES - Fibular Fracture These exercises may help you when beginning to recover from your injury. Your symptoms may go away with or without further involvement from your physician, physical therapist or athletic trainer. While completing these exercises, remember:   Restoring tissue flexibility helps normal motion to return to the joints. This allows healthier, less painful movement and activity.  An effective stretch should be held for at least 30 seconds.  A stretch should never be painful. You should only feel a gentle lengthening or release in the stretched tissue. RANGE OF MOTION - Dorsi/Plantar Flexion  While sitting with your right / left knee straight, draw the top of your foot upwards by flexing your ankle. Then reverse the motion, pointing your toes downward.  Hold each position for __________ seconds.  After completing your first set of exercises, repeat this exercise with your knee bent. Repeat __________ times. Complete this exercise __________ times per day.  STRETCH - Gastrocsoleus   Sit with your right / left leg extended. Holding onto both ends of a belt or towel, loop it around the ball of your foot.  Keeping your right / left ankle and foot relaxed and your knee straight, pull your foot and ankle toward you using the belt.  You should feel a gentle stretch behind your calf or knee. Hold this position for __________ seconds. Repeat __________ times. Complete this stretch __________ times per day.  RANGE OF MOTION- Ankle Plantar Flexion   Sit with your right / left leg crossed over your opposite knee.  Use your opposite hand to pull the top  of your foot and toes toward you.  You should feel a gentle stretch on the top of your foot and ankle. Hold this position for __________ seconds. Repeat __________ times. Complete __________ times per day.  RANGE OF MOTION - Ankle Eversion  Sit with your right / left ankle crossed over your opposite knee.  Grip your foot with your opposite hand, placing your thumb on the top of your foot and your fingers across the bottom of your foot.  Gently push your foot downward with a slight rotation so your littlest toes rise slightly toward the ceiling.  You should feel a gentle stretch on the inside of your ankle. Hold the stretch for __________ seconds. Repeat __________ times. Complete this exercise __________ times per day.  RANGE OF MOTION - Ankle Inversion  Sit with your right / left ankle crossed over your opposite knee.  Grip your foot with your opposite hand, placing your thumb on the bottom of your foot and your fingers across the top of your foot.  Gently pull your foot so the smallest toe comes toward you and your thumb pushes the inside of the ball of your foot away from you.  You should feel a gentle stretch on the outside of your ankle. Hold the stretch for __________ seconds. Repeat __________ times. Complete this exercise __________ times per day.  RANGE OF MOTION - Ankle Alphabet  Imagine your  right / left big toe is a pen.  Keeping your hip and knee still, write out the entire alphabet with your "pen." Make the letters as large as you can, without increasing any discomfort. Repeat __________ times. Complete this exercise __________ times per day.  RANGE OF MOTION - Ankle Dorsiflexion, Active Assisted  Remove your shoes and sit on a chair, preferably not on a carpeted surface.  Place your right / left foot on the floor, directly under your knee. Extend your opposite leg for support.  Keeping your heel down, slide your right / left foot back toward the chair, until you  feel a stretch at your ankle or calf. If you do not feel a stretch, slide your bottom forward to the edge of the chair, while still keeping your heel down.  Hold this stretch for __________ seconds. Repeat __________ times. Complete this stretch __________ times per day.  STRENGTHENING EXERCISES - Fibular Fracture These exercises may help you when beginning to recover from your injury. They may resolve your symptoms with or without further involvement from your physician, physical therapist or athletic trainer. While completing these exercises, remember:   Muscles can gain both the endurance and the strength needed for everyday activities through controlled exercises.  Complete these exercises as instructed by your physician, physical therapist or athletic trainer. Increase the resistance and repetitions only as guided.  You may experience muscle soreness or fatigue, but the pain or discomfort you are trying to eliminate should never worsen during these exercises. If this pain does get worse, stop and make certain you are following the directions exactly. If the pain is still present after adjustments, discontinue the exercise until you can discuss the trouble with your clinician. STRENGTH - Dorsiflexors  Secure a rubber exercise band or tubing to a fixed object (table, pole) and loop the other end around your right / left foot.  Sit on the floor, facing the fixed object. The band should be slightly tense when your foot is relaxed.  Slowly draw your foot back toward you, using your ankle and toes.  Hold this position for __________ seconds. Slowly release the tension in the band and return your foot to the starting position. Repeat __________ times. Complete this exercise __________ times per day.  STRENGTH - Plantar-flexors  Sit with your right / left leg extended. Holding onto both ends of a rubber exercise band or tubing, loop it around the ball of your foot. Keep a slight tension in the  band.  Slowly push your toes away from you, pointing them downward.  Hold this position for __________ seconds. Return to the starting position slowly, controlling the tension in the band. Repeat __________ times. Complete this exercise __________ times per day.  STRENGTH - Plantar-flexors, Standing   Stand with your feet shoulder width apart. Place your hands on a wall or table to steady yourself, using as little support as needed.  Keeping your weight evenly spread over the width of your feet, rise up on your toes.*  Hold this position for __________ seconds. Repeat __________ times. Complete this exercise __________ times per day.  *If this is too easy, shift your weight toward your right / left leg until you feel challenged. Ultimately, you may be asked to do this exercise while standing on your right / left foot only. STRENGTH - Towel Curls  Sit in a chair, on a non-carpeted surface.  Place your foot on a towel, keeping your heel on the floor.  Pull the  towel toward your heel only by curling your toes. Keep your heel on the floor.  If instructed by your physician, physical therapist or athletic trainer, add ____________________ at the end of the towel. Repeat __________ times. Complete this exercise __________ times per day. STRENGTH - Ankle Eversion  Secure one end of a rubber exercise band or tubing to a fixed object (table, pole). Loop the other end around your foot, just before your toes.  Place your fists between your knees. This will focus your strengthening at your ankle.  Drawing the band across your opposite foot, away from the pole, slowly pull your little toe out and up. Make sure the band is positioned to resist the entire motion.  Hold this position for __________ seconds.  Return to the starting position slowly, controlling the tension in the band. Repeat __________ times. Complete this exercise __________ times per day.  STRENGTH - Ankle Inversion  Secure  one end of a rubber exercise band or tubing to a fixed object (table, pole). Loop the other end around your foot, just before your toes.  Place your fists between your knees. This will focus your strengthening at your ankle.  Slowly, pull your big toe up and in, making sure the band is positioned to resist the entire motion.  Hold this position for __________ seconds.  Return to the starting position slowly, controlling the tension in the band. Repeat __________ times. Complete this exercises __________ times per day.    This information is not intended to replace advice given to you by your health care provider. Make sure you discuss any questions you have with your health care provider.   Document Released: 08/24/2005 Document Revised: 09/14/2014 Document Reviewed: 12/06/2008 Elsevier Interactive Patient Education 2016 [place] or Splint Care Casts and splints support injured limbs and keep bones from moving while they heal.  HOME CARE  Keep the cast or splint uncovered during the drying period.  A plaster cast can take 24 to 48 hours to dry.  A fiberglass cast will dry in less than 1 hour.  Do not rest the cast on anything harder than a pillow for 24 hours.  Do not put weight on your injured limb. Do not put pressure on the cast. Wait for your doctor's approval.  Keep the cast or splint dry.  Cover the cast or splint with a plastic bag during baths or wet weather.  If you have a cast over your chest and belly (trunk), take sponge baths until the cast is taken off.  If your cast gets wet, dry it with a towel or blow dryer. Use the cool setting on the blow dryer.  Keep your cast or splint clean. Wash a dirty cast with a damp cloth.  Do not put any objects under your cast or splint.  Do not scratch the skin under the cast with an object. If itching is a problem, use a blow dryer on a cool setting over the itchy area.  Do not trim or cut your cast.  Do not  take out the padding from inside your cast.  Exercise your joints near the cast as told by your doctor.  Raise (elevate) your injured limb on 1 or 2 pillows for the first 1 to 3 days. GET HELP IF:  Your cast or splint cracks.  Your cast or splint is too tight or too loose.  You itch badly under the cast.  Your cast gets wet or has a soft spot.  You have a bad smell coming from the cast.  You get an object stuck under the cast.  Your skin around the cast becomes red or sore.  You have new or more pain after the cast is put on. GET HELP RIGHT AWAY IF:  You have fluid leaking through the cast.  You cannot move your fingers or toes.  Your fingers or toes turn blue or white or are cool, painful, or puffy (swollen).  You have tingling or lose feeling (numbness) around the injured area.  You have bad pain or pressure under the cast.  You have trouble breathing or have shortness of breath.  You have chest pain.   This information is not intended to replace advice given to you by your health care provider. Make sure you discuss any questions you have with your health care provider.   Document Released: 12/24/2010 Document Revised: 04/26/2013 Document Reviewed: 03/02/2013 Elsevier Interactive Patient Education [employer].

## 2015-06-27 NOTE — ED Provider Notes (Signed)
CSN: [id number]     Arrival date & time 06/27/15  1019 History   First MD Initiated Contact with Patient 06/27/15 1033     Chief Complaint  Patient presents with  . Ankle Pain     (Consider location/radiation/quality/duration/timing/severity/associated sxs/prior Treatment) HPI   69 year old male presents for evaluation of left ankle injury. Patient reports 3 days ago he accidentally stepped on a shoe with his left foot and rolled his L ankle in the process. Developed acute onset of sharp throbbing moderate intensity pain to the lateral aspect of his left ankle worsening with ambulation and with movement. Pain has since radiate up to the lateral aspect of his L lower leg.  He has notice swelling to the affected area. He has tried ice and taking Tylenol without adequate relief. He denies any prior injury to the same ankle. Denies any foot pain or knee pain. Denies any precipitating symptoms prior to the injury. Has been able to ambulate.  Past Medical History  Diagnosis Date  . CHF (congestive heart failure) ([place])   . Hypertension   . Hyperlipidemia   . BPH (benign prostatic hyperplasia)   . CAD (coronary artery disease)   . Chronic cardiopulmonary disease ([place])   . Nicotine dependence   . Mass of lung   . COPD (chronic obstructive pulmonary disease) ([place])   . Shortness of breath dyspnea     with exertion  . Anxiety     situational  . Personal history of kidney stones   . Restless leg    Past Surgical History  Procedure Laterality Date  . Cad ccta 01/31/15    . Right inguinal hernia repair at age 51    . Colonoscopy    . Cervical fusion    . Back surgery    . Video bronchoscopy with endobronchial ultrasound N/A 02/27/2015    Procedure: VIDEO BRONCHOSCOPY WITH ENDOBRONCHIAL ULTRASOUND;  Surgeon: Grace Isaac, MD;  Location: [facility] OR;  Service: Thoracic;  Laterality: N/A;  . Lung biopsy N/A 02/27/2015    Procedure: LUNG BIOPSY;  Surgeon: Grace Isaac, MD;  Location: [facility] OR;   Service: Thoracic;  Laterality: N/A;   Family History  Problem Relation Age of Onset  . Cancer    . Heart disease Father   . Hypertension     Social History  Substance Use Topics  . Smoking status: Current Every Day Smoker -- 1.50 packs/day for 47 years    Types: Cigarettes    Start date: 02/14/1967  . Smokeless tobacco: Never Used  . Alcohol Use: 0.0 oz/week    0 Standard drinks or equivalent per week     Comment: ocassional    Review of Systems  Constitutional: Negative for fever.  Musculoskeletal: Positive for joint swelling.  Neurological: Negative for numbness.      Allergies  Review of patient's allergies indicates no known allergies.  Home Medications   Prior to Admission medications   Medication Sig Start Date End Date Taking? Authorizing Provider  amLODipine (NORVASC) 10 MG tablet Take 10 mg by mouth daily.  12/31/14   Historical Provider, MD  aspirin 81 MG tablet Take 81 mg by mouth daily.    Historical Provider, MD  atorvastatin (LIPITOR) 40 MG tablet Take 40 mg by mouth daily.  02/11/15   Historical Provider, MD  BYSTOLIC 20 MG TABS Take 20 mg by mouth daily.  02/11/15   Historical Provider, MD  cetirizine (ZYRTEC) 5 MG chewable tablet Chew 5 mg by mouth daily.  Historical Provider, MD  CHANTIX STARTING MONTH PAK 0.5 MG X 11 & 1 MG X 42 tablet Take 0.5-1 mg by mouth as directed.  02/13/15   Historical Provider, MD  Cholecalciferol (VITAMIN D3) 5000 UNITS TABS Take 5,000 Units by mouth daily.    Historical Provider, MD  finasteride (PROSCAR) 5 MG tablet Take 5 mg by mouth daily.  02/02/15   Historical Provider, MD  fluticasone Asencion Islam) 50 MCG/ACT nasal spray  04/02/15   Historical Provider, MD  losartan (COZAAR) 100 MG tablet Take 100 mg by mouth daily.  12/31/14   Historical Provider, MD  PROAIR HFA 108 (90 BASE) MCG/ACT inhaler Inhale 2 puffs into the lungs every 4 (four) hours as needed for wheezing or shortness of breath.  12/31/14   Historical Provider, MD   prochlorperazine (COMPAZINE) 10 MG tablet Take 1 tablet (10 mg total) by mouth every 6 (six) hours as needed for nausea or vomiting. Patient not taking: Reported on 06/06/2015 03/07/15   Curt Bears, MD  tamsulosin (FLOMAX) 0.4 MG CAPS capsule Take 0.4 mg by mouth daily.  02/02/15   Historical Provider, MD  Wound Cleansers (RADIAPLEX EX) Apply topically.    Historical Provider, MD   BP 117/69 mmHg  Pulse 72  Temp(Src) 99 F (37.2 C) (Oral)  Resp 18  SpO2 95% Physical Exam  Constitutional: He appears well-developed and well-nourished. No distress.  HENT:  Head: Atraumatic.  Eyes: Conjunctivae are normal.  Neck: Neck supple.  Musculoskeletal: He exhibits tenderness (Left ankle: Point tenderness lateral malleolus region without crepitus or obvious deformity. Moderate edema noted throughout ankle and dorsum of foot with faint bruising noted. Decrease ankle dorsiflexion and plantarflexion inversion and eversion 2/2 pain).  Left dorsalis pedis pulse intact with brisk cap refills to all toes. No pain at fifth metatarsal region on left foot.  Neurological: He is alert.  Skin: No rash noted.  Psychiatric: He has a normal mood and affect.  Nursing note and vitals reviewed.   ED Course  Procedures (including critical care time)  Patient says with a mechanical injury of his left ankle. X-ray ordered to rule out fractures or dislocation. Will apply ice to the affected area.  2:10 PM X-ray of left ankle demonstrate a essentially nondisplaced oblique fractures of the lateral malleolus. This is a closed injury. Patient will be placed in a stirrup splint. He has crutches at home which he will be using. Recommend nonweightbearing and to follow-up with orthopedist for further management. Patient is neurovascular intact. Pain medication prescribed.  Care discussed with Dr. Dayna Barker.    Labs Review Labs Reviewed - No data to display  Imaging Review Dg Ankle Complete Left  06/27/2015  CLINICAL  DATA:  Stepped on a shoe and twisted LEFT ankle, injury EXAM: LEFT ANKLE COMPLETE - 3+ VIEW COMPARISON:  None FINDINGS: Soft tissue swelling greatest laterally. Essentially nondisplaced oblique lateral malleolar fracture. Osseous mineralization normal. Ankle mortise intact. No additional fracture, dislocation, or bone destruction. IMPRESSION: Essentially nondisplaced oblique fracture of the lateral malleolus. Electronically Signed   By: Lavonia Dana M.D.   On: 06/27/2015 12:24   I have personally reviewed and evaluated these images and lab results as part of my medical decision-making.   EKG Interpretation None      MDM   Final diagnoses:  Closed left ankle fracture, initial encounter    BP 117/69 mmHg  Pulse 72  Temp(Src) 99 F (37.2 C) (Oral)  Resp 18  SpO2 95%     Domenic Moras,  PA-C 06/27/15 1425  Merrily Pew, MD 06/28/15 [phone]

## 2015-06-27 NOTE — ED Provider Notes (Signed)
Medical screening examination/treatment/procedure(s) were conducted as a shared visit with non-physician practitioner(s) and myself.  I personally evaluated the patient during the encounter.  69 yo M w/ l ankle pain after fall/rolling it. Exam with swelling, pain with palpation. NVI otherwise. No other evidence of trauma. xr w/ lateral mall fracture. Will splint, crutches, fu w/ pcp/ortho in 1 week.  Merrily Pew, MD 06/28/15 [phone]

## 2015-06-27 NOTE — ED Notes (Signed)
Per pt, states he tripped and injured left ankle on Tuesday-pain and swelling

## 2015-06-27 NOTE — ED Notes (Signed)
Made ortho aware of ankle splint

## 2015-06-30 NOTE — Progress Notes (Signed)
  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  Identity was confirmed.  All relevant records and images related to the planned course of therapy were reviewed.  The patient freely provided informed written consent to proceed with treatment after reviewing the details related to the planned course of therapy. The consent form was witnessed and verified by the simulation staff.  Then, the patient was set-up in a stable reproducible  supine position for radiation therapy.  CT images were obtained.  Surface markings were placed.  The CT images were loaded into the planning software.  Then the target and avoidance structures were contoured.  Treatment planning then occurred.  The radiation prescription was entered and confirmed.  Then, I designed and supervised the construction of a total of 6 medically necessary complex treatment devices, including a BodyFix immobilization mold custom fitted to the patient along with 5 multileaf collimators conformally shaped radiation around the treatment target while shielding critical structures such as the heart and spinal cord maximally.  I have requested : 3D Simulation  I have requested a DVH of the following structures: Left lung, right lung, spinal cord, heart, esophagus, and target.  I have ordered:Nutrition Consult  SPECIAL TREATMENT PROCEDURE:  The planned course of therapy using radiation constitutes a special treatment procedure. Special care is required in the management of this patient for the following reasons.  The patient will be receiving concurrent chemotherapy requiring careful monitoring for increased toxicities of treatment  including periodic laboratory values.  The special nature of the planned course of radiotherapy will require increased physician supervision and oversight to ensure patient's safety with optimal treatment outcomes.  PLAN:  The patient will receive 66 Gy in 33 fractions.  ________________________________  Sheral Apley Tammi Klippel, M.D.

## 2015-07-01 ENCOUNTER — Ambulatory Visit (HOSPITAL_BASED_OUTPATIENT_CLINIC_OR_DEPARTMENT_OTHER): Payer: Medicare Other

## 2015-07-01 ENCOUNTER — Other Ambulatory Visit: Payer: Medicare Other

## 2015-07-01 ENCOUNTER — Other Ambulatory Visit: Payer: Self-pay | Admitting: Internal Medicine

## 2015-07-01 VITALS — BP 103/57 | HR 63 | Temp 98.1°F | Resp 18

## 2015-07-01 DIAGNOSIS — C3411 Malignant neoplasm of upper lobe, right bronchus or lung: Secondary | ICD-10-CM | POA: Diagnosis present

## 2015-07-01 DIAGNOSIS — Z5111 Encounter for antineoplastic chemotherapy: Secondary | ICD-10-CM | POA: Diagnosis present

## 2015-07-01 LAB — CBC WITH DIFFERENTIAL/PLATELET
BASO%: 0.9 % (ref 0.0–2.0)
BASOS ABS: 0 10*3/uL (ref 0.0–0.1)
EOS ABS: 0 10*3/uL (ref 0.0–0.5)
EOS%: 1 % (ref 0.0–7.0)
HEMATOCRIT: 36 % — AB (ref 38.4–49.9)
HGB: 11.8 g/dL — ABNORMAL LOW (ref 13.0–17.1)
LYMPH#: 0.4 10*3/uL — AB (ref 0.9–3.3)
LYMPH%: 8.4 % — ABNORMAL LOW (ref 14.0–49.0)
MCH: 28.6 pg (ref 27.2–33.4)
MCHC: 32.8 g/dL (ref 32.0–36.0)
MCV: 87.3 fL (ref 79.3–98.0)
MONO#: 0.4 10*3/uL (ref 0.1–0.9)
MONO%: 9.4 % (ref 0.0–14.0)
NEUT#: 3.7 10*3/uL (ref 1.5–6.5)
NEUT%: 80.3 % — AB (ref 39.0–75.0)
PLATELETS: 312 10*3/uL (ref 140–400)
RBC: 4.12 10*6/uL — ABNORMAL LOW (ref 4.20–5.82)
RDW: 20.8 % — ABNORMAL HIGH (ref 11.0–14.6)
WBC: 4.7 10*3/uL (ref 4.0–10.3)

## 2015-07-01 LAB — COMPREHENSIVE METABOLIC PANEL (CC13)
ALBUMIN: 3.3 g/dL — AB (ref 3.5–5.0)
ALT: 10 U/L (ref 0–55)
ANION GAP: 9 meq/L (ref 3–11)
AST: 12 U/L (ref 5–34)
Alkaline Phosphatase: 104 U/L (ref 40–150)
BUN: 14.5 mg/dL (ref 7.0–26.0)
CALCIUM: 9.7 mg/dL (ref 8.4–10.4)
CHLORIDE: 102 meq/L (ref 98–109)
CO2: 27 mEq/L (ref 22–29)
CREATININE: 1.2 mg/dL (ref 0.7–1.3)
EGFR: 72 mL/min/{1.73_m2} — ABNORMAL LOW (ref >90)
Glucose: 132 mg/dl (ref 70–140)
POTASSIUM: 4.1 meq/L (ref 3.5–5.1)
Sodium: 138 mEq/L (ref 136–145)
Total Bilirubin: 0.57 mg/dL (ref 0.20–1.20)
Total Protein: 6.7 g/dL (ref 6.4–8.3)

## 2015-07-01 MED ORDER — SODIUM CHLORIDE 0.9 % IV SOLN
515.5000 mg | Freq: Once | INTRAVENOUS | Status: AC
  Administered 2015-07-01: 520 mg via INTRAVENOUS
  Filled 2015-07-01: qty 52

## 2015-07-01 MED ORDER — SODIUM CHLORIDE 0.9 % IV SOLN
Freq: Once | INTRAVENOUS | Status: AC
  Administered 2015-07-01: 10:00:00 via INTRAVENOUS
  Filled 2015-07-01: qty 8

## 2015-07-01 MED ORDER — DEXTROSE 5 % IV SOLN
175.0000 mg/m2 | Freq: Once | INTRAVENOUS | Status: AC
  Administered 2015-07-01: 372 mg via INTRAVENOUS
  Filled 2015-07-01: qty 62

## 2015-07-01 MED ORDER — DIPHENHYDRAMINE HCL 50 MG/ML IJ SOLN
INTRAMUSCULAR | Status: AC
  Filled 2015-07-01: qty 1

## 2015-07-01 MED ORDER — FAMOTIDINE IN NACL 20-0.9 MG/50ML-% IV SOLN
20.0000 mg | Freq: Once | INTRAVENOUS | Status: AC
  Administered 2015-07-01: 20 mg via INTRAVENOUS

## 2015-07-01 MED ORDER — DIPHENHYDRAMINE HCL 50 MG/ML IJ SOLN
50.0000 mg | Freq: Once | INTRAMUSCULAR | Status: AC
  Administered 2015-07-01: 25 mg via INTRAVENOUS

## 2015-07-01 MED ORDER — FAMOTIDINE IN NACL 20-0.9 MG/50ML-% IV SOLN
INTRAVENOUS | Status: AC
Start: 2015-07-01 — End: 2015-07-01
  Filled 2015-07-01: qty 50

## 2015-07-01 MED ORDER — SODIUM CHLORIDE 0.9 % IV SOLN
Freq: Once | INTRAVENOUS | Status: AC
  Administered 2015-07-01: 10:00:00 via INTRAVENOUS

## 2015-07-01 NOTE — Progress Notes (Signed)
Dr. Julien Nordmann notified of pt's recent ankle fracture, ankle splint in place. Scheduled to see ortho 10/28. OK to proceed with treatment today, per MD. Pt instructed to notify ortho of his chemo treatments, will need to coordinate with Dr. Julien Nordmann in case a surgical procedure will be needed.  Pt reports Benadryl gave him restless legs last treatment, discussed with Dr. Julien Nordmann: OK to decrease Benadryl to 25 mg.

## 2015-07-01 NOTE — Patient Instructions (Signed)
[facility] Discharge Instructions for Patients Receiving Chemotherapy  Today you received the following chemotherapy agents: Taxol and Carboplatin.  To help prevent nausea and vomiting after your treatment, we encourage you to take your nausea medication: Compazine. Take one every 6 hours as needed. If you develop nausea and vomiting that is not controlled by your nausea medication, call the clinic.   BELOW ARE SYMPTOMS THAT SHOULD BE REPORTED IMMEDIATELY:  *FEVER GREATER THAN 100.5 F  *CHILLS WITH OR WITHOUT FEVER  NAUSEA AND VOMITING THAT IS NOT CONTROLLED WITH YOUR NAUSEA MEDICATION  *UNUSUAL SHORTNESS OF BREATH  *UNUSUAL BRUISING OR BLEEDING  TENDERNESS IN MOUTH AND THROAT WITH OR WITHOUT PRESENCE OF ULCERS  *URINARY PROBLEMS  *BOWEL PROBLEMS  UNUSUAL RASH Items with * indicate a potential emergency and should be followed up as soon as possible.  Feel free to call the clinic should you have any questions or concerns. The clinic phone number is [phone].  Please show the CHEMO ALERT CARD at check-in to the Emergency Department and triage nurse.   

## 2015-07-02 ENCOUNTER — Ambulatory Visit (HOSPITAL_BASED_OUTPATIENT_CLINIC_OR_DEPARTMENT_OTHER): Payer: Medicare Other

## 2015-07-02 VITALS — BP 120/56 | HR 66 | Temp 98.2°F

## 2015-07-02 DIAGNOSIS — Z5189 Encounter for other specified aftercare: Secondary | ICD-10-CM | POA: Diagnosis not present

## 2015-07-02 DIAGNOSIS — C3411 Malignant neoplasm of upper lobe, right bronchus or lung: Secondary | ICD-10-CM

## 2015-07-02 MED ORDER — PEGFILGRASTIM INJECTION 6 MG/0.6ML ~~LOC~~
6.0000 mg | PREFILLED_SYRINGE | Freq: Once | SUBCUTANEOUS | Status: AC
  Administered 2015-07-02: 6 mg via SUBCUTANEOUS
  Filled 2015-07-02: qty 0.6

## 2015-07-08 ENCOUNTER — Ambulatory Visit (HOSPITAL_BASED_OUTPATIENT_CLINIC_OR_DEPARTMENT_OTHER): Payer: Medicare Other | Admitting: Internal Medicine

## 2015-07-08 ENCOUNTER — Telehealth: Payer: Self-pay | Admitting: Internal Medicine

## 2015-07-08 ENCOUNTER — Other Ambulatory Visit (HOSPITAL_BASED_OUTPATIENT_CLINIC_OR_DEPARTMENT_OTHER): Payer: Medicare Other

## 2015-07-08 ENCOUNTER — Encounter: Payer: Self-pay | Admitting: Internal Medicine

## 2015-07-08 VITALS — BP 86/39 | HR 66 | Temp 98.1°F | Resp 20 | Ht 68.0 in | Wt 206.3 lb

## 2015-07-08 DIAGNOSIS — C3411 Malignant neoplasm of upper lobe, right bronchus or lung: Secondary | ICD-10-CM

## 2015-07-08 DIAGNOSIS — C3491 Malignant neoplasm of unspecified part of right bronchus or lung: Secondary | ICD-10-CM

## 2015-07-08 DIAGNOSIS — Z5111 Encounter for antineoplastic chemotherapy: Secondary | ICD-10-CM

## 2015-07-08 LAB — COMPREHENSIVE METABOLIC PANEL (CC13)
ALT: 14 U/L (ref 0–55)
ANION GAP: 6 meq/L (ref 3–11)
AST: 14 U/L (ref 5–34)
Albumin: 3.4 g/dL — ABNORMAL LOW (ref 3.5–5.0)
Alkaline Phosphatase: 115 U/L (ref 40–150)
BILIRUBIN TOTAL: 0.43 mg/dL (ref 0.20–1.20)
BUN: 20.7 mg/dL (ref 7.0–26.0)
CALCIUM: 8.9 mg/dL (ref 8.4–10.4)
CHLORIDE: 104 meq/L (ref 98–109)
CO2: 27 mEq/L (ref 22–29)
CREATININE: 1.2 mg/dL (ref 0.7–1.3)
EGFR: 70 mL/min/{1.73_m2} — ABNORMAL LOW (ref >90)
Glucose: 104 mg/dl (ref 70–140)
Potassium: 4.3 mEq/L (ref 3.5–5.1)
Sodium: 137 mEq/L (ref 136–145)
Total Protein: 6.2 g/dL — ABNORMAL LOW (ref 6.4–8.3)

## 2015-07-08 LAB — CBC WITH DIFFERENTIAL/PLATELET
BASO%: 0.5 % (ref 0.0–2.0)
Basophils Absolute: 0 10*3/uL (ref 0.0–0.1)
EOS%: 1.9 % (ref 0.0–7.0)
Eosinophils Absolute: 0.2 10*3/uL (ref 0.0–0.5)
HCT: 32.3 % — ABNORMAL LOW (ref 38.4–49.9)
HEMOGLOBIN: 10.7 g/dL — AB (ref 13.0–17.1)
LYMPH%: 5.7 % — AB (ref 14.0–49.0)
MCH: 29.2 pg (ref 27.2–33.4)
MCHC: 33.2 g/dL (ref 32.0–36.0)
MCV: 88.1 fL (ref 79.3–98.0)
MONO#: 1.2 10*3/uL — ABNORMAL HIGH (ref 0.1–0.9)
MONO%: 11.8 % (ref 0.0–14.0)
NEUT%: 80.1 % — ABNORMAL HIGH (ref 39.0–75.0)
NEUTROS ABS: 8.3 10*3/uL — AB (ref 1.5–6.5)
Platelets: 126 10*3/uL — ABNORMAL LOW (ref 140–400)
RBC: 3.67 10*6/uL — ABNORMAL LOW (ref 4.20–5.82)
RDW: 18.9 % — AB (ref 11.0–14.6)
WBC: 10.4 10*3/uL — AB (ref 4.0–10.3)
lymph#: 0.6 10*3/uL — ABNORMAL LOW (ref 0.9–3.3)

## 2015-07-08 NOTE — Patient Instructions (Signed)
Smoking Cessation, Tips for Success If you are ready to quit smoking, congratulations! You have chosen to help yourself be healthier. Cigarettes bring nicotine, tar, carbon monoxide, and other irritants into your body. Your lungs, heart, and blood vessels will be able to work better without these poisons. There are many different ways to quit smoking. Nicotine gum, nicotine patches, a nicotine inhaler, or nicotine nasal spray can help with physical craving. Hypnosis, support groups, and medicines help break the habit of smoking. WHAT THINGS CAN I DO TO MAKE QUITTING EASIER?  Here are some tips to help you quit for good:  Pick a date when you will quit smoking completely. Tell all of your friends and family about your plan to quit on that date.  Do not try to slowly cut down on the number of cigarettes you are smoking. Pick a quit date and quit smoking completely starting on that day.  Throw away all cigarettes.   Clean and remove all ashtrays from your home, work, and car.  On a card, write down your reasons for quitting. Carry the card with you and read it when you get the urge to smoke.  Cleanse your body of nicotine. Drink enough water and fluids to keep your urine clear or pale yellow. Do this after quitting to flush the nicotine from your body.  Learn to predict your moods. Do not let a bad situation be your excuse to have a cigarette. Some situations in your life might tempt you into wanting a cigarette.  Never have "just one" cigarette. It leads to wanting another and another. Remind yourself of your decision to quit.  Change habits associated with smoking. If you smoked while driving or when feeling stressed, try other activities to replace smoking. Stand up when drinking your coffee. Brush your teeth after eating. Sit in a different chair when you read the paper. Avoid alcohol while trying to quit, and try to drink fewer caffeinated beverages. Alcohol and caffeine may urge you to  smoke.  Avoid foods and drinks that can trigger a desire to smoke, such as sugary or spicy foods and alcohol.  Ask people who smoke not to smoke around you.  Have something planned to do right after eating or having a cup of coffee. For example, plan to take a walk or exercise.  Try a relaxation exercise to calm you down and decrease your stress. Remember, you may be tense and nervous for the first 2 weeks after you quit, but this will pass.  Find new activities to keep your hands busy. Play with a pen, coin, or rubber band. Doodle or draw things on paper.  Brush your teeth right after eating. This will help cut down on the craving for the taste of tobacco after meals. You can also try mouthwash.   Use oral substitutes in place of cigarettes. Try using lemon drops, carrots, cinnamon sticks, or chewing gum. Keep them handy so they are available when you have the urge to smoke.  When you have the urge to smoke, try deep breathing.  Designate your home as a nonsmoking area.  If you are a heavy smoker, ask your health care provider about a prescription for nicotine chewing gum. It can ease your withdrawal from nicotine.  Reward yourself. Set aside the cigarette money you save and buy yourself something nice.  Look for support from others. Join a support group or smoking cessation program. Ask someone at home or at work to help you with your plan   to quit smoking.  Always ask yourself, "Do I need this cigarette or is this just a reflex?" Tell yourself, "Today, I choose not to smoke," or "I do not want to smoke." You are reminding yourself of your decision to quit.  Do not replace cigarette smoking with electronic cigarettes (commonly called e-cigarettes). The safety of e-cigarettes is unknown, and some may contain harmful chemicals.  If you relapse, do not give up! Plan ahead and think about what you will do the next time you get the urge to smoke. HOW WILL I FEEL WHEN I QUIT SMOKING? You  may have symptoms of withdrawal because your body is used to nicotine (the addictive substance in cigarettes). You may crave cigarettes, be irritable, feel very hungry, cough often, get headaches, or have difficulty concentrating. The withdrawal symptoms are only temporary. They are strongest when you first quit but will go away within 10-14 days. When withdrawal symptoms occur, stay in control. Think about your reasons for quitting. Remind yourself that these are signs that your body is healing and getting used to being without cigarettes. Remember that withdrawal symptoms are easier to treat than the major diseases that smoking can cause.  Even after the withdrawal is over, expect periodic urges to smoke. However, these cravings are generally short lived and will go away whether you smoke or not. Do not smoke! WHAT RESOURCES ARE AVAILABLE TO HELP ME QUIT SMOKING? Your health care provider can direct you to community resources or hospitals for support, which may include:  Group support.  Education.  Hypnosis.  Therapy.   This information is not intended to replace advice given to you by your health care provider. Make sure you discuss any questions you have with your health care provider.   Document Released: 05/22/2004 Document Revised: 09/14/2014 Document Reviewed: 02/09/2013 Elsevier Interactive Patient Education 2016 [employer]  

## 2015-07-08 NOTE — Telephone Encounter (Signed)
Gave patient avs report and appointments for November and December  °

## 2015-07-08 NOTE — Progress Notes (Signed)
[facility] [phone]   [phone]  OFFICE PROGRESS NOTE  [name], [place] [address]  DIAGNOSIS: Stage IIIA (T3, N2, M0) non-small cell lung cancer, squamous cell carcinoma diagnosed in June 2016.  PRIOR THERAPY: Concurrent chemoradiation with weekly carboplatin for an AUC of 2 and paclitaxel 45 mg/m2 with partial response.   CURRENT THERAPY: Consolidation systemic chemotherapy with carboplatin for AUC of 5 and paclitaxel 175 MG/M2 every 3 weeks. First cycle 06/10/2015. Status post 2 cycles.  INTERVAL HISTORY: Colton Nguyen 69 y.o. male returns to the clinic today for follow-up visit accompanied by his wife. The patient is feeling fine today with no specific complaints except for recent fracture of his left ankle. He was seen by Dr. Percell Miller and expected to have surgical intervention later this week. He denied having any significant nausea or vomiting, no fever or chills. He denied having any significant weight loss or night sweats. He has no dysphagia or odynophagia. The patient denied having any significant chest pain, shortness of breath, cough or hemoptysis.   MEDICAL HISTORY: Past Medical History  Diagnosis Date  . CHF (congestive heart failure) ([place])   . Hypertension   . Hyperlipidemia   . BPH (benign prostatic hyperplasia)   . CAD (coronary artery disease)   . Chronic cardiopulmonary disease ([place])   . Nicotine dependence   . Mass of lung   . COPD (chronic obstructive pulmonary disease) ([place])   . Shortness of breath dyspnea     with exertion  . Anxiety     situational  . Personal history of kidney stones   . Restless leg     ALLERGIES:  has No Known Allergies.  MEDICATIONS:  Current Outpatient Prescriptions  Medication Sig Dispense Refill  . acetaminophen (TYLENOL) 325 MG tablet Take 650 mg by mouth every 6 (six) hours as needed.    Marland Kitchen amLODipine (NORVASC) 10 MG tablet Take 10 mg by mouth  daily.     Marland Kitchen atorvastatin (LIPITOR) 40 MG tablet Take 40 mg by mouth daily.     Marland Kitchen BYSTOLIC 20 MG TABS Take 20 mg by mouth daily.     . cetirizine (ZYRTEC) 5 MG chewable tablet Chew 5 mg by mouth daily.    . Cholecalciferol (VITAMIN D3) 5000 UNITS TABS Take 5,000 Units by mouth daily.    . finasteride (PROSCAR) 5 MG tablet Take 5 mg by mouth daily.     . fluticasone (FLONASE) 50 MCG/ACT nasal spray     . losartan (COZAAR) 100 MG tablet Take 100 mg by mouth daily.     . tamsulosin (FLOMAX) 0.4 MG CAPS capsule Take 0.4 mg by mouth daily.     . Wound Cleansers (RADIAPLEX EX) Apply topically.    Marland Kitchen aspirin 81 MG tablet Take 81 mg by mouth daily.    . CHANTIX STARTING MONTH PAK 0.5 MG X 11 & 1 MG X 42 tablet Take 0.5-1 mg by mouth as directed.     Marland Kitchen PROAIR HFA 108 (90 BASE) MCG/ACT inhaler Inhale 2 puffs into the lungs every 4 (four) hours as needed for wheezing or shortness of breath.     . prochlorperazine (COMPAZINE) 10 MG tablet Take 1 tablet (10 mg total) by mouth every 6 (six) hours as needed for nausea or vomiting. (Patient not taking: Reported on 06/06/2015) 30 tablet 0  . traMADol (ULTRAM) 50 MG tablet Take 1 tablet (50 mg total) by mouth every 6 (  six) hours as needed. (Patient not taking: Reported on 07/08/2015) 15 tablet 0   No current facility-administered medications for this visit.    SURGICAL HISTORY:  Past Surgical History  Procedure Laterality Date  . Cad ccta 01/31/15    . Right inguinal hernia repair at age 51    . Colonoscopy    . Cervical fusion    . Back surgery    . Video bronchoscopy with endobronchial ultrasound N/A 02/27/2015    Procedure: VIDEO BRONCHOSCOPY WITH ENDOBRONCHIAL ULTRASOUND;  Surgeon: Grace Isaac, MD;  Location: [facility];  Service: Thoracic;  Laterality: N/A;  . Lung biopsy N/A 02/27/2015    Procedure: LUNG BIOPSY;  Surgeon: Grace Isaac, MD;  Location: [facility];  Service: Thoracic;  Laterality: N/A;    REVIEW OF SYSTEMS:  A comprehensive review of  systems was negative except for: Constitutional: positive for fatigue Respiratory: positive for dyspnea on exertion Musculoskeletal: positive for bone pain   PHYSICAL EXAMINATION: General appearance: alert, cooperative and no distress Head: Normocephalic, without obvious abnormality, atraumatic Neck: no adenopathy, no JVD, supple, symmetrical, trachea midline and thyroid not enlarged, symmetric, no tenderness/mass/nodules Lymph nodes: Cervical, supraclavicular, and axillary nodes normal. Resp: clear to auscultation bilaterally Back: symmetric, no curvature. ROM normal. No CVA tenderness. Cardio: regular rate and rhythm, S1, S2 normal, no murmur, click, rub or gallop GI: soft, non-tender; bowel sounds normal; no masses,  no organomegaly Extremities: extremities normal, atraumatic, no cyanosis or edema Neurologic: Alert and oriented X 3, normal strength and tone. Normal symmetric reflexes. Normal coordination and gait  ECOG PERFORMANCE STATUS: 2 - Symptomatic, <50% confined to bed  Blood pressure 86/39, pulse 66, temperature 98.1 F (36.7 C), temperature source Oral, resp. rate 20, height '5\' 8"'$  (1.727 m), weight 206 lb 4.8 oz (93.577 kg), SpO2 99 %.  LABORATORY DATA: Lab Results  Component Value Date   WBC 10.4* 07/08/2015   HGB 10.7* 07/08/2015   HCT 32.3* 07/08/2015   MCV 88.1 07/08/2015   PLT 126* 07/08/2015      Chemistry      Component Value Date/Time   NA 137 07/08/2015 1137   NA 136 02/27/2015 0701   K 4.3 07/08/2015 1137   K 4.3 02/27/2015 0701   CL 102 02/27/2015 0701   CO2 27 07/08/2015 1137   CO2 22 02/27/2015 0701   BUN 20.7 07/08/2015 1137   BUN 11 02/27/2015 0701   CREATININE 1.2 07/08/2015 1137   CREATININE 1.07 02/27/2015 0701      Component Value Date/Time   CALCIUM 8.9 07/08/2015 1137   CALCIUM 8.7* 02/27/2015 0701   ALKPHOS 115 07/08/2015 1137   ALKPHOS 103 02/27/2015 0701   AST 14 07/08/2015 1137   AST 28 02/27/2015 0701   ALT 14 07/08/2015 1137     ALT 17 02/27/2015 0701   BILITOT 0.43 07/08/2015 1137   BILITOT 0.7 02/27/2015 0701       RADIOGRAPHIC STUDIES: Dg Ankle Complete Left  07-05-2015  CLINICAL DATA:  Stepped on a shoe and twisted LEFT ankle, injury EXAM: LEFT ANKLE COMPLETE - 3+ VIEW COMPARISON:  None FINDINGS: Soft tissue swelling greatest laterally. Essentially nondisplaced oblique lateral malleolar fracture. Osseous mineralization normal. Ankle mortise intact. No additional fracture, dislocation, or bone destruction. IMPRESSION: Essentially nondisplaced oblique fracture of the lateral malleolus. Electronically Signed   By: Lavonia Dana M.D.   On: Jul 05, 2015 12:24    ASSESSMENT AND PLAN: This is a very pleasant 69 years old African-American male with a stage  IIIa non-small cell lung cancer. The patient completed a course of concurrent chemoradiation with excellent response to his treatment. He is currently undergoing consolidation chemotherapy with reduced dose carboplatin and paclitaxel is status post 2 cycles and has been to rating his treatment fairly well with no significant adverse effects. He has recent fracture of the left ankle and I don't see any contraindication from the oncology standpoint for him to proceed with the surgical intervention but the patient may need clearance from his primary care physician regarding cardiac and pulmonary function. He would come back for follow-up visit in 2 weeks with the start of cycle #3. He was advised to call immediately if he has any concerning symptoms in the interval. The patient voices understanding of current disease status and treatment options and is in agreement with the current care plan.  All questions were answered. The patient knows to call the clinic with any problems, questions or concerns. We can certainly see the patient much sooner if necessary.  Disclaimer: This note was dictated with voice recognition software. Similar sounding words can inadvertently be  transcribed and may not be corrected upon review.

## 2015-07-15 ENCOUNTER — Other Ambulatory Visit (HOSPITAL_BASED_OUTPATIENT_CLINIC_OR_DEPARTMENT_OTHER): Payer: Medicare Other

## 2015-07-15 DIAGNOSIS — C3411 Malignant neoplasm of upper lobe, right bronchus or lung: Secondary | ICD-10-CM

## 2015-07-15 LAB — CBC WITH DIFFERENTIAL/PLATELET
BASO%: 0.4 % (ref 0.0–2.0)
Basophils Absolute: 0 10*3/uL (ref 0.0–0.1)
EOS%: 2 % (ref 0.0–7.0)
Eosinophils Absolute: 0.1 10*3/uL (ref 0.0–0.5)
HEMATOCRIT: 32.7 % — AB (ref 38.4–49.9)
HGB: 10.7 g/dL — ABNORMAL LOW (ref 13.0–17.1)
LYMPH#: 0.5 10*3/uL — AB (ref 0.9–3.3)
LYMPH%: 9.4 % — ABNORMAL LOW (ref 14.0–49.0)
MCH: 29.3 pg (ref 27.2–33.4)
MCHC: 32.7 g/dL (ref 32.0–36.0)
MCV: 89.6 fL (ref 79.3–98.0)
MONO#: 0.5 10*3/uL (ref 0.1–0.9)
MONO%: 10 % (ref 0.0–14.0)
NEUT%: 78.2 % — AB (ref 39.0–75.0)
NEUTROS ABS: 4 10*3/uL (ref 1.5–6.5)
PLATELETS: 158 10*3/uL (ref 140–400)
RBC: 3.65 10*6/uL — AB (ref 4.20–5.82)
RDW: 17.7 % — ABNORMAL HIGH (ref 11.0–14.6)
WBC: 5.1 10*3/uL (ref 4.0–10.3)

## 2015-07-15 LAB — COMPREHENSIVE METABOLIC PANEL (CC13)
ALT: 12 U/L (ref 0–55)
AST: 15 U/L (ref 5–34)
Albumin: 3.1 g/dL — ABNORMAL LOW (ref 3.5–5.0)
Alkaline Phosphatase: 97 U/L (ref 40–150)
Anion Gap: 8 mEq/L (ref 3–11)
BILIRUBIN TOTAL: 0.36 mg/dL (ref 0.20–1.20)
BUN: 13.9 mg/dL (ref 7.0–26.0)
CALCIUM: 9.1 mg/dL (ref 8.4–10.4)
CHLORIDE: 104 meq/L (ref 98–109)
CO2: 26 meq/L (ref 22–29)
CREATININE: 1.1 mg/dL (ref 0.7–1.3)
EGFR: 79 mL/min/{1.73_m2} — ABNORMAL LOW (ref >90)
Glucose: 104 mg/dl (ref 70–140)
Potassium: 4.3 mEq/L (ref 3.5–5.1)
Sodium: 138 mEq/L (ref 136–145)
TOTAL PROTEIN: 6.3 g/dL — AB (ref 6.4–8.3)

## 2015-07-16 ENCOUNTER — Telehealth: Payer: Self-pay | Admitting: *Deleted

## 2015-07-16 NOTE — Telephone Encounter (Signed)
Ok to receive flu shot.

## 2015-07-16 NOTE — Telephone Encounter (Signed)
TC from patient asking if it is okay for him to get a flu shot.  Please advise.

## 2015-07-17 NOTE — Telephone Encounter (Signed)
Notified pt Ok to have flu shot.  Pt verbalized understanding no further concerns.

## 2015-07-22 ENCOUNTER — Ambulatory Visit (HOSPITAL_BASED_OUTPATIENT_CLINIC_OR_DEPARTMENT_OTHER): Payer: Medicare Other

## 2015-07-22 ENCOUNTER — Encounter: Payer: Self-pay | Admitting: Oncology

## 2015-07-22 ENCOUNTER — Ambulatory Visit (HOSPITAL_BASED_OUTPATIENT_CLINIC_OR_DEPARTMENT_OTHER): Payer: Medicare Other | Admitting: Oncology

## 2015-07-22 ENCOUNTER — Other Ambulatory Visit (HOSPITAL_BASED_OUTPATIENT_CLINIC_OR_DEPARTMENT_OTHER): Payer: Medicare Other

## 2015-07-22 VITALS — BP 115/60 | HR 72 | Temp 98.5°F | Resp 18 | Ht 68.0 in | Wt 204.8 lb

## 2015-07-22 DIAGNOSIS — Z5111 Encounter for antineoplastic chemotherapy: Secondary | ICD-10-CM | POA: Diagnosis present

## 2015-07-22 DIAGNOSIS — C3411 Malignant neoplasm of upper lobe, right bronchus or lung: Secondary | ICD-10-CM | POA: Diagnosis present

## 2015-07-22 LAB — CBC WITH DIFFERENTIAL/PLATELET
BASO%: 0.8 % (ref 0.0–2.0)
Basophils Absolute: 0 10*3/uL (ref 0.0–0.1)
EOS ABS: 0.1 10*3/uL (ref 0.0–0.5)
EOS%: 1.6 % (ref 0.0–7.0)
HEMATOCRIT: 33.3 % — AB (ref 38.4–49.9)
HEMOGLOBIN: 11.1 g/dL — AB (ref 13.0–17.1)
LYMPH#: 0.7 10*3/uL — AB (ref 0.9–3.3)
LYMPH%: 19.2 % (ref 14.0–49.0)
MCH: 29.9 pg (ref 27.2–33.4)
MCHC: 33.3 g/dL (ref 32.0–36.0)
MCV: 89.8 fL (ref 79.3–98.0)
MONO#: 0.4 10*3/uL (ref 0.1–0.9)
MONO%: 11.3 % (ref 0.0–14.0)
NEUT%: 67.1 % (ref 39.0–75.0)
NEUTROS ABS: 2.4 10*3/uL (ref 1.5–6.5)
Platelets: 207 10*3/uL (ref 140–400)
RBC: 3.71 10*6/uL — ABNORMAL LOW (ref 4.20–5.82)
RDW: 17.4 % — AB (ref 11.0–14.6)
WBC: 3.6 10*3/uL — AB (ref 4.0–10.3)

## 2015-07-22 LAB — COMPREHENSIVE METABOLIC PANEL (CC13)
ALBUMIN: 3.3 g/dL — AB (ref 3.5–5.0)
ALK PHOS: 90 U/L (ref 40–150)
ALT: 14 U/L (ref 0–55)
AST: 16 U/L (ref 5–34)
Anion Gap: 9 mEq/L (ref 3–11)
BUN: 15.3 mg/dL (ref 7.0–26.0)
CO2: 24 mEq/L (ref 22–29)
CREATININE: 1.1 mg/dL (ref 0.7–1.3)
Calcium: 9 mg/dL (ref 8.4–10.4)
Chloride: 105 mEq/L (ref 98–109)
EGFR: 83 mL/min/{1.73_m2} — ABNORMAL LOW (ref >90)
GLUCOSE: 100 mg/dL (ref 70–140)
Potassium: 4.1 mEq/L (ref 3.5–5.1)
SODIUM: 138 meq/L (ref 136–145)
TOTAL PROTEIN: 6.3 g/dL — AB (ref 6.4–8.3)

## 2015-07-22 MED ORDER — FAMOTIDINE IN NACL 20-0.9 MG/50ML-% IV SOLN
20.0000 mg | Freq: Once | INTRAVENOUS | Status: AC
  Administered 2015-07-22: 20 mg via INTRAVENOUS

## 2015-07-22 MED ORDER — DIPHENHYDRAMINE HCL 50 MG/ML IJ SOLN
50.0000 mg | Freq: Once | INTRAMUSCULAR | Status: AC
  Administered 2015-07-22: 50 mg via INTRAVENOUS

## 2015-07-22 MED ORDER — FAMOTIDINE IN NACL 20-0.9 MG/50ML-% IV SOLN
INTRAVENOUS | Status: AC
  Filled 2015-07-22: qty 50

## 2015-07-22 MED ORDER — PACLITAXEL CHEMO INJECTION 300 MG/50ML
175.0000 mg/m2 | Freq: Once | INTRAVENOUS | Status: AC
  Administered 2015-07-22: 372 mg via INTRAVENOUS
  Filled 2015-07-22: qty 62

## 2015-07-22 MED ORDER — SODIUM CHLORIDE 0.9 % IV SOLN
Freq: Once | INTRAVENOUS | Status: AC
  Administered 2015-07-22: 12:00:00 via INTRAVENOUS

## 2015-07-22 MED ORDER — SODIUM CHLORIDE 0.9 % IV SOLN
520.0000 mg | Freq: Once | INTRAVENOUS | Status: AC
  Administered 2015-07-22: 520 mg via INTRAVENOUS
  Filled 2015-07-22: qty 52

## 2015-07-22 MED ORDER — DIPHENHYDRAMINE HCL 50 MG/ML IJ SOLN
INTRAMUSCULAR | Status: AC
Start: 2015-07-22 — End: 2015-07-22
  Filled 2015-07-22: qty 1

## 2015-07-22 MED ORDER — SODIUM CHLORIDE 0.9 % IV SOLN
Freq: Once | INTRAVENOUS | Status: AC
  Administered 2015-07-22: 12:00:00 via INTRAVENOUS
  Filled 2015-07-22: qty 8

## 2015-07-22 NOTE — Patient Instructions (Signed)
[facility] Discharge Instructions for Patients Receiving Chemotherapy  Today you received the following chemotherapy agents Taxol and Carboplatin. To help prevent nausea and vomiting after your treatment, we encourage you to take your nausea medication as directed.  If you develop nausea and vomiting that is not controlled by your nausea medication, call the clinic.   BELOW ARE SYMPTOMS THAT SHOULD BE REPORTED IMMEDIATELY:  *FEVER GREATER THAN 100.5 F  *CHILLS WITH OR WITHOUT FEVER  NAUSEA AND VOMITING THAT IS NOT CONTROLLED WITH YOUR NAUSEA MEDICATION  *UNUSUAL SHORTNESS OF BREATH  *UNUSUAL BRUISING OR BLEEDING  TENDERNESS IN MOUTH AND THROAT WITH OR WITHOUT PRESENCE OF ULCERS  *URINARY PROBLEMS  *BOWEL PROBLEMS  UNUSUAL RASH Items with * indicate a potential emergency and should be followed up as soon as possible.  Feel free to call the clinic you have any questions or concerns. The clinic phone number is [phone].  Please show the CHEMO ALERT CARD at check-in to the Emergency Department and triage nurse.    

## 2015-07-22 NOTE — Progress Notes (Signed)
[name] [phone]   [phone]  OFFICE PROGRESS NOTE  [name], [name]  DIAGNOSIS: Stage IIIA (T3, N2, M0) non-small cell lung cancer, squamous cell carcinoma diagnosed in June 2016.  PRIOR THERAPY: Concurrent chemoradiation with weekly carboplatin for an AUC of 2 and paclitaxel 45 mg/m2 with partial response.   CURRENT THERAPY: Consolidation systemic chemotherapy with carboplatin for AUC of 5 and paclitaxel 175 MG/M2 every 3 weeks. First cycle 06/10/2015. Status post 2 cycles.  INTERVAL HISTORY: Colton Nguyen 69 y.o. male returns to the clinic today for follow-up visit accompanied by his wife. The patient is feeling fine today with no specific complaints. Had recent surgery to his left ankle due to a fracture. Sees Ortho for follow-up later this week. He denied having any significant nausea or vomiting, no fever or chills. He denied having any significant weight loss or night sweats. He has no dysphagia or odynophagia. The patient denied having any significant chest pain, shortness of breath, cough or hemoptysis.   MEDICAL HISTORY: Past Medical History  Diagnosis Date  . CHF (congestive heart failure) ([place])   . Hypertension   . Hyperlipidemia   . BPH (benign prostatic hyperplasia)   . CAD (coronary artery disease)   . Chronic cardiopulmonary disease ([place])   . Nicotine dependence   . Mass of lung   . COPD (chronic obstructive pulmonary disease) ([place])   . Shortness of breath dyspnea     with exertion  . Anxiety     situational  . Personal history of kidney stones   . Restless leg     ALLERGIES:  has No Known Allergies.  MEDICATIONS:  Current Outpatient Prescriptions  Medication Sig Dispense Refill  . acetaminophen (TYLENOL) 325 MG tablet Take 650 mg by mouth every 6 (six) hours as needed.    Marland Kitchen amLODipine (NORVASC) 5 MG tablet Take 5 mg by mouth daily.    Marland Kitchen aspirin 81 MG tablet Take  81 mg by mouth daily.    Marland Kitchen atorvastatin (LIPITOR) 40 MG tablet Take 20 mg by mouth daily.    Marland Kitchen BYSTOLIC 20 MG TABS Take 20 mg by mouth daily.    . cetirizine (ZYRTEC) 5 MG chewable tablet Chew 5 mg by mouth daily.    . CHANTIX STARTING MONTH PAK 0.5 MG X 11 & 1 MG X 42 tablet Take 0.5-1 mg by mouth as directed.     . Cholecalciferol (VITAMIN D3) 5000 UNITS TABS Take 5,000 Units by mouth daily.    . finasteride (PROSCAR) 5 MG tablet Take 5 mg by mouth daily.     . fluticasone (FLONASE) 50 MCG/ACT nasal spray     . losartan (COZAAR) 50 MG tablet Take 50 mg by mouth daily.    . ondansetron (ZOFRAN) 4 MG tablet Take 4 mg by mouth as directed.    Marland Kitchen oxyCODONE-acetaminophen (PERCOCET/ROXICET) 5-325 MG tablet Take 1 tablet by mouth as directed.    Marland Kitchen PROAIR HFA 108 (90 BASE) MCG/ACT inhaler Inhale 2 puffs into the lungs every 4 (four) hours as needed for wheezing or shortness of breath.     . prochlorperazine (COMPAZINE) 10 MG tablet Take 1 tablet (10 mg total) by mouth every 6 (six) hours as needed for nausea or vomiting. 30 tablet 0  . tamsulosin (FLOMAX) 0.4 MG CAPS capsule Take 0.4 mg by mouth daily.     . Wound Cleansers (RADIAPLEX EX) Apply topically.  No current facility-administered medications for this visit.   Facility-Administered Medications Ordered in Other Visits  Medication Dose Route Frequency Provider Last Rate Last Dose  . CARBOplatin (PARAPLATIN) 520 mg in sodium chloride 0.9 % 250 mL chemo infusion  520 mg Intravenous Once Curt Bears, MD      . PACLitaxel (TAXOL) 372 mg in dextrose 5 % 500 mL chemo infusion (> '80mg'$ /m2)  175 mg/m2 (Treatment Plan Actual) Intravenous Once Curt Bears, MD 187 mL/hr at 07/22/15 1314 372 mg at 07/22/15 1314    SURGICAL HISTORY:  Past Surgical History  Procedure Laterality Date  . Cad ccta 01/31/15    . Right inguinal hernia repair at age 62    . Colonoscopy    . Cervical fusion    . Back surgery    . Video bronchoscopy with  endobronchial ultrasound N/A 02/27/2015    Procedure: VIDEO BRONCHOSCOPY WITH ENDOBRONCHIAL ULTRASOUND;  Surgeon: Grace Isaac, MD;  Location: [facility];  Service: Thoracic;  Laterality: N/A;  . Lung biopsy N/A 02/27/2015    Procedure: LUNG BIOPSY;  Surgeon: Grace Isaac, MD;  Location: [facility];  Service: Thoracic;  Laterality: N/A;    REVIEW OF SYSTEMS:  A comprehensive review of systems was negative except for: Constitutional: positive for fatigue Respiratory: positive for dyspnea on exertion Musculoskeletal: positive for bone pain   PHYSICAL EXAMINATION: General appearance: alert, cooperative and no distress Head: Normocephalic, without obvious abnormality, atraumatic Neck: no adenopathy, no JVD, supple, symmetrical, trachea midline and thyroid not enlarged, symmetric, no tenderness/mass/nodules Lymph nodes: Cervical, supraclavicular, and axillary nodes normal. Resp: clear to auscultation bilaterally Back: symmetric, no curvature. ROM normal. No CVA tenderness. Cardio: regular rate and rhythm, S1, S2 normal, no murmur, click, rub or gallop GI: soft, non-tender; bowel sounds normal; no masses,  no organomegaly Extremities: extremities normal, atraumatic, no cyanosis or edema Neurologic: Alert and oriented X 3, normal strength and tone. Normal symmetric reflexes. Normal coordination and gait Left leg wrapped with an ACE bandage due to recent surgery.   ECOG PERFORMANCE STATUS: 2 - Symptomatic, <50% confined to bed  Blood pressure 115/60, pulse 72, temperature 98.5 F (36.9 C), temperature source Oral, resp. rate 18, height '5\' 8"'$  (1.727 m), weight 204 lb 12.8 oz (92.897 kg), SpO2 98 %.  LABORATORY DATA: Lab Results  Component Value Date   WBC 3.6* 07/22/2015   HGB 11.1* 07/22/2015   HCT 33.3* 07/22/2015   MCV 89.8 07/22/2015   PLT 207 07/22/2015      Chemistry      Component Value Date/Time   NA 138 07/22/2015 1103   NA 136 02/27/2015 0701   K 4.1 07/22/2015 1103   K 4.3  02/27/2015 0701   CL 102 02/27/2015 0701   CO2 24 07/22/2015 1103   CO2 22 02/27/2015 0701   BUN 15.3 07/22/2015 1103   BUN 11 02/27/2015 0701   CREATININE 1.1 07/22/2015 1103   CREATININE 1.07 02/27/2015 0701      Component Value Date/Time   CALCIUM 9.0 07/22/2015 1103   CALCIUM 8.7* 02/27/2015 0701   ALKPHOS 90 07/22/2015 1103   ALKPHOS 103 02/27/2015 0701   AST 16 07/22/2015 1103   AST 28 02/27/2015 0701   ALT 14 07/22/2015 1103   ALT 17 02/27/2015 0701   BILITOT <0.30 07/22/2015 1103   BILITOT 0.7 02/27/2015 0701       RADIOGRAPHIC STUDIES: Dg Ankle Complete Left  2015-06-28  CLINICAL DATA:  Stepped on a shoe and twisted LEFT ankle,  injury EXAM: LEFT ANKLE COMPLETE - 3+ VIEW COMPARISON:  None FINDINGS: Soft tissue swelling greatest laterally. Essentially nondisplaced oblique lateral malleolar fracture. Osseous mineralization normal. Ankle mortise intact. No additional fracture, dislocation, or bone destruction. IMPRESSION: Essentially nondisplaced oblique fracture of the lateral malleolus. Electronically Signed   By: Lavonia Dana M.D.   On: 06/27/2015 12:24    ASSESSMENT AND PLAN: This is a very pleasant 69 year old African-American male with a stage IIIa non-small cell lung cancer. The patient completed a course of concurrent chemoradiation with excellent response to his treatment. He is currently undergoing consolidation chemotherapy with reduced dose carboplatin and paclitaxel is status post 2 cycles and has been to rating his treatment fairly well with no significant adverse effects.  Patient seen with Dr. Julien Nordmann. Recommend that he proceed with cycle 3 of his chemo today as scheduled. He will have a restaging CT scan after this cycle of chemo.  He will come back for follow-up visit in 3 weeks to review the results of his CT scan.  He was advised to call immediately if he has any concerning symptoms in the interval. The patient voices understanding of current disease  status and treatment options and is in agreement with the current care plan.  All questions were answered. The patient knows to call the clinic with any problems, questions or concerns. We can certainly see the patient much sooner if necessary.   Colton Bussing, DNP, AGPCNP-BC, AOCNP    ADDENDUM: Hematology/Oncology Attending: I had a face to face encounter with the patient today. I recommended his care plan. This is a very pleasant 69 years old African-American male with a stage IIIa non-small cell lung cancer status post concurrent chemoradiation and he is currently undergoing consolidation chemotherapy with reduced dose carboplatin and paclitaxel is status post 2 cycles. The patient has been tolerating his treatment well with no significant adverse effects. I recommended for him to proceed with cycle #3 today as a scheduled. He would come back for follow-up visit in 3 weeks for reevaluation with repeat CT scan of the chest for restaging of his disease. The patient was advised to call immediately if he has any concerning symptoms in the interval.  Disclaimer: This note was dictated with voice recognition software. Similar sounding words can inadvertently be transcribed and may be missed upon review.   Eilleen Kempf., MD 07/22/2015

## 2015-07-23 ENCOUNTER — Ambulatory Visit: Payer: Medicare Other

## 2015-07-24 ENCOUNTER — Ambulatory Visit (HOSPITAL_BASED_OUTPATIENT_CLINIC_OR_DEPARTMENT_OTHER): Payer: Medicare Other

## 2015-07-24 VITALS — BP 116/64 | HR 67 | Temp 98.2°F

## 2015-07-24 DIAGNOSIS — C3411 Malignant neoplasm of upper lobe, right bronchus or lung: Secondary | ICD-10-CM | POA: Diagnosis present

## 2015-07-24 DIAGNOSIS — Z5189 Encounter for other specified aftercare: Secondary | ICD-10-CM | POA: Diagnosis not present

## 2015-07-24 MED ORDER — PEGFILGRASTIM INJECTION 6 MG/0.6ML ~~LOC~~
6.0000 mg | PREFILLED_SYRINGE | Freq: Once | SUBCUTANEOUS | Status: AC
  Administered 2015-07-24: 6 mg via SUBCUTANEOUS
  Filled 2015-07-24: qty 0.6

## 2015-07-29 ENCOUNTER — Other Ambulatory Visit (HOSPITAL_BASED_OUTPATIENT_CLINIC_OR_DEPARTMENT_OTHER): Payer: Medicare Other

## 2015-07-29 ENCOUNTER — Ambulatory Visit: Payer: Medicare Other | Admitting: Internal Medicine

## 2015-07-29 DIAGNOSIS — C3411 Malignant neoplasm of upper lobe, right bronchus or lung: Secondary | ICD-10-CM

## 2015-07-29 LAB — COMPREHENSIVE METABOLIC PANEL (CC13)
ALT: 16 U/L (ref 0–55)
AST: 16 U/L (ref 5–34)
Albumin: 3.4 g/dL — ABNORMAL LOW (ref 3.5–5.0)
Alkaline Phosphatase: 105 U/L (ref 40–150)
Anion Gap: 8 mEq/L (ref 3–11)
BUN: 13.5 mg/dL (ref 7.0–26.0)
CHLORIDE: 103 meq/L (ref 98–109)
CO2: 27 mEq/L (ref 22–29)
Calcium: 8.8 mg/dL (ref 8.4–10.4)
Creatinine: 1 mg/dL (ref 0.7–1.3)
EGFR: >90 mL/min/{1.73_m2} (ref >90)
GLUCOSE: 107 mg/dL (ref 70–140)
POTASSIUM: 4.1 meq/L (ref 3.5–5.1)
SODIUM: 138 meq/L (ref 136–145)
Total Bilirubin: 0.41 mg/dL (ref 0.20–1.20)
Total Protein: 6 g/dL — ABNORMAL LOW (ref 6.4–8.3)

## 2015-07-29 LAB — CBC WITH DIFFERENTIAL/PLATELET
BASO%: 0.1 % (ref 0.0–2.0)
BASOS ABS: 0 10*3/uL (ref 0.0–0.1)
EOS%: 1.2 % (ref 0.0–7.0)
Eosinophils Absolute: 0.1 10*3/uL (ref 0.0–0.5)
HEMATOCRIT: 31.1 % — AB (ref 38.4–49.9)
HGB: 10.3 g/dL — ABNORMAL LOW (ref 13.0–17.1)
LYMPH#: 0.5 10*3/uL — AB (ref 0.9–3.3)
LYMPH%: 6.3 % — ABNORMAL LOW (ref 14.0–49.0)
MCH: 30 pg (ref 27.2–33.4)
MCHC: 33.1 g/dL (ref 32.0–36.0)
MCV: 90.7 fL (ref 79.3–98.0)
MONO#: 0.9 10*3/uL (ref 0.1–0.9)
MONO%: 11.6 % (ref 0.0–14.0)
NEUT#: 6 10*3/uL (ref 1.5–6.5)
NEUT%: 80.8 % — AB (ref 39.0–75.0)
PLATELETS: 101 10*3/uL — AB (ref 140–400)
RBC: 3.43 10*6/uL — ABNORMAL LOW (ref 4.20–5.82)
RDW: 15.8 % — ABNORMAL HIGH (ref 11.0–14.6)
WBC: 7.4 10*3/uL (ref 4.0–10.3)

## 2015-08-05 ENCOUNTER — Other Ambulatory Visit (HOSPITAL_BASED_OUTPATIENT_CLINIC_OR_DEPARTMENT_OTHER): Payer: Medicare Other

## 2015-08-05 DIAGNOSIS — C3491 Malignant neoplasm of unspecified part of right bronchus or lung: Secondary | ICD-10-CM

## 2015-08-05 DIAGNOSIS — C3411 Malignant neoplasm of upper lobe, right bronchus or lung: Secondary | ICD-10-CM | POA: Diagnosis not present

## 2015-08-05 LAB — COMPREHENSIVE METABOLIC PANEL (CC13)
ALT: 15 U/L (ref 0–55)
AST: 17 U/L (ref 5–34)
Albumin: 3.5 g/dL (ref 3.5–5.0)
Alkaline Phosphatase: 105 U/L (ref 40–150)
Anion Gap: 9 mEq/L (ref 3–11)
BUN: 12.4 mg/dL (ref 7.0–26.0)
CO2: 24 meq/L (ref 22–29)
Calcium: 9.2 mg/dL (ref 8.4–10.4)
Chloride: 106 mEq/L (ref 98–109)
Creatinine: 1.1 mg/dL (ref 0.7–1.3)
EGFR: 79 mL/min/{1.73_m2} — AB (ref >90)
GLUCOSE: 101 mg/dL (ref 70–140)
Potassium: 4.1 mEq/L (ref 3.5–5.1)
SODIUM: 139 meq/L (ref 136–145)
TOTAL PROTEIN: 6.7 g/dL (ref 6.4–8.3)

## 2015-08-05 LAB — CBC WITH DIFFERENTIAL/PLATELET
BASO%: 0.6 % (ref 0.0–2.0)
Basophils Absolute: 0 10*3/uL (ref 0.0–0.1)
EOS%: 1.3 % (ref 0.0–7.0)
Eosinophils Absolute: 0.1 10*3/uL (ref 0.0–0.5)
HCT: 34.3 % — ABNORMAL LOW (ref 38.4–49.9)
HGB: 11.2 g/dL — ABNORMAL LOW (ref 13.0–17.1)
LYMPH%: 11.4 % — AB (ref 14.0–49.0)
MCH: 29.9 pg (ref 27.2–33.4)
MCHC: 32.7 g/dL (ref 32.0–36.0)
MCV: 91.4 fL (ref 79.3–98.0)
MONO#: 0.5 10*3/uL (ref 0.1–0.9)
MONO%: 9.6 % (ref 0.0–14.0)
NEUT%: 77.1 % — ABNORMAL HIGH (ref 39.0–75.0)
NEUTROS ABS: 3.7 10*3/uL (ref 1.5–6.5)
Platelets: 183 10*3/uL (ref 140–400)
RBC: 3.75 10*6/uL — AB (ref 4.20–5.82)
RDW: 17.7 % — ABNORMAL HIGH (ref 11.0–14.6)
WBC: 4.8 10*3/uL (ref 4.0–10.3)
lymph#: 0.5 10*3/uL — ABNORMAL LOW (ref 0.9–3.3)

## 2015-08-09 ENCOUNTER — Ambulatory Visit (HOSPITAL_COMMUNITY)
Admission: RE | Admit: 2015-08-09 | Discharge: 2015-08-09 | Disposition: A | Discharge status: Home / Self Care | Payer: Medicare Other | Source: Ambulatory Visit | Attending: Oncology | Admitting: Oncology

## 2015-08-09 ENCOUNTER — Encounter (HOSPITAL_COMMUNITY): Payer: Self-pay

## 2015-08-09 DIAGNOSIS — Z9221 Personal history of antineoplastic chemotherapy: Secondary | ICD-10-CM | POA: Diagnosis not present

## 2015-08-09 DIAGNOSIS — C3411 Malignant neoplasm of upper lobe, right bronchus or lung: Secondary | ICD-10-CM | POA: Diagnosis present

## 2015-08-09 DIAGNOSIS — E279 Disorder of adrenal gland, unspecified: Secondary | ICD-10-CM | POA: Insufficient documentation

## 2015-08-09 DIAGNOSIS — R911 Solitary pulmonary nodule: Secondary | ICD-10-CM | POA: Insufficient documentation

## 2015-08-09 DIAGNOSIS — I2510 Atherosclerotic heart disease of native coronary artery without angina pectoris: Secondary | ICD-10-CM | POA: Insufficient documentation

## 2015-08-09 MED ORDER — IOHEXOL 300 MG/ML  SOLN
75.0000 mL | Freq: Once | INTRAMUSCULAR | Status: AC | PRN
  Administered 2015-08-09: 75 mL via INTRAVENOUS

## 2015-08-12 ENCOUNTER — Telehealth: Payer: Self-pay | Admitting: Internal Medicine

## 2015-08-12 ENCOUNTER — Encounter: Payer: Self-pay | Admitting: Internal Medicine

## 2015-08-12 ENCOUNTER — Encounter: Payer: Self-pay | Admitting: *Deleted

## 2015-08-12 ENCOUNTER — Ambulatory Visit (HOSPITAL_BASED_OUTPATIENT_CLINIC_OR_DEPARTMENT_OTHER): Payer: Medicare Other | Admitting: Internal Medicine

## 2015-08-12 ENCOUNTER — Ambulatory Visit: Payer: Medicare Other

## 2015-08-12 ENCOUNTER — Other Ambulatory Visit (HOSPITAL_BASED_OUTPATIENT_CLINIC_OR_DEPARTMENT_OTHER): Payer: Medicare Other

## 2015-08-12 VITALS — BP 126/70 | HR 77 | Temp 98.5°F | Resp 19 | Ht 68.0 in | Wt 203.1 lb

## 2015-08-12 DIAGNOSIS — C3411 Malignant neoplasm of upper lobe, right bronchus or lung: Secondary | ICD-10-CM

## 2015-08-12 DIAGNOSIS — C3491 Malignant neoplasm of unspecified part of right bronchus or lung: Secondary | ICD-10-CM

## 2015-08-12 DIAGNOSIS — Z720 Tobacco use: Secondary | ICD-10-CM | POA: Diagnosis not present

## 2015-08-12 LAB — COMPREHENSIVE METABOLIC PANEL
ALBUMIN: 3.5 g/dL (ref 3.5–5.0)
ALK PHOS: 97 U/L (ref 40–150)
ALT: 18 U/L (ref 0–55)
AST: 20 U/L (ref 5–34)
Anion Gap: 11 mEq/L (ref 3–11)
BUN: 12.3 mg/dL (ref 7.0–26.0)
CALCIUM: 8.9 mg/dL (ref 8.4–10.4)
CO2: 24 mEq/L (ref 22–29)
Chloride: 104 mEq/L (ref 98–109)
Creatinine: 1 mg/dL (ref 0.7–1.3)
EGFR: 85 mL/min/{1.73_m2} — AB (ref >90)
GLUCOSE: 90 mg/dL (ref 70–140)
POTASSIUM: 4.1 meq/L (ref 3.5–5.1)
SODIUM: 139 meq/L (ref 136–145)
TOTAL PROTEIN: 6.6 g/dL (ref 6.4–8.3)

## 2015-08-12 LAB — CBC WITH DIFFERENTIAL/PLATELET
BASO%: 2 % (ref 0.0–2.0)
BASOS ABS: 0.1 10*3/uL (ref 0.0–0.1)
EOS ABS: 0.1 10*3/uL (ref 0.0–0.5)
EOS%: 2.2 % (ref 0.0–7.0)
HEMATOCRIT: 35.5 % — AB (ref 38.4–49.9)
HEMOGLOBIN: 11.6 g/dL — AB (ref 13.0–17.1)
LYMPH#: 0.7 10*3/uL — AB (ref 0.9–3.3)
LYMPH%: 19.6 % (ref 14.0–49.0)
MCH: 30 pg (ref 27.2–33.4)
MCHC: 32.6 g/dL (ref 32.0–36.0)
MCV: 92 fL (ref 79.3–98.0)
MONO#: 0.6 10*3/uL (ref 0.1–0.9)
MONO%: 17.5 % — ABNORMAL HIGH (ref 0.0–14.0)
NEUT%: 58.7 % (ref 39.0–75.0)
NEUTROS ABS: 2.1 10*3/uL (ref 1.5–6.5)
Platelets: 220 10*3/uL (ref 140–400)
RBC: 3.86 10*6/uL — ABNORMAL LOW (ref 4.20–5.82)
RDW: 17.5 % — AB (ref 11.0–14.6)
WBC: 3.5 10*3/uL — AB (ref 4.0–10.3)

## 2015-08-12 NOTE — Patient Instructions (Signed)
Smoking Cessation, Tips for Success If you are ready to quit smoking, congratulations! You have chosen to help yourself be healthier. Cigarettes bring nicotine, tar, carbon monoxide, and other irritants into your body. Your lungs, heart, and blood vessels will be able to work better without these poisons. There are many different ways to quit smoking. Nicotine gum, nicotine patches, a nicotine inhaler, or nicotine nasal spray can help with physical craving. Hypnosis, support groups, and medicines help break the habit of smoking. WHAT THINGS CAN I DO TO MAKE QUITTING EASIER?  Here are some tips to help you quit for good:  Pick a date when you will quit smoking completely. Tell all of your friends and family about your plan to quit on that date.  Do not try to slowly cut down on the number of cigarettes you are smoking. Pick a quit date and quit smoking completely starting on that day.  Throw away all cigarettes.   Clean and remove all ashtrays from your home, work, and car.  On a card, write down your reasons for quitting. Carry the card with you and read it when you get the urge to smoke.  Cleanse your body of nicotine. Drink enough water and fluids to keep your urine clear or pale yellow. Do this after quitting to flush the nicotine from your body.  Learn to predict your moods. Do not let a bad situation be your excuse to have a cigarette. Some situations in your life might tempt you into wanting a cigarette.  Never have "just one" cigarette. It leads to wanting another and another. Remind yourself of your decision to quit.  Change habits associated with smoking. If you smoked while driving or when feeling stressed, try other activities to replace smoking. Stand up when drinking your coffee. Brush your teeth after eating. Sit in a different chair when you read the paper. Avoid alcohol while trying to quit, and try to drink fewer caffeinated beverages. Alcohol and caffeine may urge you to  smoke.  Avoid foods and drinks that can trigger a desire to smoke, such as sugary or spicy foods and alcohol.  Ask people who smoke not to smoke around you.  Have something planned to do right after eating or having a cup of coffee. For example, plan to take a walk or exercise.  Try a relaxation exercise to calm you down and decrease your stress. Remember, you may be tense and nervous for the first 2 weeks after you quit, but this will pass.  Find new activities to keep your hands busy. Play with a pen, coin, or rubber band. Doodle or draw things on paper.  Brush your teeth right after eating. This will help cut down on the craving for the taste of tobacco after meals. You can also try mouthwash.   Use oral substitutes in place of cigarettes. Try using lemon drops, carrots, cinnamon sticks, or chewing gum. Keep them handy so they are available when you have the urge to smoke.  When you have the urge to smoke, try deep breathing.  Designate your home as a nonsmoking area.  If you are a heavy smoker, ask your health care provider about a prescription for nicotine chewing gum. It can ease your withdrawal from nicotine.  Reward yourself. Set aside the cigarette money you save and buy yourself something nice.  Look for support from others. Join a support group or smoking cessation program. Ask someone at home or at work to help you with your plan   to quit smoking.  Always ask yourself, "Do I need this cigarette or is this just a reflex?" Tell yourself, "Today, I choose not to smoke," or "I do not want to smoke." You are reminding yourself of your decision to quit.  Do not replace cigarette smoking with electronic cigarettes (commonly called e-cigarettes). The safety of e-cigarettes is unknown, and some may contain harmful chemicals.  If you relapse, do not give up! Plan ahead and think about what you will do the next time you get the urge to smoke. HOW WILL I FEEL WHEN I QUIT SMOKING? You  may have symptoms of withdrawal because your body is used to nicotine (the addictive substance in cigarettes). You may crave cigarettes, be irritable, feel very hungry, cough often, get headaches, or have difficulty concentrating. The withdrawal symptoms are only temporary. They are strongest when you first quit but will go away within 10-14 days. When withdrawal symptoms occur, stay in control. Think about your reasons for quitting. Remind yourself that these are signs that your body is healing and getting used to being without cigarettes. Remember that withdrawal symptoms are easier to treat than the major diseases that smoking can cause.  Even after the withdrawal is over, expect periodic urges to smoke. However, these cravings are generally short lived and will go away whether you smoke or not. Do not smoke! WHAT RESOURCES ARE AVAILABLE TO HELP ME QUIT SMOKING? Your health care provider can direct you to community resources or hospitals for support, which may include:  Group support.  Education.  Hypnosis.  Therapy.   This information is not intended to replace advice given to you by your health care provider. Make sure you discuss any questions you have with your health care provider.   Document Released: 05/22/2004 Document Revised: 09/14/2014 Document Reviewed: 02/09/2013 Elsevier Interactive Patient Education 2016 [employer]  

## 2015-08-12 NOTE — Progress Notes (Signed)
Oncology Nurse Navigator Documentation  Oncology Nurse Navigator Flowsheets 08/12/2015  Navigator Encounter Type Clinic/MDC/I spoke with patient and wife today at [facility].  I followed up with him regarding his smoking cessation.  He is still smoking.  I gave him some information to help quit.  I asked that he call me if he needs help with smoking cessation.    Patient Visit Type Follow-up  Treatment Phase Other  Barriers/Navigation Needs Education  Education Smoking cessation  Interventions -  Education Method Verbal;Written  Time Spent with Patient 15

## 2015-08-12 NOTE — Progress Notes (Signed)
[facility] Telephone:(336) [phone]   [phone]  OFFICE PROGRESS NOTE  [name], [place] [address]  DIAGNOSIS: Stage IIIA (T3, N2, M0) non-small cell lung cancer, squamous cell carcinoma diagnosed in June 2016.  PRIOR THERAPY:  1) Concurrent chemoradiation with weekly carboplatin for an AUC of 2 and paclitaxel 45 mg/m2 with partial response.  2) Consolidation systemic chemotherapy with carboplatin for AUC of 5 and paclitaxel 175 MG/M2 every 3 weeks. First cycle 06/10/2015. Status post 3 cycles.  CURRENT THERAPY: Observation.  INTERVAL HISTORY: Colton Nguyen 69 y.o. male returns to the clinic today for follow-up visit accompanied by his wife. The patient is feeling fine today with no specific complaints. He denied having any significant nausea or vomiting, no fever or chills. He denied having any significant weight loss or night sweats. He has no dysphagia or odynophagia. The patient denied having any significant chest pain, shortness of breath, cough or hemoptysis. He tolerated the previous course of consolidation chemotherapy fairly well with no significant adverse effects. The patient had repeat CT scan of the chest performed recently and he is here for evaluation and discussion of his scan results. Unfortunately he continues to smoke.  MEDICAL HISTORY: Past Medical History  Diagnosis Date  . CHF (congestive heart failure) ([facility])   . Hypertension   . Hyperlipidemia   . BPH (benign prostatic hyperplasia)   . CAD (coronary artery disease)   . Chronic cardiopulmonary disease ([facility])   . Nicotine dependence   . Mass of lung   . COPD (chronic obstructive pulmonary disease) ([facility])   . Shortness of breath dyspnea     with exertion  . Anxiety     situational  . Personal history of kidney stones   . Restless leg     ALLERGIES:  has No Known Allergies.  MEDICATIONS:  Current Outpatient Prescriptions  Medication Sig  Dispense Refill  . acetaminophen (TYLENOL) 325 MG tablet Take 650 mg by mouth every 6 (six) hours as needed.    Marland Kitchen amLODipine (NORVASC) 5 MG tablet Take 5 mg by mouth daily.    Marland Kitchen aspirin 81 MG tablet Take 81 mg by mouth daily.    Marland Kitchen atorvastatin (LIPITOR) 40 MG tablet Take 20 mg by mouth daily.    Marland Kitchen BYSTOLIC 20 MG TABS Take 20 mg by mouth daily.    . cetirizine (ZYRTEC) 5 MG chewable tablet Chew 5 mg by mouth daily.    . CHANTIX STARTING MONTH PAK 0.5 MG X 11 & 1 MG X 42 tablet Take 0.5-1 mg by mouth as directed.     . Cholecalciferol (VITAMIN D3) 5000 UNITS TABS Take 5,000 Units by mouth daily.    . finasteride (PROSCAR) 5 MG tablet Take 5 mg by mouth daily.     . fluticasone (FLONASE) 50 MCG/ACT nasal spray     . losartan (COZAAR) 50 MG tablet Take 50 mg by mouth daily.    . ondansetron (ZOFRAN) 4 MG tablet Take 4 mg by mouth as directed.    Marland Kitchen oxyCODONE-acetaminophen (PERCOCET/ROXICET) 5-325 MG tablet Take 1 tablet by mouth as directed.    Marland Kitchen PROAIR HFA 108 (90 BASE) MCG/ACT inhaler Inhale 2 puffs into the lungs every 4 (four) hours as needed for wheezing or shortness of breath.     . prochlorperazine (COMPAZINE) 10 MG tablet Take 1 tablet (10 mg total) by mouth every 6 (six) hours as needed for nausea or vomiting. 30 tablet  0  . tamsulosin (FLOMAX) 0.4 MG CAPS capsule Take 0.4 mg by mouth daily.     . Wound Cleansers (RADIAPLEX EX) Apply topically.     No current facility-administered medications for this visit.    SURGICAL HISTORY:  Past Surgical History  Procedure Laterality Date  . Cad ccta 01/31/15    . Right inguinal hernia repair at age 40    . Colonoscopy    . Cervical fusion    . Back surgery    . Video bronchoscopy with endobronchial ultrasound N/A 02/27/2015    Procedure: VIDEO BRONCHOSCOPY WITH ENDOBRONCHIAL ULTRASOUND;  Surgeon: Grace Isaac, MD;  Location: [facility];  Service: Thoracic;  Laterality: N/A;  . Lung biopsy N/A 02/27/2015    Procedure: LUNG BIOPSY;  Surgeon:  Grace Isaac, MD;  Location: [facility];  Service: Thoracic;  Laterality: N/A;    REVIEW OF SYSTEMS:  Constitutional: positive for fatigue Eyes: negative Ears, nose, mouth, throat, and face: negative Respiratory: positive for cough Cardiovascular: negative Gastrointestinal: negative Genitourinary:negative Integument/breast: negative Hematologic/lymphatic: negative Musculoskeletal:negative Neurological: negative Behavioral/Psych: negative Endocrine: negative Allergic/Immunologic: negative   PHYSICAL EXAMINATION: General appearance: alert, cooperative and no distress Head: Normocephalic, without obvious abnormality, atraumatic Neck: no adenopathy, no JVD, supple, symmetrical, trachea midline and thyroid not enlarged, symmetric, no tenderness/mass/nodules Lymph nodes: Cervical, supraclavicular, and axillary nodes normal. Resp: clear to auscultation bilaterally Back: symmetric, no curvature. ROM normal. No CVA tenderness. Cardio: regular rate and rhythm, S1, S2 normal, no murmur, click, rub or gallop GI: soft, non-tender; bowel sounds normal; no masses,  no organomegaly Extremities: extremities normal, atraumatic, no cyanosis or edema Neurologic: Alert and oriented X 3, normal strength and tone. Normal symmetric reflexes. Normal coordination and gait  ECOG PERFORMANCE STATUS: 2 - Symptomatic, <50% confined to bed  Blood pressure 126/70, pulse 77, temperature 98.5 F (36.9 C), temperature source Oral, resp. rate 19, height '5\' 8"'$  (1.727 m), weight 203 lb 1.6 oz (92.126 kg), SpO2 98 %.  LABORATORY DATA: Lab Results  Component Value Date   WBC 3.5* 08/12/2015   HGB 11.6* 08/12/2015   HCT 35.5* 08/12/2015   MCV 92.0 08/12/2015   PLT 220 08/12/2015      Chemistry      Component Value Date/Time   NA 139 08/05/2015 0803   NA 136 02/27/2015 0701   K 4.1 08/05/2015 0803   K 4.3 02/27/2015 0701   CL 102 02/27/2015 0701   CO2 24 08/05/2015 0803   CO2 22 02/27/2015 0701   BUN  12.4 08/05/2015 0803   BUN 11 02/27/2015 0701   CREATININE 1.1 08/05/2015 0803   CREATININE 1.07 02/27/2015 0701      Component Value Date/Time   CALCIUM 9.2 08/05/2015 0803   CALCIUM 8.7* 02/27/2015 0701   ALKPHOS 105 08/05/2015 0803   ALKPHOS 103 02/27/2015 0701   AST 17 08/05/2015 0803   AST 28 02/27/2015 0701   ALT 15 08/05/2015 0803   ALT 17 02/27/2015 0701   BILITOT <0.30 08/05/2015 0803   BILITOT 0.7 02/27/2015 0701       RADIOGRAPHIC STUDIES: Ct Chest W Contrast  08/09/2015  CLINICAL DATA:  Right upper lobe stage IIIA lung cancer diagnosed 7/16. Status post chemotherapy. EXAM: CT CHEST WITH CONTRAST TECHNIQUE: Multidetector CT imaging of the chest was performed during intravenous contrast administration. CONTRAST:  64m OMNIPAQUE IOHEXOL 300 MG/ML  SOLN COMPARISON:  05/31/2015. FINDINGS: Mediastinum/Nodes: Diffuse thyroid enlargement with small low-density nodules within, similar. No supraclavicular adenopathy. Aortic and branch vessel atherosclerosis.  Normal heart size, without pericardial effusion. Multivessel coronary artery atherosclerosis. No central pulmonary embolism, on this non-dedicated study. No mediastinal or hilar adenopathy. Lungs/Pleura: No pleural fluid. Lower lobe predominant bronchial wall thickening. Moderate centrilobular emphysema. High superior segment right lower lobe 6 mm pulmonary nodule is unchanged on image 19/series 5. Minimal subpleural irregularity in the left lower lobe on image 48 is new Re- demonstration of treatment changes in the right apex. Interstitial thickening with mild bronchiectasis, especially medially. This is similar in size and morphology, without well-defined residual or recurrent disease. Fluid or secretions within posterior right upper lobe bronchi including on image 18/series 5. Subpleural right apical nodular density measures 4 mm on image 14/series 5 and is similar. Upper abdomen: Vague right hepatic lobe posterior enhancement is again  identified on image 58/ series 2 and favored to be related to a perfusion anomaly. Normal imaged portions of the spleen, stomach, pancreas, gallbladder, kidneys. Right adrenal nodule measures 1.7 cm and is similar. Left adrenal thickening and mild nodularity are not significantly changed. Musculoskeletal: Degenerative disc disease at multiple thoracic levels. IMPRESSION: 1. Treatment changes within the right apex, without locally recurrent or metastatic disease identified. Similar size of the superior segment right lower lobe 6 mm nodule (hypermetabolic on prior PET). 2.  Atherosclerosis, including within the coronary arteries. 3. Similar bilateral adrenal nodularity, likely related to adenomas. Electronically Signed   By: Abigail Miyamoto M.D.   On: 08/09/2015 14:08    ASSESSMENT AND PLAN: This is a very pleasant 69 years old African-American male with a stage IIIa non-small cell lung cancer. The patient completed a course of concurrent chemoradiation with excellent response to his treatment. He underwent consolidation chemotherapy with reduced dose carboplatin and paclitaxel status post 3 cycles. Restaging CT scan of the chest showed no evidence for disease progression. I discussed the scan results with the patient and his wife. I recommended for him to continue on observation with repeat CT scan of the chest in 3 months. For smoke cessation I strongly encouraged the patient to quit smoking and he received counseling from the thoracic navigator today. He was advised to call immediately if he has any concerning symptoms in the interval. The patient voices understanding of current disease status and treatment options and is in agreement with the current care plan.  All questions were answered. The patient knows to call the clinic with any problems, questions or concerns. We can certainly see the patient much sooner if necessary.  Disclaimer: This note was dictated with voice recognition software. Similar  sounding words can inadvertently be transcribed and may not be corrected upon review.

## 2015-08-12 NOTE — Telephone Encounter (Signed)
per pof to sch pt appt-gave pt copy of avs-adv central sch will call to sch t

## 2015-08-13 ENCOUNTER — Ambulatory Visit: Payer: Medicare Other

## 2015-08-14 ENCOUNTER — Encounter: Payer: Self-pay | Admitting: *Deleted

## 2015-08-19 ENCOUNTER — Ambulatory Visit: Payer: Medicare Other | Admitting: Internal Medicine

## 2015-08-19 ENCOUNTER — Other Ambulatory Visit: Payer: Medicare Other

## 2015-10-20 IMAGING — CT CT CHEST W/ CM
2 of 3 series · 15 of 36 positions shown, 18 images · IV contrast (omnipaque)
Comparison: PET-CT 02/20/2015

CLINICAL DATA: Restaging non small cell lung cancer.

EXAM:
CT CHEST WITH CONTRAST
TECHNIQUE: Multidetector CT imaging of the chest was performed during
intravenous contrast administration.
CONTRAST:  75mL OMNIPAQUE IOHEXOL 300 MG/ML  SOLN

[Series 2: chest with st · axial · 0.76mm/px · z∈[-292,-12]mm · 12 of 66 slices shown, 15 images]
[im 5/66  mediastinal]
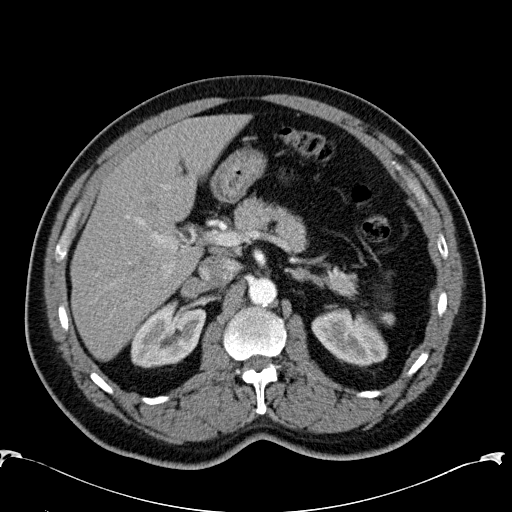
[im 5/66  lung]
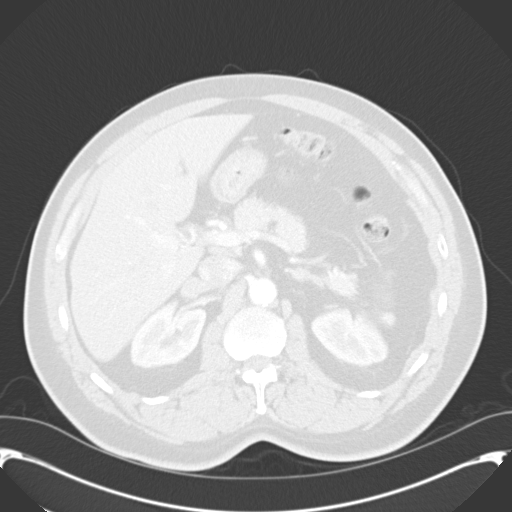
[im 10/66  lung]
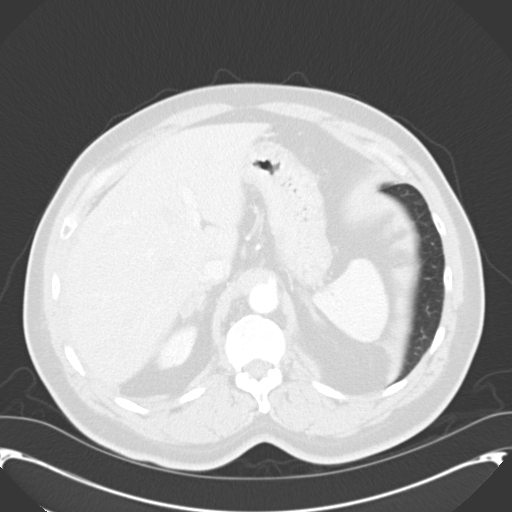
[im 15/66  lung]
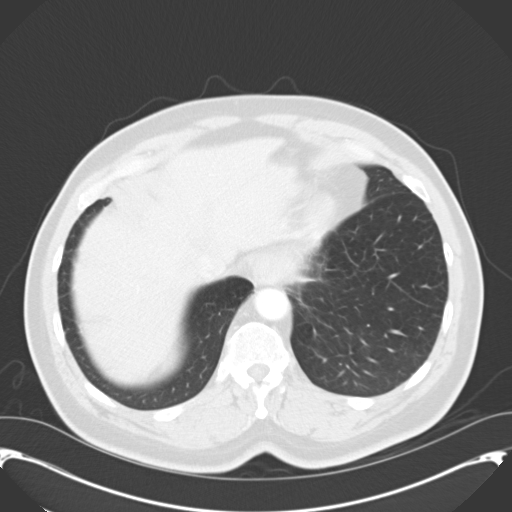
[im 20/66  lung]
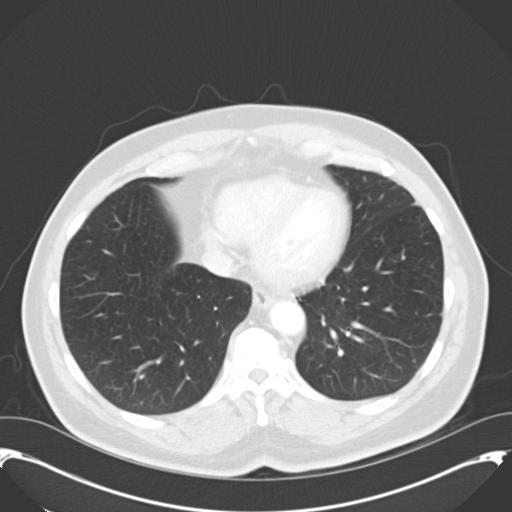
[im 25/66  mediastinal]
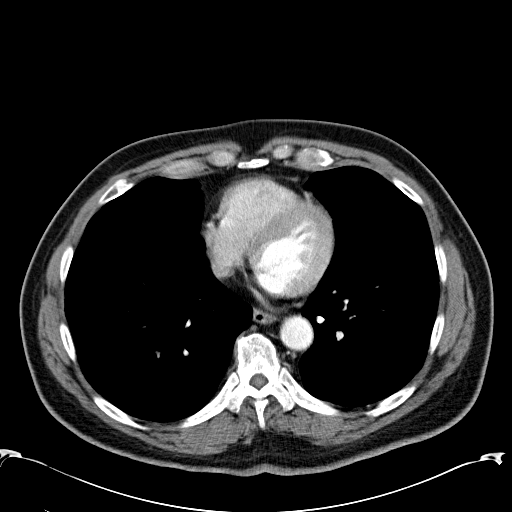
[im 25/66  lung]
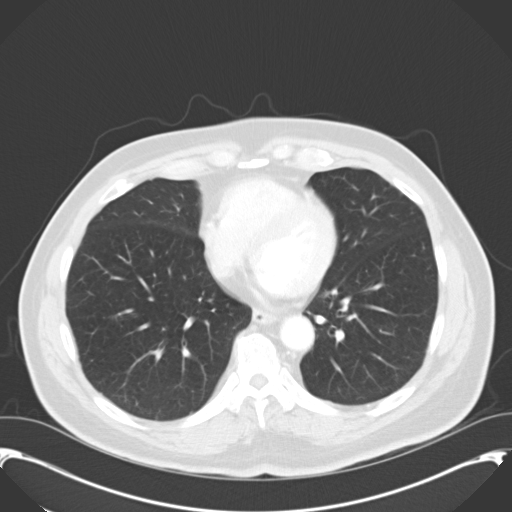
[im 29/66  lung]
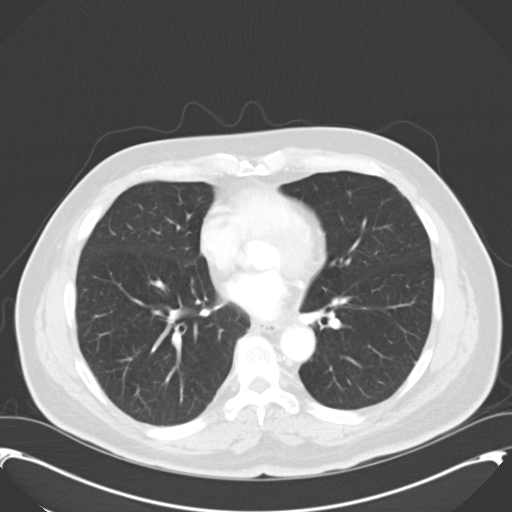
[im 37/66  lung]
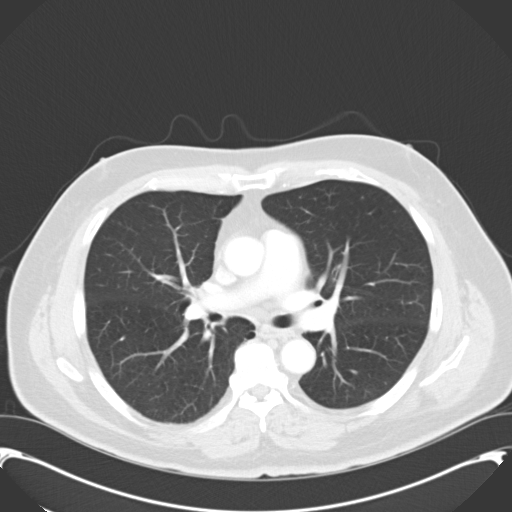
[im 41/66  lung]
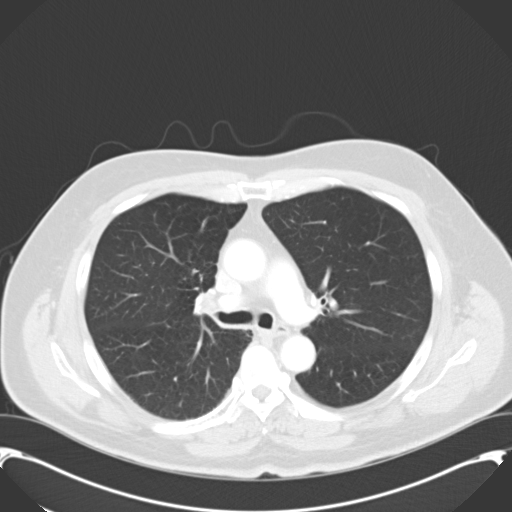
[im 46/66  mediastinal]
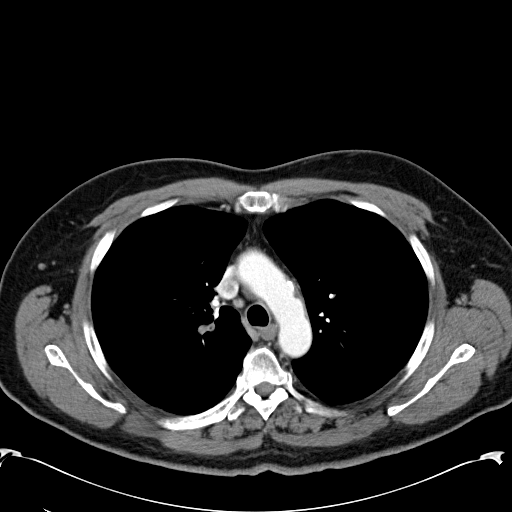
[im 46/66  lung]
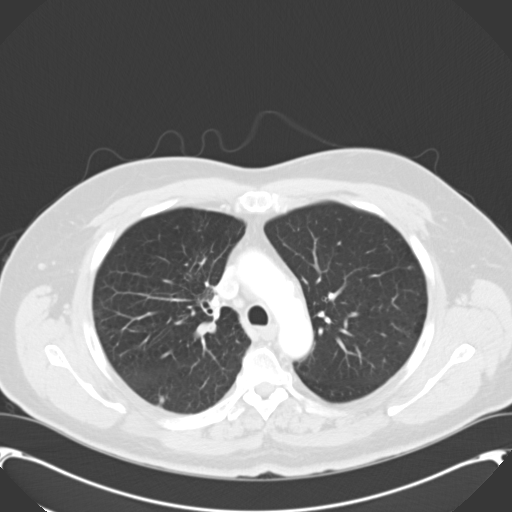
[im 51/66  lung]
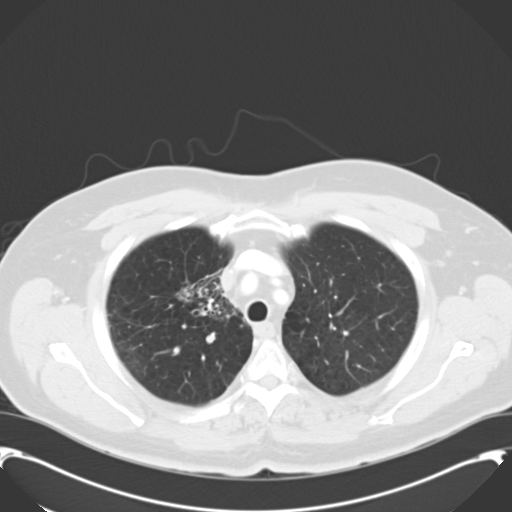
[im 56/66  lung]
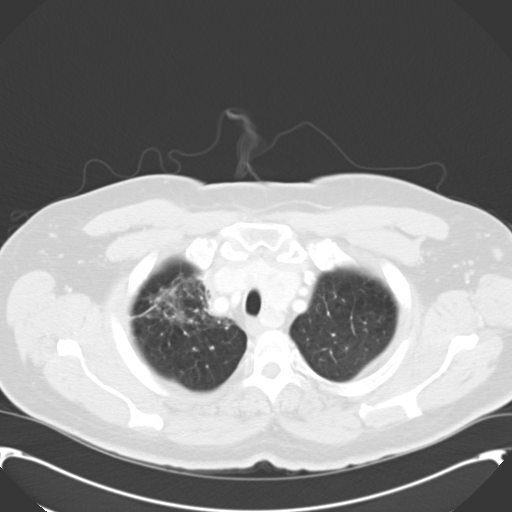
[im 61/66  lung]
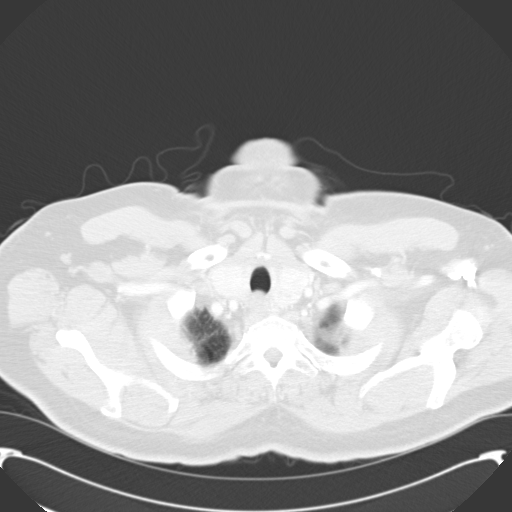

[Series 605: <mpr thick range(1)> · coronal · 0.76mm/px · 3 of 143 slices shown]
[im 29/143  lung]
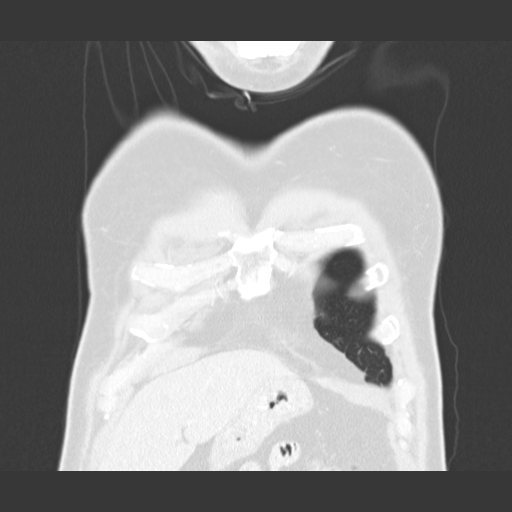
[im 57/143  lung]
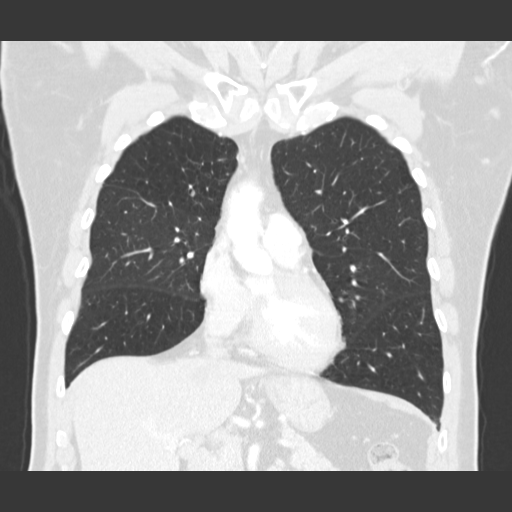
[im 86/143  lung]
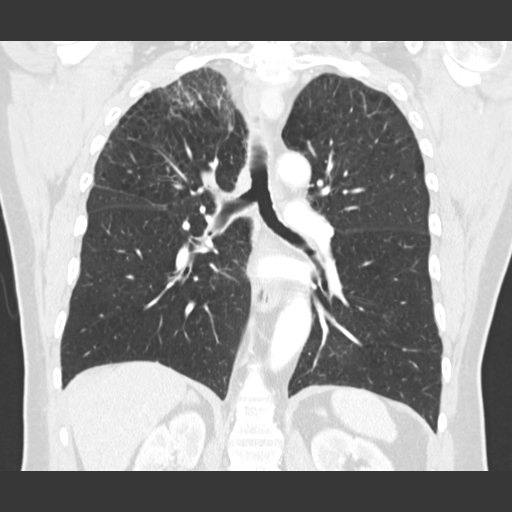

[15 of 36 positions shown; findings below may reference images not displayed]

FINDINGS: Mediastinum/Nodes: No chest wall mass, supraclavicular or axillary
lymphadenopathy. Stable multinodular thyroid goiter.

The heart is normal in size. No pericardial effusion. No mediastinal
or hilar mass or lymphadenopathy. The aorta is normal in caliber.
Stable atherosclerotic calcifications. No dissection. The pulmonary
arteries appear normal. Coronary artery calcifications are stable.

A pretracheal lymph node on image number 19 measures 6.5 mm and
previously measured 9 mm.

Right paratracheal lymph node on image 23 measures 8 mm and
previously measured 15 mm.

Right hilar lymph node on image 25 measures a maximum of 11 mm and
previously measured 3 cm.

Lungs/Pleura: The right upper lobe mass is no longer measurable.
There are radiation changes involving the right upper lobe but no
discernible mass lesion. Mild right upper lobe bronchiectasis is
noted.

The 9 mm pulmonary nodule in the superior segment of the right lower
lobe now measures 6 mm. A few small scattered subpleural nodules are
unchanged and likely represent lymph nodes. No new pulmonary
lesions. Stable emphysematous changes. No pleural effusion or acute
pulmonary findings.

Upper abdomen: Stable bilateral adrenal gland nodules. These are
likely benign adenomas and are not metabolically active on the prior
PET-CT. No findings for hepatic metastatic disease. Area of somewhat
linear contrast enhancement are hyper attenuation the right hepatic
lobe on image number 60 is not demonstrated on the prior PET-CT and
is most likely a vascular shunt.

Musculoskeletal: No significant osseous abnormalities.
IMPRESSION: 1. Chest CT demonstrates an excellent response to therapy. The large
right upper lobe lung mass is no longer present. There are radiation
changes noted. Interval decrease in size of mediastinal and right
hilar lymph nodes.
2. Interval decrease in size of the hypermetabolic 9 mm superior
segment right lower lobe lung nodule. No new pulmonary lesions.
3. Stable bilateral adrenal gland adenomas.
4. Area of increased attenuation/enhancement in the right hepatic
lobe is most likely some type of vascular shunt.

## 2015-10-30 ENCOUNTER — Other Ambulatory Visit: Payer: Self-pay | Admitting: Medical Oncology

## 2015-11-01 ENCOUNTER — Telehealth: Payer: Self-pay | Admitting: Internal Medicine

## 2015-11-01 NOTE — Telephone Encounter (Signed)
S/w pt, gave appt for 3/7 @ 8.45.

## 2015-11-11 ENCOUNTER — Other Ambulatory Visit: Payer: Medicare Other

## 2015-11-12 ENCOUNTER — Ambulatory Visit (HOSPITAL_COMMUNITY)
Admission: RE | Admit: 2015-11-12 | Discharge: 2015-11-12 | Disposition: A | Discharge status: Home / Self Care | Payer: Medicare Other | Source: Ambulatory Visit | Attending: Internal Medicine | Admitting: Internal Medicine

## 2015-11-12 ENCOUNTER — Other Ambulatory Visit (HOSPITAL_BASED_OUTPATIENT_CLINIC_OR_DEPARTMENT_OTHER): Payer: Medicare Other

## 2015-11-12 DIAGNOSIS — I7090 Unspecified atherosclerosis: Secondary | ICD-10-CM | POA: Insufficient documentation

## 2015-11-12 DIAGNOSIS — C3411 Malignant neoplasm of upper lobe, right bronchus or lung: Secondary | ICD-10-CM | POA: Diagnosis not present

## 2015-11-12 DIAGNOSIS — F17210 Nicotine dependence, cigarettes, uncomplicated: Secondary | ICD-10-CM | POA: Insufficient documentation

## 2015-11-12 DIAGNOSIS — R918 Other nonspecific abnormal finding of lung field: Secondary | ICD-10-CM | POA: Diagnosis not present

## 2015-11-12 DIAGNOSIS — I2510 Atherosclerotic heart disease of native coronary artery without angina pectoris: Secondary | ICD-10-CM | POA: Diagnosis not present

## 2015-11-12 DIAGNOSIS — C3491 Malignant neoplasm of unspecified part of right bronchus or lung: Secondary | ICD-10-CM

## 2015-11-12 DIAGNOSIS — Z720 Tobacco use: Secondary | ICD-10-CM

## 2015-11-12 LAB — COMPREHENSIVE METABOLIC PANEL
ALK PHOS: 97 U/L (ref 40–150)
ALT: 10 U/L (ref 0–55)
ANION GAP: 10 meq/L (ref 3–11)
AST: 14 U/L (ref 5–34)
Albumin: 3.4 g/dL — ABNORMAL LOW (ref 3.5–5.0)
BILIRUBIN TOTAL: 0.55 mg/dL (ref 0.20–1.20)
BUN: 7.1 mg/dL (ref 7.0–26.0)
CO2: 25 mEq/L (ref 22–29)
Calcium: 8.8 mg/dL (ref 8.4–10.4)
Chloride: 104 mEq/L (ref 98–109)
Creatinine: 1.1 mg/dL (ref 0.7–1.3)
EGFR: 83 mL/min/{1.73_m2} — AB (ref >90)
GLUCOSE: 96 mg/dL (ref 70–140)
Potassium: 4.4 mEq/L (ref 3.5–5.1)
SODIUM: 139 meq/L (ref 136–145)
Total Protein: 6.8 g/dL (ref 6.4–8.3)

## 2015-11-12 LAB — CBC WITH DIFFERENTIAL/PLATELET
BASO%: 0.6 % (ref 0.0–2.0)
Basophils Absolute: 0 10*3/uL (ref 0.0–0.1)
EOS ABS: 0.2 10*3/uL (ref 0.0–0.5)
EOS%: 3.3 % (ref 0.0–7.0)
HCT: 41.3 % (ref 38.4–49.9)
HGB: 13.5 g/dL (ref 13.0–17.1)
LYMPH%: 15.5 % (ref 14.0–49.0)
MCH: 27 pg — AB (ref 27.2–33.4)
MCHC: 32.7 g/dL (ref 32.0–36.0)
MCV: 82.5 fL (ref 79.3–98.0)
MONO#: 0.5 10*3/uL (ref 0.1–0.9)
MONO%: 11 % (ref 0.0–14.0)
NEUT%: 69.6 % (ref 39.0–75.0)
NEUTROS ABS: 3.4 10*3/uL (ref 1.5–6.5)
PLATELETS: 222 10*3/uL (ref 140–400)
RBC: 5.01 10*6/uL (ref 4.20–5.82)
RDW: 16.3 % — ABNORMAL HIGH (ref 11.0–14.6)
WBC: 4.9 10*3/uL (ref 4.0–10.3)
lymph#: 0.8 10*3/uL — ABNORMAL LOW (ref 0.9–3.3)

## 2015-11-12 MED ORDER — IOHEXOL 300 MG/ML  SOLN
75.0000 mL | Freq: Once | INTRAMUSCULAR | Status: AC | PRN
  Administered 2015-11-12: 75 mL via INTRAVENOUS

## 2015-11-14 ENCOUNTER — Encounter: Payer: Self-pay | Admitting: Internal Medicine

## 2015-11-14 ENCOUNTER — Ambulatory Visit (HOSPITAL_BASED_OUTPATIENT_CLINIC_OR_DEPARTMENT_OTHER): Payer: Medicare Other | Admitting: Internal Medicine

## 2015-11-14 ENCOUNTER — Telehealth: Payer: Self-pay | Admitting: Internal Medicine

## 2015-11-14 VITALS — BP 142/74 | HR 72 | Temp 98.3°F | Resp 17 | Ht 68.0 in | Wt 202.7 lb

## 2015-11-14 DIAGNOSIS — C3411 Malignant neoplasm of upper lobe, right bronchus or lung: Secondary | ICD-10-CM | POA: Diagnosis present

## 2015-11-14 DIAGNOSIS — Z720 Tobacco use: Secondary | ICD-10-CM

## 2015-11-14 NOTE — Progress Notes (Signed)
[facility] Telephone:(336) [phone]   Fax:(336) [phone]  OFFICE PROGRESS NOTE  [address], [place] [address]  DIAGNOSIS: Stage IIIA (T3, N2, M0) non-small cell lung cancer, squamous cell carcinoma diagnosed in June 2016.  PRIOR THERAPY:  1) Concurrent chemoradiation with weekly carboplatin for an AUC of 2 and paclitaxel 45 mg/m2 with partial response.  2) Consolidation systemic chemotherapy with carboplatin for AUC of 5 and paclitaxel 175 MG/M2 every 3 weeks. First cycle 06/10/2015. Status post 3 cycles.  CURRENT THERAPY: Observation.  INTERVAL HISTORY: Colton Nguyen 70 y.o. male returns to the clinic today for follow-up visit. He has been observation for the last few months. The patient is feeling fine today with no specific complaints. He denied having any significant nausea or vomiting, no fever or chills. He denied having any significant weight loss or night sweats. He has no dysphagia or odynophagia. The patient denied having any significant chest pain, but continues to have shortness of breath with exertion with mild cough with no hemoptysis. The patient had repeat CT scan of the chest performed recently and he is here for evaluation and discussion of his scan results. Unfortunately he continues to smoke.  MEDICAL HISTORY: Past Medical History  Diagnosis Date  . CHF (congestive heart failure) ([facility])   . Hypertension   . Hyperlipidemia   . BPH (benign prostatic hyperplasia)   . CAD (coronary artery disease)   . Chronic cardiopulmonary disease ([facility])   . Nicotine dependence   . Mass of lung   . COPD (chronic obstructive pulmonary disease) ([facility])   . Shortness of breath dyspnea     with exertion  . Anxiety     situational  . Personal history of kidney stones   . Restless leg     ALLERGIES:  has No Known Allergies.  MEDICATIONS:  Current Outpatient Prescriptions  Medication Sig Dispense Refill  . acetaminophen  (TYLENOL) 325 MG tablet Take 650 mg by mouth every 6 (six) hours as needed.    Marland Kitchen amLODipine (NORVASC) 5 MG tablet Take 5 mg by mouth daily.    Marland Kitchen aspirin 81 MG tablet Take 81 mg by mouth daily.    Marland Kitchen atorvastatin (LIPITOR) 40 MG tablet Take 20 mg by mouth daily.    Marland Kitchen BYSTOLIC 20 MG TABS Take 20 mg by mouth daily.    . cetirizine (ZYRTEC) 5 MG chewable tablet Chew 5 mg by mouth daily.    . CHANTIX STARTING MONTH PAK 0.5 MG X 11 & 1 MG X 42 tablet Take 0.5-1 mg by mouth as directed.     . Cholecalciferol (VITAMIN D3) 5000 UNITS TABS Take 5,000 Units by mouth daily.    . finasteride (PROSCAR) 5 MG tablet Take 5 mg by mouth daily.     . fluticasone (FLONASE) 50 MCG/ACT nasal spray     . losartan (COZAAR) 50 MG tablet Take 50 mg by mouth daily.    . ondansetron (ZOFRAN) 4 MG tablet Take 4 mg by mouth as directed.    Marland Kitchen oxyCODONE-acetaminophen (PERCOCET/ROXICET) 5-325 MG tablet Take 1 tablet by mouth as directed.    Marland Kitchen PROAIR HFA 108 (90 BASE) MCG/ACT inhaler Inhale 2 puffs into the lungs every 4 (four) hours as needed for wheezing or shortness of breath.     . prochlorperazine (COMPAZINE) 10 MG tablet Take 1 tablet (10 mg total) by mouth every 6 (six) hours as needed for nausea or vomiting. 30 tablet 0  .  tamsulosin (FLOMAX) 0.4 MG CAPS capsule Take 0.4 mg by mouth daily.     . Wound Cleansers (RADIAPLEX EX) Apply topically.     No current facility-administered medications for this visit.    SURGICAL HISTORY:  Past Surgical History  Procedure Laterality Date  . Cad ccta 01/31/15    . Right inguinal hernia repair at age 47    . Colonoscopy    . Cervical fusion    . Back surgery    . Video bronchoscopy with endobronchial ultrasound N/A 02/27/2015    Procedure: VIDEO BRONCHOSCOPY WITH ENDOBRONCHIAL ULTRASOUND;  Surgeon: Grace Isaac, MD;  Location: [facility];  Service: Thoracic;  Laterality: N/A;  . Lung biopsy N/A 02/27/2015    Procedure: LUNG BIOPSY;  Surgeon: Grace Isaac, MD;  Location: [facility];  Service: Thoracic;  Laterality: N/A;    REVIEW OF SYSTEMS:  A comprehensive review of systems was negative except for: Respiratory: positive for cough and dyspnea on exertion   PHYSICAL EXAMINATION: General appearance: alert, cooperative and no distress Head: Normocephalic, without obvious abnormality, atraumatic Neck: no adenopathy, no JVD, supple, symmetrical, trachea midline and thyroid not enlarged, symmetric, no tenderness/mass/nodules Lymph nodes: Cervical, supraclavicular, and axillary nodes normal. Resp: clear to auscultation bilaterally Back: symmetric, no curvature. ROM normal. No CVA tenderness. Cardio: regular rate and rhythm, S1, S2 normal, no murmur, click, rub or gallop GI: soft, non-tender; bowel sounds normal; no masses,  no organomegaly Extremities: extremities normal, atraumatic, no cyanosis or edema Neurologic: Alert and oriented X 3, normal strength and tone. Normal symmetric reflexes. Normal coordination and gait  ECOG PERFORMANCE STATUS: 2 - Symptomatic, <50% confined to bed  Blood pressure 142/74, pulse 72, temperature 98.3 F (36.8 C), temperature source Oral, resp. rate 17, height '5\' 8"'$  (1.727 m), weight 202 lb 11.2 oz (91.944 kg), SpO2 99 %.  LABORATORY DATA: Lab Results  Component Value Date   WBC 4.9 11/12/2015   HGB 13.5 11/12/2015   HCT 41.3 11/12/2015   MCV 82.5 11/12/2015   PLT 222 11/12/2015      Chemistry      Component Value Date/Time   NA 139 11/12/2015 0845   NA 136 02/27/2015 0701   K 4.4 11/12/2015 0845   K 4.3 02/27/2015 0701   CL 102 02/27/2015 0701   CO2 25 11/12/2015 0845   CO2 22 02/27/2015 0701   BUN 7.1 11/12/2015 0845   BUN 11 02/27/2015 0701   CREATININE 1.1 11/12/2015 0845   CREATININE 1.07 02/27/2015 0701      Component Value Date/Time   CALCIUM 8.8 11/12/2015 0845   CALCIUM 8.7* 02/27/2015 0701   ALKPHOS 97 11/12/2015 0845   ALKPHOS 103 02/27/2015 0701   AST 14 11/12/2015 0845   AST 28 02/27/2015 0701   ALT  10 11/12/2015 0845   ALT 17 02/27/2015 0701   BILITOT 0.55 11/12/2015 0845   BILITOT 0.7 02/27/2015 0701       RADIOGRAPHIC STUDIES: Ct Chest W Contrast  11/12/2015  CLINICAL DATA:  Right upper lobe primary lung cancer. Restaging. Diagnosed 7/16. Chemotherapy and radiation therapy completed in December. Shortness of breath for 9 months. Restaging. EXAM: CT CHEST WITH CONTRAST TECHNIQUE: Multidetector CT imaging of the chest was performed during intravenous contrast administration. CONTRAST:  62m OMNIPAQUE IOHEXOL 300 MG/ML  SOLN COMPARISON:  08/09/2015 FINDINGS: Mediastinum/Nodes: Enlarged, heterogeneous thyroid, similar. No supraclavicular adenopathy. Tortuous thoracic aorta. Normal heart size, without pericardial effusion. Multivessel coronary artery atherosclerosis. No central pulmonary embolism, on this non-dedicated  study. No mediastinal or hilar adenopathy. Lungs/Pleura: No pleural fluid.  Mild centrilobular emphysema. Minimal subpleural nodularity in the right lower lobe on image 43/series 4 is likely similar to on the prior exam, but more conspicuous today secondary to slice selection. Similarly, a 2 mm left upper lobe pulmonary nodule on image 22/series 4 is more apparent today by slice selection. Similar left lower lobe 4 mm pleural-based nodule. Extended interstitial thickening in the right apex is slightly increased. No well-defined residual or recurrent disease. Upper abdomen: Normal imaged portions of the liver, spleen, stomach, pancreas, gallbladder, right kidney. A too small to characterize interpolar left renal lesion. Left adrenal thickening and mild nodularity are similar. Right adrenal nodularity, including maximally at 1.6 cm on image 60/ series 2. Unchanged. Abdominal aortic atherosclerosis. Musculoskeletal: Lower cervical spine fixation. IMPRESSION: 1. Slight increase in presumed radiation change in the right apex. No locally recurrent or metastatic disease. 2. Scattered similar  pulmonary nodules, nonspecific. 3. Right worse than left adrenal nodularity is unchanged and again likely due to underlying adenomas. 4.  Atherosclerosis, including within the coronary arteries. Electronically Signed   By: Abigail Miyamoto M.D.   On: 11/12/2015 11:20    ASSESSMENT AND PLAN: This is a very pleasant 70 years old African-American male with a stage IIIa non-small cell lung cancer. The patient completed a course of concurrent chemoradiation with excellent response to his treatment. He underwent consolidation chemotherapy with reduced dose carboplatin and paclitaxel status post 3 cycles. The recent CT scan of the chest showed no evidence for disease progression. I discussed the scan results with the patient today. I recommended for him to continue on observation with repeat CT scan of the chest in 3 months. For smoke cessation I strongly encouraged the patient to quit smoking and he received counseling from the thoracic navigator today. He was advised to call immediately if he has any concerning symptoms in the interval. The patient voices understanding of current disease status and treatment options and is in agreement with the current care plan.  All questions were answered. The patient knows to call the clinic with any problems, questions or concerns. We can certainly see the patient much sooner if necessary.  Disclaimer: This note was dictated with voice recognition software. Similar sounding words can inadvertently be transcribed and may not be corrected upon review.

## 2015-11-14 NOTE — Telephone Encounter (Signed)
per pof to sch pt appt-gave pt copy of avs °

## 2015-11-26 ENCOUNTER — Telehealth: Payer: Self-pay

## 2015-11-26 DIAGNOSIS — N400 Enlarged prostate without lower urinary tract symptoms: Secondary | ICD-10-CM

## 2015-11-26 MED ORDER — FINASTERIDE 5 MG PO TABS: 5.0000 mg | ORAL_TABLET | Freq: Every day | ORAL | Status: DC

## 2015-11-26 NOTE — Telephone Encounter (Signed)
Pt needs an appt for further refills. 30 days were given today.

## 2015-12-28 ENCOUNTER — Emergency Department (HOSPITAL_COMMUNITY): Payer: Medicare Other

## 2015-12-28 ENCOUNTER — Encounter (HOSPITAL_COMMUNITY): Payer: Self-pay | Admitting: Emergency Medicine

## 2015-12-28 ENCOUNTER — Emergency Department (HOSPITAL_COMMUNITY)
Admission: EM | Admit: 2015-12-28 | Discharge: 2015-12-28 | Disposition: A | Discharge status: Home / Self Care | Payer: Medicare Other | Attending: Emergency Medicine | Admitting: Emergency Medicine

## 2015-12-28 DIAGNOSIS — F17210 Nicotine dependence, cigarettes, uncomplicated: Secondary | ICD-10-CM | POA: Diagnosis not present

## 2015-12-28 DIAGNOSIS — E785 Hyperlipidemia, unspecified: Secondary | ICD-10-CM | POA: Diagnosis not present

## 2015-12-28 DIAGNOSIS — Z79891 Long term (current) use of opiate analgesic: Secondary | ICD-10-CM | POA: Insufficient documentation

## 2015-12-28 DIAGNOSIS — Z79899 Other long term (current) drug therapy: Secondary | ICD-10-CM | POA: Diagnosis not present

## 2015-12-28 DIAGNOSIS — Z7982 Long term (current) use of aspirin: Secondary | ICD-10-CM | POA: Diagnosis not present

## 2015-12-28 DIAGNOSIS — I110 Hypertensive heart disease with heart failure: Secondary | ICD-10-CM | POA: Insufficient documentation

## 2015-12-28 DIAGNOSIS — M25552 Pain in left hip: Secondary | ICD-10-CM | POA: Diagnosis not present

## 2015-12-28 DIAGNOSIS — M62830 Muscle spasm of back: Secondary | ICD-10-CM | POA: Insufficient documentation

## 2015-12-28 DIAGNOSIS — J449 Chronic obstructive pulmonary disease, unspecified: Secondary | ICD-10-CM | POA: Insufficient documentation

## 2015-12-28 DIAGNOSIS — I2510 Atherosclerotic heart disease of native coronary artery without angina pectoris: Secondary | ICD-10-CM | POA: Insufficient documentation

## 2015-12-28 DIAGNOSIS — I509 Heart failure, unspecified: Secondary | ICD-10-CM | POA: Diagnosis not present

## 2015-12-28 DIAGNOSIS — M25559 Pain in unspecified hip: Secondary | ICD-10-CM

## 2015-12-28 DIAGNOSIS — M79662 Pain in left lower leg: Secondary | ICD-10-CM | POA: Diagnosis present

## 2015-12-28 MED ORDER — DEXTROSE 5 % IV SOLN
1000.0000 mg | Freq: Once | INTRAVENOUS | Status: AC
  Administered 2015-12-28: 1000 mg via INTRAVENOUS
  Filled 2015-12-28: qty 10

## 2015-12-28 MED ORDER — OXYCODONE-ACETAMINOPHEN 5-325 MG PO TABS
1.0000 | ORAL_TABLET | ORAL | Status: DC | PRN
Start: 2015-12-28 — End: 2016-06-23

## 2015-12-28 MED ORDER — ONDANSETRON 4 MG PO TBDP
4.0000 mg | ORAL_TABLET | Freq: Once | ORAL | Status: AC
  Administered 2015-12-28: 4 mg via ORAL
  Filled 2015-12-28: qty 1

## 2015-12-28 MED ORDER — KETOROLAC TROMETHAMINE 30 MG/ML IJ SOLN
30.0000 mg | Freq: Once | INTRAMUSCULAR | Status: AC
  Administered 2015-12-28: 30 mg via INTRAVENOUS
  Filled 2015-12-28: qty 1

## 2015-12-28 MED ORDER — METHOCARBAMOL 1000 MG/10ML IJ SOLN: 1000.0000 mg | Freq: Once | INTRAMUSCULAR | Status: DC

## 2015-12-28 MED ORDER — CYCLOBENZAPRINE HCL 10 MG PO TABS: 10.0000 mg | ORAL_TABLET | Freq: Two times a day (BID) | ORAL | Status: DC | PRN

## 2015-12-28 MED ORDER — OXYCODONE-ACETAMINOPHEN 5-325 MG PO TABS
2.0000 | ORAL_TABLET | Freq: Once | ORAL | Status: AC
  Administered 2015-12-28: 2 via ORAL
  Filled 2015-12-28: qty 2

## 2015-12-28 NOTE — ED Provider Notes (Signed)
CSN: [id number]     Arrival date & time 12/28/15  0413 History   First MD Initiated Contact with Patient 12/28/15 [phone]     Chief Complaint  Patient presents with  . Leg Pain     (Consider location/radiation/quality/duration/timing/severity/associated sxs/prior Treatment) HPI  70 year old male with a past medical history of CHF, nicotine dependence, COPD, lung mass, history of spinal surgery who presents emergency Department with chief complaint of left lower back and leg pain. Patient states that 3 days ago with lifting a window that was stuck. Denies any severe pain at that time. Did before yesterday. Patient fell forward landing on both knees, but denied severe pain at that time. However, that evening he began having severe pain in his left back and hip pain. He states that over the past 24 hours his pain has become severe. He states that he is unable to ambulate on that leg or move the hip without severe pain. He denies any bowel incontinence, bladder incontinence, new paresthesia, no weakness. He denies any urinary symptoms. He denies fevers or chills.    Past Medical History  Diagnosis Date  . CHF (congestive heart failure) ([place])   . Hypertension   . Hyperlipidemia   . BPH (benign prostatic hyperplasia)   . CAD (coronary artery disease)   . Chronic cardiopulmonary disease ([place])   . Nicotine dependence   . Mass of lung   . COPD (chronic obstructive pulmonary disease) ([place])   . Shortness of breath dyspnea     with exertion  . Anxiety     situational  . Personal history of kidney stones   . Restless leg    Past Surgical History  Procedure Laterality Date  . Cad ccta 01/31/15    . Right inguinal hernia repair at age 44    . Colonoscopy    . Cervical fusion    . Back surgery    . Video bronchoscopy with endobronchial ultrasound N/A 02/27/2015    Procedure: VIDEO BRONCHOSCOPY WITH ENDOBRONCHIAL ULTRASOUND;  Surgeon: Grace Isaac, MD;  Location: [facility] OR;  Service: Thoracic;   Laterality: N/A;  . Lung biopsy N/A 02/27/2015    Procedure: LUNG BIOPSY;  Surgeon: Grace Isaac, MD;  Location: [facility] OR;  Service: Thoracic;  Laterality: N/A;   Family History  Problem Relation Age of Onset  . Cancer    . Heart disease Father   . Hypertension     Social History  Substance Use Topics  . Smoking status: Current Every Day Smoker -- 1.50 packs/day for 47 years    Types: Cigarettes    Start date: 02/14/1967  . Smokeless tobacco: Never Used  . Alcohol Use: 0.0 oz/week    0 Standard drinks or equivalent per week     Comment: ocassional    Review of Systems  Ten systems reviewed and are negative for acute change, except as noted in the HPI.    Allergies  Review of patient's allergies indicates no known allergies.  Home Medications   Prior to Admission medications   Medication Sig Start Date End Date Taking? Authorizing Provider  acetaminophen (TYLENOL) 500 MG tablet Take 1,000 mg by mouth every 6 (six) hours as needed for mild pain, moderate pain, fever or headache.   Yes Historical Provider, MD  amLODipine (NORVASC) 5 MG tablet Take 5 mg by mouth daily. 07/16/15  Yes Historical Provider, MD  aspirin 81 MG tablet Take 81 mg by mouth daily.   Yes Historical Provider, MD  atorvastatin (  LIPITOR) 40 MG tablet Take 40 mg by mouth daily.  05/10/15  Yes Historical Provider, MD  cetirizine (ZYRTEC) 10 MG tablet Take 10 mg by mouth daily as needed for allergies.   Yes Historical Provider, MD  Cholecalciferol (VITAMIN D3) 5000 UNITS TABS Take 5,000 Units by mouth daily.   Yes Historical Provider, MD  finasteride (PROSCAR) 5 MG tablet Take 1 tablet (5 mg total) by mouth daily. 11/26/15  Yes Shannon A McGowan, PA-C  fluticasone (FLONASE) 50 MCG/ACT nasal spray Place 2 sprays into both nostrils daily as needed for allergies.  04/02/15  Yes Historical Provider, MD  losartan (COZAAR) 50 MG tablet Take 50 mg by mouth daily. 07/16/15  Yes Historical Provider, MD  ondansetron  (ZOFRAN-ODT) 4 MG disintegrating tablet Take 4 mg by mouth every 6 (six) hours as needed. 12/24/15  Yes Historical Provider, MD  oxyCODONE-acetaminophen (PERCOCET/ROXICET) 5-325 MG tablet Take 1 tablet by mouth every 4 (four) hours as needed for moderate pain or severe pain.   Yes Historical Provider, MD  PROAIR HFA 108 (90 BASE) MCG/ACT inhaler Inhale 2 puffs into the lungs every 4 (four) hours as needed for wheezing or shortness of breath.  12/31/14  Yes Historical Provider, MD  tamsulosin (FLOMAX) 0.4 MG CAPS capsule Take 0.4 mg by mouth daily.  02/02/15  Yes Historical Provider, MD   BP 157/101 mmHg  Pulse 84  Temp(Src) 98.4 F (36.9 C) (Oral)  Resp 20  SpO2 97% Physical Exam  Constitutional: He appears well-developed and well-nourished. No distress.  HENT:  Head: Normocephalic and atraumatic.  Eyes: Conjunctivae are normal. No scleral icterus.  Neck: Normal range of motion. Neck supple.  Cardiovascular: Normal rate, regular rhythm and normal heart sounds.   Pulmonary/Chest: Effort normal and breath sounds normal. No respiratory distress.  Abdominal: Soft. Bowel sounds are normal. There is no tenderness.  Musculoskeletal: He exhibits no edema.  TTP Lumbar paraspinals. Normal DTRs. No pain with IR/ER of hip. Strength is normal.  Neurological: He is alert.  Skin: Skin is warm and dry. He is not diaphoretic.  Psychiatric: His behavior is normal.  Nursing note and vitals reviewed.   ED Course  Procedures (including critical care time) Labs Review Labs Reviewed - No data to display  Imaging Review No results found. I have personally reviewed and evaluated these images and lab results as part of my medical decision-making.   EKG Interpretation None      MDM   Final diagnoses:  Back muscle spasm  Hip pain, left   Imaging is negative. Patient with back pain.  No neurological deficits and normal neuro exam.  Patient can walk but states is painful.  No loss of bowel or bladder  control.  No concern for cauda equina.  No fever, night sweats, weight loss, h/o cancer, IVDU.  RICE protocol and pain medicine indicated and discussed with patient.      Margarita Mail, PA-C 76/16/07 [id number]  Delora Fuel, MD 62/69/48 [id number]

## 2015-12-28 NOTE — ED Notes (Signed)
Per EMS, pt. From home with complaint of left leg pain x 2 days after lifting windows. Pt. Denied fall, pt. Had hx of back surgery .

## 2015-12-28 NOTE — Discharge Instructions (Signed)
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 No other abnormalities were seen. We will treat your pain with strong pain medicine. Do not take tylenol with this medication.; do not drive or drink alcohol with this medication.   Back Injury Prevention Back injuries can be very painful. They can also be difficult to heal. After having one back injury, you are more likely to injure your back again. It is important to learn how to avoid injuring or re-injuring your back. The following tips can help you to prevent a back injury. WHAT SHOULD I KNOW ABOUT PHYSICAL FITNESS?  Exercise for 30 minutes per day on most days of the week or as directed by your health care provider. Make sure to:  Do aerobic exercises, such as walking, jogging, biking, or swimming.  Do exercises that increase balance and strength, such as tai chi and yoga. These can decrease your risk of falling and injuring your back.  Do stretching exercises to help with flexibility.  Try to develop strong abdominal muscles. Your abdominal muscles provide a lot of the support that is needed by your back.  Maintain a healthy weight. This helps to decrease your risk of a back injury. WHAT SHOULD I KNOW ABOUT MY DIET?  Talk with your health care provider about your overall diet. Take supplements and vitamins only as directed by your health care provider.  Talk with your health care provider about how much calcium and vitamin D you need each day. These nutrients help to prevent weakening of the bones (osteoporosis). Osteoporosis can cause broken (fractured) bones, which lead to back pain.  Include good sources of calcium in your diet, such as dairy products, green leafy vegetables, and products that have had calcium added to them (fortified).  Include good sources of vitamin D in your diet, such as milk and foods that are fortified with vitamin D. WHAT SHOULD I KNOW ABOUT MY POSTURE?  Sit up straight and stand up straight. Avoid  leaning forward when you sit or hunching over when you stand.  Choose chairs that have good low-back (lumbar) support.  If you work at a desk, sit close to it so you do not need to lean over. Keep your chin tucked in. Keep your neck drawn back, and keep your elbows bent at a right angle. Your arms should look like the letter "L."  Sit high and close to the steering wheel when you drive. Add a lumbar support to your car seat, if needed.  Avoid sitting or standing in one position for very long. Take breaks to get up, stretch, and walk around at least one time every hour. Take breaks every hour if you are driving for long periods of time.  Sleep on your side with your knees slightly bent, or sleep on your back with a pillow under your knees. Do not lie on the front of your body to sleep. WHAT SHOULD I KNOW ABOUT LIFTING, TWISTING, AND REACHING? Lifting and Heavy Lifting  Avoid heavy lifting, especially repetitive heavy lifting. If you must do heavy lifting:  Stretch before lifting.  Work slowly.  Rest between lifts.  Use a tool such as a cart or a dolly to move objects if one is available.  Make several small trips instead of carrying one heavy load.  Ask for help when you need it, especially when moving big objects.  Follow these steps when lifting:  Stand with your feet shoulder-width apart.  Get as close to the object as  you can. Do not try to pick up a heavy object that is far from your body. °· Use handles or lifting straps if they are available. °· Bend at your knees. Squat down, but keep your heels off the floor. °· Keep your shoulders pulled back, your chin tucked in, and your back straight. °· Lift the object slowly while you tighten the muscles in your legs, abdomen, and buttocks. Keep the object as close to the center of your body as possible. °· Follow these steps when putting down a heavy load: °· Stand with your feet shoulder-width apart. °· Lower the object slowly while  you tighten the muscles in your legs, abdomen, and buttocks. Keep the object as close to the center of your body as possible. °· Keep your shoulders pulled back, your chin tucked in, and your back straight. °· Bend at your knees. Squat down, but keep your heels off the floor. °· Use handles or lifting straps if they are available. °Twisting and Reaching °· Avoid lifting heavy objects above your waist. °· Do not twist at your waist while you are lifting or carrying a load. If you need to turn, move your feet. °· Do not bend over without bending at your knees. °· Avoid reaching over your head, across a table, or for an object on a high surface. °WHAT ARE SOME OTHER TIPS? °· Avoid wet floors and icy ground. Keep sidewalks clear of ice to prevent falls. °· Do not sleep on a mattress that is too soft or too hard. °· Keep items that are used frequently within easy reach. °· Put heavier objects on shelves at waist level, and put lighter objects on lower or higher shelves. °· Find ways to decrease your stress, such as exercise, massage, or relaxation techniques. Stress can build up in your muscles. Tense muscles are more vulnerable to injury. °· Talk with your health care provider if you feel anxious or depressed. These conditions can make back pain worse. °· Wear flat heel shoes with cushioned soles. °· Avoid sudden movements. °· Use both shoulder straps when carrying a backpack. °· Do not use any tobacco products, including cigarettes, chewing tobacco, or electronic cigarettes. If you need help quitting, ask your health care provider. °  °This information is not intended to replace advice given to you by your health care provider. Make sure you discuss any questions you have with your health care provider. °  °Document Released: 10/01/2004 Document Revised: 01/08/2015 Document Reviewed: 08/28/2014 °Elsevier Interactive Patient Education ©2016 [employer] Inc[employer] ° °Hip Pain °Your hip is the joint between your upper legs and  your lower pelvis. The bones, cartilage, tendons, and muscles of your hip joint perform a lot of work each day supporting your body weight and allowing you to move around. °Hip pain can range from a minor ache to severe pain in one or both of your hips. Pain may be felt on the inside of the hip joint near the groin, or the outside near the buttocks and upper thigh. You may have swelling or stiffness as well.  °HOME CARE INSTRUCTIONS  °· Take medicines only as directed by your health care provider. °· Apply ice to the injured area: °¨ Put ice in a plastic bag. °¨ Place a towel between your skin and the bag. °¨ Leave the ice on for 15-20 minutes at a time, 3-4 times a day. °· Keep your leg raised (elevated) when possible to lessen swelling. °· Avoid activities that cause pain. °·   Follow specific exercises as directed by your health care provider. °· Sleep with a pillow between your legs on your most comfortable side. °· Record how often you have hip pain, the location of the pain, and what it feels like. °SEEK MEDICAL CARE IF:  °· You are unable to put weight on your leg. °· Your hip is red or swollen or very tender to touch. °· Your pain or swelling continues or worsens after 1 week. °· You have increasing difficulty walking. °· You have a fever. °SEEK IMMEDIATE MEDICAL CARE IF:  °· You have fallen. °· You have a sudden increase in pain and swelling in your hip. °MAKE SURE YOU:  °· Understand these instructions. °· Will watch your condition. °· Will get help right away if you are not doing well or get worse. °  °This information is not intended to replace advice given to you by your health care provider. Make sure you discuss any questions you have with your health care provider. °  °Document Released: 02/11/2010 Document Revised: 09/14/2014 Document Reviewed: 04/20/2013 °Elsevier Interactive Patient Education ©2016 Elsevier Inc. ° ° °

## 2015-12-28 NOTE — ED Notes (Signed)
Bed: WA09 Expected date:  Expected time:  Means of arrival:  Comments: 70 yo M  Muscle spasms

## 2016-02-12 ENCOUNTER — Ambulatory Visit (HOSPITAL_COMMUNITY)
Admission: RE | Admit: 2016-02-12 | Discharge: 2016-02-12 | Disposition: A | Discharge status: Home / Self Care | Payer: Medicare Other | Source: Ambulatory Visit | Attending: Internal Medicine | Admitting: Internal Medicine

## 2016-02-12 ENCOUNTER — Encounter (HOSPITAL_COMMUNITY): Payer: Self-pay

## 2016-02-12 ENCOUNTER — Other Ambulatory Visit (HOSPITAL_BASED_OUTPATIENT_CLINIC_OR_DEPARTMENT_OTHER): Payer: Medicare Other

## 2016-02-12 DIAGNOSIS — C3411 Malignant neoplasm of upper lobe, right bronchus or lung: Secondary | ICD-10-CM | POA: Insufficient documentation

## 2016-02-12 DIAGNOSIS — Y842 Radiological procedure and radiotherapy as the cause of abnormal reaction of the patient, or of later complication, without mention of misadventure at the time of the procedure: Secondary | ICD-10-CM | POA: Diagnosis not present

## 2016-02-12 DIAGNOSIS — R918 Other nonspecific abnormal finding of lung field: Secondary | ICD-10-CM | POA: Insufficient documentation

## 2016-02-12 DIAGNOSIS — I7090 Unspecified atherosclerosis: Secondary | ICD-10-CM | POA: Insufficient documentation

## 2016-02-12 DIAGNOSIS — E279 Disorder of adrenal gland, unspecified: Secondary | ICD-10-CM | POA: Insufficient documentation

## 2016-02-12 DIAGNOSIS — I2510 Atherosclerotic heart disease of native coronary artery without angina pectoris: Secondary | ICD-10-CM | POA: Diagnosis not present

## 2016-02-12 DIAGNOSIS — J432 Centrilobular emphysema: Secondary | ICD-10-CM | POA: Diagnosis not present

## 2016-02-12 HISTORY — DX: Malignant (primary) neoplasm, unspecified: C80.1

## 2016-02-12 LAB — COMPREHENSIVE METABOLIC PANEL
ALK PHOS: 89 U/L (ref 40–150)
ALT: 55 U/L (ref 0–55)
ANION GAP: 9 meq/L (ref 3–11)
AST: 37 U/L — ABNORMAL HIGH (ref 5–34)
Albumin: 3.7 g/dL (ref 3.5–5.0)
BUN: 11.6 mg/dL (ref 7.0–26.0)
CALCIUM: 9.3 mg/dL (ref 8.4–10.4)
CHLORIDE: 107 meq/L (ref 98–109)
CO2: 24 mEq/L (ref 22–29)
Creatinine: 1.1 mg/dL (ref 0.7–1.3)
EGFR: 78 mL/min/{1.73_m2} — AB (ref >90)
Glucose: 93 mg/dl (ref 70–140)
POTASSIUM: 4 meq/L (ref 3.5–5.1)
Sodium: 140 mEq/L (ref 136–145)
Total Bilirubin: 0.47 mg/dL (ref 0.20–1.20)
Total Protein: 7.1 g/dL (ref 6.4–8.3)

## 2016-02-12 LAB — CBC WITH DIFFERENTIAL/PLATELET
BASO%: 0.6 % (ref 0.0–2.0)
BASOS ABS: 0 10*3/uL (ref 0.0–0.1)
EOS%: 2.6 % (ref 0.0–7.0)
Eosinophils Absolute: 0.1 10*3/uL (ref 0.0–0.5)
HEMATOCRIT: 43.8 % (ref 38.4–49.9)
HGB: 14.3 g/dL (ref 13.0–17.1)
LYMPH#: 1.1 10*3/uL (ref 0.9–3.3)
LYMPH%: 20.1 % (ref 14.0–49.0)
MCH: 26.9 pg — AB (ref 27.2–33.4)
MCHC: 32.7 g/dL (ref 32.0–36.0)
MCV: 82.2 fL (ref 79.3–98.0)
MONO#: 0.4 10*3/uL (ref 0.1–0.9)
MONO%: 8.1 % (ref 0.0–14.0)
NEUT#: 3.8 10*3/uL (ref 1.5–6.5)
NEUT%: 68.6 % (ref 39.0–75.0)
PLATELETS: 245 10*3/uL (ref 140–400)
RBC: 5.32 10*6/uL (ref 4.20–5.82)
RDW: 16.3 % — ABNORMAL HIGH (ref 11.0–14.6)
WBC: 5.5 10*3/uL (ref 4.0–10.3)

## 2016-02-12 MED ORDER — IOPAMIDOL (ISOVUE-300) INJECTION 61%
75.0000 mL | Freq: Once | INTRAVENOUS | Status: AC | PRN
  Administered 2016-02-12: 75 mL via INTRAVENOUS

## 2016-02-17 ENCOUNTER — Encounter: Payer: Self-pay | Admitting: Internal Medicine

## 2016-02-17 ENCOUNTER — Encounter: Payer: Self-pay | Admitting: *Deleted

## 2016-02-17 ENCOUNTER — Telehealth: Payer: Self-pay | Admitting: Internal Medicine

## 2016-02-17 ENCOUNTER — Ambulatory Visit (HOSPITAL_BASED_OUTPATIENT_CLINIC_OR_DEPARTMENT_OTHER): Payer: Medicare Other | Admitting: Internal Medicine

## 2016-02-17 VITALS — BP 151/79 | HR 94 | Temp 98.2°F | Resp 18 | Wt 203.8 lb

## 2016-02-17 DIAGNOSIS — C3411 Malignant neoplasm of upper lobe, right bronchus or lung: Secondary | ICD-10-CM | POA: Diagnosis present

## 2016-02-17 DIAGNOSIS — Z720 Tobacco use: Secondary | ICD-10-CM | POA: Diagnosis not present

## 2016-02-17 NOTE — Progress Notes (Signed)
[facility] Telephone:([phone]   Fax:([phone]) [phone]  OFFICE PROGRESS NOTE  [name], [place] [address]  DIAGNOSIS: Stage IIIA (T3, N2, M0) non-small cell lung cancer, squamous cell carcinoma diagnosed in June 2016.  PRIOR THERAPY:  1) Concurrent chemoradiation with weekly carboplatin for an AUC of 2 and paclitaxel 45 mg/m2 with partial response.  2) Consolidation systemic chemotherapy with carboplatin for AUC of 5 and paclitaxel 175 MG/M2 every 3 weeks. First cycle 06/10/2015. Status post 3 cycles.  CURRENT THERAPY: Observation.  INTERVAL HISTORY: Colton Nguyen 70 y.o. male returns to the clinic today for follow-up visit. He has been observation for the last 6 months. The patient is feeling fine today with no specific complaints. He denied having any significant nausea or vomiting, no fever or chills. He denied having any significant weight loss or night sweats. He has no dysphagia or odynophagia. The patient denied having any significant chest pain, but continues to have shortness of breath with exertion with mild cough with no hemoptysis. The patient had repeat CT scan of the chest performed recently and he is here for evaluation and discussion of his scan results. Unfortunately he continues to smoke one pack of cigarettes every day.  MEDICAL HISTORY: Past Medical History  Diagnosis Date  . CHF (congestive heart failure) ([place])   . Hypertension   . Hyperlipidemia   . BPH (benign prostatic hyperplasia)   . CAD (coronary artery disease)   . Chronic cardiopulmonary disease ([place])   . Nicotine dependence   . Mass of lung   . COPD (chronic obstructive pulmonary disease) ([place])   . Shortness of breath dyspnea     with exertion  . Anxiety     situational  . Personal history of kidney stones   . Restless leg   . lung ca dx'd 03/2015    ALLERGIES:  has No Known Allergies.  MEDICATIONS:  Current Outpatient Prescriptions    Medication Sig Dispense Refill  . amLODipine (NORVASC) 5 MG tablet Take 5 mg by mouth daily.    Marland Kitchen aspirin 81 MG tablet Take 81 mg by mouth daily.    Marland Kitchen atorvastatin (LIPITOR) 40 MG tablet Take 40 mg by mouth daily.     . cetirizine (ZYRTEC) 10 MG tablet Take 10 mg by mouth daily as needed for allergies.    . Cholecalciferol (VITAMIN D3) 5000 UNITS TABS Take 5,000 Units by mouth daily.    . cyclobenzaprine (FLEXERIL) 10 MG tablet Take 1 tablet (10 mg total) by mouth 2 (two) times daily as needed for muscle spasms. 20 tablet 0  . finasteride (PROSCAR) 5 MG tablet Take 1 tablet (5 mg total) by mouth daily. 30 tablet 0  . fluticasone (FLONASE) 50 MCG/ACT nasal spray Place 2 sprays into both nostrils daily as needed for allergies.     Marland Kitchen losartan (COZAAR) 50 MG tablet Take 50 mg by mouth daily.    . ondansetron (ZOFRAN-ODT) 4 MG disintegrating tablet Take 4 mg by mouth every 6 (six) hours as needed.    Marland Kitchen oxyCODONE-acetaminophen (PERCOCET) 5-325 MG tablet Take 1-2 tablets by mouth every 4 (four) hours as needed. 20 tablet 0  . PROAIR HFA 108 (90 BASE) MCG/ACT inhaler Inhale 2 puffs into the lungs every 4 (four) hours as needed for wheezing or shortness of breath.     . tamsulosin (FLOMAX) 0.4 MG CAPS capsule Take 0.4 mg by mouth daily.      No  current facility-administered medications for this visit.    SURGICAL HISTORY:  Past Surgical History  Procedure Laterality Date  . Cad ccta 01/31/15    . Right inguinal hernia repair at age 48    . Colonoscopy    . Cervical fusion    . Back surgery    . Video bronchoscopy with endobronchial ultrasound N/A 02/27/2015    Procedure: VIDEO BRONCHOSCOPY WITH ENDOBRONCHIAL ULTRASOUND;  Surgeon: Grace Isaac, MD;  Location: [facility];  Service: Thoracic;  Laterality: N/A;  . Lung biopsy N/A 02/27/2015    Procedure: LUNG BIOPSY;  Surgeon: Grace Isaac, MD;  Location: [facility];  Service: Thoracic;  Laterality: N/A;    REVIEW OF SYSTEMS:  A comprehensive review  of systems was negative except for: Respiratory: positive for cough and dyspnea on exertion   PHYSICAL EXAMINATION: General appearance: alert, cooperative and no distress Head: Normocephalic, without obvious abnormality, atraumatic Neck: no adenopathy, no JVD, supple, symmetrical, trachea midline and thyroid not enlarged, symmetric, no tenderness/mass/nodules Lymph nodes: Cervical, supraclavicular, and axillary nodes normal. Resp: clear to auscultation bilaterally Back: symmetric, no curvature. ROM normal. No CVA tenderness. Cardio: regular rate and rhythm, S1, S2 normal, no murmur, click, rub or gallop GI: soft, non-tender; bowel sounds normal; no masses,  no organomegaly Extremities: extremities normal, atraumatic, no cyanosis or edema Neurologic: Alert and oriented X 3, normal strength and tone. Normal symmetric reflexes. Normal coordination and gait  ECOG PERFORMANCE STATUS: 1 - Symptomatic but completely ambulatory  There were no vitals taken for this visit.  LABORATORY DATA: Lab Results  Component Value Date   WBC 5.5 02/12/2016   HGB 14.3 02/12/2016   HCT 43.8 02/12/2016   MCV 82.2 02/12/2016   PLT 245 02/12/2016      Chemistry      Component Value Date/Time   NA 140 02/12/2016 0852   NA 136 02/27/2015 0701   K 4.0 02/12/2016 0852   K 4.3 02/27/2015 0701   CL 102 02/27/2015 0701   CO2 24 02/12/2016 0852   CO2 22 02/27/2015 0701   BUN 11.6 02/12/2016 0852   BUN 11 02/27/2015 0701   CREATININE 1.1 02/12/2016 0852   CREATININE 1.07 02/27/2015 0701      Component Value Date/Time   CALCIUM 9.3 02/12/2016 0852   CALCIUM 8.7* 02/27/2015 0701   ALKPHOS 89 02/12/2016 0852   ALKPHOS 103 02/27/2015 0701   AST 37* 02/12/2016 0852   AST 28 02/27/2015 0701   ALT 55 02/12/2016 0852   ALT 17 02/27/2015 0701   BILITOT 0.47 02/12/2016 0852   BILITOT 0.7 02/27/2015 0701       RADIOGRAPHIC STUDIES: Ct Chest W Contrast  02/12/2016  CLINICAL DATA:  Lung cancer diagnosed  7/16. Chemotherapy and radiation therapy complete. Right upper lobe primary. Restaging. EXAM: CT CHEST WITH CONTRAST TECHNIQUE: Multidetector CT imaging of the chest was performed during intravenous contrast administration. CONTRAST:  52m ISOVUE-300 IOPAMIDOL (ISOVUE-300) INJECTION 61% COMPARISON:  11/12/2015. FINDINGS: Mediastinum/Nodes: No supraclavicular adenopathy. Similar diffuse thyroid enlargement with multiple small hypo attenuating nodules within. Aortic and branch vessel atherosclerosis. Normal heart size, without pericardial effusion. Multivessel coronary artery atherosclerosis. No central pulmonary embolism, on this non-dedicated study. No mediastinal or hilar adenopathy. Lungs/Pleura: No pleural fluid. Right lower lobe subpleural pulmonary nodules x2 on the order of 2-3 mm are unchanged including on image 99/series 5. More lateral right lower lobe 3 mm subpleural nodule is similar on image 69/series 5 Scattered subpleural nodular densities in the left upper  lobe including at 2-3 mm on image 46/ series 5, similar. Left lower lobe subpleural 5 mm nodule is unchanged Right apical radiation fibrosis is slightly more confluent today. No well-defined residual or recurrent disease. Upper abdomen: Normal imaged portions of the liver, spleen, stomach, pancreas, gallbladder, kidneys. Similar right worse than left adrenal nodularity and thickening. Index adrenal nodularity measures 1.6 cm on image 142/series 2, unchanged. Musculoskeletal: No acute osseous abnormality. IMPRESSION: 1. Right apical radiation change, without locally recurrent or residual disease. 2. No thoracic adenopathy. 3. Centrilobular emphysema with similar bilateral pulmonary nodules, favored to represent subpleural lymph nodes. 4.  Atherosclerosis, including within the coronary arteries. 5. Similar bilateral adrenal thickening and nodularity, favoring benign adenomas. Electronically Signed   By: Abigail Miyamoto M.D.   On: 02/12/2016 11:13     ASSESSMENT AND PLAN: This is a very pleasant 70 years old African-American male with a stage IIIa non-small cell lung cancer. The patient completed a course of concurrent chemoradiation with excellent response to his treatment. He underwent consolidation chemotherapy with reduced dose carboplatin and paclitaxel status post 3 cycles. His CT scan of the chest performed recently showed no evidence for disease progression. I discussed the scan results with the patient today. I recommended for him to continue on observation with repeat CT scan of the chest in 4 months. For smoke cessation I strongly encouraged the patient to quit smoking and he received counseling from the thoracic navigator today. He was advised to call immediately if he has any concerning symptoms in the interval. The patient voices understanding of current disease status and treatment options and is in agreement with the current care plan.  All questions were answered. The patient knows to call the clinic with any problems, questions or concerns. We can certainly see the patient much sooner if necessary.  Disclaimer: This note was dictated with voice recognition software. Similar sounding words can inadvertently be transcribed and may not be corrected upon review.

## 2016-02-17 NOTE — Telephone Encounter (Signed)
per pof to sch pt ppt-gave pt copyof avs

## 2016-02-17 NOTE — Progress Notes (Signed)
Oncology Nurse Navigator Documentation  Oncology Nurse Navigator Flowsheets 02/17/2016  Navigator Encounter Type Clinic/MDC  Patient Visit Type Follow-up;MedOnc  Treatment Phase Other  Barriers/Navigation Needs Education  Education Smoking cessation  Interventions Education Method  Education Method Verbal;Written  Acuity Level 1  Time Spent with Patient 30   Spoke with patient today at [facility].  We went over smoking cessation information.  He stated he is not ready to quit but has cut back.  I stated I am here to help and gave him information on the benefits of quitting.

## 2016-04-13 ENCOUNTER — Encounter: Payer: Self-pay | Admitting: Internal Medicine

## 2016-06-16 ENCOUNTER — Other Ambulatory Visit (HOSPITAL_BASED_OUTPATIENT_CLINIC_OR_DEPARTMENT_OTHER): Payer: Medicare Other

## 2016-06-16 ENCOUNTER — Ambulatory Visit (HOSPITAL_COMMUNITY)
Admission: RE | Admit: 2016-06-16 | Discharge: 2016-06-16 | Disposition: A | Discharge status: Home / Self Care | Payer: Medicare Other | Source: Ambulatory Visit | Attending: Internal Medicine | Admitting: Internal Medicine

## 2016-06-16 ENCOUNTER — Encounter (HOSPITAL_COMMUNITY): Payer: Self-pay

## 2016-06-16 DIAGNOSIS — K760 Fatty (change of) liver, not elsewhere classified: Secondary | ICD-10-CM | POA: Insufficient documentation

## 2016-06-16 DIAGNOSIS — C3411 Malignant neoplasm of upper lobe, right bronchus or lung: Secondary | ICD-10-CM | POA: Insufficient documentation

## 2016-06-16 DIAGNOSIS — E278 Other specified disorders of adrenal gland: Secondary | ICD-10-CM | POA: Insufficient documentation

## 2016-06-16 DIAGNOSIS — I2510 Atherosclerotic heart disease of native coronary artery without angina pectoris: Secondary | ICD-10-CM | POA: Diagnosis not present

## 2016-06-16 DIAGNOSIS — J439 Emphysema, unspecified: Secondary | ICD-10-CM | POA: Diagnosis not present

## 2016-06-16 DIAGNOSIS — I700 Atherosclerosis of aorta: Secondary | ICD-10-CM | POA: Diagnosis not present

## 2016-06-16 LAB — CBC WITH DIFFERENTIAL/PLATELET
BASO%: 0.6 % (ref 0.0–2.0)
Basophils Absolute: 0 10*3/uL (ref 0.0–0.1)
EOS%: 3.3 % (ref 0.0–7.0)
Eosinophils Absolute: 0.2 10*3/uL (ref 0.0–0.5)
HCT: 43.6 % (ref 38.4–49.9)
HEMOGLOBIN: 14.3 g/dL (ref 13.0–17.1)
LYMPH%: 21.9 % (ref 14.0–49.0)
MCH: 26.7 pg — ABNORMAL LOW (ref 27.2–33.4)
MCHC: 32.7 g/dL (ref 32.0–36.0)
MCV: 81.8 fL (ref 79.3–98.0)
MONO#: 0.4 10*3/uL (ref 0.1–0.9)
MONO%: 8.1 % (ref 0.0–14.0)
NEUT%: 66.1 % (ref 39.0–75.0)
NEUTROS ABS: 3.5 10*3/uL (ref 1.5–6.5)
Platelets: 224 10*3/uL (ref 140–400)
RBC: 5.33 10*6/uL (ref 4.20–5.82)
RDW: 15.6 % — AB (ref 11.0–14.6)
WBC: 5.3 10*3/uL (ref 4.0–10.3)
lymph#: 1.2 10*3/uL (ref 0.9–3.3)

## 2016-06-16 LAB — COMPREHENSIVE METABOLIC PANEL
ALK PHOS: 111 U/L (ref 40–150)
ALT: 11 U/L (ref 0–55)
ANION GAP: 11 meq/L (ref 3–11)
AST: 16 U/L (ref 5–34)
Albumin: 3.4 g/dL — ABNORMAL LOW (ref 3.5–5.0)
BUN: 7.6 mg/dL (ref 7.0–26.0)
CALCIUM: 9.2 mg/dL (ref 8.4–10.4)
CO2: 25 meq/L (ref 22–29)
CREATININE: 1.1 mg/dL (ref 0.7–1.3)
Chloride: 104 mEq/L (ref 98–109)
EGFR: 76 mL/min/{1.73_m2} — ABNORMAL LOW (ref >90)
Glucose: 98 mg/dl (ref 70–140)
Potassium: 4.2 mEq/L (ref 3.5–5.1)
Sodium: 140 mEq/L (ref 136–145)
TOTAL PROTEIN: 6.9 g/dL (ref 6.4–8.3)
Total Bilirubin: 0.47 mg/dL (ref 0.20–1.20)

## 2016-06-16 MED ORDER — IOPAMIDOL (ISOVUE-300) INJECTION 61%
75.0000 mL | Freq: Once | INTRAVENOUS | Status: AC | PRN
  Administered 2016-06-16: 75 mL via INTRAVENOUS

## 2016-06-23 ENCOUNTER — Telehealth: Payer: Self-pay | Admitting: Internal Medicine

## 2016-06-23 ENCOUNTER — Ambulatory Visit (HOSPITAL_BASED_OUTPATIENT_CLINIC_OR_DEPARTMENT_OTHER): Payer: Medicare Other | Admitting: Internal Medicine

## 2016-06-23 ENCOUNTER — Encounter: Payer: Self-pay | Admitting: Internal Medicine

## 2016-06-23 VITALS — BP 154/80 | HR 83 | Temp 98.6°F | Resp 16 | Ht 68.0 in | Wt 205.7 lb

## 2016-06-23 DIAGNOSIS — C3411 Malignant neoplasm of upper lobe, right bronchus or lung: Secondary | ICD-10-CM

## 2016-06-23 DIAGNOSIS — Z720 Tobacco use: Secondary | ICD-10-CM | POA: Diagnosis not present

## 2016-06-23 NOTE — Progress Notes (Signed)
[place] [phone]   [phone]  OFFICE PROGRESS NOTE  [name], [place] [address]  DIAGNOSIS: Stage IIIA (T3, N2, M0) non-small cell lung cancer, squamous cell carcinoma diagnosed in June 2016.  PRIOR THERAPY:  1) Concurrent chemoradiation with weekly carboplatin for an AUC of 2 and paclitaxel 45 mg/m2 with partial response.  2) Consolidation systemic chemotherapy with carboplatin for AUC of 5 and paclitaxel 175 MG/M2 every 3 weeks. First cycle 06/10/2015. Status post 3 cycles.  CURRENT THERAPY: Observation.  INTERVAL HISTORY: Colton Nguyen 70 y.o. male returns to the clinic today for follow-up visit. The patient is feeling fine today with no specific complaints. He denied having any significant nausea or vomiting, no fever or chills. He denied having any significant weight loss or night sweats. Unfortunately he continues to smoke. The patient denied having any significant chest pain, but continues to have shortness of breath with exertion with mild cough with no hemoptysis. The patient had repeat CT scan of the chest performed recently and he is here for evaluation and discussion of his scan results.   MEDICAL HISTORY: Past Medical History:  Diagnosis Date  . Anxiety    situational  . BPH (benign prostatic hyperplasia)   . CAD (coronary artery disease)   . CHF (congestive heart failure) ([facility])   . Chronic cardiopulmonary disease ([facility])   . COPD (chronic obstructive pulmonary disease) ([facility])   . Hyperlipidemia   . Hypertension   . lung ca dx'd 03/2015  . Mass of lung   . Nicotine dependence   . Personal history of kidney stones   . Restless leg   . Shortness of breath dyspnea    with exertion    ALLERGIES:  has No Known Allergies.  MEDICATIONS:  Current Outpatient Prescriptions  Medication Sig Dispense Refill  . aspirin 81 MG tablet Take 81 mg by mouth daily.    Marland Kitchen atorvastatin (LIPITOR) 40 MG  tablet Take 40 mg by mouth daily.     . cetirizine (ZYRTEC) 10 MG tablet Take 10 mg by mouth daily as needed for allergies.    . Cholecalciferol (VITAMIN D3) 5000 UNITS TABS Take 5,000 Units by mouth daily.    . finasteride (PROSCAR) 5 MG tablet Take 1 tablet (5 mg total) by mouth daily. 30 tablet 0  . fluticasone (FLONASE) 50 MCG/ACT nasal spray Place 2 sprays into both nostrils daily as needed for allergies.     Marland Kitchen losartan (COZAAR) 50 MG tablet Take 50 mg by mouth daily.    Marland Kitchen PROAIR HFA 108 (90 BASE) MCG/ACT inhaler Inhale 2 puffs into the lungs every 4 (four) hours as needed for wheezing or shortness of breath.     . tamsulosin (FLOMAX) 0.4 MG CAPS capsule Take 0.4 mg by mouth daily.      No current facility-administered medications for this visit.     SURGICAL HISTORY:  Past Surgical History:  Procedure Laterality Date  . BACK SURGERY    . CAD CCTA 01/31/15    . CERVICAL FUSION    . COLONOSCOPY    . LUNG BIOPSY N/A 02/27/2015   Procedure: LUNG BIOPSY;  Surgeon: Grace Isaac, MD;  Location: [facility];  Service: Thoracic;  Laterality: N/A;  . right inguinal hernia repair at age 85    . VIDEO BRONCHOSCOPY WITH ENDOBRONCHIAL ULTRASOUND N/A 02/27/2015   Procedure: VIDEO BRONCHOSCOPY WITH ENDOBRONCHIAL ULTRASOUND;  Surgeon: Grace Isaac, MD;  Location:  [facility];  Service: Thoracic;  Laterality: N/A;    REVIEW OF SYSTEMS:  A comprehensive review of systems was negative except for: Respiratory: positive for cough and dyspnea on exertion   PHYSICAL EXAMINATION: General appearance: alert, cooperative and no distress Head: Normocephalic, without obvious abnormality, atraumatic Neck: no adenopathy, no JVD, supple, symmetrical, trachea midline and thyroid not enlarged, symmetric, no tenderness/mass/nodules Lymph nodes: Cervical, supraclavicular, and axillary nodes normal. Resp: clear to auscultation bilaterally Back: symmetric, no curvature. ROM normal. No CVA tenderness. Cardio: regular rate  and rhythm, S1, S2 normal, no murmur, click, rub or gallop GI: soft, non-tender; bowel sounds normal; no masses,  no organomegaly Extremities: extremities normal, atraumatic, no cyanosis or edema Neurologic: Alert and oriented X 3, normal strength and tone. Normal symmetric reflexes. Normal coordination and gait  ECOG PERFORMANCE STATUS: 1 - Symptomatic but completely ambulatory  Blood pressure (!) 154/80, pulse 83, temperature 98.6 F (37 C), temperature source Oral, resp. rate 16, height '5\' 8"'$  (1.727 m), weight 205 lb 11.2 oz (93.3 kg), SpO2 97 %.  LABORATORY DATA: Lab Results  Component Value Date   WBC 5.3 06/16/2016   HGB 14.3 06/16/2016   HCT 43.6 06/16/2016   MCV 81.8 06/16/2016   PLT 224 06/16/2016      Chemistry      Component Value Date/Time   NA 140 06/16/2016 1056   K 4.2 06/16/2016 1056   CL 102 02/27/2015 0701   CO2 25 06/16/2016 1056   BUN 7.6 06/16/2016 1056   CREATININE 1.1 06/16/2016 1056      Component Value Date/Time   CALCIUM 9.2 06/16/2016 1056   ALKPHOS 111 06/16/2016 1056   AST 16 06/16/2016 1056   ALT 11 06/16/2016 1056   BILITOT 0.47 06/16/2016 1056       RADIOGRAPHIC STUDIES: Ct Chest W Contrast  Result Date: 06/16/2016 CLINICAL DATA:  70 year old male with history of lung cancer originally diagnosed in July 2016 status post chemotherapy and radiation therapy now complete. Followup study. Chronic cough and shortness of breath. EXAM: CT CHEST WITH CONTRAST TECHNIQUE: Multidetector CT imaging of the chest was performed during intravenous contrast administration. CONTRAST:  76m ISOVUE-300 IOPAMIDOL (ISOVUE-300) INJECTION 61% COMPARISON:  Chest CT 02/12/2016. FINDINGS: Cardiovascular: Heart size is normal. There is no significant pericardial fluid, thickening or pericardial calcification. There is aortic atherosclerosis, as well as atherosclerosis of the great vessels of the mediastinum and the coronary arteries, including calcified atherosclerotic  plaque in the left main, left anterior descending, left circumflex and right coronary arteries. Mediastinum/Nodes: No pathologically enlarged mediastinal or hilar lymph nodes. Esophagus is unremarkable in appearance. Thyroid gland is diffusely enlarged with multiple small hypovascular nodules, similar prior studies, presumably a multinodular goiter. No axillary lymphadenopathy. Lungs/Pleura: Chronic volume loss and architectural distortion is noted throughout the right upper lobe where there is extensive thickening of the peribronchovascular interstitium and some cylindrical bronchiectasis, most compatible with chronic postradiation changes. Adjacent pleural thickening in the apex of the right hemithorax, similar to prior studies. Compensatory hyperexpansion of the right middle and lower lobes. Mild diffuse bronchial wall thickening with mild centrilobular and paraseptal emphysema. A few scattered tiny subpleural pulmonary nodules are noted throughout the lungs bilaterally, the largest of which measures up to 5 mm in the periphery of the left lower lobe (image 136 of series 5), which are unchanged, most compatible with subpleural lymph nodes. No other larger more suspicious appearing pulmonary nodules or masses are noted. No acute consolidative airspace disease. No pleural effusions. Upper  Abdomen: 1.6 x 2.0 cm right adrenal nodule is unchanged. Chronic left adrenal thickening is unchanged. Diffuse low attenuation throughout the hepatic parenchyma, compatible with hepatic steatosis. Aortic atherosclerosis. Musculoskeletal: Orthopedic fixation hardware in the lower cervical spine incompletely imaged. There are no aggressive appearing lytic or blastic lesions noted in the visualized portions of the skeleton. IMPRESSION: 1. Chronic postradiation changes of right upper lobe fibrosis, similar to prior examinations, without evidence to suggest local recurrence of disease or definite metastatic disease in the thorax. 2.  Multiple tiny subpleural pulmonary nodules scattered throughout the lungs bilaterally, similar to prior examinations, considered benign, presumably subpleural lymph nodes. 3. 2.0 x 1.6 cm right adrenal nodule, previously characterized as an adenoma. Left adrenal gland thickening is unchanged, likely adrenal hyperplasia. 4. Hepatic steatosis. 5. Aortic atherosclerosis, in addition to left main and 3 vessel coronary artery disease. Please note that although the presence of coronary artery calcium documents the presence of coronary artery disease, the severity of this disease and any potential stenosis cannot be assessed on this non-gated CT examination. Assessment for potential risk factor modification, dietary therapy or pharmacologic therapy may be warranted, if clinically indicated. 6. Mild diffuse bronchial thickening with mild centrilobular and paraseptal emphysema; imaging findings suggestive of underlying COPD. Electronically Signed   By: Vinnie Langton M.D.   On: 06/16/2016 14:32    ASSESSMENT AND PLAN: This is a very pleasant 70 years old African-American male with a stage IIIa non-small cell lung cancer. The patient completed a course of concurrent chemoradiation with excellent response to his treatment. He underwent consolidation chemotherapy with reduced dose carboplatin and paclitaxel status post 3 cycles. He is currently on observation and the recent CT scan of the chest showed no evidence for disease progression. I discussed the scan results with the patient today. I recommended for him to continue on observation with repeat CT scan of the chest in 6 months. For smoke cessation I strongly encouraged the patient to quit smoking and he received counseling from the thoracic navigator today. He was advised to call immediately if he has any concerning symptoms in the interval. The patient voices understanding of current disease status and treatment options and is in agreement with the current care  plan.  All questions were answered. The patient knows to call the clinic with any problems, questions or concerns. We can certainly see the patient much sooner if necessary.  Disclaimer: This note was dictated with voice recognition software. Similar sounding words can inadvertently be transcribed and may not be corrected upon review.

## 2016-06-23 NOTE — Telephone Encounter (Signed)
GAVE PATIENT AVS REPORT AND APPOINTMENTS FOR April. [facility] WILL CALL RE SCAN.

## 2016-09-03 ENCOUNTER — Other Ambulatory Visit: Payer: Self-pay | Admitting: Nurse Practitioner

## 2016-12-21 ENCOUNTER — Ambulatory Visit (HOSPITAL_COMMUNITY)
Admission: RE | Admit: 2016-12-21 | Discharge: 2016-12-21 | Disposition: A | Discharge status: Home / Self Care | Payer: Medicare Other | Source: Ambulatory Visit | Attending: Internal Medicine | Admitting: Internal Medicine

## 2016-12-21 ENCOUNTER — Encounter (HOSPITAL_COMMUNITY): Payer: Self-pay

## 2016-12-21 ENCOUNTER — Other Ambulatory Visit (HOSPITAL_BASED_OUTPATIENT_CLINIC_OR_DEPARTMENT_OTHER): Payer: Medicare Other

## 2016-12-21 DIAGNOSIS — Y842 Radiological procedure and radiotherapy as the cause of abnormal reaction of the patient, or of later complication, without mention of misadventure at the time of the procedure: Secondary | ICD-10-CM | POA: Diagnosis not present

## 2016-12-21 DIAGNOSIS — Z720 Tobacco use: Secondary | ICD-10-CM | POA: Diagnosis not present

## 2016-12-21 DIAGNOSIS — C3411 Malignant neoplasm of upper lobe, right bronchus or lung: Secondary | ICD-10-CM | POA: Diagnosis not present

## 2016-12-21 DIAGNOSIS — E278 Other specified disorders of adrenal gland: Secondary | ICD-10-CM | POA: Diagnosis not present

## 2016-12-21 LAB — CBC WITH DIFFERENTIAL/PLATELET
BASO%: 0.3 % (ref 0.0–2.0)
Basophils Absolute: 0 10*3/uL (ref 0.0–0.1)
EOS%: 2.5 % (ref 0.0–7.0)
Eosinophils Absolute: 0.1 10*3/uL (ref 0.0–0.5)
HEMATOCRIT: 43.3 % (ref 38.4–49.9)
HEMOGLOBIN: 14.4 g/dL (ref 13.0–17.1)
LYMPH#: 1.4 10*3/uL (ref 0.9–3.3)
LYMPH%: 25.5 % (ref 14.0–49.0)
MCH: 26.9 pg — ABNORMAL LOW (ref 27.2–33.4)
MCHC: 33.3 g/dL (ref 32.0–36.0)
MCV: 80.8 fL (ref 79.3–98.0)
MONO#: 0.5 10*3/uL (ref 0.1–0.9)
MONO%: 8.2 % (ref 0.0–14.0)
NEUT%: 63.5 % (ref 39.0–75.0)
NEUTROS ABS: 3.5 10*3/uL (ref 1.5–6.5)
Platelets: 232 10*3/uL (ref 140–400)
RBC: 5.36 10*6/uL (ref 4.20–5.82)
RDW: 16.4 % — ABNORMAL HIGH (ref 11.0–14.6)
WBC: 5.6 10*3/uL (ref 4.0–10.3)

## 2016-12-21 LAB — COMPREHENSIVE METABOLIC PANEL
ALBUMIN: 3.6 g/dL (ref 3.5–5.0)
ALK PHOS: 95 U/L (ref 40–150)
ALT: 18 U/L (ref 0–55)
AST: 20 U/L (ref 5–34)
Anion Gap: 10 mEq/L (ref 3–11)
BILIRUBIN TOTAL: 0.47 mg/dL (ref 0.20–1.20)
BUN: 12.8 mg/dL (ref 7.0–26.0)
CALCIUM: 9.1 mg/dL (ref 8.4–10.4)
CHLORIDE: 104 meq/L (ref 98–109)
CO2: 25 mEq/L (ref 22–29)
Creatinine: 1.2 mg/dL (ref 0.7–1.3)
EGFR: 74 mL/min/{1.73_m2} — ABNORMAL LOW (ref >90)
Glucose: 108 mg/dl (ref 70–140)
Potassium: 4 mEq/L (ref 3.5–5.1)
Sodium: 139 mEq/L (ref 136–145)
TOTAL PROTEIN: 6.9 g/dL (ref 6.4–8.3)

## 2016-12-21 MED ORDER — IOPAMIDOL (ISOVUE-300) INJECTION 61%
INTRAVENOUS | Status: AC
Start: 2016-12-21 — End: 2016-12-21
  Administered 2016-12-21: 75 mL
  Filled 2016-12-21: qty 75

## 2016-12-28 ENCOUNTER — Telehealth: Payer: Self-pay | Admitting: Internal Medicine

## 2016-12-28 ENCOUNTER — Encounter: Payer: Self-pay | Admitting: Internal Medicine

## 2016-12-28 ENCOUNTER — Ambulatory Visit (HOSPITAL_BASED_OUTPATIENT_CLINIC_OR_DEPARTMENT_OTHER): Payer: Medicare Other | Admitting: Internal Medicine

## 2016-12-28 VITALS — BP 154/77 | HR 80 | Temp 98.8°F | Resp 19 | Ht 68.0 in | Wt 207.6 lb

## 2016-12-28 DIAGNOSIS — J439 Emphysema, unspecified: Secondary | ICD-10-CM | POA: Insufficient documentation

## 2016-12-28 DIAGNOSIS — I10 Essential (primary) hypertension: Secondary | ICD-10-CM | POA: Diagnosis not present

## 2016-12-28 DIAGNOSIS — J449 Chronic obstructive pulmonary disease, unspecified: Secondary | ICD-10-CM | POA: Diagnosis not present

## 2016-12-28 DIAGNOSIS — Z720 Tobacco use: Secondary | ICD-10-CM | POA: Diagnosis not present

## 2016-12-28 DIAGNOSIS — C3411 Malignant neoplasm of upper lobe, right bronchus or lung: Secondary | ICD-10-CM

## 2016-12-28 HISTORY — DX: Essential (primary) hypertension: I10

## 2016-12-28 NOTE — Telephone Encounter (Signed)
Appointments scheduled per 12/28/16 los. Patient was given a copy of the AVS report and appointment schedule per 12/28/16 los.

## 2016-12-28 NOTE — Progress Notes (Signed)
[facility] [phone]   Fax:([phone]  OFFICE PROGRESS NOTE  [name], [place] [address]  DIAGNOSIS: Stage IIIA (T3, N2, M0) non-small cell lung cancer, squamous cell carcinoma diagnosed in June 2016.  PRIOR THERAPY:  1) Concurrent chemoradiation with weekly carboplatin for an AUC of 2 and paclitaxel 45 mg/m2 with partial response.  2) Consolidation systemic chemotherapy with carboplatin for AUC of 5 and paclitaxel 175 MG/M2 every 3 weeks. First cycle 06/10/2015. Status post 3 cycles.  CURRENT THERAPY: Observation.  INTERVAL HISTORY: Colton Nguyen 71 y.o. male returns to the clinic today for follow-up visit accompanied by his sister. The patient is feeling fine today with no specific complaints except for mild shortness of breath with exertion and dry cough. He denied having any chest pain or hemoptysis. He has no fever or chills. He denied having any nausea, vomiting, diarrhea or constipation. He denied having any weight loss or night sweats. Unfortunately he continues to smoke one pack of cigarette every day. He had repeat CT scan of the chest performed recently and he is here for evaluation and discussion of his scan results.  MEDICAL HISTORY: Past Medical History:  Diagnosis Date  . Anxiety    situational  . BPH (benign prostatic hyperplasia)   . CAD (coronary artery disease)   . CHF (congestive heart failure) ([facility])   . Chronic cardiopulmonary disease ([facility])   . COPD (chronic obstructive pulmonary disease) ([facility])   . Hyperlipidemia   . Hypertension   . lung ca dx'd 03/2015  . Mass of lung   . Nicotine dependence   . Personal history of kidney stones   . Restless leg   . Shortness of breath dyspnea    with exertion    ALLERGIES:  has No Known Allergies.  MEDICATIONS:  Current Outpatient Prescriptions  Medication Sig Dispense Refill  . aspirin 81 MG tablet Take 81 mg by mouth daily.    Marland Kitchen  atorvastatin (LIPITOR) 40 MG tablet Take 40 mg by mouth daily.     . cetirizine (ZYRTEC) 10 MG tablet Take 10 mg by mouth daily as needed for allergies.    . Cholecalciferol (VITAMIN D3) 5000 UNITS TABS Take 5,000 Units by mouth daily.    . finasteride (PROSCAR) 5 MG tablet Take 1 tablet (5 mg total) by mouth daily. 30 tablet 0  . fluticasone (FLONASE) 50 MCG/ACT nasal spray Place 2 sprays into both nostrils daily as needed for allergies.     Marland Kitchen losartan (COZAAR) 50 MG tablet Take 50 mg by mouth daily.    Marland Kitchen PROAIR HFA 108 (90 BASE) MCG/ACT inhaler Inhale 2 puffs into the lungs every 4 (four) hours as needed for wheezing or shortness of breath.     . tamsulosin (FLOMAX) 0.4 MG CAPS capsule Take 0.4 mg by mouth daily.      No current facility-administered medications for this visit.     SURGICAL HISTORY:  Past Surgical History:  Procedure Laterality Date  . BACK SURGERY    . CAD CCTA 01/31/15    . CERVICAL FUSION    . COLONOSCOPY    . LUNG BIOPSY N/A 02/27/2015   Procedure: LUNG BIOPSY;  Surgeon: Grace Isaac, MD;  Location: [facility];  Service: Thoracic;  Laterality: N/A;  . right inguinal hernia repair at age 9    . VIDEO BRONCHOSCOPY WITH ENDOBRONCHIAL ULTRASOUND N/A 02/27/2015   Procedure: VIDEO BRONCHOSCOPY WITH ENDOBRONCHIAL ULTRASOUND;  Surgeon:  Grace Isaac, MD;  Location: [facility];  Service: Thoracic;  Laterality: N/A;    REVIEW OF SYSTEMS:  Constitutional: negative Eyes: negative Ears, nose, mouth, throat, and face: negative Respiratory: positive for cough and dyspnea on exertion Cardiovascular: negative Gastrointestinal: negative Genitourinary:negative Integument/breast: negative Hematologic/lymphatic: negative Musculoskeletal:negative Neurological: negative Behavioral/Psych: positive for tobacco use Endocrine: negative Allergic/Immunologic: negative   PHYSICAL EXAMINATION: General appearance: alert, cooperative and no distress Head: Normocephalic, without obvious  abnormality, atraumatic Neck: no adenopathy, no JVD, supple, symmetrical, trachea midline and thyroid not enlarged, symmetric, no tenderness/mass/nodules Lymph nodes: Cervical, supraclavicular, and axillary nodes normal. Resp: clear to auscultation bilaterally Back: symmetric, no curvature. ROM normal. No CVA tenderness. Cardio: regular rate and rhythm, S1, S2 normal, no murmur, click, rub or gallop GI: soft, non-tender; bowel sounds normal; no masses,  no organomegaly Extremities: extremities normal, atraumatic, no cyanosis or edema  ECOG PERFORMANCE STATUS: 1 - Symptomatic but completely ambulatory  Blood pressure (!) 154/77, pulse 80, temperature 98.8 F (37.1 C), temperature source Oral, resp. rate 19, height '5\' 8"'$  (1.727 m), weight 207 lb 9.6 oz (94.2 kg), SpO2 97 %.  LABORATORY DATA: Lab Results  Component Value Date   WBC 5.6 12/21/2016   HGB 14.4 12/21/2016   HCT 43.3 12/21/2016   MCV 80.8 12/21/2016   PLT 232 12/21/2016      Chemistry      Component Value Date/Time   NA 139 12/21/2016 1556   K 4.0 12/21/2016 1556   CL 102 02/27/2015 0701   CO2 25 12/21/2016 1556   BUN 12.8 12/21/2016 1556   CREATININE 1.2 12/21/2016 1556      Component Value Date/Time   CALCIUM 9.1 12/21/2016 1556   ALKPHOS 95 12/21/2016 1556   AST 20 12/21/2016 1556   ALT 18 12/21/2016 1556   BILITOT 0.47 12/21/2016 1556       RADIOGRAPHIC STUDIES: Ct Chest W Contrast  Result Date: 12/22/2016 CLINICAL DATA:  Lung cancer, diagnosed 2016, chemotherapy/XRT complete, for restaging EXAM: CT CHEST WITH CONTRAST TECHNIQUE: Multidetector CT imaging of the chest was performed during intravenous contrast administration. CONTRAST:  8m ISOVUE-300 IOPAMIDOL (ISOVUE-300) INJECTION 61% COMPARISON:  06/16/2016 FINDINGS: Cardiovascular: Heart is normal in size.  No pericardial effusion. Three vessel coronary atherosclerosis. Atherosclerotic calcifications of the aortic arch. Mediastinum/Nodes: No suspicious  mediastinal lymphadenopathy. No suspicious hilar or axillary lymphadenopathy. Visualized left thyroid is heterogeneous/nodular, unchanged. Lungs/Pleura: Radiation changes in the medial right upper lobe/ perihilar region. No suspicious pulmonary nodules. 4 mm subpleural nodule in the lateral left lower lobe (series 5/ image 134), unchanged, benign. Additional 3 mm subpleural nodule in the lateral left lower lobe (series 5/ image 122), unchanged, benign. Underlying mild centrilobular and paraseptal emphysematous changes. No focal consolidation. No pleural effusion or pneumothorax. Upper Abdomen: 2.0 cm right adrenal nodule (series 2/ image 146), previously characterized as a benign adrenal adenoma. Mild nodular thickening of the left adrenal gland, unchanged. Musculoskeletal: Cervical spine fixation hardware, incompletely visualized. No focal osseous lesions. IMPRESSION: Radiation changes in the right upper lobe/perihilar region. No evidence of recurrent or metastatic disease. 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 Electronically Signed   By: SJulian HyM.D.   On: 12/22/2016 08:36    ASSESSMENT AND PLAN:  This is a very pleasant 71years old African-American male with a stage IIIa non-small cell lung cancer status post a course of concurrent chemoradiation followed by consolidation chemotherapy with carboplatin and paclitaxel. The patient has been observation since early  2017. He is feeling fine with no specific complaints today except for mild cough and shortness of breath. His recent CT scan of the chest showed no evidence for disease progression. I discussed the scan results with the patient and his sister and recommended for him to continue on observation with repeat CT scan of the chest in 6 months. For smoke cessation I had a lengthy discussion with the patient today about his the importance of quitting smoking and I discussed with him plan for smoke  cessation. For hypertension, the patient will continue his current treatment with losartan. For COPD, he will continue his current treatment with ProAir inhalers. He was advised to call immediately if he has any concerning symptoms in the interval. The patient voices understanding of current disease status and treatment options and is in agreement with the current care plan. All questions were answered. The patient knows to call the clinic with any problems, questions or concerns. We can certainly see the patient much sooner if necessary.  Disclaimer: This note was dictated with voice recognition software. Similar sounding words can inadvertently be transcribed and may not be corrected upon review.

## 2017-02-15 ENCOUNTER — Other Ambulatory Visit: Payer: Self-pay

## 2017-03-15 ENCOUNTER — Ambulatory Visit (INDEPENDENT_AMBULATORY_CARE_PROVIDER_SITE_OTHER): Payer: Medicare Other | Admitting: Urology

## 2017-03-15 ENCOUNTER — Encounter: Payer: Self-pay | Admitting: Urology

## 2017-03-15 VITALS — BP 133/70 | HR 99 | Ht 68.0 in | Wt 209.4 lb

## 2017-03-15 DIAGNOSIS — R972 Elevated prostate specific antigen [PSA]: Secondary | ICD-10-CM

## 2017-03-15 NOTE — Progress Notes (Signed)
03/15/2017 3:35 PM   Colton Nguyen March 03, 1946 [id number]  Referring provider: [name], [place], [place] [address]  Chief Complaint  Patient presents with  . Elevated PSA    New Patient    HPI: 71 year old male presents today for further evaluation and management of an elevated PSA. The patient has a past urologic history of elevated PSA as well as urinary tract infection and testicular infection. In fact, in 2015 his PSA was 8.6. Repeat PSA was 11 point 4 on 09/2014. These resolved and returned to around 3 according to the patient. The patient takes finasteride and tamsulosin for his urinary tract symptoms. He has been on these for quite a while. The patient states that his most recent PSA was 3.2. He denies any progression of his urinary tract symptoms. He claims to have a strong stream and feels that he empties his bladder completely. He denies any hematuria or dysuria. He wonders if he can ask her stops all of these medications.  The patient has a cold with a history of prostate cancer who died in his eighties.     PMH: Past Medical History:  Diagnosis Date  . Anxiety    situational  . BPH (benign prostatic hyperplasia)   . CAD (coronary artery disease)   . CHF (congestive heart failure) ([place])   . Chronic cardiopulmonary disease ([facility])   . COPD (chronic obstructive pulmonary disease) ([place] [place])   . Hyperlipidemia   . Hypertension   . Hypertension 12/28/2016  . lung ca dx'd 03/2015  . Mass of lung   . Nicotine dependence   . Personal history of kidney stones   . Restless leg   . Shortness of breath dyspnea    with exertion    Surgical History: Past Surgical History:  Procedure Laterality Date  . BACK SURGERY    . CAD CCTA 01/31/15    . CERVICAL FUSION    . COLONOSCOPY    . LUNG BIOPSY N/A 02/27/2015   Procedure: LUNG BIOPSY;  Surgeon: Grace Isaac, MD;  Location: [facility];  Service: Thoracic;  Laterality: N/A;  . right inguinal hernia  repair at age 64    . VIDEO BRONCHOSCOPY WITH ENDOBRONCHIAL ULTRASOUND N/A 02/27/2015   Procedure: VIDEO BRONCHOSCOPY WITH ENDOBRONCHIAL ULTRASOUND;  Surgeon: Grace Isaac, MD;  Location: [facility];  Service: Thoracic;  Laterality: N/A;    Home Medications:  Allergies as of 03/15/2017   No Known Allergies     Medication List       Accurate as of 03/15/17  3:35 PM. Always use your most recent med list.          aspirin 81 MG tablet Take 81 mg by mouth daily.   atorvastatin 40 MG tablet Commonly known as:  LIPITOR Take 40 mg by mouth daily.   cetirizine 10 MG tablet Commonly known as:  ZYRTEC Take 10 mg by mouth daily as needed for allergies.   finasteride 5 MG tablet Commonly known as:  PROSCAR Take 1 tablet (5 mg total) by mouth daily.   fluticasone 50 MCG/ACT nasal spray Commonly known as:  FLONASE Place 2 sprays into both nostrils daily as needed for allergies.   losartan 50 MG tablet Commonly known as:  COZAAR Take 50 mg by mouth daily.   PROAIR HFA 108 (90 Base) MCG/ACT inhaler Generic drug:  albuterol Inhale 2 puffs into the lungs every 4 (four) hours as needed for wheezing or shortness of breath.   tamsulosin  0.4 MG Caps capsule Commonly known as:  FLOMAX Take 0.4 mg by mouth daily.   Vitamin D3 5000 units Tabs Take 5,000 Units by mouth daily.       Allergies: No Known Allergies  Family History: Family History  Problem Relation Age of Onset  . Cancer Unknown   . Heart disease Father   . Hypertension Unknown     Social History:  reports that he has been smoking Cigarettes.  He started smoking about 50 years ago. He has a 70.50 pack-year smoking history. He has never used smokeless tobacco. He reports that he drinks alcohol. He reports that he does not use drugs.  ROS: UROLOGY Frequent Urination?: Yes Hard to postpone urination?: Yes Burning/pain with urination?: No Get up at night to urinate?: Yes Leakage of urine?: No Urine stream starts and  stops?: No Trouble starting stream?: No Do you have to strain to urinate?: No Blood in urine?: No Urinary tract infection?: No Sexually transmitted disease?: No Injury to kidneys or bladder?: No Painful intercourse?: No Weak stream?: Yes Erection problems?: Yes Penile pain?: No  Gastrointestinal Nausea?: No Vomiting?: No Indigestion/heartburn?: No Diarrhea?: No Constipation?: No  Constitutional Fever: No Night sweats?: No Weight loss?: No Fatigue?: No  Skin Skin rash/lesions?: Yes Itching?: Yes  Eyes Blurred vision?: No Double vision?: No  Ears/Nose/Throat Sore throat?: No Sinus problems?: Yes  Hematologic/Lymphatic Swollen glands?: No Easy bruising?: No  Cardiovascular Leg swelling?: No Chest pain?: No  Respiratory Cough?: Yes Shortness of breath?: Yes  Endocrine Excessive thirst?: No  Musculoskeletal Back pain?: Yes Joint pain?: Yes  Neurological Headaches?: No Dizziness?: No  Psychologic Depression?: No Anxiety?: Yes  Physical Exam: BP 133/70 (BP Location: Left Arm, Patient Position: Sitting, Cuff Size: Normal)   Pulse 99   Ht 5\' 8"  (1.727 m)   Wt 95 kg (209 lb 6.4 oz)   BMI 31.84 kg/m   Constitutional:  Alert and oriented, No acute distress. HEENT: [place] AT, moist mucus membranes.  Trachea midline, no masses. Cardiovascular: No clubbing, cyanosis, or edema. Respiratory: Normal respiratory effort, no increased work of breathing. GI: Abdomen is soft, nontender, nondistended, no abdominal masses GU: No CVA tenderness DRE: The patient has a wide-based prostate, plus 3 in size, symmetric, no nodules Skin: No rashes, bruises or suspicious lesions. Lymph: No cervical or inguinal adenopathy. Neurologic: Grossly intact, no focal deficits, moving all 4 extremities. Psychiatric: Normal mood and affect.  Laboratory Data: Lab Results  Component Value Date   WBC 5.6 12/21/2016   HGB 14.4 12/21/2016   HCT 43.3 12/21/2016   MCV 80.8 12/21/2016    PLT 232 12/21/2016    Lab Results  Component Value Date   CREATININE 1.2 12/21/2016    No results found for: PSA  No results found for: TESTOSTERONE  No results found for: HGBA1C  Urinalysis No results found for: COLORURINE, APPEARANCEUR, LABSPEC, PHURINE, GLUCOSEU, HGBUR, BILIRUBINUR, KETONESUR, PROTEINUR, UROBILINOGEN, NITRITE, LEUKOCYTESUR  Pertinent Imaging: None  Assessment & Plan:  The patient has an elevated PSA, when you double this for his finasteride it is approximately 6.8. However, his PSA has been elevated for quite some time, and it does not appear to be significantly higher than it has been in the past. At this point, I recommended that we trend his PSA and repeated in 6 months. If at that point it continues to rise or is significantly elevated, we will consider a prostate biopsy that time.  There are no diagnoses linked to this encounter.  No  Follow-up on file.  Ardis Hughs, [facility] [address], [place], [place] [address] [phone]

## 2017-06-25 ENCOUNTER — Ambulatory Visit (HOSPITAL_COMMUNITY)
Admission: RE | Admit: 2017-06-25 | Discharge: 2017-06-25 | Disposition: A | Discharge status: Home / Self Care | Payer: Medicare Other | Source: Ambulatory Visit | Attending: Internal Medicine | Admitting: Internal Medicine

## 2017-06-25 ENCOUNTER — Other Ambulatory Visit (HOSPITAL_BASED_OUTPATIENT_CLINIC_OR_DEPARTMENT_OTHER): Payer: Medicare Other

## 2017-06-25 DIAGNOSIS — E279 Disorder of adrenal gland, unspecified: Secondary | ICD-10-CM | POA: Diagnosis not present

## 2017-06-25 DIAGNOSIS — Y842 Radiological procedure and radiotherapy as the cause of abnormal reaction of the patient, or of later complication, without mention of misadventure at the time of the procedure: Secondary | ICD-10-CM | POA: Insufficient documentation

## 2017-06-25 DIAGNOSIS — C3411 Malignant neoplasm of upper lobe, right bronchus or lung: Secondary | ICD-10-CM | POA: Insufficient documentation

## 2017-06-25 DIAGNOSIS — Z720 Tobacco use: Secondary | ICD-10-CM | POA: Diagnosis not present

## 2017-06-25 DIAGNOSIS — R918 Other nonspecific abnormal finding of lung field: Secondary | ICD-10-CM | POA: Diagnosis not present

## 2017-06-25 LAB — CBC WITH DIFFERENTIAL/PLATELET
BASO%: 0.6 % (ref 0.0–2.0)
BASOS ABS: 0 10*3/uL (ref 0.0–0.1)
EOS ABS: 0.1 10*3/uL (ref 0.0–0.5)
EOS%: 2.6 % (ref 0.0–7.0)
HCT: 42.6 % (ref 38.4–49.9)
HEMOGLOBIN: 13.9 g/dL (ref 13.0–17.1)
LYMPH%: 23.4 % (ref 14.0–49.0)
MCH: 27.4 pg (ref 27.2–33.4)
MCHC: 32.8 g/dL (ref 32.0–36.0)
MCV: 83.6 fL (ref 79.3–98.0)
MONO#: 0.4 10*3/uL (ref 0.1–0.9)
MONO%: 8.7 % (ref 0.0–14.0)
NEUT#: 3.3 10*3/uL (ref 1.5–6.5)
NEUT%: 64.7 % (ref 39.0–75.0)
PLATELETS: 211 10*3/uL (ref 140–400)
RBC: 5.09 10*6/uL (ref 4.20–5.82)
RDW: 15.3 % — ABNORMAL HIGH (ref 11.0–14.6)
WBC: 5 10*3/uL (ref 4.0–10.3)
lymph#: 1.2 10*3/uL (ref 0.9–3.3)

## 2017-06-25 LAB — COMPREHENSIVE METABOLIC PANEL
ALBUMIN: 3.5 g/dL (ref 3.5–5.0)
ALK PHOS: 97 U/L (ref 40–150)
ALT: 13 U/L (ref 0–55)
ANION GAP: 10 meq/L (ref 3–11)
AST: 17 U/L (ref 5–34)
BUN: 8.2 mg/dL (ref 7.0–26.0)
CALCIUM: 8.9 mg/dL (ref 8.4–10.4)
CO2: 27 mEq/L (ref 22–29)
Chloride: 105 mEq/L (ref 98–109)
Creatinine: 1.1 mg/dL (ref 0.7–1.3)
Glucose: 99 mg/dl (ref 70–140)
POTASSIUM: 3.5 meq/L (ref 3.5–5.1)
Sodium: 142 mEq/L (ref 136–145)
Total Bilirubin: 0.5 mg/dL (ref 0.20–1.20)
Total Protein: 6.8 g/dL (ref 6.4–8.3)

## 2017-06-25 MED ORDER — IOPAMIDOL (ISOVUE-300) INJECTION 61%
INTRAVENOUS | Status: AC
Start: 2017-06-25 — End: 2017-06-25
  Filled 2017-06-25: qty 75

## 2017-06-25 MED ORDER — IOPAMIDOL (ISOVUE-300) INJECTION 61%
75.0000 mL | Freq: Once | INTRAVENOUS | Status: AC | PRN
  Administered 2017-06-25: 75 mL via INTRAVENOUS

## 2017-06-28 ENCOUNTER — Encounter: Payer: Self-pay | Admitting: Internal Medicine

## 2017-06-28 ENCOUNTER — Ambulatory Visit (HOSPITAL_BASED_OUTPATIENT_CLINIC_OR_DEPARTMENT_OTHER): Payer: Medicare Other | Admitting: Internal Medicine

## 2017-06-28 ENCOUNTER — Telehealth: Payer: Self-pay | Admitting: Internal Medicine

## 2017-06-28 VITALS — BP 154/82 | HR 72 | Temp 98.9°F | Resp 19 | Ht 68.0 in | Wt 209.3 lb

## 2017-06-28 DIAGNOSIS — I10 Essential (primary) hypertension: Secondary | ICD-10-CM | POA: Diagnosis not present

## 2017-06-28 DIAGNOSIS — C3411 Malignant neoplasm of upper lobe, right bronchus or lung: Secondary | ICD-10-CM

## 2017-06-28 DIAGNOSIS — J449 Chronic obstructive pulmonary disease, unspecified: Secondary | ICD-10-CM | POA: Diagnosis not present

## 2017-06-28 NOTE — Telephone Encounter (Signed)
Gave avs and calendar for April 2019 °

## 2017-06-28 NOTE — Progress Notes (Signed)
[facility] Telephone:([phone]) [phone]   Fax:([phone]  OFFICE PROGRESS NOTE  McLean-Scocuzza, Nino Glow, MD [place] [address]  DIAGNOSIS: Stage IIIA (T3, N2, M0) non-small cell lung cancer, squamous cell carcinoma diagnosed in June 2016.  PRIOR THERAPY:  1) Concurrent chemoradiation with weekly carboplatin for an AUC of 2 and paclitaxel 45 mg/m2 with partial response.  2) Consolidation systemic chemotherapy with carboplatin for AUC of 5 and paclitaxel 175 MG/M2 every 3 weeks. First cycle 06/10/2015. Status post 3 cycles.  CURRENT THERAPY: Observation.  INTERVAL HISTORY: Colton Nguyen 71 y.o. male returns to the clinic today for follow-up visit. The patient is feeling fine today with no specific complaints. He denied having any chest pain, shortness of breath, cough or hemoptysis. He denied having any fever or chills. He has no nausea, vomiting, diarrhea or constipation. The patient denied having any significant weight loss or night sweats. He had repeat CT scan of the chest performed recently and he is here for evaluation and discussion of his scan results.   MEDICAL HISTORY: Past Medical History:  Diagnosis Date  . Anxiety    situational  . BPH (benign prostatic hyperplasia)   . CAD (coronary artery disease)   . CHF (congestive heart failure) ([facility])   . Chronic cardiopulmonary disease ([facility] [place])   . COPD (chronic obstructive pulmonary disease) ([facility])   . Hyperlipidemia   . Hypertension   . Hypertension 12/28/2016  . lung ca dx'd 03/2015  . Mass of lung   . Nicotine dependence   . Personal history of kidney stones   . Restless leg   . Shortness of breath dyspnea    with exertion    ALLERGIES:  has No Known Allergies.  MEDICATIONS:  Current Outpatient Prescriptions  Medication Sig Dispense Refill  . amLODipine (NORVASC) 5 MG tablet     . aspirin 81 MG tablet Take 81 mg by mouth daily.    Marland Kitchen atorvastatin (LIPITOR) 40 MG tablet Take 40  mg by mouth daily.     . cetirizine (ZYRTEC) 10 MG tablet Take 10 mg by mouth daily as needed for allergies.    . Cholecalciferol (VITAMIN D3) 5000 UNITS TABS Take 5,000 Units by mouth daily.    . finasteride (PROSCAR) 5 MG tablet Take 1 tablet (5 mg total) by mouth daily. 30 tablet 0  . fluticasone (FLONASE) 50 MCG/ACT nasal spray Place 2 sprays into both nostrils daily as needed for allergies.     Marland Kitchen losartan (COZAAR) 50 MG tablet Take 50 mg by mouth daily.    Marland Kitchen PROAIR HFA 108 (90 BASE) MCG/ACT inhaler Inhale 2 puffs into the lungs every 4 (four) hours as needed for wheezing or shortness of breath.     . tamsulosin (FLOMAX) 0.4 MG CAPS capsule Take 0.4 mg by mouth daily.      No current facility-administered medications for this visit.     SURGICAL HISTORY:  Past Surgical History:  Procedure Laterality Date  . BACK SURGERY    . CAD CCTA 01/31/15    . CERVICAL FUSION    . COLONOSCOPY    . LUNG BIOPSY N/A 02/27/2015   Procedure: LUNG BIOPSY;  Surgeon: Grace Isaac, MD;  Location: [facility];  Service: Thoracic;  Laterality: N/A;  . right inguinal hernia repair at age 66    . VIDEO BRONCHOSCOPY WITH ENDOBRONCHIAL ULTRASOUND N/A 02/27/2015   Procedure: VIDEO BRONCHOSCOPY WITH ENDOBRONCHIAL ULTRASOUND;  Surgeon: Grace Isaac, MD;  Location[facility];  Service: Thoracic;  Laterality: N/A;    REVIEW OF SYSTEMS:  A comprehensive review of systems was negative.   PHYSICAL EXAMINATION: General appearance: alert, cooperative and no distress Head: Normocephalic, without obvious abnormality, atraumatic Neck: no adenopathy, no JVD, supple, symmetrical, trachea midline and thyroid not enlarged, symmetric, no tenderness/mass/nodules Lymph nodes: Cervical, supraclavicular, and axillary nodes normal. Resp: clear to auscultation bilaterally Back: symmetric, no curvature. ROM normal. No CVA tenderness. Cardio: regular rate and rhythm, S1, S2 normal, no murmur, click, rub or gallop GI: soft, non-tender;  bowel sounds normal; no masses,  no organomegaly Extremities: extremities normal, atraumatic, no cyanosis or edema  ECOG PERFORMANCE STATUS: 1 - Symptomatic but completely ambulatory  Blood pressure (!) 154/82, pulse 72, temperature 98.9 F (37.2 C), temperature source Oral, resp. rate 19, height 5\' 8"  (1.727 m), weight 209 lb 4.8 oz (94.9 kg), SpO2 97 %.  LABORATORY DATA: Lab Results  Component Value Date   WBC 5.0 06/25/2017   HGB 13.9 06/25/2017   HCT 42.6 06/25/2017   MCV 83.6 06/25/2017   PLT 211 06/25/2017      Chemistry      Component Value Date/Time   NA 142 06/25/2017 0819   K 3.5 06/25/2017 0819   CL 102 02/27/2015 0701   CO2 27 06/25/2017 0819   BUN 8.2 06/25/2017 0819   CREATININE 1.1 06/25/2017 0819      Component Value Date/Time   CALCIUM 8.9 06/25/2017 0819   ALKPHOS 97 06/25/2017 0819   AST 17 06/25/2017 0819   ALT 13 06/25/2017 0819   BILITOT 0.50 06/25/2017 0819       RADIOGRAPHIC STUDIES: Ct Chest W Contrast  Result Date: 06/25/2017 CLINICAL DATA:  Stage IIIA non-small cell lung cancer. EXAM: CT CHEST WITH CONTRAST TECHNIQUE: Multidetector CT imaging of the chest was performed during intravenous contrast administration. CONTRAST:  40mL ISOVUE-300 IOPAMIDOL (ISOVUE-300) INJECTION 61% COMPARISON:  12/21/16 FINDINGS: Cardiovascular: Normal heart size. No pericardial effusion. Aortic atherosclerosis. Calcification in the RCA and LAD and left circumflex coronary artery noted. Mediastinum/Nodes: The trachea appears patent and is midline. Normal appearance of the esophagus. No enlarged mediastinal or hilar lymph nodes. No axillary or supraclavicular adenopathy. 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 Lungs/Pleura: Paramediastinal radiation changes identified within the right upper lung zone. Small subpleural nodules within the posterior right lower lobe are unchanged measuring 5 mm, image 86 of series  7. Unchanged left upper lobe pulmonary nodule measuring 4 mm, image 45 of series 7. Stable subpleural nodule overlying the posterolateral left lower lobe measures 5 mm. Also stable is a 5 mm subpleural nodule in the left lower lobe, image 128 of series 7. Upper Abdomen: No acute abnormality identified. Similar appearance of right adrenal gland nodule measuring 2 cm. Musculoskeletal: No aggressive lytic or sclerotic bone lesion. IMPRESSION: 1. Stable exam. 2. Radiation change within the paramediastinal and perihilar right upper lung zone is stable from previous exam. 3. Small nonspecific nodules in both lungs are unchanged measuring up to 5 mm. 4. Stable nodule in the right adrenal gland. Electronically Signed   By: Kerby Moors M.D.   On: 06/25/2017 12:59    ASSESSMENT AND PLAN:  This is a very pleasant 71 years old African-American male with a stage IIIa non-small cell lung cancer status post a course of concurrent chemoradiation followed by consolidation chemotherapy with carboplatin and paclitaxel. The patient has been observation for the last 2 years. He is  feeling fine with no specific complaints. Repeat CT scan of the chest showed no evidence for disease recurrence or progression. I discussed the scan results with the patient today and recommended for him to continue on observation with repeat CT scan of the chest in 6 months. For hypertension, the patient will continue his current treatment with losartan. For COPD, he will continue his current treatment with ProAir inhalers. The patient was advised to call immediately if he has any concerning symptoms in the interval. The patient voices understanding of current disease status and treatment options and is in agreement with the current care plan. All questions were answered. The patient knows to call the clinic with any problems, questions or concerns. We can certainly see the patient much sooner if necessary.  Disclaimer: This note was dictated  with voice recognition software. Similar sounding words can inadvertently be transcribed and may not be corrected upon review.

## 2017-09-14 ENCOUNTER — Other Ambulatory Visit: Payer: Medicare Other

## 2017-09-14 DIAGNOSIS — R972 Elevated prostate specific antigen [PSA]: Secondary | ICD-10-CM

## 2017-09-15 LAB — PSA: Prostate Specific Ag, Serum: 20.6 ng/mL — ABNORMAL HIGH (ref 0.0–4.0)

## 2017-09-16 ENCOUNTER — Ambulatory Visit (INDEPENDENT_AMBULATORY_CARE_PROVIDER_SITE_OTHER): Payer: Medicare Other | Admitting: Urology

## 2017-09-16 ENCOUNTER — Encounter: Payer: Self-pay | Admitting: Urology

## 2017-09-16 VITALS — BP 159/89 | HR 83 | Ht 68.0 in | Wt 208.8 lb

## 2017-09-16 DIAGNOSIS — N400 Enlarged prostate without lower urinary tract symptoms: Secondary | ICD-10-CM | POA: Diagnosis not present

## 2017-09-16 DIAGNOSIS — R972 Elevated prostate specific antigen [PSA]: Secondary | ICD-10-CM

## 2017-09-16 NOTE — Progress Notes (Signed)
09/16/2017 9:17 AM   Colton Nguyen 11-12-1945 [id number]  Referring provider: McLean-Scocuzza, Nino Glow, MD [name], [name] [address]  Chief Complaint  Patient presents with  . Elevated PSA    HPI: The patient is a 72 year old gentleman with BPH on finasteride presents today for follow-up of his elevated PSA.  His PSA was 8.6 in 2015.  In January 2016 was 11.4.  At his last visit in July 2018, his PSA was 3.2 in June 2018.  Repeat PSA in January 2019 was 20.6.  Patient is on Flomax and finasteride for his urinary symptoms.  His urinary symptoms are well controlled.  He has no urinary complaints.  He feels that he voids well.  The patient does have family history of prostate cancer.   PMH: Past Medical History:  Diagnosis Date  . Anxiety    situational  . BPH (benign prostatic hyperplasia)   . CAD (coronary artery disease)   . CHF (congestive heart failure) ([place])   . Chronic cardiopulmonary disease ([place])   . COPD (chronic obstructive pulmonary disease) ([place])   . Hyperlipidemia   . Hypertension   . Hypertension 12/28/2016  . lung ca dx'd 03/2015  . Mass of lung   . Nicotine dependence   . Personal history of kidney stones   . Restless leg   . Shortness of breath dyspnea    with exertion    Surgical History: Past Surgical History:  Procedure Laterality Date  . BACK SURGERY    . CAD CCTA 01/31/15    . CERVICAL FUSION    . COLONOSCOPY    . LUNG BIOPSY N/A 02/27/2015   Procedure: LUNG BIOPSY;  Surgeon: Grace Isaac, MD;  Location: [facility];  Service: Thoracic;  Laterality: N/A;  . right inguinal hernia repair at age 46    . VIDEO BRONCHOSCOPY WITH ENDOBRONCHIAL ULTRASOUND N/A 02/27/2015   Procedure: VIDEO BRONCHOSCOPY WITH ENDOBRONCHIAL ULTRASOUND;  Surgeon: Grace Isaac, MD;  Location: [facility];  Service: Thoracic;  Laterality: N/A;    Home Medications:  Allergies as of 09/16/2017   No Known Allergies     Medication List        Accurate  as of 09/16/17  9:17 AM. Always use your most recent med list.          amLODipine 5 MG tablet Commonly known as:  NORVASC   aspirin 81 MG tablet Take 81 mg by mouth daily.   atorvastatin 40 MG tablet Commonly known as:  LIPITOR Take 40 mg by mouth daily.   cetirizine 10 MG tablet Commonly known as:  ZYRTEC Take 10 mg by mouth daily as needed for allergies.   finasteride 5 MG tablet Commonly known as:  PROSCAR Take 1 tablet (5 mg total) by mouth daily.   fluticasone 50 MCG/ACT nasal spray Commonly known as:  FLONASE Place 2 sprays into both nostrils daily as needed for allergies.   losartan 50 MG tablet Commonly known as:  COZAAR Take 50 mg by mouth daily.   SYMBICORT 160-4.5 MCG/ACT inhaler Generic drug:  budesonide-formoterol   tamsulosin 0.4 MG Caps capsule Commonly known as:  FLOMAX Take 0.4 mg by mouth daily.   Vitamin D3 5000 units Tabs Take 5,000 Units by mouth daily.       Allergies: No Known Allergies  Family History: Family History  Problem Relation Age of Onset  . Cancer Unknown   . Heart disease Father   . Hypertension Unknown  Social History:  reports that he has been smoking cigarettes.  He started smoking about 50 years ago. He has a 70.50 pack-year smoking history. he has never used smokeless tobacco. He reports that he drinks alcohol. He reports that he does not use drugs.  ROS: UROLOGY Frequent Urination?: Yes Hard to postpone urination?: Yes Burning/pain with urination?: No Get up at night to urinate?: Yes Leakage of urine?: No Urine stream starts and stops?: No Trouble starting stream?: No Do you have to strain to urinate?: No Blood in urine?: No Urinary tract infection?: No Sexually transmitted disease?: No Injury to kidneys or bladder?: No Painful intercourse?: No Weak stream?: Yes Erection problems?: Yes Penile pain?: No  Gastrointestinal Nausea?: No Vomiting?: No Indigestion/heartburn?: No Diarrhea?:  No Constipation?: No  Constitutional Fever: No Night sweats?: No Weight loss?: No Fatigue?: No  Skin Skin rash/lesions?: No Itching?: No  Eyes Blurred vision?: No Double vision?: No  Ears/Nose/Throat Sore throat?: No Sinus problems?: Yes  Hematologic/Lymphatic Swollen glands?: No Easy bruising?: No  Cardiovascular Leg swelling?: No Chest pain?: No  Respiratory Cough?: Yes Shortness of breath?: Yes  Endocrine Excessive thirst?: No  Musculoskeletal Back pain?: Yes Joint pain?: No  Neurological Headaches?: No Dizziness?: No  Psychologic Depression?: No Anxiety?: No  Physical Exam: BP (!) 159/89 (BP Location: Right Arm, Patient Position: Sitting, Cuff Size: Large)   Pulse 83   Ht 5\' 8"  (1.727 m)   Wt 208 lb 12.8 oz (94.7 kg)   BMI 31.75 kg/m   Constitutional:  Alert and oriented, No acute distress. HEENT: [place] AT, moist mucus membranes.  Trachea midline, no masses. Cardiovascular: No clubbing, cyanosis, or edema. Respiratory: Normal respiratory effort, no increased work of breathing. GI: Abdomen is soft, nontender, nondistended, no abdominal masses GU: No CVA tenderness.  Skin: No rashes, bruises or suspicious lesions. Lymph: No cervical or inguinal adenopathy. Neurologic: Grossly intact, no focal deficits, moving all 4 extremities. Psychiatric: Normal mood and affect.  Laboratory Data: Lab Results  Component Value Date   WBC 5.0 06/25/2017   HGB 13.9 06/25/2017   HCT 42.6 06/25/2017   MCV 83.6 06/25/2017   PLT 211 06/25/2017    Lab Results  Component Value Date   CREATININE 1.1 06/25/2017    No results found for: PSA  No results found for: TESTOSTERONE  No results found for: HGBA1C  Urinalysis No results found for: COLORURINE, APPEARANCEUR, LABSPEC, PHURINE, GLUCOSEU, HGBUR, BILIRUBINUR, KETONESUR, PROTEINUR, UROBILINOGEN, NITRITE, LEUKOCYTESUR  Assessment & Plan:    1.  Elevated PSA When accounting for finasteride, the patient's  PSA jumped from approximately 6.4-41.2 and just 6 months.  This jump seems to large to be clinically accurate.  I did offer the patient a prostate biopsy versus repeating his PSA in the short-term.  He will repeat his PSA in 1 month and follow-up at that time.  He does have a history of a fluctuating PSA but it has never been this high.  2.  BPH Continue finasteride and Flomax  Return in about 4 weeks (around 10/14/2017) for PSA prior.  Nickie Retort, MD  [facility] [address], [place], [place] [phone]

## 2017-10-14 ENCOUNTER — Other Ambulatory Visit: Payer: Medicare Other

## 2017-10-14 DIAGNOSIS — R972 Elevated prostate specific antigen [PSA]: Secondary | ICD-10-CM

## 2017-10-14 DIAGNOSIS — N400 Enlarged prostate without lower urinary tract symptoms: Secondary | ICD-10-CM

## 2017-10-15 LAB — PSA: PROSTATE SPECIFIC AG, SERUM: 6.5 ng/mL — AB (ref 0.0–4.0)

## 2017-10-21 ENCOUNTER — Ambulatory Visit (INDEPENDENT_AMBULATORY_CARE_PROVIDER_SITE_OTHER): Payer: Medicare Other | Admitting: Urology

## 2017-10-21 VITALS — BP 151/83 | HR 88 | Ht 68.0 in | Wt 210.0 lb

## 2017-10-21 DIAGNOSIS — R972 Elevated prostate specific antigen [PSA]: Secondary | ICD-10-CM | POA: Diagnosis not present

## 2017-10-21 NOTE — Progress Notes (Signed)
10/21/2017 9:41 AM   Colton Nguyen May 13, 1946 [id number]  Referring provider: McLean-Scocuzza, Nino Glow, MD [place], [place] [address]  Chief Complaint  Patient presents with  . Follow-up    HPI: The patient is a 72 year old gentleman with BPH on finasteride presents today for follow-up of his elevated PSA.  His PSA was 8.6 in 2015.  In January 2016 was 11.4.  At his last visit in July 2018, his PSA was 3.2 in June 2018.  Repeat PSA in January 2019 was 20.6 (41.2 when adjusting for finasteride).  There is concerned at his last visit with the significant rise in his PSA despite his fluctuating PSA history.  Repeat PSA was reassuring is a decreased to 6.5 which is corrected to be 13.  Patient is on Flomax and finasteride for his urinary symptoms.  His urinary symptoms are well controlled.  He has no urinary complaints.  He feels that he voids well.  The patient does have family history of prostate cancer.   PMH: Past Medical History:  Diagnosis Date  . Anxiety    situational  . BPH (benign prostatic hyperplasia)   . CAD (coronary artery disease)   . CHF (congestive heart failure) ([facility])   . Chronic cardiopulmonary disease ([facility])   . COPD (chronic obstructive pulmonary disease) ([facility])   . Hyperlipidemia   . Hypertension   . Hypertension 12/28/2016  . lung ca dx'd 03/2015  . Mass of lung   . Nicotine dependence   . Personal history of kidney stones   . Restless leg   . Shortness of breath dyspnea    with exertion    Surgical History: Past Surgical History:  Procedure Laterality Date  . BACK SURGERY    . CAD CCTA 01/31/15    . CERVICAL FUSION    . COLONOSCOPY    . LUNG BIOPSY N/A 02/27/2015   Procedure: LUNG BIOPSY;  Surgeon: Grace Isaac, MD;  Location: [facility];  Service: Thoracic;  Laterality: N/A;  . right inguinal hernia repair at age 89    . VIDEO BRONCHOSCOPY WITH ENDOBRONCHIAL ULTRASOUND N/A 02/27/2015   Procedure: VIDEO BRONCHOSCOPY WITH  ENDOBRONCHIAL ULTRASOUND;  Surgeon: Grace Isaac, MD;  Location: [facility];  Service: Thoracic;  Laterality: N/A;    Home Medications:  Allergies as of 10/21/2017   No Known Allergies     Medication List        Accurate as of 10/21/17  9:41 AM. Always use your most recent med list.          amLODipine 5 MG tablet Commonly known as:  NORVASC   aspirin 81 MG tablet Take 81 mg by mouth daily.   atorvastatin 40 MG tablet Commonly known as:  LIPITOR Take 40 mg by mouth daily.   cetirizine 10 MG tablet Commonly known as:  ZYRTEC Take 10 mg by mouth daily as needed for allergies.   finasteride 5 MG tablet Commonly known as:  PROSCAR Take 1 tablet (5 mg total) by mouth daily.   fluticasone 50 MCG/ACT nasal spray Commonly known as:  FLONASE Place 2 sprays into both nostrils daily as needed for allergies.   losartan 50 MG tablet Commonly known as:  COZAAR Take 50 mg by mouth daily.   SYMBICORT 160-4.5 MCG/ACT inhaler Generic drug:  budesonide-formoterol   tamsulosin 0.4 MG Caps capsule Commonly known as:  FLOMAX Take 0.4 mg by mouth daily.   Vitamin D3 5000 units Tabs Take 5,000 Units by mouth daily.  Allergies: No Known Allergies  Family History: Family History  Problem Relation Age of Onset  . Cancer Unknown   . Heart disease Father   . Hypertension Unknown     Social History:  reports that he has been smoking cigarettes.  He started smoking about 50 years ago. He has a 70.50 pack-year smoking history. he has never used smokeless tobacco. He reports that he drinks alcohol. He reports that he does not use drugs.  ROS: UROLOGY Frequent Urination?: Yes Hard to postpone urination?: Yes Burning/pain with urination?: No Get up at night to urinate?: Yes Leakage of urine?: No Urine stream starts and stops?: No Trouble starting stream?: No Do you have to strain to urinate?: No Blood in urine?: No Urinary tract infection?: No Sexually transmitted  disease?: No Injury to kidneys or bladder?: No Painful intercourse?: No Weak stream?: No Erection problems?: Yes Penile pain?: No  Gastrointestinal Vomiting?: No Indigestion/heartburn?: No Diarrhea?: No Constipation?: No  Constitutional Fever: No Night sweats?: No Weight loss?: No Fatigue?: No  Skin Skin rash/lesions?: No Itching?: No  Eyes Blurred vision?: No Double vision?: No  Ears/Nose/Throat Sore throat?: No Sinus problems?: No  Hematologic/Lymphatic Swollen glands?: No Easy bruising?: No  Cardiovascular Leg swelling?: No Chest pain?: No  Respiratory Cough?: No Shortness of breath?: Yes  Endocrine Excessive thirst?: No  Musculoskeletal Back pain?: No Joint pain?: No  Neurological Headaches?: No Dizziness?: No  Psychologic Depression?: No Anxiety?: No  Physical Exam: BP (!) 151/83   Pulse 88   Ht 5\' 8"  (1.727 m)   Wt 210 lb (95.3 kg)   BMI 31.93 kg/m   Constitutional:  Alert and oriented, No acute distress. HEENT: [place] AT, moist mucus membranes.  Trachea midline, no masses. Cardiovascular: No clubbing, cyanosis, or edema. Respiratory: Normal respiratory effort, no increased work of breathing. GI: Abdomen is soft, nontender, nondistended, no abdominal masses GU: No CVA tenderness.  Skin: No rashes, bruises or suspicious lesions. Lymph: No cervical or inguinal adenopathy. Neurologic: Grossly intact, no focal deficits, moving all 4 extremities. Psychiatric: Normal mood and affect.  Laboratory Data: Lab Results  Component Value Date   WBC 5.0 06/25/2017   HGB 13.9 06/25/2017   HCT 42.6 06/25/2017   MCV 83.6 06/25/2017   PLT 211 06/25/2017    Lab Results  Component Value Date   CREATININE 1.1 06/25/2017    No results found for: PSA  No results found for: TESTOSTERONE  No results found for: HGBA1C  Urinalysis No results found for: COLORURINE, APPEARANCEUR, LABSPEC, PHURINE, GLUCOSEU, HGBUR, BILIRUBINUR, KETONESUR,  PROTEINUR, UROBILINOGEN, NITRITE, LEUKOCYTESUR  Assessment & Plan:    1.  Elevated PSA Patient's PSA did drop significantly in the last month.  At this point, I recommend to continue to trend his PSA.  Would recommend repeating it in 3 months with office follow up  2.  BPH Continue finasteride and Flomax  Return in about 3 months (around 01/18/2018) for PSA prior.  Nickie Retort, MD  [facility] [address], [place], [place] [address] [phone]

## 2017-11-30 ENCOUNTER — Telehealth: Payer: Self-pay | Admitting: Internal Medicine

## 2017-11-30 NOTE — Telephone Encounter (Signed)
[facility] - moved 4/25 f/u to 4/30. Other appointments remain the same. Spoke with patient he is aware.

## 2017-12-27 ENCOUNTER — Ambulatory Visit (HOSPITAL_COMMUNITY)
Admission: RE | Admit: 2017-12-27 | Discharge: 2017-12-27 | Disposition: A | Discharge status: Home / Self Care | Payer: Medicare Other | Source: Ambulatory Visit | Attending: Internal Medicine | Admitting: Internal Medicine

## 2017-12-27 ENCOUNTER — Inpatient Hospital Stay: Payer: Medicare Other | Attending: Internal Medicine

## 2017-12-27 DIAGNOSIS — E279 Disorder of adrenal gland, unspecified: Secondary | ICD-10-CM | POA: Diagnosis not present

## 2017-12-27 DIAGNOSIS — J432 Centrilobular emphysema: Secondary | ICD-10-CM | POA: Diagnosis not present

## 2017-12-27 DIAGNOSIS — I700 Atherosclerosis of aorta: Secondary | ICD-10-CM | POA: Insufficient documentation

## 2017-12-27 DIAGNOSIS — C3411 Malignant neoplasm of upper lobe, right bronchus or lung: Secondary | ICD-10-CM | POA: Diagnosis not present

## 2017-12-27 DIAGNOSIS — Z923 Personal history of irradiation: Secondary | ICD-10-CM | POA: Insufficient documentation

## 2017-12-27 DIAGNOSIS — J449 Chronic obstructive pulmonary disease, unspecified: Secondary | ICD-10-CM | POA: Insufficient documentation

## 2017-12-27 DIAGNOSIS — R918 Other nonspecific abnormal finding of lung field: Secondary | ICD-10-CM | POA: Diagnosis not present

## 2017-12-27 DIAGNOSIS — I10 Essential (primary) hypertension: Secondary | ICD-10-CM | POA: Insufficient documentation

## 2017-12-27 DIAGNOSIS — Z9221 Personal history of antineoplastic chemotherapy: Secondary | ICD-10-CM | POA: Insufficient documentation

## 2017-12-27 DIAGNOSIS — Z720 Tobacco use: Secondary | ICD-10-CM | POA: Diagnosis not present

## 2017-12-27 LAB — CBC WITH DIFFERENTIAL/PLATELET
Basophils Absolute: 0 10*3/uL (ref 0.0–0.1)
Basophils Relative: 0 %
Eosinophils Absolute: 0.2 10*3/uL (ref 0.0–0.5)
Eosinophils Relative: 3 %
HCT: 43.4 % (ref 38.4–49.9)
HEMOGLOBIN: 14.6 g/dL (ref 13.0–17.1)
LYMPHS ABS: 1.3 10*3/uL (ref 0.9–3.3)
LYMPHS PCT: 24 %
MCH: 27.2 pg (ref 27.2–33.4)
MCHC: 33.6 g/dL (ref 32.0–36.0)
MCV: 81 fL (ref 79.3–98.0)
Monocytes Absolute: 0.5 10*3/uL (ref 0.1–0.9)
Monocytes Relative: 8 %
NEUTROS PCT: 65 %
Neutro Abs: 3.6 10*3/uL (ref 1.5–6.5)
Platelets: 214 10*3/uL (ref 140–400)
RBC: 5.36 MIL/uL (ref 4.20–5.82)
RDW: 15.7 % — ABNORMAL HIGH (ref 11.0–14.6)
WBC: 5.6 10*3/uL (ref 4.0–10.3)

## 2017-12-27 LAB — COMPREHENSIVE METABOLIC PANEL
ALT: 13 U/L (ref 0–55)
AST: 19 U/L (ref 5–34)
Albumin: 3.5 g/dL (ref 3.5–5.0)
Alkaline Phosphatase: 88 U/L (ref 40–150)
Anion gap: 9 (ref 3–11)
BUN: 11 mg/dL (ref 7–26)
CHLORIDE: 104 mmol/L (ref 98–109)
CO2: 23 mmol/L (ref 22–29)
Calcium: 9.3 mg/dL (ref 8.4–10.4)
Creatinine, Ser: 1.23 mg/dL (ref 0.70–1.30)
GFR, EST NON AFRICAN AMERICAN: 57 mL/min — AB (ref >60)
Glucose, Bld: 101 mg/dL (ref 70–140)
POTASSIUM: 4.1 mmol/L (ref 3.5–5.1)
Sodium: 136 mmol/L (ref 136–145)
Total Bilirubin: 0.4 mg/dL (ref 0.2–1.2)
Total Protein: 6.8 g/dL (ref 6.4–8.3)

## 2017-12-27 MED ORDER — IOHEXOL 300 MG/ML  SOLN
75.0000 mL | Freq: Once | INTRAMUSCULAR | Status: AC | PRN
  Administered 2017-12-27: 75 mL via INTRAVENOUS

## 2017-12-30 ENCOUNTER — Ambulatory Visit: Payer: Medicare Other | Admitting: Internal Medicine

## 2018-01-04 ENCOUNTER — Encounter: Payer: Self-pay | Admitting: Internal Medicine

## 2018-01-04 ENCOUNTER — Telehealth: Payer: Self-pay

## 2018-01-04 ENCOUNTER — Inpatient Hospital Stay (HOSPITAL_BASED_OUTPATIENT_CLINIC_OR_DEPARTMENT_OTHER): Payer: Medicare Other | Admitting: Internal Medicine

## 2018-01-04 VITALS — BP 145/85 | HR 80 | Temp 99.1°F | Resp 20 | Ht 68.0 in | Wt 214.7 lb

## 2018-01-04 DIAGNOSIS — Z923 Personal history of irradiation: Secondary | ICD-10-CM

## 2018-01-04 DIAGNOSIS — Z9221 Personal history of antineoplastic chemotherapy: Secondary | ICD-10-CM

## 2018-01-04 DIAGNOSIS — J449 Chronic obstructive pulmonary disease, unspecified: Secondary | ICD-10-CM

## 2018-01-04 DIAGNOSIS — Z720 Tobacco use: Secondary | ICD-10-CM

## 2018-01-04 DIAGNOSIS — C3411 Malignant neoplasm of upper lobe, right bronchus or lung: Secondary | ICD-10-CM

## 2018-01-04 DIAGNOSIS — C3490 Malignant neoplasm of unspecified part of unspecified bronchus or lung: Secondary | ICD-10-CM

## 2018-01-04 DIAGNOSIS — I10 Essential (primary) hypertension: Secondary | ICD-10-CM

## 2018-01-04 NOTE — Progress Notes (Signed)
[facility] [phone]   [phone]  OFFICE PROGRESS NOTE  [name], MD [place] [address]  DIAGNOSIS: Stage IIIA (T3, N2, M0) non-small cell lung cancer, squamous cell carcinoma diagnosed in June 2016.  PRIOR THERAPY:  1) Concurrent chemoradiation with weekly carboplatin for an AUC of 2 and paclitaxel 45 mg/m2 with partial response.  2) Consolidation systemic chemotherapy with carboplatin for AUC of 5 and paclitaxel 175 MG/M2 every 3 weeks. First cycle 06/10/2015. Status post 3 cycles.  CURRENT THERAPY: Observation.  INTERVAL HISTORY: Colton Nguyen 72 y.o. male returns to the clinic today for follow-up visit.  The patient is feeling fine with no specific complaints.  He denied having any chest pain but continues to have shortness of breath with exertion with no cough or hemoptysis.  Unfortunately he continues to smoke at regular basis.  He denied having any nausea, vomiting, diarrhea or constipation.  He has no significant weight loss or night sweats.  The patient has no headache or visual changes.  He had a repeat CT scan of the chest performed recently and is here for evaluation and discussion of his discuss results.   MEDICAL HISTORY: Past Medical History:  Diagnosis Date  . Anxiety    situational  . BPH (benign prostatic hyperplasia)   . CAD (coronary artery disease)   . CHF (congestive heart failure) ([facility])   . Chronic cardiopulmonary disease ([facility])   . COPD (chronic obstructive pulmonary disease) ([facility])   . Hyperlipidemia   . Hypertension   . Hypertension 12/28/2016  . lung ca dx'd 03/2015  . Mass of lung   . Nicotine dependence   . Personal history of kidney stones   . Restless leg   . Shortness of breath dyspnea    with exertion    ALLERGIES:  has No Known Allergies.  MEDICATIONS:  Current Outpatient Medications  Medication Sig Dispense Refill  . amLODipine (NORVASC) 5 MG tablet     . aspirin  81 MG tablet Take 81 mg by mouth daily.    Marland Kitchen atorvastatin (LIPITOR) 40 MG tablet Take 40 mg by mouth daily.     . cetirizine (ZYRTEC) 10 MG tablet Take 10 mg by mouth daily as needed for allergies.    . Cholecalciferol (VITAMIN D3) 5000 UNITS TABS Take 5,000 Units by mouth daily.    . finasteride (PROSCAR) 5 MG tablet Take 1 tablet (5 mg total) by mouth daily. 30 tablet 0  . fluticasone (FLONASE) 50 MCG/ACT nasal spray Place 2 sprays into both nostrils daily as needed for allergies.     Marland Kitchen losartan (COZAAR) 50 MG tablet Take 50 mg by mouth daily.    . SYMBICORT 160-4.5 MCG/ACT inhaler     . tamsulosin (FLOMAX) 0.4 MG CAPS capsule Take 0.4 mg by mouth daily.      No current facility-administered medications for this visit.     SURGICAL HISTORY:  Past Surgical History:  Procedure Laterality Date  . BACK SURGERY    . CAD CCTA 01/31/15    . CERVICAL FUSION    . COLONOSCOPY    . LUNG BIOPSY N/A 02/27/2015   Procedure: LUNG BIOPSY;  Surgeon: Grace Isaac, MD;  Location: [facility];  Service: Thoracic;  Laterality: N/A;  . right inguinal hernia repair at age 52    . VIDEO BRONCHOSCOPY WITH ENDOBRONCHIAL ULTRASOUND N/A 02/27/2015   Procedure: VIDEO BRONCHOSCOPY WITH ENDOBRONCHIAL ULTRASOUND;  Surgeon: Grace Isaac,  MD;  [facility];  Service: Thoracic;  Laterality: N/A;    REVIEW OF SYSTEMS:  A comprehensive review of systems was negative except for: Respiratory: positive for dyspnea on exertion   PHYSICAL EXAMINATION: General appearance: alert, cooperative and no distress Head: Normocephalic, without obvious abnormality, atraumatic Neck: no adenopathy, no JVD, supple, symmetrical, trachea midline and thyroid not enlarged, symmetric, no tenderness/mass/nodules Lymph nodes: Cervical, supraclavicular, and axillary nodes normal. Resp: clear to auscultation bilaterally Back: symmetric, no curvature. ROM normal. No CVA tenderness. Cardio: regular rate and rhythm, S1, S2 normal, no murmur,  click, rub or gallop GI: soft, non-tender; bowel sounds normal; no masses,  no organomegaly Extremities: extremities normal, atraumatic, no cyanosis or edema  ECOG PERFORMANCE STATUS: 1 - Symptomatic but completely ambulatory  Blood pressure (!) 145/85, pulse 80, temperature 99.1 F (37.3 C), temperature source Oral, resp. rate 20, height 5\' 8"  (1.727 m), weight 214 lb 11.2 oz (97.4 kg), SpO2 97 %.  LABORATORY DATA: Lab Results  Component Value Date   WBC 5.6 12/27/2017   HGB 14.6 12/27/2017   HCT 43.4 12/27/2017   MCV 81.0 12/27/2017   PLT 214 12/27/2017      Chemistry      Component Value Date/Time   NA 136 12/27/2017 0951   NA 142 06/25/2017 0819   K 4.1 12/27/2017 0951   K 3.5 06/25/2017 0819   CL 104 12/27/2017 0951   CO2 23 12/27/2017 0951   CO2 27 06/25/2017 0819   BUN 11 12/27/2017 0951   BUN 8.2 06/25/2017 0819   CREATININE 1.23 12/27/2017 0951   CREATININE 1.1 06/25/2017 0819      Component Value Date/Time   CALCIUM 9.3 12/27/2017 0951   CALCIUM 8.9 06/25/2017 0819   ALKPHOS 88 12/27/2017 0951   ALKPHOS 97 06/25/2017 0819   AST 19 12/27/2017 0951   AST 17 06/25/2017 0819   ALT 13 12/27/2017 0951   ALT 13 06/25/2017 0819   BILITOT 0.4 12/27/2017 0951   BILITOT 0.50 06/25/2017 0819       RADIOGRAPHIC STUDIES: Ct Chest W Contrast  Result Date: 12/27/2017 CLINICAL DATA:  Patient with history of right upper lobe non-small cell cancer diagnosed in 2016. Patient status post chemotherapy and radiation. Follow-up exam. EXAM: CT CHEST WITH CONTRAST TECHNIQUE: Multidetector CT imaging of the chest was performed during intravenous contrast administration. CONTRAST:  9mL OMNIPAQUE IOHEXOL 300 MG/ML  SOLN COMPARISON:  CT chest 06/25/2017. FINDINGS: Cardiovascular: Normal heart size. Trace fluid superior pericardial recess. Coronary and aortic atherosclerotic calcifications. Mediastinum/Nodes: No enlarged axillary, mediastinal or hilar lymphadenopathy. Small hiatal  hernia. Lungs/Pleura: Central airways are patent. Centrilobular and paraseptal emphysematous change. Stable paramediastinal radiation changes right upper hemithorax. Unchanged 4 mm subpleural nodules right lower lobe (image 97; series 8). Unchanged 4 mm left upper lobe nodule (image 56; series 8). Stable 5 mm subpleural left lower lobe nodule (image 124; series 8). Stable 5 mm subpleural left lower lobe nodule (image 137; series 8). No pleural effusion or pneumothorax. Upper Abdomen: Stable probable vascular anomaly right hepatic lobe (image 144; series 2). Similar appearing 2 cm incompletely visualized right adrenal nodule (image 148; series 2). Musculoskeletal: Thoracic spine degenerative changes. No aggressive or acute appearing osseous lesions. IMPRESSION: 1. Stable post radiation changes paramediastinal right lung. 2. Stable small bilateral pulmonary nodules. 3. Stable right adrenal gland nodule. 4. Aortic Atherosclerosis (ICD10-I70.0) and Emphysema (ICD10-J43.9). Electronically Signed   By: Lovey Newcomer M.D.   On: 12/27/2017 14:33    ASSESSMENT AND  PLAN:  This is a very pleasant 72 years old African-American male with a stage IIIa non-small cell lung cancer status post a course of concurrent chemoradiation followed by consolidation chemotherapy with carboplatin and paclitaxel. The patient has been in observation since 2016 and he is doing fine except for the baseline shortness of breath.  His recent CT scan of the chest showed no concerning findings for disease progression.  I discussed the scan results with the patient and recommended for him to continue on observation with repeat CT scan of the chest in 6 months. For smoking cessation, I strongly encouraged the patient to quit smoking and offered him smoke cessation program. The patient was advised to call immediately if he has any concerning symptoms in the interval. The patient voices understanding of current disease status and treatment options and  is in agreement with the current care plan. All questions were answered. The patient knows to call the clinic with any problems, questions or concerns. We can certainly see the patient much sooner if necessary.  Disclaimer: This note was dictated with voice recognition software. Similar sounding words can inadvertently be transcribed and may not be corrected upon review.

## 2018-01-04 NOTE — Telephone Encounter (Signed)
Printed avs and calender of upcoming appointment. Per 4/30 los

## 2018-01-18 ENCOUNTER — Other Ambulatory Visit: Payer: Medicare Other

## 2018-01-18 DIAGNOSIS — R972 Elevated prostate specific antigen [PSA]: Secondary | ICD-10-CM

## 2018-01-19 LAB — PSA: Prostate Specific Ag, Serum: 6.1 ng/mL — ABNORMAL HIGH (ref 0.0–4.0)

## 2018-01-21 ENCOUNTER — Encounter: Payer: Self-pay | Admitting: Urology

## 2018-01-21 ENCOUNTER — Ambulatory Visit (INDEPENDENT_AMBULATORY_CARE_PROVIDER_SITE_OTHER): Payer: Medicare Other | Admitting: Urology

## 2018-01-21 VITALS — BP 149/81 | HR 79 | Ht 68.0 in | Wt 214.2 lb

## 2018-01-21 DIAGNOSIS — N400 Enlarged prostate without lower urinary tract symptoms: Secondary | ICD-10-CM

## 2018-01-21 DIAGNOSIS — R972 Elevated prostate specific antigen [PSA]: Secondary | ICD-10-CM | POA: Diagnosis not present

## 2018-01-21 NOTE — Progress Notes (Signed)
01/21/2018 9:45 AM   Colton Nguyen 20-Dec-1945 [id number]  Referring provider: [name], MD [place],  [address]  Chief Complaint  Patient presents with  . Follow-up    HPI: The patient is a 72 year old gentleman with BPH on finasteride presents today for follow-up of his elevated PSA.   1. Elevated PSA His PSA was 8.6 in 2015. In January 2016 was 11.4. His PSA was 3.2 in June 2018.Repeat PSA in January 2019was 20.6 (41.2 when adjusting for finasteride).  Repeat PSA was reassuring is a decreased to 6.5 which is corrected to be 13 in February 2019.  It decreased to 6.1 (corrected 12.2) and May 2019.  Patient's PSA has tended to fluctuate over the years.  The patient does have family history of prostate cancer.  2. BPH Patient is on Flomax and finasteride for his urinary symptoms. His urinary symptoms are well controlled. He has no urinary complaints. He feels that he voids well.  He has nocturia approximately 2 times per night.  He has a good stream and feels he empties his bladder.   PMH: Past Medical History:  Diagnosis Date  . Anxiety    situational  . BPH (benign prostatic hyperplasia)   . CAD (coronary artery disease)   . CHF (congestive heart failure) ([facility])   . Chronic cardiopulmonary disease ([place])   . COPD (chronic obstructive pulmonary disease) ([facility])   . Hyperlipidemia   . Hypertension   . Hypertension 12/28/2016  . lung ca dx'd 03/2015  . Mass of lung   . Nicotine dependence   . Personal history of kidney stones   . Restless leg   . Shortness of breath dyspnea    with exertion    Surgical History: Past Surgical History:  Procedure Laterality Date  . BACK SURGERY    . CAD CCTA 01/31/15    . CERVICAL FUSION    . COLONOSCOPY    . LUNG BIOPSY N/A 02/27/2015   Procedure: LUNG BIOPSY;  Surgeon: Grace Isaac, MD;  Location: [facility];  Service: Thoracic;  Laterality: N/A;  . right inguinal hernia repair at age 76    .  VIDEO BRONCHOSCOPY WITH ENDOBRONCHIAL ULTRASOUND N/A 02/27/2015   Procedure: VIDEO BRONCHOSCOPY WITH ENDOBRONCHIAL ULTRASOUND;  Surgeon: Grace Isaac, MD;  Location: [facility];  Service: Thoracic;  Laterality: N/A;    Home Medications:  Allergies as of 01/21/2018   No Known Allergies     Medication List        Accurate as of 01/21/18  9:45 AM. Always use your most recent med list.          amLODipine 5 MG tablet Commonly known as:  NORVASC   aspirin 81 MG tablet Take 81 mg by mouth daily.   atorvastatin 40 MG tablet Commonly known as:  LIPITOR Take 40 mg by mouth daily.   cetirizine 10 MG tablet Commonly known as:  ZYRTEC Take 10 mg by mouth daily as needed for allergies.   finasteride 5 MG tablet Commonly known as:  PROSCAR Take 1 tablet (5 mg total) by mouth daily.   fluticasone 50 MCG/ACT nasal spray Commonly known as:  FLONASE Place 2 sprays into both nostrils daily as needed for allergies.   losartan 100 MG tablet Commonly known as:  COZAAR   rosuvastatin 5 MG tablet Commonly known as:  CRESTOR Take 5 mg by mouth at bedtime.   SYMBICORT 160-4.5 MCG/ACT inhaler Generic drug:  budesonide-formoterol   tamsulosin 0.4  MG Caps capsule Commonly known as:  FLOMAX Take 0.4 mg by mouth daily.   Vitamin D3 5000 units Tabs Take 5,000 Units by mouth daily.       Allergies: No Known Allergies  Family History: Family History  Problem Relation Age of Onset  . Cancer Unknown   . Heart disease Father   . Hypertension Unknown     Social History:  reports that he has been smoking cigarettes.  He started smoking about 50 years ago. He has a 70.50 pack-year smoking history. He has never used smokeless tobacco. He reports that he drinks alcohol. He reports that he does not use drugs.  ROS: UROLOGY Frequent Urination?: Yes Hard to postpone urination?: No Burning/pain with urination?: No Get up at night to urinate?: Yes Leakage of urine?: No Urine stream starts  and stops?: No Trouble starting stream?: No Do you have to strain to urinate?: No Blood in urine?: No Urinary tract infection?: No Sexually transmitted disease?: No Injury to kidneys or bladder?: No Painful intercourse?: No Weak stream?: No Erection problems?: No Penile pain?: No  Gastrointestinal Nausea?: No Vomiting?: No Indigestion/heartburn?: No Diarrhea?: No Constipation?: No  Constitutional Fever: No Night sweats?: No Weight loss?: No Fatigue?: No  Skin Skin rash/lesions?: No Itching?: No  Eyes Blurred vision?: No Double vision?: No  Ears/Nose/Throat Sore throat?: No Sinus problems?: Yes  Hematologic/Lymphatic Swollen glands?: No Easy bruising?: No  Cardiovascular Leg swelling?: No Chest pain?: No  Respiratory Cough?: Yes Shortness of breath?: Yes  Endocrine Excessive thirst?: No  Musculoskeletal Back pain?: Yes Joint pain?: No  Neurological Headaches?: No Dizziness?: No  Psychologic Depression?: No Anxiety?: No  Physical Exam: BP (!) 149/81 (BP Location: Right Arm, Patient Position: Sitting, Cuff Size: Large)   Pulse 79   Ht 5\' 8"  (1.727 m)   Wt 214 lb 3.2 oz (97.2 kg)   SpO2 99%   BMI 32.57 kg/m   Constitutional:  Alert and oriented, No acute distress. HEENT: [place] AT, moist mucus membranes.  Trachea midline, no masses. Cardiovascular: No clubbing, cyanosis, or edema. Respiratory: Normal respiratory effort, no increased work of breathing. GI: Abdomen is soft, nontender, nondistended, no abdominal masses GU: No CVA tenderness.  Skin: No rashes, bruises or suspicious lesions. Lymph: No cervical or inguinal adenopathy. Neurologic: Grossly intact, no focal deficits, moving all 4 extremities. Psychiatric: Normal mood and affect.  Laboratory Data: Lab Results  Component Value Date   WBC 5.6 12/27/2017   HGB 14.6 12/27/2017   HCT 43.4 12/27/2017   MCV 81.0 12/27/2017   PLT 214 12/27/2017    Lab Results  Component Value Date     CREATININE 1.23 12/27/2017    No results found for: PSA  No results found for: TESTOSTERONE  No results found for: HGBA1C  Urinalysis No results found for: COLORURINE, APPEARANCEUR, LABSPEC, PHURINE, GLUCOSEU, HGBUR, BILIRUBINUR, KETONESUR, PROTEINUR, UROBILINOGEN, NITRITE, LEUKOCYTESUR  Assessment & Plan:    1. Elevated PSA The patient's PSA continues to decrease after a significant rise last year.  As his PSA is decreasing, we will continue to monitor conservatively with repeat PSA in 6 months.  If it does start trending higher without his PSA usual fluctuations, we may need to consider prostate biopsy.  2. BPH Continue finasteride and Flomax  Return in about 6 months (around 07/24/2018) for PSA prior.  Nickie Retort, MD  [facility] [address], [place], [place] [address] [phone]

## 2018-02-06 ENCOUNTER — Inpatient Hospital Stay (HOSPITAL_COMMUNITY)
Admission: EM | Admit: 2018-02-06 | Discharge: 2018-02-08 | DRG: 191 | Disposition: A | Discharge status: Home / Self Care | Payer: Medicare Other | Attending: Internal Medicine | Admitting: Internal Medicine

## 2018-02-06 ENCOUNTER — Emergency Department (HOSPITAL_COMMUNITY): Payer: Medicare Other

## 2018-02-06 ENCOUNTER — Other Ambulatory Visit: Payer: Self-pay

## 2018-02-06 ENCOUNTER — Encounter (HOSPITAL_COMMUNITY): Payer: Self-pay | Admitting: *Deleted

## 2018-02-06 DIAGNOSIS — N179 Acute kidney failure, unspecified: Secondary | ICD-10-CM | POA: Diagnosis present

## 2018-02-06 DIAGNOSIS — F419 Anxiety disorder, unspecified: Secondary | ICD-10-CM | POA: Diagnosis present

## 2018-02-06 DIAGNOSIS — Z923 Personal history of irradiation: Secondary | ICD-10-CM

## 2018-02-06 DIAGNOSIS — I10 Essential (primary) hypertension: Secondary | ICD-10-CM | POA: Diagnosis not present

## 2018-02-06 DIAGNOSIS — C3411 Malignant neoplasm of upper lobe, right bronchus or lung: Secondary | ICD-10-CM | POA: Diagnosis not present

## 2018-02-06 DIAGNOSIS — E785 Hyperlipidemia, unspecified: Secondary | ICD-10-CM | POA: Diagnosis present

## 2018-02-06 DIAGNOSIS — Z981 Arthrodesis status: Secondary | ICD-10-CM

## 2018-02-06 DIAGNOSIS — I279 Pulmonary heart disease, unspecified: Secondary | ICD-10-CM | POA: Diagnosis present

## 2018-02-06 DIAGNOSIS — N400 Enlarged prostate without lower urinary tract symptoms: Secondary | ICD-10-CM | POA: Diagnosis present

## 2018-02-06 DIAGNOSIS — Z7982 Long term (current) use of aspirin: Secondary | ICD-10-CM

## 2018-02-06 DIAGNOSIS — Z720 Tobacco use: Secondary | ICD-10-CM | POA: Diagnosis not present

## 2018-02-06 DIAGNOSIS — Z85118 Personal history of other malignant neoplasm of bronchus and lung: Secondary | ICD-10-CM | POA: Diagnosis not present

## 2018-02-06 DIAGNOSIS — I2510 Atherosclerotic heart disease of native coronary artery without angina pectoris: Secondary | ICD-10-CM | POA: Diagnosis present

## 2018-02-06 DIAGNOSIS — Z716 Tobacco abuse counseling: Secondary | ICD-10-CM | POA: Diagnosis not present

## 2018-02-06 DIAGNOSIS — I110 Hypertensive heart disease with heart failure: Secondary | ICD-10-CM | POA: Diagnosis present

## 2018-02-06 DIAGNOSIS — Z7951 Long term (current) use of inhaled steroids: Secondary | ICD-10-CM | POA: Diagnosis not present

## 2018-02-06 DIAGNOSIS — Z87442 Personal history of urinary calculi: Secondary | ICD-10-CM

## 2018-02-06 DIAGNOSIS — J9601 Acute respiratory failure with hypoxia: Secondary | ICD-10-CM | POA: Diagnosis not present

## 2018-02-06 DIAGNOSIS — G2581 Restless legs syndrome: Secondary | ICD-10-CM | POA: Diagnosis present

## 2018-02-06 DIAGNOSIS — J441 Chronic obstructive pulmonary disease with (acute) exacerbation: Principal | ICD-10-CM | POA: Diagnosis present

## 2018-02-06 DIAGNOSIS — J439 Emphysema, unspecified: Secondary | ICD-10-CM | POA: Diagnosis not present

## 2018-02-06 DIAGNOSIS — Z8249 Family history of ischemic heart disease and other diseases of the circulatory system: Secondary | ICD-10-CM | POA: Diagnosis not present

## 2018-02-06 DIAGNOSIS — F17210 Nicotine dependence, cigarettes, uncomplicated: Secondary | ICD-10-CM | POA: Diagnosis present

## 2018-02-06 DIAGNOSIS — Z79899 Other long term (current) drug therapy: Secondary | ICD-10-CM

## 2018-02-06 DIAGNOSIS — I5032 Chronic diastolic (congestive) heart failure: Secondary | ICD-10-CM | POA: Diagnosis present

## 2018-02-06 DIAGNOSIS — J449 Chronic obstructive pulmonary disease, unspecified: Secondary | ICD-10-CM | POA: Diagnosis present

## 2018-02-06 DIAGNOSIS — Z9221 Personal history of antineoplastic chemotherapy: Secondary | ICD-10-CM | POA: Diagnosis not present

## 2018-02-06 DIAGNOSIS — R0602 Shortness of breath: Secondary | ICD-10-CM

## 2018-02-06 LAB — CBC WITH DIFFERENTIAL/PLATELET
BASOS ABS: 0 10*3/uL (ref 0.0–0.1)
Basophils Relative: 0 %
Eosinophils Absolute: 0 10*3/uL (ref 0.0–0.7)
Eosinophils Relative: 0 %
HEMATOCRIT: 39.9 % (ref 39.0–52.0)
Hemoglobin: 13.8 g/dL (ref 13.0–17.0)
LYMPHS PCT: 12 %
Lymphs Abs: 1.5 10*3/uL (ref 0.7–4.0)
MCH: 27.9 pg (ref 26.0–34.0)
MCHC: 34.6 g/dL (ref 30.0–36.0)
MCV: 80.6 fL (ref 78.0–100.0)
MONO ABS: 1 10*3/uL (ref 0.1–1.0)
MONOS PCT: 8 %
NEUTROS ABS: 10.1 10*3/uL — AB (ref 1.7–7.7)
Neutrophils Relative %: 80 %
Platelets: 196 10*3/uL (ref 150–400)
RBC: 4.95 MIL/uL (ref 4.22–5.81)
RDW: 15.1 % (ref 11.5–15.5)
WBC: 12.6 10*3/uL — ABNORMAL HIGH (ref 4.0–10.5)

## 2018-02-06 LAB — BASIC METABOLIC PANEL
Anion gap: 13 (ref 5–15)
BUN: 30 mg/dL — AB (ref 6–20)
CALCIUM: 8.6 mg/dL — AB (ref 8.9–10.3)
CO2: 22 mmol/L (ref 22–32)
Chloride: 97 mmol/L — ABNORMAL LOW (ref 101–111)
Creatinine, Ser: 1.78 mg/dL — ABNORMAL HIGH (ref 0.61–1.24)
GFR calc Af Amer: 43 mL/min — ABNORMAL LOW (ref >60)
GFR, EST NON AFRICAN AMERICAN: 37 mL/min — AB (ref >60)
GLUCOSE: 120 mg/dL — AB (ref 65–99)
POTASSIUM: 4 mmol/L (ref 3.5–5.1)
Sodium: 132 mmol/L — ABNORMAL LOW (ref 135–145)

## 2018-02-06 LAB — I-STAT TROPONIN, ED: Troponin i, poc: 0.01 ng/mL (ref 0.00–0.08)

## 2018-02-06 LAB — BRAIN NATRIURETIC PEPTIDE: B NATRIURETIC PEPTIDE 5: 45.3 pg/mL (ref 0.0–100.0)

## 2018-02-06 LAB — MAGNESIUM: Magnesium: 1.8 mg/dL (ref 1.7–2.4)

## 2018-02-06 MED ORDER — IPRATROPIUM-ALBUTEROL 0.5-2.5 (3) MG/3ML IN SOLN
3.0000 mL | Freq: Four times a day (QID) | RESPIRATORY_TRACT | Status: DC
  Administered 2018-02-06 – 2018-02-08 (×8): 3 mL via RESPIRATORY_TRACT
  Filled 2018-02-06 (×7): qty 3

## 2018-02-06 MED ORDER — IPRATROPIUM-ALBUTEROL 0.5-2.5 (3) MG/3ML IN SOLN
3.0000 mL | Freq: Once | RESPIRATORY_TRACT | Status: AC
  Administered 2018-02-06: 3 mL via RESPIRATORY_TRACT
  Filled 2018-02-06: qty 3

## 2018-02-06 MED ORDER — CYCLOBENZAPRINE HCL 10 MG PO TABS
5.0000 mg | ORAL_TABLET | Freq: Every day | ORAL | Status: DC | PRN
Start: 2018-02-06 — End: 2018-02-07
  Administered 2018-02-06 – 2018-02-07 (×2): 10 mg via ORAL
  Filled 2018-02-06 (×2): qty 1

## 2018-02-06 MED ORDER — AZITHROMYCIN 250 MG PO TABS
500.0000 mg | ORAL_TABLET | Freq: Once | ORAL | Status: AC
  Administered 2018-02-06: 500 mg via ORAL
  Filled 2018-02-06: qty 2

## 2018-02-06 MED ORDER — TAMSULOSIN HCL 0.4 MG PO CAPS
0.4000 mg | ORAL_CAPSULE | Freq: Every day | ORAL | Status: DC
  Administered 2018-02-06: 0.4 mg via ORAL
  Filled 2018-02-06: qty 1

## 2018-02-06 MED ORDER — AZITHROMYCIN 250 MG PO TABS
250.0000 mg | ORAL_TABLET | Freq: Every day | ORAL | Status: DC
  Administered 2018-02-07: 250 mg via ORAL
  Filled 2018-02-06: qty 1

## 2018-02-06 MED ORDER — METHYLPREDNISOLONE SODIUM SUCC 125 MG IJ SOLR
125.0000 mg | Freq: Once | INTRAMUSCULAR | Status: AC
  Administered 2018-02-06: 125 mg via INTRAVENOUS
  Filled 2018-02-06: qty 2

## 2018-02-06 MED ORDER — METHYLPREDNISOLONE SODIUM SUCC 125 MG IJ SOLR
125.0000 mg | Freq: Four times a day (QID) | INTRAMUSCULAR | Status: AC
  Administered 2018-02-06 – 2018-02-07 (×4): 125 mg via INTRAVENOUS
  Filled 2018-02-06 (×4): qty 2

## 2018-02-06 MED ORDER — SODIUM CHLORIDE 0.9 % IV SOLN
1.0000 g | Freq: Once | INTRAVENOUS | Status: AC
  Administered 2018-02-06: 1 g via INTRAVENOUS
  Filled 2018-02-06: qty 10

## 2018-02-06 MED ORDER — AZITHROMYCIN 250 MG PO TABS: 250.0000 mg | ORAL_TABLET | Freq: Every day | ORAL | Status: DC

## 2018-02-06 MED ORDER — SODIUM CHLORIDE 0.9 % IV SOLN
INTRAVENOUS | Status: DC
  Administered 2018-02-06 – 2018-02-08 (×4): via INTRAVENOUS

## 2018-02-06 MED ORDER — VITAMIN D 1000 UNITS PO TABS
5000.0000 [IU] | ORAL_TABLET | Freq: Every day | ORAL | Status: DC
  Administered 2018-02-07 – 2018-02-08 (×2): 5000 [IU] via ORAL
  Filled 2018-02-06 (×2): qty 5

## 2018-02-06 MED ORDER — ENOXAPARIN SODIUM 40 MG/0.4ML ~~LOC~~ SOLN
40.0000 mg | SUBCUTANEOUS | Status: DC
  Administered 2018-02-06 – 2018-02-07 (×2): 40 mg via SUBCUTANEOUS
  Filled 2018-02-06 (×2): qty 0.4

## 2018-02-06 MED ORDER — PREDNISONE 20 MG PO TABS
40.0000 mg | ORAL_TABLET | Freq: Every day | ORAL | Status: DC
  Administered 2018-02-07 – 2018-02-08 (×2): 40 mg via ORAL
  Filled 2018-02-06 (×2): qty 2

## 2018-02-06 MED ORDER — SODIUM CHLORIDE 0.9 % IV SOLN
1.0000 g | INTRAVENOUS | Status: DC
  Administered 2018-02-07: 1 g via INTRAVENOUS
  Filled 2018-02-06 (×2): qty 10

## 2018-02-06 MED ORDER — ALBUTEROL SULFATE (2.5 MG/3ML) 0.083% IN NEBU
5.0000 mg | INHALATION_SOLUTION | Freq: Once | RESPIRATORY_TRACT | Status: AC
  Administered 2018-02-06: 5 mg via RESPIRATORY_TRACT
  Filled 2018-02-06: qty 6

## 2018-02-06 MED ORDER — FINASTERIDE 5 MG PO TABS
5.0000 mg | ORAL_TABLET | Freq: Every day | ORAL | Status: DC
  Administered 2018-02-06 – 2018-02-07 (×2): 5 mg via ORAL
  Filled 2018-02-06 (×2): qty 1

## 2018-02-06 MED ORDER — SODIUM CHLORIDE 0.9 % IV BOLUS
500.0000 mL | Freq: Once | INTRAVENOUS | Status: AC
  Administered 2018-02-06: 500 mL via INTRAVENOUS

## 2018-02-06 MED ORDER — LOSARTAN POTASSIUM 50 MG PO TABS
100.0000 mg | ORAL_TABLET | Freq: Every day | ORAL | Status: DC
  Administered 2018-02-07: 100 mg via ORAL
  Filled 2018-02-06: qty 2

## 2018-02-06 MED ORDER — ROSUVASTATIN CALCIUM 5 MG PO TABS
5.0000 mg | ORAL_TABLET | ORAL | Status: DC
  Administered 2018-02-07: 5 mg via ORAL
  Filled 2018-02-06: qty 1

## 2018-02-06 MED ORDER — SODIUM CHLORIDE 0.9 % IV SOLN
Freq: Once | INTRAVENOUS | Status: AC
  Administered 2018-02-06: 16:00:00 via INTRAVENOUS

## 2018-02-06 MED ORDER — AMLODIPINE BESYLATE 5 MG PO TABS
5.0000 mg | ORAL_TABLET | Freq: Every day | ORAL | Status: DC
  Administered 2018-02-07 – 2018-02-08 (×2): 5 mg via ORAL
  Filled 2018-02-06 (×2): qty 1

## 2018-02-06 MED ORDER — SODIUM CHLORIDE 0.9 % IV SOLN
500.0000 mg | Freq: Once | INTRAVENOUS | Status: DC
  Filled 2018-02-06: qty 500

## 2018-02-06 MED ORDER — MOMETASONE FURO-FORMOTEROL FUM 200-5 MCG/ACT IN AERO
2.0000 | INHALATION_SPRAY | Freq: Two times a day (BID) | RESPIRATORY_TRACT | Status: DC
  Administered 2018-02-06 – 2018-02-08 (×4): 2 via RESPIRATORY_TRACT
  Filled 2018-02-06: qty 8.8

## 2018-02-06 MED ORDER — NICOTINE 21 MG/24HR TD PT24
21.0000 mg | MEDICATED_PATCH | Freq: Every day | TRANSDERMAL | Status: DC
  Administered 2018-02-06 – 2018-02-08 (×3): 21 mg via TRANSDERMAL
  Filled 2018-02-06 (×3): qty 1

## 2018-02-06 MED ORDER — SODIUM CHLORIDE 0.9 % IV SOLN: Freq: Once | INTRAVENOUS | Status: DC

## 2018-02-06 MED ORDER — ASPIRIN 81 MG PO CHEW
81.0000 mg | CHEWABLE_TABLET | Freq: Every day | ORAL | Status: DC
  Administered 2018-02-06 – 2018-02-07 (×2): 81 mg via ORAL
  Filled 2018-02-06 (×2): qty 1

## 2018-02-06 NOTE — ED Notes (Addendum)
ED TO INPATIENT HANDOFF REPORT  Name/Age/Gender Colton Nguyen 72 y.o. male  Code Status   Home/SNF/Other Home  Chief Complaint chest pain; weakness  Level of Care/Admitting Diagnosis ED Disposition    ED Disposition Condition [facility] Area: [place] [[id number]]  Level of Care: Med-Surg [16]  Diagnosis: COPD (chronic obstructive pulmonary disease) ([place]) [182993]  Admitting Physician: Elwyn Reach [[id number]]  Attending Physician: Elwyn Reach [[id number]]  Estimated length of stay: past midnight tomorrow  Certification:: I certify this patient will need inpatient services for at least 2 midnights  PT Class (Do Not Modify): Inpatient [101]  PT Acc Code (Do Not Modify): Private [1]       Medical History Past Medical History:  Diagnosis Date  . Anxiety    situational  . BPH (benign prostatic hyperplasia)   . CAD (coronary artery disease)   . CHF (congestive heart failure) ([place])   . Chronic cardiopulmonary disease ([place])   . COPD (chronic obstructive pulmonary disease) ([place])   . Hyperlipidemia   . Hypertension   . Hypertension 12/28/2016  . lung ca dx'd 03/2015  . Mass of lung   . Nicotine dependence   . Personal history of kidney stones   . Restless leg   . Shortness of breath dyspnea    with exertion    Allergies No Known Allergies  IV Location/Drains/Wounds Patient Lines/Drains/Airways Status   Active Line/Drains/Airways    Name:   Placement date:   Placement time:   Site:   Days:   Peripheral IV 02/06/18 Right Hand   02/06/18    1602    Hand   less than 1          Labs/Imaging Results for orders placed or performed during the hospital encounter of 02/06/18 (from the past 48 hour(s))  Basic metabolic panel     Status: Abnormal   Collection Time: 02/06/18  3:26 PM  Result Value Ref Range   Sodium 132 (L) 135 - 145 mmol/L   Potassium 4.0 3.5 - 5.1 mmol/L   Chloride 97 (L) 101 - 111 mmol/L   CO2 22 22 - 32  mmol/L   Glucose, Bld 120 (H) 65 - 99 mg/dL   BUN 30 (H) 6 - 20 mg/dL   Creatinine, Ser 1.78 (H) 0.61 - 1.24 mg/dL   Calcium 8.6 (L) 8.9 - 10.3 mg/dL   GFR calc non Af Amer 37 (L) >60 mL/min   GFR calc Af Amer 43 (L) >60 mL/min    Comment: (NOTE) The eGFR has been calculated using the CKD EPI equation. This calculation has not been validated in all clinical situations. eGFR's persistently <60 mL/min signify possible Chronic Kidney Disease.    Anion gap 13 5 - 15    Comment: Performed at [facility], [place] [address] [place],  [address]  CBC with Differential     Status: Abnormal   Collection Time: 02/06/18  3:26 PM  Result Value Ref Range   WBC 12.6 (H) 4.0 - 10.5 K/uL   RBC 4.95 4.22 - 5.81 MIL/uL   Hemoglobin 13.8 13.0 - 17.0 g/dL   HCT 39.9 39.0 - 52.0 %   MCV 80.6 78.0 - 100.0 fL   MCH 27.9 26.0 - 34.0 pg   MCHC 34.6 30.0 - 36.0 g/dL   RDW 15.1 11.5 - 15.5 %   Platelets 196 150 - 400 K/uL   Neutrophils Relative % 80 %   Neutro  Abs 10.1 (H) 1.7 - 7.7 K/uL   Lymphocytes Relative 12 %   Lymphs Abs 1.5 0.7 - 4.0 K/uL   Monocytes Relative 8 %   Monocytes Absolute 1.0 0.1 - 1.0 K/uL   Eosinophils Relative 0 %   Eosinophils Absolute 0.0 0.0 - 0.7 K/uL   Basophils Relative 0 %   Basophils Absolute 0.0 0.0 - 0.1 K/uL    Comment: Performed at [facility], [place] [address], [place], [place] [address]  Brain natriuretic peptide     Status: None   Collection Time: 02/06/18  3:26 PM  Result Value Ref Range   B Natriuretic Peptide 45.3 0.0 - 100.0 pg/mL    Comment: Performed at [facility], [place] [address] [place], [place] [address]  Magnesium     Status: None   Collection Time: 02/06/18  3:26 PM  Result Value Ref Range   Magnesium 1.8 1.7 - 2.4 mg/dL    Comment: Performed at [facility], [place] [address] [place], [place] [address]  I-stat troponin, ED     Status: None   Collection Time: 02/06/18   3:33 PM  Result Value Ref Range   Troponin i, poc 0.01 0.00 - 0.08 ng/mL   Comment 3            Comment: Due to the release kinetics of cTnI, a negative result within the first hours of the onset of symptoms does not rule out myocardial infarction with certainty. If myocardial infarction is still suspected, repeat the test at appropriate intervals.    Dg Chest 2 View  Result Date: 02/06/2018 CLINICAL DATA:  Shortness of breath for the past 3 days. History of COPD and CAD. History of smoking. EXAM: CHEST - 2 VIEW COMPARISON:  Chest CT-12/27/2017; 06/25/2017 FINDINGS: Grossly unchanged cardiac silhouette and mediastinal contours. There is persistent thickening of the right paratracheal stripe with associated mild rightward deviation the tracheal air column at the level the thoracic inlet secondary to the sequela of prior radiation change as demonstrated on recent chest CT. The lungs remain hyperexpanded with flattening the diaphragms mild diffuse slightly nodular thickening of the pulmonary interstitium. No pleural effusion pneumothorax. No evidence of edema. No acute osseus abnormalities. Post lower cervical ACDF, incompletely evaluated. IMPRESSION: 1. Lung hyperexpansion without superimposed acute cardiopulmonary disease. 2. Stable post radiation change involving the medial aspect the right upper lobe with associated volume loss and architectural distortion. Electronically Signed   By: Sandi Mariscal M.D.   On: 02/06/2018 13:35    Pending Labs [employer] (From admission, onward)   Start     Ordered   Signed and Occupational hygienist morning,   R     Signed and Held   Signed and Held  cbc  Tomorrow morning,   R     Signed and Held      Vitals/Pain Today's Vitals   02/06/18 1800 02/06/18 1830 02/06/18 1900 02/06/18 1929  BP: (!) 100/55 (!) 116/55 96/79 117/63  Pulse: 66 66 (!) 102 100  Resp: 20 (!) 31 (!) 30 (!) 34  Temp:    98.3 F (36.8 C)  TempSrc:    Oral  SpO2:  94% 98% 97% 99%  PainSc:    0-No pain    Isolation Precautions No active isolations  Medications Medications  albuterol (PROVENTIL) (2.5 MG/3ML) 0.083% nebulizer solution 5 mg (5 mg Nebulization Given 02/06/18 1449)  methylPREDNISolone sodium succinate (SOLU-MEDROL) 125 mg/2 mL injection  125 mg (125 mg Intravenous Given 02/06/18 1558)  ipratropium-albuterol (DUONEB) 0.5-2.5 (3) MG/3ML nebulizer solution 3 mL (3 mLs Nebulization Given 02/06/18 1552)  0.9 %  sodium chloride infusion ( Intravenous New Bag/Given 02/06/18 1558)  sodium chloride 0.9 % bolus 500 mL (0 mLs Intravenous Stopped 02/06/18 1635)  cefTRIAXone (ROCEPHIN) 1 g in sodium chloride 0.9 % 100 mL IVPB (0 g Intravenous Stopped 02/06/18 1846)  azithromycin (ZITHROMAX) tablet 500 mg (500 mg Oral Given 02/06/18 1814)  ipratropium-albuterol (DUONEB) 0.5-2.5 (3) MG/3ML nebulizer solution 3 mL (3 mLs Nebulization Given 02/06/18 1814)    Mobility Ambulatory

## 2018-02-06 NOTE — ED Triage Notes (Signed)
Pt complains of shortness of breath, weakness, cough x 2 days. Pt denies chest pain.

## 2018-02-06 NOTE — H&P (Signed)
History and Physical    Colton Nguyen ZYS:[id number] DOB: Jun 30, 1946 DOA: 02/06/2018  Referring MD/NP/PA: Dr Ashok Cordia  PCP: [name], MD   Outpatient Specialists: Dr Lorna Few   Patient coming from: Home  Chief Complaint: shortness of breath  HPI: Colton Nguyen is a 72 y.o. male with medical history significant of lung cancer,stage IIIa non-small cell lung cancer s/p carboplatin & paclitaxel, COPD, hypertension, coronary artery disease, diastolic dysfunction CHF and hyperlipidemia who presented to the ER was significant shortness of breath with exertion and at rest.  Patient is also wheezing at home.  He has nebulizer at home that he used but did not get any relief.  He denied any fever or chills denied any nausea vomiting or diarrhea.  Patient was seen in the ER and found to have what appears to be COPD exacerbation.  No hemoptysis.       ED Course: temperature is 90.9 was blood pressure 100/55 pulse 107 respiratory rate of 18-40 and oxygen sat 92% on room air.  He has a white count of 12.6 sodium 132 view and 30 and creatinine 1.78.  Chest x-ray showed no infiltrate.  Patient is being admitted therefore with COPD exacerbation that failed outpatient treatment  Review of Systems: As per HPI otherwise 10 point review of systems negative.    Past Medical History:  Diagnosis Date  . Anxiety    situational  . BPH (benign prostatic hyperplasia)   . CAD (coronary artery disease)   . CHF (congestive heart failure) ([place])   . Chronic cardiopulmonary disease ([place])   . COPD (chronic obstructive pulmonary disease) ([place])   . Hyperlipidemia   . Hypertension   . Hypertension 12/28/2016  . lung ca dx'd 03/2015  . Mass of lung   . Nicotine dependence   . Personal history of kidney stones   . Restless leg   . Shortness of breath dyspnea    with exertion    Past Surgical History:  Procedure Laterality Date  . BACK SURGERY    . CAD CCTA 01/31/15    . CERVICAL FUSION     . COLONOSCOPY    . LUNG BIOPSY N/A 02/27/2015   Procedure: LUNG BIOPSY;  Surgeon: Grace Isaac, MD;  Location: [place];  Service: Thoracic;  Laterality: N/A;  . right inguinal hernia repair at age 16    . VIDEO BRONCHOSCOPY WITH ENDOBRONCHIAL ULTRASOUND N/A 02/27/2015   Procedure: VIDEO BRONCHOSCOPY WITH ENDOBRONCHIAL ULTRASOUND;  Surgeon: Grace Isaac, MD;  Location: [place];  Service: Thoracic;  Laterality: N/A;     reports that he has been smoking cigarettes.  He started smoking about 51 years ago. He has a 70.50 pack-year smoking history. He has never used smokeless tobacco. He reports that he drinks alcohol. He reports that he does not use drugs.  No Known Allergies  Family History  Problem Relation Age of Onset  . Cancer Unknown   . Heart disease Father   . Hypertension Unknown      Prior to Admission medications   Medication Sig Start Date End Date Taking? Authorizing Provider  amLODipine (NORVASC) 5 MG tablet Take 5 mg by mouth daily.  03/27/17  Yes [provider]  aspirin 81 MG tablet Take 81 mg by mouth daily.   Yes [provider]  cetirizine (ZYRTEC) 10 MG tablet Take 10 mg by mouth daily as needed for allergies.   Yes [provider]  Cholecalciferol (VITAMIN D3) 5000 UNITS TABS  Take 5,000 Units by mouth daily.   Yes [provider]  cyclobenzaprine (FLEXERIL) 5 MG tablet Take 5-10 mg by mouth daily as needed for muscle spasms.   Yes [provider]  finasteride (PROSCAR) 5 MG tablet Take 1 tablet (5 mg total) by mouth daily. 11/26/15  Yes McGowan, Larene Beach A, PA-C  fluticasone (FLONASE) 50 MCG/ACT nasal spray Place 2 sprays into both nostrils daily as needed for allergies.  04/02/15  Yes [provider]  losartan (COZAAR) 100 MG tablet Take 100 mg by mouth daily.  12/16/17  Yes [provider]  rosuvastatin (CRESTOR) 5 MG tablet Take 5 mg by mouth 3 (three) times a week. On Monday, Wednesday, Friday 12/30/17   Yes [provider]  SYMBICORT 160-4.5 MCG/ACT inhaler Inhale 2 puffs into the lungs 2 (two) times daily.  09/06/17  Yes [provider]  tamsulosin (FLOMAX) 0.4 MG CAPS capsule Take 0.4 mg by mouth daily.  02/02/15  Yes [provider]    Physical Exam: Vitals:   02/06/18 1630 02/06/18 1700 02/06/18 1730 02/06/18 1800  BP: 108/65 (!) 117/58 129/70 (!) 100/55  Pulse: 100 99 99 66  Resp: (!) 40 18 (!) 26 20  Temp:      TempSrc:      SpO2: 92% 93% 95% 94%      Constitutional: NAD, calm, comfortable Vitals:   02/06/18 1630 02/06/18 1700 02/06/18 1730 02/06/18 1800  BP: 108/65 (!) 117/58 129/70 (!) 100/55  Pulse: 100 99 99 66  Resp: (!) 40 18 (!) 26 20  Temp:      TempSrc:      SpO2: 92% 93% 95% 94%   Eyes: PERRL, lids and conjunctivae normal ENMT: Mucous membranes are moist. Posterior pharynx clear of any exudate or lesions.Normal dentition.  Neck: normal, supple, no masses, no thyromegaly Respiratory: decreased air entry bilaterally with marked expiratory wheezing, no rhonchi Cardiovascular: Regular rate and rhythm, no murmurs / rubs / gallops. No extremity edema. 2+ pedal pulses. No carotid bruits.  Abdomen: no tenderness, no masses palpated. No hepatosplenomegaly. Bowel sounds positive.  Musculoskeletal: no clubbing / cyanosis. No joint deformity upper and lower extremities. Good ROM, no contractures. Normal muscle tone.  Skin: no rashes, lesions, ulcers. No induration Neurologic: CN 2-12 grossly intact. Sensation intact, DTR normal. Strength 5/5 in all 4.  Psychiatric: Normal judgment and insight. Alert and oriented x 3. Normal mood.   Labs on Admission: I have personally reviewed following labs and imaging studies  CBC: Recent Labs  Lab 02/06/18 1526  WBC 12.6*  NEUTROABS 10.1*  HGB 13.8  HCT 39.9  MCV 80.6  PLT 269   Basic Metabolic Panel: Recent Labs  Lab 02/06/18 1526  NA 132*  K 4.0  CL 97*  CO2 22  GLUCOSE 120*  BUN 30*    CREATININE 1.78*  CALCIUM 8.6*  MG 1.8   GFR: CrCl cannot be calculated (Unknown ideal weight.). Liver Function Tests: No results for input(s): AST, ALT, ALKPHOS, BILITOT, PROT, ALBUMIN in the last 168 hours. No results for input(s): LIPASE, AMYLASE in the last 168 hours. No results for input(s): AMMONIA in the last 168 hours. Coagulation Profile: No results for input(s): INR, PROTIME in the last 168 hours. Cardiac Enzymes: No results for input(s): CKTOTAL, CKMB, CKMBINDEX, TROPONINI in the last 168 hours. BNP (last 3 results) No results for input(s): PROBNP in the last 8760 hours. HbA1C: No results for input(s): HGBA1C in the last 72 hours. CBG: No results for  input(s): GLUCAP in the last 168 hours. Lipid Profile: No results for input(s): CHOL, HDL, LDLCALC, TRIG, CHOLHDL, LDLDIRECT in the last 72 hours. Thyroid Function Tests: No results for input(s): TSH, T4TOTAL, FREET4, T3FREE, THYROIDAB in the last 72 hours. Anemia Panel: No results for input(s): VITAMINB12, FOLATE, FERRITIN, TIBC, IRON, RETICCTPCT in the last 72 hours. Urine analysis: No results found for: COLORURINE, APPEARANCEUR, LABSPEC, PHURINE, GLUCOSEU, HGBUR, BILIRUBINUR, KETONESUR, PROTEINUR, UROBILINOGEN, NITRITE, LEUKOCYTESUR Sepsis Labs: @LABRCNTIP (procalcitonin:4,lacticidven:4) )No results found for this or any previous visit (from the past 240 hour(s)).   Radiological Exams on Admission: Dg Chest 2 View  Result Date: 02/06/2018 CLINICAL DATA:  Shortness of breath for the past 3 days. History of COPD and CAD. History of smoking. EXAM: CHEST - 2 VIEW COMPARISON:  Chest CT-12/27/2017; 06/25/2017 FINDINGS: Grossly unchanged cardiac silhouette and mediastinal contours. There is persistent thickening of the right paratracheal stripe with associated mild rightward deviation the tracheal air column at the level the thoracic inlet secondary to the sequela of prior radiation change as demonstrated on recent chest CT. The  lungs remain hyperexpanded with flattening the diaphragms mild diffuse slightly nodular thickening of the pulmonary interstitium. No pleural effusion pneumothorax. No evidence of edema. No acute osseus abnormalities. Post lower cervical ACDF, incompletely evaluated. IMPRESSION: 1. Lung hyperexpansion without superimposed acute cardiopulmonary disease. 2. Stable post radiation change involving the medial aspect the right upper lobe with associated volume loss and architectural distortion. Electronically Signed   By: Sandi Mariscal M.D.   On: 02/06/2018 13:35    EKG: Independently reviewed.  Sinus tachycardia with a rate of 102.  Nonspecific ST changes  Assessment/Plan Principal Problem:   Exacerbation of Emphysema/COPD (HCC) Active Problems:   Squamous cell carcinoma of lung, stage III   Smoking trying to quit   Hypertension   COPD (chronic obstructive pulmonary disease) ([place])     #1 acute COPD exacerbation: Patient appears to be unresponsive to home regimen.  We will use the COPD gold pathway.  IV steroids nebulizer with antibiotics  #2 squamous cell lung cancer: This is currently in remission.  Continue monitoring.  #3 tobacco abuse: Counseling provided.  Nicotine patch ordered.  #4 hypertension: Continue home regimen of blood pressure medications. On Norvasc and losartan.  #5 hyperlipidemia: Continue statin.    DVT prophylaxis: Lovenox  Code Status: full code  Family Communication: wife in the room  Disposition Plan: home when ready  Consults called: None  Admission status: inpatient  Severity of Illness: The appropriate patient status for this patient is INPATIENT. Inpatient status is judged to be reasonable and necessary in order to provide the required intensity of service to ensure the patient's safety. The patient's presenting symptoms, physical exam findings, and initial radiographic and laboratory data in the context of their chronic comorbidities is felt to place them at high  risk for further clinical deterioration. Furthermore, it is not anticipated that the patient will be medically stable for discharge from the hospital within 2 midnights of admission. The following factors support the patient status of inpatient.   " The patient's presenting symptoms include shortness of breath. " The worrisome physical exam findings include might expect any wheezing. " The initial radiographic and laboratory data are worrisome because of no evidence of pneumonia. " The chronic co-morbidities include history of lung cancer.   * I certify that at the point of admission it is my clinical judgment that the patient will require inpatient hospital care spanning beyond 2 midnights from the point of  admission due to high intensity of service, high risk for further deterioration and high frequency of surveillance required.Barbette Merino MD [facility] Pager [phone]  If 7PM-7AM, please contact night-coverage [email or web address] Password [facility]  02/06/2018, 6:36 PM

## 2018-02-06 NOTE — ED Provider Notes (Addendum)
[facility] DEPT Provider Note   CSN: [id number] Arrival date & time: 02/06/18  1246     History   Chief Complaint Chief Complaint  Patient presents with  . Shortness of Breath    HPI Colton Nguyen is a 72 y.o. male with a history of CAD, CHF, COPD, hypertension, hyperlipidemia, lung cancer (stage IIIa non-small cell lung cancer s/p carboplatin & paclitaxel tx that is reported to be stable with most recent CT scan of the chest on 4/22 without evidence of disease progression) who presents the emergency department today for shortness of breath.  Patient reports that he has baseline shortness of breath with exertion.  He is normally able to perform activities of daily living as well as go grocery shopping before coming short of breath.  He notes on Friday this became worse however.  He notes that he became short of breath with anything greater than 15 steps.  He notes that he could hear audible wheezing.  He is try to Symbicort for his symptoms without any relief.  He notes that he still does smoke.  He was given a DuoNeb treatment prior to history taking is unsure if it is helped with his symptoms as he notes that shortness of breath does not occur at rest but only with exertion.  He has a baseline dry cough that has not changed to a productive cough with clear sputum.  He reports no associated fever, nasal congestion, sore throat, chest pain, nausea, vomiting, diaphoresis, hemoptysis, abdominal pain, lower leg swelling.  No echocardiogram on file.  He does not require home oxygen.  He denies prior intubations for COPD.  HPI  Past Medical History:  Diagnosis Date  . Anxiety    situational  . BPH (benign prostatic hyperplasia)   . CAD (coronary artery disease)   . CHF (congestive heart failure) ([place])   . Chronic cardiopulmonary disease ([place])   . COPD (chronic obstructive pulmonary disease) ([place])   . Hyperlipidemia   . Hypertension   . Hypertension  12/28/2016  . lung ca dx'd 03/2015  . Mass of lung   . Nicotine dependence   . Personal history of kidney stones   . Restless leg   . Shortness of breath dyspnea    with exertion    Patient Active Problem List   Diagnosis Date Noted  . Hypertension 12/28/2016  . Emphysema/COPD ([place]) 12/28/2016  . Encounter for antineoplastic chemotherapy 06/03/2015  . Smoking trying to quit 04/01/2015  . Squamous cell carcinoma of lung, stage III 03/07/2015  . spiculated right upper lobe mass 02/14/2015    Past Surgical History:  Procedure Laterality Date  . BACK SURGERY    . CAD CCTA 01/31/15    . CERVICAL FUSION    . COLONOSCOPY    . LUNG BIOPSY N/A 02/27/2015   Procedure: LUNG BIOPSY;  Surgeon: Grace Isaac, MD;  Location: [facility];  Service: Thoracic;  Laterality: N/A;  . right inguinal hernia repair at age 23    . VIDEO BRONCHOSCOPY WITH ENDOBRONCHIAL ULTRASOUND N/A 02/27/2015   Procedure: VIDEO BRONCHOSCOPY WITH ENDOBRONCHIAL ULTRASOUND;  Surgeon: Grace Isaac, MD;  Location: [facility] OR;  Service: Thoracic;  Laterality: N/A;        Home Medications    Prior to Admission medications   Medication Sig Start Date End Date Taking? Authorizing Provider  amLODipine (NORVASC) 5 MG tablet Take 5 mg by mouth daily.  03/27/17  Yes [provider]  aspirin 81 MG  tablet Take 81 mg by mouth daily.   Yes [provider]  cetirizine (ZYRTEC) 10 MG tablet Take 10 mg by mouth daily as needed for allergies.   Yes [provider]  Cholecalciferol (VITAMIN D3) 5000 UNITS TABS Take 5,000 Units by mouth daily.   Yes [provider]  cyclobenzaprine (FLEXERIL) 5 MG tablet Take 5-10 mg by mouth daily as needed for muscle spasms.   Yes [provider]  finasteride (PROSCAR) 5 MG tablet Take 1 tablet (5 mg total) by mouth daily. 11/26/15  Yes McGowan, Larene Beach A, PA-C  fluticasone (FLONASE) 50 MCG/ACT nasal spray Place 2 sprays into both nostrils daily as needed for  allergies.  04/02/15  Yes [provider]  losartan (COZAAR) 100 MG tablet Take 100 mg by mouth daily.  12/16/17  Yes [provider]  rosuvastatin (CRESTOR) 5 MG tablet Take 5 mg by mouth 3 (three) times a week. On Monday, Wednesday, Friday 12/30/17  Yes [provider]  SYMBICORT 160-4.5 MCG/ACT inhaler Inhale 2 puffs into the lungs 2 (two) times daily.  09/06/17  Yes [provider]  tamsulosin (FLOMAX) 0.4 MG CAPS capsule Take 0.4 mg by mouth daily.  02/02/15  Yes [provider]    Family History Family History  Problem Relation Age of Onset  . Cancer Unknown   . Heart disease Father   . Hypertension Unknown     Social History Social History   Tobacco Use  . Smoking status: Current Every Day Smoker    Packs/day: 1.50    Years: 47.00    Pack years: 70.50    Types: Cigarettes    Start date: 02/14/1967  . Smokeless tobacco: Never Used  Substance Use Topics  . Alcohol use: Yes    Alcohol/week: 0.0 oz    Comment: ocassional  . Drug use: No     Allergies   Patient has no known allergies.   Review of Systems Review of Systems  All other systems reviewed and are negative.    Physical Exam Updated Vital Signs BP 129/70   Pulse 99   Temp 99.2 F (37.3 C) (Oral)   Resp (!) 26   SpO2 95%   Physical Exam  Constitutional: He appears well-developed and well-nourished.  HENT:  Head: Normocephalic and atraumatic.  Right Ear: External ear normal.  Left Ear: External ear normal.  Nose: Nose normal.  Mouth/Throat: Uvula is midline, oropharynx is clear and moist and mucous membranes are normal. No tonsillar exudate.  Eyes: Pupils are equal, round, and reactive to light. Right eye exhibits no discharge. Left eye exhibits no discharge. No scleral icterus.  Neck: Trachea normal. Neck supple. No JVD present. No spinous process tenderness present. Carotid bruit is not present. No neck rigidity. Normal range of motion present.    Cardiovascular: Normal rate, regular rhythm and intact distal pulses.  No murmur heard. Pulses:      Radial pulses are 2+ on the right side, and 2+ on the left side.       Dorsalis pedis pulses are 2+ on the right side, and 2+ on the left side.       Posterior tibial pulses are 2+ on the right side, and 2+ on the left side.  No lower extremity swelling or edema. Calves symmetric in size bilaterally.  Pulmonary/Chest: Effort normal. Tachypnea noted. No respiratory distress. He has wheezes in the right lower field and the left lower field. He exhibits no tenderness.  Patient satting at 96%  on room air with good waveform on the monitor.  Mild tachypnea no accessory muscle use, or pursed lip breathing.. Patient is sitting upright, speaking in full sentences without difficulty   Abdominal: Soft. Bowel sounds are normal. There is no tenderness. There is no rebound and no guarding.  Musculoskeletal: He exhibits no edema.  Lymphadenopathy:    He has no cervical adenopathy.  Neurological: He is alert.  Skin: Skin is warm and dry. No rash noted. He is not diaphoretic.  Psychiatric: He has a normal mood and affect.  Nursing note and vitals reviewed.    ED Treatments / Results  Labs (all labs ordered are listed, but only abnormal results are displayed) Labs Reviewed  BASIC METABOLIC PANEL - Abnormal; Notable for the following components:      Result Value   Sodium 132 (*)    Chloride 97 (*)    Glucose, Bld 120 (*)    BUN 30 (*)    Creatinine, Ser 1.78 (*)    Calcium 8.6 (*)    GFR calc non Af Amer 37 (*)    GFR calc Af Amer 43 (*)    All other components within normal limits  CBC WITH DIFFERENTIAL/PLATELET - Abnormal; Notable for the following components:   WBC 12.6 (*)    Neutro Abs 10.1 (*)    All other components within normal limits  BRAIN NATRIURETIC PEPTIDE  MAGNESIUM  I-STAT TROPONIN, ED    EKG EKG Interpretation  Date/Time:  Sunday February 06 2018 13:07:17 EDT Ventricular  Rate:  107 PR Interval:    QRS Duration: 90 QT Interval:  323 QTC Calculation: 431 R Axis:   57 Text Interpretation:  Sinus tachycardia Atrial premature complex Right atrial enlargement Since last tracing rate faster Confirmed by Daleen Bo [phone]) on 02/06/2018 1:15:10 PM   Radiology Dg Chest 2 View  Result Date: 02/06/2018 CLINICAL DATA:  Shortness of breath for the past 3 days. History of COPD and CAD. History of smoking. EXAM: CHEST - 2 VIEW COMPARISON:  Chest CT-12/27/2017; 06/25/2017 FINDINGS: Grossly unchanged cardiac silhouette and mediastinal contours. There is persistent thickening of the right paratracheal stripe with associated mild rightward deviation the tracheal air column at the level the thoracic inlet secondary to the sequela of prior radiation change as demonstrated on recent chest CT. The lungs remain hyperexpanded with flattening the diaphragms mild diffuse slightly nodular thickening of the pulmonary interstitium. No pleural effusion pneumothorax. No evidence of edema. No acute osseus abnormalities. Post lower cervical ACDF, incompletely evaluated. IMPRESSION: 1. Lung hyperexpansion without superimposed acute cardiopulmonary disease. 2. Stable post radiation change involving the medial aspect the right upper lobe with associated volume loss and architectural distortion. Electronically Signed   By: Sandi Mariscal M.D.   On: 02/06/2018 13:35    Procedures Procedures (including critical care time) CRITICAL CARE Performed by: Jillyn Ledger   Total critical care time: 35 minutes  Critical care time was exclusive of separately billable procedures and treating other patients.  Critical care was necessary to treat or prevent imminent or life-threatening deterioration.  Critical care was time spent personally by me on the following activities: development of treatment plan with patient and/or surrogate as well as nursing, discussions with consultants, evaluation of patient's  response to treatment, examination of patient, obtaining history from patient or surrogate, ordering and performing treatments and interventions, ordering and review of laboratory studies, ordering and review of radiographic studies, pulse oximetry and re-evaluation of patient's condition.   Medications Ordered in  ED Medications  tamsulosin (FLOMAX) capsule 0.4 mg (has no administration in time range)  aspirin chewable tablet 81 mg (has no administration in time range)  cholecalciferol (VITAMIN D) tablet 5,000 Units (has no administration in time range)  finasteride (PROSCAR) tablet 5 mg (has no administration in time range)  amLODipine (NORVASC) tablet 5 mg (has no administration in time range)  mometasone-formoterol (DULERA) 200-5 MCG/ACT inhaler 2 puff (2 puffs Inhalation Not Given 02/06/18 2041)  losartan (COZAAR) tablet 100 mg (has no administration in time range)  rosuvastatin (CRESTOR) tablet 5 mg (has no administration in time range)  cyclobenzaprine (FLEXERIL) tablet 5-10 mg (has no administration in time range)  cefTRIAXone (ROCEPHIN) 1 g in sodium chloride 0.9 % 100 mL IVPB (has no administration in time range)  nicotine (NICODERM CQ - dosed in mg/24 hours) patch 21 mg (has no administration in time range)  methylPREDNISolone sodium succinate (SOLU-MEDROL) 125 mg/2 mL injection 125 mg (has no administration in time range)    Followed by  predniSONE (DELTASONE) tablet 40 mg (has no administration in time range)  0.9 %  sodium chloride infusion ( Intravenous New Bag/Given 02/06/18 2031)  enoxaparin (LOVENOX) injection 40 mg (has no administration in time range)  azithromycin (ZITHROMAX) tablet 250 mg (has no administration in time range)  ipratropium-albuterol (DUONEB) 0.5-2.5 (3) MG/3ML nebulizer solution 3 mL (3 mLs Nebulization Given 02/06/18 2039)  albuterol (PROVENTIL) (2.5 MG/3ML) 0.083% nebulizer solution 5 mg (5 mg Nebulization Given 02/06/18 1449)  methylPREDNISolone sodium  succinate (SOLU-MEDROL) 125 mg/2 mL injection 125 mg (125 mg Intravenous Given 02/06/18 1558)  ipratropium-albuterol (DUONEB) 0.5-2.5 (3) MG/3ML nebulizer solution 3 mL (3 mLs Nebulization Given 02/06/18 1552)  0.9 %  sodium chloride infusion ( Intravenous New Bag/Given 02/06/18 1558)  sodium chloride 0.9 % bolus 500 mL (0 mLs Intravenous Stopped 02/06/18 1635)  cefTRIAXone (ROCEPHIN) 1 g in sodium chloride 0.9 % 100 mL IVPB (0 g Intravenous Stopped 02/06/18 1846)  azithromycin (ZITHROMAX) tablet 500 mg (500 mg Oral Given 02/06/18 1814)  ipratropium-albuterol (DUONEB) 0.5-2.5 (3) MG/3ML nebulizer solution 3 mL (3 mLs Nebulization Given 02/06/18 1814)     Initial Impression / Assessment and Plan / ED Course  I have reviewed the triage vital signs and the nursing notes.  Pertinent labs & imaging results that were available during my care of the patient were reviewed by me and considered in my medical decision making (see chart for details).     72 y.o. with increasing shortness of breath with exertion.  He notes he was able to do normal ADLs prior to Friday but now become short of breath with minimal exertion greater than 15 steps.  Patient is tachycardic and tachypneic on arrival.  No fever, hypotension or hypoxia.  He does not require O2 at home.  One breathing treatment prior to my assessment and still remains with global wheezing greatest in the lower lung fields.  Will give IV steroids as well as DuoNeb.  After 2 DuoNeb's and IV steroids, patient feels his symptoms have improved.  EKG without STEMI.  He does have sinus tachycardia. Tn wnl.  He does have a leukocytosis of 12.6.  Elevation in creatinine from baseline to 1.78.  He was given a fluid bolus as well as maintenance fluids in the department.  No signs of CHF exacerbation.  BNP within normal limits no pulmonary edema or cardiomegaly on chest x-ray.  No superimposed or acute cardiopulmonary disease on chest x-ray.  After 2 breathing treatments he feels  his breathing is back at baseline.  However after 10 steps in the department he became hypoxic at 84%.  O2 sats improved after being placed on 2 L.  He is protecting his airway.  Will order another DuoNeb. Will add magnesium level. Patient with a COPD exacerbation with change in cough.  Will cover with azithromycin and Rocephin. Will call for admission.   I appreciate Dr. Jonelle Sidle for admitting the patient to his services. Patient appears safe for admission.   Patient case seen and discussed with Dr. Ashok Cordia who is in agreement with plan.   Final Clinical Impressions(s) / ED Diagnoses   Final diagnoses:  COPD exacerbation [facility])  Shortness of breath    ED Discharge Orders    None       Jillyn Ledger, PA-C 02/06/18 2048    Jillyn Ledger, PA-C 02/06/18 2048    Lajean Saver, MD 02/06/18 2239

## 2018-02-06 NOTE — ED Notes (Signed)
Pt's pulse ox dropped to 84% while ambulating approx 10 feet away from room. Pt placed back on 2L [place]

## 2018-02-07 DIAGNOSIS — J439 Emphysema, unspecified: Secondary | ICD-10-CM

## 2018-02-07 LAB — BASIC METABOLIC PANEL
ANION GAP: 13 (ref 5–15)
BUN: 31 mg/dL — ABNORMAL HIGH (ref 6–20)
CALCIUM: 8 mg/dL — AB (ref 8.9–10.3)
CO2: 20 mmol/L — ABNORMAL LOW (ref 22–32)
Chloride: 100 mmol/L — ABNORMAL LOW (ref 101–111)
Creatinine, Ser: 1.59 mg/dL — ABNORMAL HIGH (ref 0.61–1.24)
GFR calc Af Amer: 49 mL/min — ABNORMAL LOW (ref >60)
GFR, EST NON AFRICAN AMERICAN: 42 mL/min — AB (ref >60)
GLUCOSE: 147 mg/dL — AB (ref 65–99)
Potassium: 4 mmol/L (ref 3.5–5.1)
SODIUM: 133 mmol/L — AB (ref 135–145)

## 2018-02-07 LAB — CBC
HCT: 36.2 % — ABNORMAL LOW (ref 39.0–52.0)
Hemoglobin: 12.1 g/dL — ABNORMAL LOW (ref 13.0–17.0)
MCH: 26.6 pg (ref 26.0–34.0)
MCHC: 33.4 g/dL (ref 30.0–36.0)
MCV: 79.6 fL (ref 78.0–100.0)
Platelets: 217 10*3/uL (ref 150–400)
RBC: 4.55 MIL/uL (ref 4.22–5.81)
RDW: 15.2 % (ref 11.5–15.5)
WBC: 10.8 10*3/uL — AB (ref 4.0–10.5)

## 2018-02-07 MED ORDER — CYCLOBENZAPRINE HCL 5 MG PO TABS
5.0000 mg | ORAL_TABLET | Freq: Three times a day (TID) | ORAL | Status: DC | PRN
  Administered 2018-02-07: 10 mg via ORAL
  Filled 2018-02-07: qty 2

## 2018-02-07 MED ORDER — HYDRALAZINE HCL 20 MG/ML IJ SOLN: 10.0000 mg | Freq: Three times a day (TID) | INTRAMUSCULAR | Status: DC | PRN

## 2018-02-07 MED ORDER — TAMSULOSIN HCL 0.4 MG PO CAPS
0.4000 mg | ORAL_CAPSULE | Freq: Every day | ORAL | Status: DC
  Administered 2018-02-07: 0.4 mg via ORAL
  Filled 2018-02-07: qty 1

## 2018-02-07 NOTE — Progress Notes (Signed)
SATURATION QUALIFICATIONS:   Patient Saturations on Room Air at Rest = 90 %  Patient Saturations on Room Air while Ambulating = 84%  Patient Saturations on 2 Liters of oxygen while Ambulating = 92-94 %  Please briefly explain why patient needs home oxygen: patient was out of bed attempting to walk out into the hallway. Pt became dyspneic and saturation assessed 84% on room air. Pt was placed on 2 liters oxygen, sat increased 94%.

## 2018-02-07 NOTE — Progress Notes (Signed)
PROGRESS NOTE  Colton Nguyen CWU:[id number] DOB: 12/13/1945 DOA: 02/06/2018 PCP: [name], MD  HPI/Recap of past 24 hours: Colton Nguyen is a 72 year old male with medical history significant for non-small cell lung cancer status post carboplatin and paclitaxel, COPD, hypertension, CAD, diastolic HF, hyperlipidemia presents to the ER with significant shortness of breath, wheezing for the past few days.  Patient uses nebulizer at home but did not get any relief and presented to the ER.  Patient denies any fever/chills, chest pain.  In the ED, patient was noted to have leukocytosis, saturating well on room air, chest x-ray showed no infiltrate.  Patient admitted for further management.   Today, met patient sitting up in bed, reports feeling much better.  Denies any worsening shortness of breath, chest pain, abdominal pain, fever/chills, nausea/vomiting.  Assessment/Plan: Principal Problem:   Exacerbation of Emphysema/COPD ([facility]) Active Problems:   Squamous cell carcinoma of lung, stage III   Smoking trying to quit   Hypertension   COPD (chronic obstructive pulmonary disease) ([facility])  Acute COPD exacerbation Afebrile, resolving leukocytosis Chest x-ray showed lung hyperexpansion without any acute cardiopulmonary disease Continue steroids, inhalers, nebulizer Continue azithromycin, ceftriaxone x5 days Supplemental oxygen as needed Monitor closely  AKI Improving Continue hydration Held home losartan Daily BMP  Hypertension Stable Continue amlodipine, held home losartan PRN IV hydralazine  Hyperlipidemia Continue statins  BPH Continue Flomax  Squamous cell lung CA In remission Oncology follow-up  Tobacco abuse Patient still smokes 2 packs/day Extensive counseling done Nicotine patch    Code Status: Full  Family Communication: None at bedside  Disposition Plan: Home by  02/08/2018   Consultants:  None  Procedures:  None  Antimicrobials:  Azithromycin  Ceftriaxone  DVT prophylaxis: Lovenox   Objective: Vitals:   02/07/18 0742 02/07/18 0923 02/07/18 1200 02/07/18 1417  BP:  (!) 119/56  106/66  Pulse:  97  98  Resp:  18  20  Temp:    98 F (36.7 C)  TempSrc:    Oral  SpO2: 92% 98% 91% 95%  Weight:      Height:        Intake/Output Summary (Last 24 hours) at 02/07/2018 1514 Last data filed at 02/07/2018 0930 Gross per 24 hour  Intake 1290 ml  Output 800 ml  Net 490 ml   Filed Weights   02/07/18 0043  Weight: 97.2 kg (214 lb 3.2 oz)    Exam:   General: NAD  Cardiovascular: S1, S2 present  Respiratory: Diminished breath sounds bilaterally, bilateral wheezing noted  Abdomen: Soft, nontender, nondistended, bowel sounds present  Musculoskeletal: No pedal edema bilaterally  Skin: Normal  Psychiatry: Normal mood   Data Reviewed: CBC: Recent Labs  Lab 02/06/18 1526 02/07/18 0529  WBC 12.6* 10.8*  NEUTROABS 10.1*  --   HGB 13.8 12.1*  HCT 39.9 36.2*  MCV 80.6 79.6  PLT 196 388   Basic Metabolic Panel: Recent Labs  Lab 02/06/18 1526 02/07/18 0529  NA 132* 133*  K 4.0 4.0  CL 97* 100*  CO2 22 20*  GLUCOSE 120* 147*  BUN 30* 31*  CREATININE 1.78* 1.59*  CALCIUM 8.6* 8.0*  MG 1.8  --    GFR: Estimated Creatinine Clearance: 48.2 mL/min (A) (by C-G formula based on SCr of 1.59 mg/dL (H)). Liver Function Tests: No results for input(s): AST, ALT, ALKPHOS, BILITOT, PROT, ALBUMIN in the last 168 hours. No results for input(s): LIPASE, AMYLASE in the last 168 hours. No results for input(s):  AMMONIA in the last 168 hours. Coagulation Profile: No results for input(s): INR, PROTIME in the last 168 hours. Cardiac Enzymes: No results for input(s): CKTOTAL, CKMB, CKMBINDEX, TROPONINI in the last 168 hours. BNP (last 3 results) No results for input(s): PROBNP in the last 8760 hours. HbA1C: No results for input(s):  HGBA1C in the last 72 hours. CBG: No results for input(s): GLUCAP in the last 168 hours. Lipid Profile: No results for input(s): CHOL, HDL, LDLCALC, TRIG, CHOLHDL, LDLDIRECT in the last 72 hours. Thyroid Function Tests: No results for input(s): TSH, T4TOTAL, FREET4, T3FREE, THYROIDAB in the last 72 hours. Anemia Panel: No results for input(s): VITAMINB12, FOLATE, FERRITIN, TIBC, IRON, RETICCTPCT in the last 72 hours. Urine analysis: No results found for: COLORURINE, APPEARANCEUR, LABSPEC, PHURINE, GLUCOSEU, HGBUR, BILIRUBINUR, KETONESUR, PROTEINUR, UROBILINOGEN, NITRITE, LEUKOCYTESUR Sepsis Labs: _0 (procalcitonin:4,lacticidven:4)  )No results found for this or any previous visit (from the past 240 hour(s)).    Studies: No results found.  Scheduled Meds: . amLODipine  5 mg Oral Daily  . aspirin  81 mg Oral QHS  . azithromycin  250 mg Oral q1800  . cholecalciferol  5,000 Units Oral Daily  . enoxaparin (LOVENOX) injection  40 mg Subcutaneous Q24H  . finasteride  5 mg Oral QHS  . ipratropium-albuterol  3 mL Nebulization QID  . losartan  100 mg Oral Daily  . methylPREDNISolone (SOLU-MEDROL) injection  125 mg Intravenous Q6H   Followed by  . predniSONE  40 mg Oral Q supper  . mometasone-formoterol  2 puff Inhalation BID  . nicotine  21 mg Transdermal Daily  . rosuvastatin  5 mg Oral Once per day on Mon Wed Fri  . tamsulosin  0.4 mg Oral QHS    Continuous Infusions: . sodium chloride 100 mL/hr at 02/07/18 0943  . cefTRIAXone (ROCEPHIN)  IV       LOS: 1 day     Alma Friendly, MD [facility]   If 7PM-7AM, please contact night-coverage [email or web address] Password [facility] 02/07/2018, 3:14 PM

## 2018-02-07 NOTE — Progress Notes (Signed)
Nutrition Brief Note  Rd consulted under COPD GOLD protocol.   Wt Readings from Last 15 Encounters:  02/07/18 214 lb 3.2 oz (97.2 kg)  01/21/18 214 lb 3.2 oz (97.2 kg)  01/04/18 214 lb 11.2 oz (97.4 kg)  10/21/17 210 lb (95.3 kg)  09/16/17 208 lb 12.8 oz (94.7 kg)  06/28/17 209 lb 4.8 oz (94.9 kg)  03/15/17 209 lb 6.4 oz (95 kg)  12/28/16 207 lb 9.6 oz (94.2 kg)  06/23/16 205 lb 11.2 oz (93.3 kg)  02/17/16 203 lb 12.8 oz (92.4 kg)  11/14/15 202 lb 11.2 oz (91.9 kg)  08/12/15 203 lb 1.6 oz (92.1 kg)  07/22/15 204 lb 12.8 oz (92.9 kg)  07/08/15 206 lb 4.8 oz (93.6 kg)  06/06/15 206 lb 14.4 oz (93.8 kg)   Pt admitted for COPD exacerbation. Reports having slight loss in appetite a couple days PTA but this has since resolved. He consumed an omelet, yogurt, and breakfast potatoes without complication this morning. He does not wish to have supplementation. Endorses a recent 10 lb weight gain over the last couple of months due to increased PO intake. States he would like to lose back to his UBW of 205 lb. No skin breakdown recorded.   Body mass index is 32.57 kg/m. Patient meets criteria for obese based on current BMI.   Current diet order is regular, patient is consuming approximately 100% of meals at this time. Labs and medications reviewed.   No nutrition interventions warranted at this time. If nutrition issues arise, please consult RD.   Mariana Single RD, LDN Clinical Nutrition Pager # [phone]

## 2018-02-08 DIAGNOSIS — J9601 Acute respiratory failure with hypoxia: Secondary | ICD-10-CM

## 2018-02-08 LAB — BASIC METABOLIC PANEL
ANION GAP: 9 (ref 5–15)
BUN: 31 mg/dL — ABNORMAL HIGH (ref 6–20)
CALCIUM: 8.1 mg/dL — AB (ref 8.9–10.3)
CO2: 22 mmol/L (ref 22–32)
Chloride: 107 mmol/L (ref 101–111)
Creatinine, Ser: 1.23 mg/dL (ref 0.61–1.24)
GFR calc Af Amer: >60 mL/min (ref >60)
GFR calc non Af Amer: 57 mL/min — ABNORMAL LOW (ref >60)
GLUCOSE: 149 mg/dL — AB (ref 65–99)
Potassium: 4.4 mmol/L (ref 3.5–5.1)
Sodium: 138 mmol/L (ref 135–145)

## 2018-02-08 LAB — CBC WITH DIFFERENTIAL/PLATELET
BASOS ABS: 0 10*3/uL (ref 0.0–0.1)
Basophils Relative: 0 %
Eosinophils Absolute: 0 10*3/uL (ref 0.0–0.7)
Eosinophils Relative: 0 %
HEMATOCRIT: 35.5 % — AB (ref 39.0–52.0)
Hemoglobin: 11.6 g/dL — ABNORMAL LOW (ref 13.0–17.0)
LYMPHS ABS: 0.4 10*3/uL — AB (ref 0.7–4.0)
Lymphocytes Relative: 3 %
MCH: 26.5 pg (ref 26.0–34.0)
MCHC: 32.7 g/dL (ref 30.0–36.0)
MCV: 81.2 fL (ref 78.0–100.0)
MONO ABS: 0.6 10*3/uL (ref 0.1–1.0)
MONOS PCT: 4 %
NEUTROS ABS: 13.8 10*3/uL — AB (ref 1.7–7.7)
Neutrophils Relative %: 93 %
Platelets: 227 10*3/uL (ref 150–400)
RBC: 4.37 MIL/uL (ref 4.22–5.81)
RDW: 15.2 % (ref 11.5–15.5)
WBC: 14.8 10*3/uL — ABNORMAL HIGH (ref 4.0–10.5)

## 2018-02-08 MED ORDER — PREDNISONE 10 MG PO TABS: ORAL_TABLET | ORAL | 0 refills | Status: DC

## 2018-02-08 MED ORDER — NICOTINE 21 MG/24HR TD PT24: 21.0000 mg | MEDICATED_PATCH | Freq: Every day | TRANSDERMAL | 0 refills | Status: DC

## 2018-02-08 MED ORDER — TIOTROPIUM BROMIDE MONOHYDRATE 18 MCG IN CAPS: 18.0000 ug | ORAL_CAPSULE | Freq: Every day | RESPIRATORY_TRACT | 0 refills | Status: DC

## 2018-02-08 MED ORDER — POLYETHYLENE GLYCOL 3350 17 G PO PACK: 17.0000 g | PACK | Freq: Two times a day (BID) | ORAL | Status: DC

## 2018-02-08 MED ORDER — AZITHROMYCIN 250 MG PO TABS: 250.0000 mg | ORAL_TABLET | Freq: Every day | ORAL | 0 refills | Status: AC

## 2018-02-08 NOTE — Discharge Summary (Signed)
Discharge Summary  Colton Nguyen YWV:[id number] DOB: 09/27/1945  PCP: [name], MD  Admit date: 02/06/2018 Discharge date: 02/08/2018  Time spent: 40 mins  Recommendations for Outpatient Follow-up:  1. PCP  Discharge Diagnoses:  Active Hospital Problems   Diagnosis Date Noted  . Exacerbation of Emphysema/COPD ([place]) 12/28/2016  . COPD (chronic obstructive pulmonary disease) ([place]) 02/06/2018  . Hypertension 12/28/2016  . Smoking trying to quit 04/01/2015  . Squamous cell carcinoma of lung, stage III 03/07/2015    Resolved Hospital Problems  No resolved problems to display.    Discharge Condition: Stable  Diet recommendation: Heart healthy  Vitals:   02/08/18 1226 02/08/18 1251  BP:  (!) 118/55  Pulse:  79  Resp:  (!) 28  Temp:  98.2 F (36.8 C)  SpO2: 90% 98%    History of present illness:  Colton Nguyen is a 72 year old male with medical history significant for non-small cell lung cancer status post carboplatin and paclitaxel, COPD, hypertension, CAD, diastolic HF, hyperlipidemia presents to the ER with significant shortness of breath, wheezing for the past few days.  Patient uses nebulizer at home but did not get any relief and presented to the ER.  Patient denies any fever/chills, chest pain.  In the ED, patient was noted to have leukocytosis, saturating well on room air, chest x-ray showed no infiltrate.  Patient admitted for further management.   Today, met patient sitting up in bed, reports feeling much better, very eager to go home. Denies any worsening shortness of breath, chest pain, fever/chills. Pt will be needing home O2 due to desaturations on screening. Pt advised extensively to quit smoking and never to smoke near the oxygen tank if at all he needed to smoke.  Hospital Course:  Principal Problem:   Exacerbation of Emphysema/COPD [facility]) Active Problems:   Squamous cell carcinoma of lung, stage III   Smoking trying to quit    Hypertension   COPD (chronic obstructive pulmonary disease) ([facility])  Acute COPD exacerbation Afebrile, fluctuating leukocytosis (pt on steroids) Chest x-ray showed lung hyperexpansion without any acute cardiopulmonary disease Discharge on tapered dose of steroids, symbicort, started pt on spiriva Continue azithromycin for a total of 5 days Discharge on home oxygen and advised never to smoke near oxygen Close follow up with PCP  Acute hypoxic respiratory failure Likely due to COPD, current smoker Desaturates on RA while ambulating Discharge on home oxygen and advised never to smoke near oxygen  AKI Improving Continue oral hydration Held home losartan, bp soft with AKI PCP to consider re-starting once renal function has improved, maybe at a lower dose  Hypertension Stable Continue amlodipine, held home losartan as mentioned above  Hyperlipidemia Continue statins  BPH Continue Flomax  Squamous cell lung CA In remission Oncology follow-up  Tobacco abuse Patient still smokes 2 packs/day, has cut down from 4 packs Extensive counseling done Nicotine patch provided      Procedures:  None  Consultations:  None  Discharge Exam: BP (!) 118/55 (BP Location: Left Arm)   Pulse 79   Temp 98.2 F (36.8 C) (Oral)   Resp (!) 28   Ht 5' 8"  (1.727 m)   Wt 97.2 kg (214 lb 3.2 oz)   SpO2 98%   BMI 32.57 kg/m   General: NAD Cardiovascular: S1, S2 Respiratory: Mild wheezing bilaterally   Discharge Instructions You were cared for by a hospitalist during your hospital stay. If you have any questions about your discharge medications or the care you  received while you were in the hospital after you are discharged, you can call the unit and asked to speak with the hospitalist on call if the hospitalist that took care of you is not available. Once you are discharged, your primary care physician will handle any further medical issues. Please note that NO REFILLS for any  discharge medications will be authorized once you are discharged, as it is imperative that you return to your primary care physician (or establish a relationship with a primary care physician if you do not have one) for your aftercare needs so that they can reassess your need for medications and monitor your lab values.  Discharge Instructions    Diet - low sodium heart healthy   Complete by:  As directed    Increase activity slowly   Complete by:  As directed      Allergies as of 02/08/2018   No Known Allergies     Medication List    STOP taking these medications   losartan 100 MG tablet Commonly known as:  COZAAR     TAKE these medications   amLODipine 5 MG tablet Commonly known as:  NORVASC Take 5 mg by mouth daily.   aspirin 81 MG tablet Take 81 mg by mouth daily.   azithromycin 250 MG tablet Commonly known as:  ZITHROMAX Take 1 tablet (250 mg total) by mouth daily for 3 days.   cetirizine 10 MG tablet Commonly known as:  ZYRTEC Take 10 mg by mouth daily as needed for allergies.   cyclobenzaprine 5 MG tablet Commonly known as:  FLEXERIL Take 5-10 mg by mouth daily as needed for muscle spasms.   finasteride 5 MG tablet Commonly known as:  PROSCAR Take 1 tablet (5 mg total) by mouth daily.   fluticasone 50 MCG/ACT nasal spray Commonly known as:  FLONASE Place 2 sprays into both nostrils daily as needed for allergies.   nicotine 21 mg/24hr patch Commonly known as:  NICODERM CQ - dosed in mg/24 hours Place 1 patch (21 mg total) onto the skin daily. Start taking on:  02/09/2018   predniSONE 10 MG tablet Commonly known as:  DELTASONE Take 4 tablets for daily for 2 days, then 2 tablets daily for 2 days, then 1 tablet daily for 2 days and complete   rosuvastatin 5 MG tablet Commonly known as:  CRESTOR Take 5 mg by mouth 3 (three) times a week. On Monday, Wednesday, Friday   SYMBICORT 160-4.5 MCG/ACT inhaler Generic drug:  budesonide-formoterol Inhale 2 puffs  into the lungs 2 (two) times daily.   tamsulosin 0.4 MG Caps capsule Commonly known as:  FLOMAX Take 0.4 mg by mouth daily.   tiotropium 18 MCG inhalation capsule Commonly known as:  SPIRIVA HANDIHALER Place 1 capsule (18 mcg total) into inhaler and inhale daily.   Vitamin D3 5000 units Tabs Take 5,000 Units by mouth daily.            Durable Medical Equipment  (From admission, onward)        Start     Ordered   02/08/18 1156  For home use only DME oxygen  Once    Question Answer Comment  Mode or (Route) Nasal cannula   Frequency Continuous (stationary and portable oxygen unit needed)   Oxygen delivery system Gas      02/08/18 1155     No Known Allergies Follow-up Information    [name], MD. Schedule an appointment as soon as possible for a visit  in 1 week(s).   Specialty:  Internal Medicine Contact information: [address] [place] [address] [phone]            The results of significant diagnostics from this hospitalization (including imaging, microbiology, ancillary and laboratory) are listed below for reference.    Significant Diagnostic Studies: Dg Chest 2 View  Result Date: 02/06/2018 CLINICAL DATA:  Shortness of breath for the past 3 days. History of COPD and CAD. History of smoking. EXAM: CHEST - 2 VIEW COMPARISON:  Chest CT-12/27/2017; 06/25/2017 FINDINGS: Grossly unchanged cardiac silhouette and mediastinal contours. There is persistent thickening of the right paratracheal stripe with associated mild rightward deviation the tracheal air column at the level the thoracic inlet secondary to the sequela of prior radiation change as demonstrated on recent chest CT. The lungs remain hyperexpanded with flattening the diaphragms mild diffuse slightly nodular thickening of the pulmonary interstitium. No pleural effusion pneumothorax. No evidence of edema. No acute osseus abnormalities. Post lower cervical ACDF, incompletely evaluated. IMPRESSION:  1. Lung hyperexpansion without superimposed acute cardiopulmonary disease. 2. Stable post radiation change involving the medial aspect the right upper lobe with associated volume loss and architectural distortion. Electronically Signed   By: Sandi Mariscal M.D.   On: 02/06/2018 13:35    Microbiology: No results found for this or any previous visit (from the past 240 hour(s)).   Labs: Basic Metabolic Panel: Recent Labs  Lab 02/06/18 1526 02/07/18 0529 02/08/18 0526  NA 132* 133* 138  K 4.0 4.0 4.4  CL 97* 100* 107  CO2 22 20* 22  GLUCOSE 120* 147* 149*  BUN 30* 31* 31*  CREATININE 1.78* 1.59* 1.23  CALCIUM 8.6* 8.0* 8.1*  MG 1.8  --   --    Liver Function Tests: No results for input(s): AST, ALT, ALKPHOS, BILITOT, PROT, ALBUMIN in the last 168 hours. No results for input(s): LIPASE, AMYLASE in the last 168 hours. No results for input(s): AMMONIA in the last 168 hours. CBC: Recent Labs  Lab 02/06/18 1526 02/07/18 0529 02/08/18 0526  WBC 12.6* 10.8* 14.8*  NEUTROABS 10.1*  --  13.8*  HGB 13.8 12.1* 11.6*  HCT 39.9 36.2* 35.5*  MCV 80.6 79.6 81.2  PLT 196 217 227   Cardiac Enzymes: No results for input(s): CKTOTAL, CKMB, CKMBINDEX, TROPONINI in the last 168 hours. BNP: BNP (last 3 results) Recent Labs    02/06/18 1526  BNP 45.3    ProBNP (last 3 results) No results for input(s): PROBNP in the last 8760 hours.  CBG: No results for input(s): GLUCAP in the last 168 hours.     Signed:  Alma Friendly, MD [facility] 02/08/2018, 1:21 PM

## 2018-02-08 NOTE — Progress Notes (Signed)
SATURATION QUALIFICATIONS: (This note is used to comply with regulatory documentation for home oxygen)  Patient Saturations on Room Air at Rest = 83%   Please briefly explain why patient needs home oxygen: O2 Sat 83% on Room Air at rest;  Placed on O2 2L;  O2 Sat: 91-94%

## 2018-02-08 NOTE — Care Management Note (Signed)
Case Management Note  Patient Details  Name: Colton Nguyen MRN: [id number] Date of Birth: May 06, 1946  Subjective/Objective: Noted 02 sats qualify for home 02-home 02 ordered. [place] rep Santiago Glad aware of home 02 order, & d/c today to deliver home 02 travel tank to rm prior d/c. No further CM needs.                   Action/Plan:d/c home w/home 02   Expected Discharge Date:  02/08/18               Expected Discharge Plan:  Home/Self Care  In-House Referral:     Discharge planning Services  CM Consult  Post Acute Care Choice:    Choice offered to:     DME Arranged:  Oxygen DME Agency:  [place]:    [facility] Agency:     Status of Service:  Completed, signed off  If discussed at [place] of Stay Meetings, dates discussed:    Additional Comments:  Dessa Phi, RN 02/08/2018, 2:42 PM

## 2018-07-04 ENCOUNTER — Inpatient Hospital Stay: Payer: Medicare Other | Attending: Internal Medicine

## 2018-07-04 ENCOUNTER — Ambulatory Visit (HOSPITAL_COMMUNITY)
Admission: RE | Admit: 2018-07-04 | Discharge: 2018-07-04 | Disposition: A | Discharge status: Home / Self Care | Payer: Medicare Other | Source: Ambulatory Visit | Attending: Internal Medicine | Admitting: Internal Medicine

## 2018-07-04 DIAGNOSIS — C3490 Malignant neoplasm of unspecified part of unspecified bronchus or lung: Secondary | ICD-10-CM

## 2018-07-04 DIAGNOSIS — Z85118 Personal history of other malignant neoplasm of bronchus and lung: Secondary | ICD-10-CM | POA: Insufficient documentation

## 2018-07-04 DIAGNOSIS — I10 Essential (primary) hypertension: Secondary | ICD-10-CM | POA: Diagnosis not present

## 2018-07-04 DIAGNOSIS — F17210 Nicotine dependence, cigarettes, uncomplicated: Secondary | ICD-10-CM | POA: Diagnosis not present

## 2018-07-04 DIAGNOSIS — J439 Emphysema, unspecified: Secondary | ICD-10-CM | POA: Insufficient documentation

## 2018-07-04 LAB — CMP (CANCER CENTER ONLY)
ALK PHOS: 89 U/L (ref 38–126)
ALT: 13 U/L (ref 0–44)
ANION GAP: 10 (ref 5–15)
AST: 18 U/L (ref 15–41)
Albumin: 3.5 g/dL (ref 3.5–5.0)
BILIRUBIN TOTAL: 0.4 mg/dL (ref 0.3–1.2)
BUN: 12 mg/dL (ref 8–23)
CALCIUM: 9.3 mg/dL (ref 8.9–10.3)
CO2: 27 mmol/L (ref 22–32)
CREATININE: 1.29 mg/dL — AB (ref 0.61–1.24)
Chloride: 104 mmol/L (ref 98–111)
GFR, EST NON AFRICAN AMERICAN: 54 mL/min — AB (ref >60)
Glucose, Bld: 93 mg/dL (ref 70–99)
Potassium: 4.4 mmol/L (ref 3.5–5.1)
Sodium: 141 mmol/L (ref 135–145)
TOTAL PROTEIN: 6.8 g/dL (ref 6.5–8.1)

## 2018-07-04 LAB — CBC WITH DIFFERENTIAL (CANCER CENTER ONLY)
ABS IMMATURE GRANULOCYTES: 0.01 10*3/uL (ref 0.00–0.07)
Basophils Absolute: 0 10*3/uL (ref 0.0–0.1)
Basophils Relative: 1 %
EOS PCT: 3 %
Eosinophils Absolute: 0.2 10*3/uL (ref 0.0–0.5)
HEMATOCRIT: 44.3 % (ref 39.0–52.0)
HEMOGLOBIN: 14.4 g/dL (ref 13.0–17.0)
Immature Granulocytes: 0 %
LYMPHS PCT: 32 %
Lymphs Abs: 2 10*3/uL (ref 0.7–4.0)
MCH: 26.7 pg (ref 26.0–34.0)
MCHC: 32.5 g/dL (ref 30.0–36.0)
MCV: 82.2 fL (ref 80.0–100.0)
MONOS PCT: 9 %
Monocytes Absolute: 0.6 10*3/uL (ref 0.1–1.0)
NEUTROS ABS: 3.4 10*3/uL (ref 1.7–7.7)
Neutrophils Relative %: 55 %
Platelet Count: 239 10*3/uL (ref 150–400)
RBC: 5.39 MIL/uL (ref 4.22–5.81)
RDW: 14 % (ref 11.5–15.5)
WBC: 6.2 10*3/uL (ref 4.0–10.5)
nRBC: 0 % (ref 0.0–0.2)

## 2018-07-04 MED ORDER — IOHEXOL 300 MG/ML  SOLN
75.0000 mL | Freq: Once | INTRAMUSCULAR | Status: AC | PRN
  Administered 2018-07-04: 75 mL via INTRAVENOUS

## 2018-07-04 MED ORDER — SODIUM CHLORIDE 0.9 % IJ SOLN
INTRAMUSCULAR | Status: AC
  Filled 2018-07-04: qty 50

## 2018-07-06 ENCOUNTER — Encounter: Payer: Self-pay | Admitting: Internal Medicine

## 2018-07-06 ENCOUNTER — Other Ambulatory Visit: Payer: Self-pay | Admitting: *Deleted

## 2018-07-06 ENCOUNTER — Telehealth: Payer: Self-pay | Admitting: Internal Medicine

## 2018-07-06 ENCOUNTER — Inpatient Hospital Stay (HOSPITAL_BASED_OUTPATIENT_CLINIC_OR_DEPARTMENT_OTHER): Payer: Medicare Other | Admitting: Internal Medicine

## 2018-07-06 ENCOUNTER — Other Ambulatory Visit: Payer: Medicare Other

## 2018-07-06 ENCOUNTER — Encounter: Payer: Self-pay | Admitting: *Deleted

## 2018-07-06 VITALS — BP 158/83 | HR 78 | Temp 98.7°F | Resp 18 | Ht 68.0 in | Wt 212.3 lb

## 2018-07-06 DIAGNOSIS — Z720 Tobacco use: Secondary | ICD-10-CM

## 2018-07-06 DIAGNOSIS — I10 Essential (primary) hypertension: Secondary | ICD-10-CM | POA: Diagnosis not present

## 2018-07-06 DIAGNOSIS — C3490 Malignant neoplasm of unspecified part of unspecified bronchus or lung: Secondary | ICD-10-CM

## 2018-07-06 DIAGNOSIS — C3411 Malignant neoplasm of upper lobe, right bronchus or lung: Secondary | ICD-10-CM

## 2018-07-06 DIAGNOSIS — Z85118 Personal history of other malignant neoplasm of bronchus and lung: Secondary | ICD-10-CM | POA: Diagnosis not present

## 2018-07-06 DIAGNOSIS — F17210 Nicotine dependence, cigarettes, uncomplicated: Secondary | ICD-10-CM

## 2018-07-06 NOTE — Progress Notes (Signed)
[place] [phone]   [phone]  OFFICE PROGRESS NOTE  [name], MD [place] [address]  DIAGNOSIS: Stage IIIA (T3, N2, M0) non-small cell lung cancer, squamous cell carcinoma diagnosed in June 2016.  PRIOR THERAPY:  1) Concurrent chemoradiation with weekly carboplatin for an AUC of 2 and paclitaxel 45 mg/m2 with partial response.  2) Consolidation systemic chemotherapy with carboplatin for AUC of 5 and paclitaxel 175 MG/M2 every 3 weeks. First cycle 06/10/2015. Status post 3 cycles.  CURRENT THERAPY: Observation.  INTERVAL HISTORY: Colton Nguyen 72 y.o. male returns to the clinic today for six-month follow-up visit.  The patient is feeling fine today with no concerning complaints.  He continues to smoke at regular basis.  He denied having any chest pain but has mild cough with no shortness breath or hemoptysis.  He denied having any recent weight loss or night sweats.  He has no nausea, vomiting, diarrhea or constipation.  The patient had repeat CT scan of the chest performed recently and he is here for evaluation and discussion of his discuss results.    MEDICAL HISTORY: Past Medical History:  Diagnosis Date  . Anxiety    situational  . BPH (benign prostatic hyperplasia)   . CAD (coronary artery disease)   . CHF (congestive heart failure) ([place])   . Chronic cardiopulmonary disease ([place])   . COPD (chronic obstructive pulmonary disease) ([place])   . Hyperlipidemia   . Hypertension   . Hypertension 12/28/2016  . lung ca dx'd 03/2015  . Mass of lung   . Nicotine dependence   . Personal history of kidney stones   . Restless leg   . Shortness of breath dyspnea    with exertion    ALLERGIES:  has No Known Allergies.  MEDICATIONS:  Current Outpatient Medications  Medication Sig Dispense Refill  . amLODipine (NORVASC) 5 MG tablet Take 5 mg by mouth daily.     Marland Kitchen aspirin 81 MG tablet Take 81 mg by mouth  daily.    . cetirizine (ZYRTEC) 10 MG tablet Take 10 mg by mouth daily as needed for allergies.    . Cholecalciferol (VITAMIN D3) 5000 UNITS TABS Take 5,000 Units by mouth daily.    . cyclobenzaprine (FLEXERIL) 5 MG tablet Take 5-10 mg by mouth daily as needed for muscle spasms.    . finasteride (PROSCAR) 5 MG tablet Take 1 tablet (5 mg total) by mouth daily. 30 tablet 0  . fluticasone (FLONASE) 50 MCG/ACT nasal spray Place 2 sprays into both nostrils daily as needed for allergies.     . nicotine (NICODERM CQ - DOSED IN MG/24 HOURS) 21 mg/24hr patch Place 1 patch (21 mg total) onto the skin daily. 28 patch 0  . predniSONE (DELTASONE) 10 MG tablet Take 4 tablets for daily for 2 days, then 2 tablets daily for 2 days, then 1 tablet daily for 2 days and complete 14 tablet 0  . rosuvastatin (CRESTOR) 5 MG tablet Take 5 mg by mouth 3 (three) times a week. On Monday, Wednesday, Friday    . SYMBICORT 160-4.5 MCG/ACT inhaler Inhale 2 puffs into the lungs 2 (two) times daily.     . tamsulosin (FLOMAX) 0.4 MG CAPS capsule Take 0.4 mg by mouth daily.     Marland Kitchen tiotropium (SPIRIVA HANDIHALER) 18 MCG inhalation capsule Place 1 capsule (18 mcg total) into inhaler and inhale daily. 30 capsule 0   No current facility-administered medications  for this visit.     SURGICAL HISTORY:  Past Surgical History:  Procedure Laterality Date  . BACK SURGERY    . CAD CCTA 01/31/15    . CERVICAL FUSION    . COLONOSCOPY    . LUNG BIOPSY N/A 02/27/2015   Procedure: LUNG BIOPSY;  Surgeon: Grace Isaac, MD;  Location: [facility];  Service: Thoracic;  Laterality: N/A;  . right inguinal hernia repair at age 25    . VIDEO BRONCHOSCOPY WITH ENDOBRONCHIAL ULTRASOUND N/A 02/27/2015   Procedure: VIDEO BRONCHOSCOPY WITH ENDOBRONCHIAL ULTRASOUND;  Surgeon: Grace Isaac, MD;  Location: [facility];  Service: Thoracic;  Laterality: N/A;    REVIEW OF SYSTEMS:  A comprehensive review of systems was negative except for: Respiratory: positive  for dyspnea on exertion   PHYSICAL EXAMINATION: General appearance: alert, cooperative and no distress Head: Normocephalic, without obvious abnormality, atraumatic Neck: no adenopathy, no JVD, supple, symmetrical, trachea midline and thyroid not enlarged, symmetric, no tenderness/mass/nodules Lymph nodes: Cervical, supraclavicular, and axillary nodes normal. Resp: clear to auscultation bilaterally Back: symmetric, no curvature. ROM normal. No CVA tenderness. Cardio: regular rate and rhythm, S1, S2 normal, no murmur, click, rub or gallop GI: soft, non-tender; bowel sounds normal; no masses,  no organomegaly Extremities: extremities normal, atraumatic, no cyanosis or edema  ECOG PERFORMANCE STATUS: 1 - Symptomatic but completely ambulatory  Blood pressure (!) 158/83, pulse 78, temperature 98.7 F (37.1 C), temperature source Oral, resp. rate 18, height 5\' 8"  (1.727 m), weight 212 lb 4.8 oz (96.3 kg), SpO2 98 %.  LABORATORY DATA: Lab Results  Component Value Date   WBC 6.2 07/04/2018   HGB 14.4 07/04/2018   HCT 44.3 07/04/2018   MCV 82.2 07/04/2018   PLT 239 07/04/2018      Chemistry      Component Value Date/Time   NA 141 07/04/2018 0744   NA 142 06/25/2017 0819   K 4.4 07/04/2018 0744   K 3.5 06/25/2017 0819   CL 104 07/04/2018 0744   CO2 27 07/04/2018 0744   CO2 27 06/25/2017 0819   BUN 12 07/04/2018 0744   BUN 8.2 06/25/2017 0819   CREATININE 1.29 (H) 07/04/2018 0744   CREATININE 1.1 06/25/2017 0819      Component Value Date/Time   CALCIUM 9.3 07/04/2018 0744   CALCIUM 8.9 06/25/2017 0819   ALKPHOS 89 07/04/2018 0744   ALKPHOS 97 06/25/2017 0819   AST 18 07/04/2018 0744   AST 17 06/25/2017 0819   ALT 13 07/04/2018 0744   ALT 13 06/25/2017 0819   BILITOT 0.4 07/04/2018 0744   BILITOT 0.50 06/25/2017 0819       RADIOGRAPHIC STUDIES: Ct Chest W Contrast  Result Date: 07/04/2018 CLINICAL DATA:  Restaging lung cancer.  Initial diagnosis 2016. EXAM: CT CHEST  WITH CONTRAST TECHNIQUE: Multidetector CT imaging of the chest was performed during intravenous contrast administration. CONTRAST:  73mL OMNIPAQUE IOHEXOL 300 MG/ML  SOLN COMPARISON:  Chest CT 12/27/2017 and 06/25/2017 FINDINGS: Cardiovascular: The heart is normal in size. No pericardial effusion. Stable mild tortuosity and moderate calcification of the thoracic aorta. No dissection. The branch vessels are patent. Stable three-vessel coronary artery calcifications. Mediastinum/Nodes: No mediastinal or hilar mass or lymphadenopathy. Stable soft tissue density in the right hilum and mediastinum consistent with radiation change. No findings suspicious for recurrent tumor. The esophagus is grossly normal. Lungs/Pleura: Stable dense radiation changes involving the right apex and right paramediastinal long. No findings suspicious for recurrent tumor. Stable significant underlying emphysematous changes  and pulmonary scarring. No acute overlying pulmonary process. Stable 4 mm left upper lobe pulmonary nodule on image number 47. A few other scattered small subpleural nodules are unchanged. No new or progressive findings. No pleural effusion. Upper Abdomen: No significant upper abdominal findings. Stable adrenal gland adenomas. Chest wall/musculoskeletal: Stable multinodular goiter. No chest wall mass, supraclavicular or axillary adenopathy. No significant bony findings. IMPRESSION: 1. Stable CT appearance of the chest when compared to multiple prior CT examinations. There are radiation changes involving the right paramediastinal lung, right hilum and mediastinum but no findings suspicious for recurrent tumor. 2. No mediastinal or hilar mass or adenopathy. 3. Stable emphysematous changes and small scattered pulmonary nodules but no new or progressive findings. 4. Stable adrenal gland adenomas. Aortic Atherosclerosis (ICD10-I70.0) and Emphysema (ICD10-J43.9). Electronically Signed   By: Marijo Sanes M.D.   On: 07/04/2018  13:19    ASSESSMENT AND PLAN:  This is a very pleasant 72 years old African-American male with a stage IIIa non-small cell lung cancer status post a course of concurrent chemoradiation followed by consolidation chemotherapy with carboplatin and paclitaxel. The patient is currently on observation and he is feeling fine. He had repeat CT scan of the chest performed recently.  I personally and independently reviewed the scans and discussed the results with the patient today.  His a scan showed no concerning findings for disease recurrence or metastasis. I recommended for the patient to continue on observation with repeat CT scan of the chest in 6 months. For smoking cessation, I strongly encouraged the patient to quit smoking and offered him smoke cessation program. He was advised to call immediately if he has any concerning symptoms in the interval. The patient voices understanding of current disease status and treatment options and is in agreement with the current care plan. All questions were answered. The patient knows to call the clinic with any problems, questions or concerns. We can certainly see the patient much sooner if necessary.  Disclaimer: This note was dictated with voice recognition software. Similar sounding words can inadvertently be transcribed and may not be corrected upon review.

## 2018-07-06 NOTE — Patient Instructions (Signed)
Steps to Quit Smoking Smoking tobacco can be bad for your health. It can also affect almost every organ in your body. Smoking puts you and people around you at risk for many serious long-lasting (chronic) diseases. Quitting smoking is hard, but it is one of the best things that you can do for your health. It is never too late to quit. What are the benefits of quitting smoking? When you quit smoking, you lower your risk for getting serious diseases and conditions. They can include:  Lung cancer or lung disease.  Heart disease.  Stroke.  Heart attack.  Not being able to have children (infertility).  Weak bones (osteoporosis) and broken bones (fractures).  If you have coughing, wheezing, and shortness of breath, those symptoms may get better when you quit. You may also get sick less often. If you are pregnant, quitting smoking can help to lower your chances of having a baby of low birth weight. What can I do to help me quit smoking? Talk with your doctor about what can help you quit smoking. Some things you can do (strategies) include:  Quitting smoking totally, instead of slowly cutting back how much you smoke over a period of time.  Going to in-person counseling. You are more likely to quit if you go to many counseling sessions.  Using resources and support systems, such as: ? Online chats with a counselor. ? Phone quitlines. ? Printed self-help materials. ? Support groups or group counseling. ? Text messaging programs. ? Mobile phone apps or applications.  Taking medicines. Some of these medicines may have nicotine in them. If you are pregnant or breastfeeding, do not take any medicines to quit smoking unless your doctor says it is okay. Talk with your doctor about counseling or other things that can help you.  Talk with your doctor about using more than one strategy at the same time, such as taking medicines while you are also going to in-person counseling. This can help make  quitting easier. What things can I do to make it easier to quit? Quitting smoking might feel very hard at first, but there is a lot that you can do to make it easier. Take these steps:  Talk to your family and friends. Ask them to support and encourage you.  Call phone quitlines, reach out to support groups, or work with a counselor.  Ask people who smoke to not smoke around you.  Avoid places that make you want (trigger) to smoke, such as: ? Bars. ? Parties. ? Smoke-break areas at work.  Spend time with people who do not smoke.  Lower the stress in your life. Stress can make you want to smoke. Try these things to help your stress: ? Getting regular exercise. ? Deep-breathing exercises. ? Yoga. ? Meditating. ? Doing a body scan. To do this, close your eyes, focus on one area of your body at a time from head to toe, and notice which parts of your body are tense. Try to relax the muscles in those areas.  Download or buy apps on your mobile phone or tablet that can help you stick to your quit plan. There are many free apps, such as QuitGuide from the CDC (Centers for Disease Control and Prevention). You can find more support from smokefree.gov and other websites.  This information is not intended to replace advice given to you by your health care provider. Make sure you discuss any questions you have with your health care provider. Document Released: 06/20/2009 Document   Revised: 04/21/2016 Document Reviewed: 01/08/2015 Elsevier Interactive Patient Education  2018 Elsevier Inc.  

## 2018-07-06 NOTE — Telephone Encounter (Signed)
Appts scheduled avs declined / calendar printed per 10/30 los

## 2018-07-21 ENCOUNTER — Other Ambulatory Visit: Payer: Self-pay

## 2018-07-21 DIAGNOSIS — R972 Elevated prostate specific antigen [PSA]: Secondary | ICD-10-CM

## 2018-07-22 ENCOUNTER — Other Ambulatory Visit: Payer: Medicare Other

## 2018-07-22 DIAGNOSIS — R972 Elevated prostate specific antigen [PSA]: Secondary | ICD-10-CM

## 2018-07-23 LAB — PSA: Prostate Specific Ag, Serum: 5.1 ng/mL — ABNORMAL HIGH (ref 0.0–4.0)

## 2018-07-29 ENCOUNTER — Ambulatory Visit: Payer: Medicare Other

## 2018-08-01 ENCOUNTER — Ambulatory Visit (INDEPENDENT_AMBULATORY_CARE_PROVIDER_SITE_OTHER): Payer: Medicare Other | Admitting: Urology

## 2018-08-01 ENCOUNTER — Encounter: Payer: Self-pay | Admitting: Urology

## 2018-08-01 DIAGNOSIS — N400 Enlarged prostate without lower urinary tract symptoms: Secondary | ICD-10-CM | POA: Insufficient documentation

## 2018-08-01 MED ORDER — FINASTERIDE 5 MG PO TABS: 5.0000 mg | ORAL_TABLET | Freq: Every day | ORAL | 11 refills | Status: DC

## 2018-08-01 MED ORDER — TAMSULOSIN HCL 0.4 MG PO CAPS: 0.4000 mg | ORAL_CAPSULE | Freq: Every day | ORAL | 11 refills | Status: DC

## 2018-08-01 NOTE — Progress Notes (Signed)
   08/01/2018 9:49 AM   Hermelinda Dellen Schaafsma 1946-04-20 [id number]  Reason for visit: Follow up elevated PSA  HPI: I saw Mr. Melody Haver in follow-up today in urology clinic.  He was previously followed by Dr. Pilar Jarvis.  He has numerous comorbidities including lung cancer stage IIIa non-small cell treated with chemo and radiation, coronary artery disease, congestive heart failure, oxygen use at home, and ambulates with a cane secondary to a lower extremity injury.  He has a long history of very varied and elevated PSAs, and has never undergone biopsy previously.  He is on finasteride and Flomax.  He thinks he may have an uncle with prostate cancer, but the history is unclear.  PSA was 6.1(corrected 12.2) in May 2019, 6.5(corrected 13) in February 2019, and as high as 20.6(corrected 42) in January 2019.  PSA today continues to downtrend to 5.1(corrected 10.2 ).  There is no cross-sectional imaging to review possible prostate size.  Patient adamantly refuses DRE today  ROS: Please see flowsheet from today's date for complete review of systems.  Physical Exam: BP (!) 145/78 (BP Location: Left Arm, Patient Position: Sitting, Cuff Size: Large)   Pulse 90   Ht 5' 8.5" (1.74 m)   Wt 209 lb 4.8 oz (94.9 kg)   BMI 31.36 kg/m    Constitutional:  Alert and oriented, No acute distress.  Walks with cane Respiratory: Normal respiratory effor GI: Abdomen is soft, nontender, nondistended, no abdominal masses GU: No CVA tenderness DRE: Patient refused Skin: No rashes, bruises or suspicious lesions. Neurologic: Grossly intact, no focal deficits, moving all 4 extremities. Psychiatric: Normal mood and affect  Laboratory Data: PSA 5.1(corrected for finasteride 10.2)  Pertinent Imaging: None to review  Assessment & Plan:   In summary, Mr. Noda is a 72 year old African-American male with numerous medical comorbidities including lung cancer, CAD, CHF, and oxygen use who we are following for elevated  PSA.  We reviewed the implications of an elevated PSA and the uncertainty surrounding it. In general, a man's PSA increases with age and is produced by both normal and cancerous prostate tissue. The differential diagnosis for elevated PSA includes BPH, prostate cancer, infection, recent intercourse/ejaculation, recent urethroscopic manipulation (foley placement/cystoscopy) or trauma, and prostatitis.   Management of an elevated PSA can include observation or prostate biopsy and we discussed this in detail. Our goal is to detect clinically significant prostate cancers, and manage with either active surveillance, surgery, or radiation for localized disease. Risks of prostate biopsy include bleeding, infection (including life threatening sepsis), pain, and lower urinary symptoms. Hematuria, hematospermia, and blood in the stool are all common after biopsy and can persist up to 4 weeks.   We had a long discussion about the risks and benefits of ongoing screening.  We both jointly decided to discontinue PSA screening.  I think this is very reasonable with his numerous comorbidities and stable PSA over the last few years.  I did recommend DRE today to rule out any grossly abnormal prostate exam, however the patient adamantly refused.  Follow-up in 1 year for medical management of BPH, continue Flomax and finasteride Discontinue PSA screening  Billey Co, MD  [facility] [address] [place], [place] [address] [phone]

## 2018-12-09 ENCOUNTER — Telehealth: Payer: Self-pay | Admitting: Internal Medicine

## 2018-12-09 NOTE — Telephone Encounter (Signed)
Returned call and confirmed with patient moving 4/27 lab to 1:00 pm to coordinate with 2 pm ct.

## 2018-12-23 ENCOUNTER — Telehealth: Payer: Self-pay | Admitting: Internal Medicine

## 2018-12-23 NOTE — Telephone Encounter (Signed)
Returned patient's call, patient wanted to confirm time and date. Patient notified.

## 2019-01-02 ENCOUNTER — Inpatient Hospital Stay: Payer: Medicare Other | Attending: Internal Medicine

## 2019-01-02 ENCOUNTER — Ambulatory Visit (HOSPITAL_COMMUNITY): Admission: RE | Admit: 2019-01-02 | Payer: Medicare Other | Source: Ambulatory Visit

## 2019-01-02 ENCOUNTER — Other Ambulatory Visit: Payer: Self-pay

## 2019-01-02 ENCOUNTER — Ambulatory Visit (HOSPITAL_COMMUNITY)
Admission: RE | Admit: 2019-01-02 | Discharge: 2019-01-02 | Disposition: A | Discharge status: Home / Self Care | Payer: Medicare Other | Source: Ambulatory Visit | Attending: Internal Medicine | Admitting: Internal Medicine

## 2019-01-02 DIAGNOSIS — J449 Chronic obstructive pulmonary disease, unspecified: Secondary | ICD-10-CM | POA: Insufficient documentation

## 2019-01-02 DIAGNOSIS — E049 Nontoxic goiter, unspecified: Secondary | ICD-10-CM | POA: Insufficient documentation

## 2019-01-02 DIAGNOSIS — I509 Heart failure, unspecified: Secondary | ICD-10-CM | POA: Insufficient documentation

## 2019-01-02 DIAGNOSIS — Z79899 Other long term (current) drug therapy: Secondary | ICD-10-CM | POA: Insufficient documentation

## 2019-01-02 DIAGNOSIS — C3490 Malignant neoplasm of unspecified part of unspecified bronchus or lung: Secondary | ICD-10-CM | POA: Diagnosis not present

## 2019-01-02 DIAGNOSIS — Z87442 Personal history of urinary calculi: Secondary | ICD-10-CM | POA: Diagnosis not present

## 2019-01-02 DIAGNOSIS — E785 Hyperlipidemia, unspecified: Secondary | ICD-10-CM | POA: Insufficient documentation

## 2019-01-02 DIAGNOSIS — N400 Enlarged prostate without lower urinary tract symptoms: Secondary | ICD-10-CM | POA: Insufficient documentation

## 2019-01-02 DIAGNOSIS — G2581 Restless legs syndrome: Secondary | ICD-10-CM | POA: Diagnosis not present

## 2019-01-02 DIAGNOSIS — I2510 Atherosclerotic heart disease of native coronary artery without angina pectoris: Secondary | ICD-10-CM | POA: Insufficient documentation

## 2019-01-02 DIAGNOSIS — Z7951 Long term (current) use of inhaled steroids: Secondary | ICD-10-CM | POA: Diagnosis not present

## 2019-01-02 DIAGNOSIS — I110 Hypertensive heart disease with heart failure: Secondary | ICD-10-CM | POA: Insufficient documentation

## 2019-01-02 DIAGNOSIS — Z7982 Long term (current) use of aspirin: Secondary | ICD-10-CM | POA: Insufficient documentation

## 2019-01-02 LAB — CBC WITH DIFFERENTIAL (CANCER CENTER ONLY)
Abs Immature Granulocytes: 0.02 10*3/uL (ref 0.00–0.07)
Basophils Absolute: 0 10*3/uL (ref 0.0–0.1)
Basophils Relative: 1 %
Eosinophils Absolute: 0.2 10*3/uL (ref 0.0–0.5)
Eosinophils Relative: 3 %
HCT: 43.1 % (ref 39.0–52.0)
Hemoglobin: 13.9 g/dL (ref 13.0–17.0)
Immature Granulocytes: 0 %
Lymphocytes Relative: 30 %
Lymphs Abs: 1.7 10*3/uL (ref 0.7–4.0)
MCH: 26.2 pg (ref 26.0–34.0)
MCHC: 32.3 g/dL (ref 30.0–36.0)
MCV: 81.2 fL (ref 80.0–100.0)
Monocytes Absolute: 0.4 10*3/uL (ref 0.1–1.0)
Monocytes Relative: 8 %
Neutro Abs: 3.3 10*3/uL (ref 1.7–7.7)
Neutrophils Relative %: 58 %
Platelet Count: 258 10*3/uL (ref 150–400)
RBC: 5.31 MIL/uL (ref 4.22–5.81)
RDW: 15.5 % (ref 11.5–15.5)
WBC Count: 5.6 10*3/uL (ref 4.0–10.5)
nRBC: 0 % (ref 0.0–0.2)

## 2019-01-02 LAB — RESEARCH LABS

## 2019-01-02 LAB — CMP (CANCER CENTER ONLY)
ALT: 13 U/L (ref 0–44)
AST: 18 U/L (ref 15–41)
Albumin: 3.4 g/dL — ABNORMAL LOW (ref 3.5–5.0)
Alkaline Phosphatase: 109 U/L (ref 38–126)
Anion gap: 11 (ref 5–15)
BUN: 10 mg/dL (ref 8–23)
CO2: 26 mmol/L (ref 22–32)
Calcium: 8.7 mg/dL — ABNORMAL LOW (ref 8.9–10.3)
Chloride: 104 mmol/L (ref 98–111)
Creatinine: 1.15 mg/dL (ref 0.61–1.24)
GFR, Est AFR Am: >60 mL/min (ref >60)
GFR, Estimated: >60 mL/min (ref >60)
Glucose, Bld: 100 mg/dL — ABNORMAL HIGH (ref 70–99)
Potassium: 4.1 mmol/L (ref 3.5–5.1)
Sodium: 141 mmol/L (ref 135–145)
Total Bilirubin: 0.4 mg/dL (ref 0.3–1.2)
Total Protein: 6.8 g/dL (ref 6.5–8.1)

## 2019-01-02 MED ORDER — SODIUM CHLORIDE (PF) 0.9 % IJ SOLN
INTRAMUSCULAR | Status: AC
  Filled 2019-01-02: qty 50

## 2019-01-02 MED ORDER — IOHEXOL 300 MG/ML  SOLN
75.0000 mL | Freq: Once | INTRAMUSCULAR | Status: AC | PRN
  Administered 2019-01-02: 15:00:00 75 mL via INTRAVENOUS

## 2019-01-03 ENCOUNTER — Telehealth: Payer: Self-pay | Admitting: Internal Medicine

## 2019-01-03 ENCOUNTER — Ambulatory Visit: Payer: Medicare Other

## 2019-01-03 NOTE — Telephone Encounter (Signed)
Spoke with patient and he wasn't able to webex. Patient said that he would prefer a phone call visit.

## 2019-01-04 ENCOUNTER — Encounter: Payer: Self-pay | Admitting: Internal Medicine

## 2019-01-04 ENCOUNTER — Inpatient Hospital Stay (HOSPITAL_BASED_OUTPATIENT_CLINIC_OR_DEPARTMENT_OTHER): Payer: Medicare Other | Admitting: Internal Medicine

## 2019-01-04 DIAGNOSIS — C3490 Malignant neoplasm of unspecified part of unspecified bronchus or lung: Secondary | ICD-10-CM

## 2019-01-04 NOTE — Progress Notes (Signed)
[facility] [phone]   [phone]) [phone]  PROGRESS NOTE FOR TELEMEDICINE VISITS  [name], MD [place] [address]  I connected with@ on 01/04/19 at  9:00 AM EDT by telephone visit and verified that I am speaking with the correct person using two identifiers.   I discussed the limitations, risks, security and privacy concerns of performing an evaluation and management service by telemedicine and the availability of in-person appointments. I also discussed with the patient that there may be a patient responsible charge related to this service. The patient expressed understanding and agreed to proceed.  Other persons participating in the visit and their role in the encounter: None  Patient's location: Home Provider's location: [place]  DIAGNOSIS: Stage IIIA (T3, N2, M0) non-small cell lung cancer, squamous cell carcinoma diagnosed in June 2016.  PRIOR THERAPY:  1) Concurrent chemoradiation with weekly carboplatin for an AUC of 2 and paclitaxel 45 mg/m2 with partial response.  2) Consolidation systemic chemotherapy with carboplatin for AUC of 5 and paclitaxel 175 MG/M2 every 3 weeks. First cycle 06/10/2015. Status post 3 cycles.  CURRENT THERAPY: Observation.  INTERVAL HISTORY: Colton Nguyen 73 y.o. male has a telephone virtual visit today for evaluation and discussion of his scan results.  The patient is feeling fine today with no concerning complaints except for the baseline shortness of breath.  He denied having any chest pain, cough or hemoptysis.  He denied having any recent weight loss or night sweats.  He has no nausea, vomiting, diarrhea or constipation.  He has no headache or visual changes.  The patient had repeat CT scan of the chest performed recently and he is having the visit for evaluation and discussion of his scan results.  MEDICAL HISTORY: Past Medical History:  Diagnosis Date  . Anxiety     situational  . BPH (benign prostatic hyperplasia)   . CAD (coronary artery disease)   . CHF (congestive heart failure) ([facility])   . Chronic cardiopulmonary disease ([facility])   . COPD (chronic obstructive pulmonary disease) ([facility])   . Hyperlipidemia   . Hypertension   . Hypertension 12/28/2016  . lung ca dx'd 03/2015  . Mass of lung   . Nicotine dependence   . Personal history of kidney stones   . Restless leg   . Shortness of breath dyspnea    with exertion    ALLERGIES:  has No Known Allergies.  MEDICATIONS:  Current Outpatient Medications  Medication Sig Dispense Refill  . amLODipine (NORVASC) 10 MG tablet     . aspirin 81 MG tablet Take 81 mg by mouth daily.    . cetirizine (ZYRTEC) 10 MG tablet Take 10 mg by mouth daily as needed for allergies.    . Cholecalciferol (VITAMIN D3) 5000 UNITS TABS Take 5,000 Units by mouth daily.    . cyclobenzaprine (FLEXERIL) 5 MG tablet Take 5-10 mg by mouth daily as needed for muscle spasms.    . finasteride (PROSCAR) 5 MG tablet Take 1 tablet (5 mg total) by mouth daily. 30 tablet 11  . fluticasone (FLONASE) 50 MCG/ACT nasal spray Place 2 sprays into both nostrils daily as needed for allergies.     Marland Kitchen losartan (COZAAR) 100 MG tablet     . nicotine (NICODERM CQ - DOSED IN MG/24 HOURS) 21 mg/24hr patch Place 1 patch (21 mg total) onto the skin daily. 28 patch 0  . rosuvastatin (CRESTOR) 5 MG tablet Take 5 mg by mouth  3 (three) times a week. On Monday, Wednesday, Friday    . SYMBICORT 160-4.5 MCG/ACT inhaler Inhale 2 puffs into the lungs 2 (two) times daily.     . tamsulosin (FLOMAX) 0.4 MG CAPS capsule Take 1 capsule (0.4 mg total) by mouth daily. 30 capsule 11  . tiotropium (SPIRIVA HANDIHALER) 18 MCG inhalation capsule Place 1 capsule (18 mcg total) into inhaler and inhale daily. 30 capsule 0   No current facility-administered medications for this visit.     SURGICAL HISTORY:  Past Surgical History:  Procedure Laterality Date  . BACK SURGERY     . CAD CCTA 01/31/15    . CERVICAL FUSION    . COLONOSCOPY    . LUNG BIOPSY N/A 02/27/2015   Procedure: LUNG BIOPSY;  Surgeon: Grace Isaac, MD;  Location: [facility];  Service: Thoracic;  Laterality: N/A;  . right inguinal hernia repair at age 39    . VIDEO BRONCHOSCOPY WITH ENDOBRONCHIAL ULTRASOUND N/A 02/27/2015   Procedure: VIDEO BRONCHOSCOPY WITH ENDOBRONCHIAL ULTRASOUND;  Surgeon: Grace Isaac, MD;  Location: [facility];  Service: Thoracic;  Laterality: N/A;    REVIEW OF SYSTEMS:  A comprehensive review of systems was negative except for: Respiratory: positive for dyspnea on exertion   LABORATORY DATA: Lab Results  Component Value Date   WBC 5.6 01/02/2019   HGB 13.9 01/02/2019   HCT 43.1 01/02/2019   MCV 81.2 01/02/2019   PLT 258 01/02/2019      Chemistry      Component Value Date/Time   NA 141 01/02/2019 1323   NA 142 06/25/2017 0819   K 4.1 01/02/2019 1323   K 3.5 06/25/2017 0819   CL 104 01/02/2019 1323   CO2 26 01/02/2019 1323   CO2 27 06/25/2017 0819   BUN 10 01/02/2019 1323   BUN 8.2 06/25/2017 0819   CREATININE 1.15 01/02/2019 1323   CREATININE 1.1 06/25/2017 0819      Component Value Date/Time   CALCIUM 8.7 (L) 01/02/2019 1323   CALCIUM 8.9 06/25/2017 0819   ALKPHOS 109 01/02/2019 1323   ALKPHOS 97 06/25/2017 0819   AST 18 01/02/2019 1323   AST 17 06/25/2017 0819   ALT 13 01/02/2019 1323   ALT 13 06/25/2017 0819   BILITOT 0.4 01/02/2019 1323   BILITOT 0.50 06/25/2017 0819       RADIOGRAPHIC STUDIES: Ct Chest W Contrast  Result Date: 01/03/2019 CLINICAL DATA:  Lung cancer diagnosed in 2017, finished chemotherapy and radiation therapy 3 years ago. Restaging assessment. EXAM: CT CHEST WITH CONTRAST TECHNIQUE: Multidetector CT imaging of the chest was performed during intravenous contrast administration. CONTRAST:  79mL OMNIPAQUE IOHEXOL 300 MG/ML  SOLN COMPARISON:  07/04/2018 FINDINGS: Cardiovascular: Coronary, aortic arch, and branch vessel  atherosclerotic vascular disease. Mediastinum/Nodes: Thyroid goiter with nodularity especially posteriorly in the left lobe common not appreciably changed. This was present but not appreciably hypermetabolic on the prior PET-CT from 02/20/2015 hence likely benign. Low-density fluid in the superior pericardial recess. This appears stable. No pathologic adenopathy in the chest is identified. Lungs/Pleura: Stable consolidation at the right lung apex and extending in the right upper lobe paramediastinal region likely due to a combination of chronic radiation fibrosis and bronchiectasis. Centrilobular emphysema. Chronically stable subpleural nodularity along the right lower lobe including a 4 mm in diameter subpleural nodule on image 100/7. Chronically stable minimal nodularity in the left upper lobe including a 4 by 3 mm left upper lobe nodule on image 48/7. Chronically stable mild subpleural nodularity  along the left lower lobe. No new or enlarging nodule identified. Upper Abdomen: Chronically stable bilateral small adrenal masses, not previously hypermetabolic cants likely benign. Musculoskeletal: Lower cervical plate and screw fixator. IMPRESSION: 1. Stable post radiation therapy findings in the right lung. No appreciable findings of active malignancy. Small bilateral pulmonary nodules are chronically stable. 2. Thyroid goiter, without prior accentuated metabolic activity, benign. 3. Bilateral adrenal nodules previously did not have accentuated metabolic activity and are considered benign. 4. Aortic Atherosclerosis (ICD10-I70.0) and Emphysema (ICD10-J43.9). Coronary atherosclerosis. Electronically Signed   By: Van Clines M.D.   On: 01/03/2019 08:47    ASSESSMENT AND PLAN:  This is a very pleasant 73 years old African-American male with a stage IIIa non-small cell lung cancer diagnosed in June 2016 status post a course of concurrent chemoradiation followed by consolidation chemotherapy with carboplatin and  paclitaxel. The patient has been on observation since that time and he is feeling fine. He had repeat CT scan of the chest performed recently.  I personally and independently reviewed the scans and discussed the results with the patient today. His a scan showed no concerning findings for disease recurrence or progression. I recommended for the patient to continue on observation with repeat CT scan of the chest in 6 months. He was advised to call immediately if he has any concerning symptoms in the interval. I discussed the assessment and treatment plan with the patient. The patient was provided an opportunity to ask questions and all were answered. The patient agreed with the plan and demonstrated an understanding of the instructions.   The patient was advised to call back or seek an in-person evaluation if the symptoms worsen or if the condition fails to improve as anticipated.  I provided 11 minutes of non face-to-face telephone visit time during this encounter, and > 50% was spent counseling as documented under my assessment & plan.  Eilleen Kempf, MD 01/04/2019 8:46 AM  Disclaimer: This note was dictated with voice recognition software. Similar sounding words can inadvertently be transcribed and may not be corrected upon review.

## 2019-07-07 ENCOUNTER — Inpatient Hospital Stay: Payer: Medicare Other | Attending: Internal Medicine

## 2019-07-07 ENCOUNTER — Other Ambulatory Visit: Payer: Self-pay

## 2019-07-07 ENCOUNTER — Ambulatory Visit (HOSPITAL_COMMUNITY)
Admission: RE | Admit: 2019-07-07 | Discharge: 2019-07-07 | Disposition: A | Discharge status: Home / Self Care | Payer: Medicare Other | Source: Ambulatory Visit | Attending: Internal Medicine | Admitting: Internal Medicine

## 2019-07-07 DIAGNOSIS — C3490 Malignant neoplasm of unspecified part of unspecified bronchus or lung: Secondary | ICD-10-CM

## 2019-07-07 DIAGNOSIS — Z85118 Personal history of other malignant neoplasm of bronchus and lung: Secondary | ICD-10-CM | POA: Insufficient documentation

## 2019-07-07 LAB — CMP (CANCER CENTER ONLY)
ALT: 13 U/L (ref 0–44)
AST: 19 U/L (ref 15–41)
Albumin: 3.5 g/dL (ref 3.5–5.0)
Alkaline Phosphatase: 92 U/L (ref 38–126)
Anion gap: 13 (ref 5–15)
BUN: 19 mg/dL (ref 8–23)
CO2: 25 mmol/L (ref 22–32)
Calcium: 9 mg/dL (ref 8.9–10.3)
Chloride: 101 mmol/L (ref 98–111)
Creatinine: 1.31 mg/dL — ABNORMAL HIGH (ref 0.61–1.24)
GFR, Est AFR Am: >60 mL/min (ref >60)
GFR, Estimated: 54 mL/min — ABNORMAL LOW (ref >60)
Glucose, Bld: 97 mg/dL (ref 70–99)
Potassium: 3.8 mmol/L (ref 3.5–5.1)
Sodium: 139 mmol/L (ref 135–145)
Total Bilirubin: 0.2 mg/dL — ABNORMAL LOW (ref 0.3–1.2)
Total Protein: 6.8 g/dL (ref 6.5–8.1)

## 2019-07-07 LAB — CBC WITH DIFFERENTIAL (CANCER CENTER ONLY)
Abs Immature Granulocytes: 0.01 10*3/uL (ref 0.00–0.07)
Basophils Absolute: 0 10*3/uL (ref 0.0–0.1)
Basophils Relative: 1 %
Eosinophils Absolute: 0.2 10*3/uL (ref 0.0–0.5)
Eosinophils Relative: 3 %
HCT: 37.9 % — ABNORMAL LOW (ref 39.0–52.0)
Hemoglobin: 12.5 g/dL — ABNORMAL LOW (ref 13.0–17.0)
Immature Granulocytes: 0 %
Lymphocytes Relative: 23 %
Lymphs Abs: 1.6 10*3/uL (ref 0.7–4.0)
MCH: 26.5 pg (ref 26.0–34.0)
MCHC: 33 g/dL (ref 30.0–36.0)
MCV: 80.5 fL (ref 80.0–100.0)
Monocytes Absolute: 0.6 10*3/uL (ref 0.1–1.0)
Monocytes Relative: 9 %
Neutro Abs: 4.5 10*3/uL (ref 1.7–7.7)
Neutrophils Relative %: 64 %
Platelet Count: 268 10*3/uL (ref 150–400)
RBC: 4.71 MIL/uL (ref 4.22–5.81)
RDW: 14.8 % (ref 11.5–15.5)
WBC Count: 6.9 10*3/uL (ref 4.0–10.5)
nRBC: 0 % (ref 0.0–0.2)

## 2019-07-07 MED ORDER — IOHEXOL 300 MG/ML  SOLN
75.0000 mL | Freq: Once | INTRAMUSCULAR | Status: AC | PRN
  Administered 2019-07-07: 75 mL via INTRAVENOUS

## 2019-07-07 MED ORDER — SODIUM CHLORIDE (PF) 0.9 % IJ SOLN
INTRAMUSCULAR | Status: AC
  Filled 2019-07-07: qty 50

## 2019-07-10 ENCOUNTER — Telehealth: Payer: Self-pay | Admitting: Internal Medicine

## 2019-07-10 ENCOUNTER — Encounter: Payer: Self-pay | Admitting: Internal Medicine

## 2019-07-10 ENCOUNTER — Inpatient Hospital Stay: Payer: Medicare Other | Attending: Internal Medicine | Admitting: Internal Medicine

## 2019-07-10 ENCOUNTER — Other Ambulatory Visit: Payer: Self-pay

## 2019-07-10 VITALS — BP 129/73 | HR 81 | Temp 98.3°F | Resp 17 | Ht 68.0 in | Wt 204.9 lb

## 2019-07-10 DIAGNOSIS — C3490 Malignant neoplasm of unspecified part of unspecified bronchus or lung: Secondary | ICD-10-CM | POA: Insufficient documentation

## 2019-07-10 DIAGNOSIS — Z79899 Other long term (current) drug therapy: Secondary | ICD-10-CM | POA: Insufficient documentation

## 2019-07-10 DIAGNOSIS — I110 Hypertensive heart disease with heart failure: Secondary | ICD-10-CM | POA: Insufficient documentation

## 2019-07-10 DIAGNOSIS — Z720 Tobacco use: Secondary | ICD-10-CM | POA: Diagnosis not present

## 2019-07-10 DIAGNOSIS — C3411 Malignant neoplasm of upper lobe, right bronchus or lung: Secondary | ICD-10-CM

## 2019-07-10 DIAGNOSIS — E042 Nontoxic multinodular goiter: Secondary | ICD-10-CM | POA: Diagnosis not present

## 2019-07-10 NOTE — Telephone Encounter (Signed)
Scheduled per 11/02 los, patient received after visit summary and calender.

## 2019-07-10 NOTE — Progress Notes (Signed)
[facility] [phone]   Fax:(336) [phone]  OFFICE PROGRESS NOTE  [name], MD [place] [address]  DIAGNOSIS: Stage IIIA (T3, N2, M0) non-small cell lung cancer, squamous cell carcinoma diagnosed in June 2016.  PRIOR THERAPY:  1) Concurrent chemoradiation with weekly carboplatin for an AUC of 2 and paclitaxel 45 mg/m2 with partial response.  2) Consolidation systemic chemotherapy with carboplatin for AUC of 5 and paclitaxel 175 MG/M2 every 3 weeks. First cycle 06/10/2015. Status post 3 cycles.  CURRENT THERAPY: Observation.  INTERVAL HISTORY: Colton Nguyen 72 y.o. male returns to the clinic today for follow-up visit.  The patient is feeling fine today with no concerning complaints except for shortness of breath with exertion.  He denied having any chest pain, cough or hemoptysis.  He denied having any fever or chills.  He has no nausea, vomiting, diarrhea or constipation.  He has no headache or visual changes.  Unfortunately continues to smoke and he was advised for smoke cessation.  The patient had repeat CT scan of the chest performed recently and he is here for evaluation and discussion of his scan results.  MEDICAL HISTORY: Past Medical History:  Diagnosis Date  . Anxiety    situational  . BPH (benign prostatic hyperplasia)   . CAD (coronary artery disease)   . CHF (congestive heart failure) ([facility])   . Chronic cardiopulmonary disease ([facility])   . COPD (chronic obstructive pulmonary disease) ([facility])   . Hyperlipidemia   . Hypertension   . Hypertension 12/28/2016  . lung ca dx'd 03/2015  . Mass of lung   . Nicotine dependence   . Personal history of kidney stones   . Restless leg   . Shortness of breath dyspnea    with exertion    ALLERGIES:  has No Known Allergies.  MEDICATIONS:  Current Outpatient Medications  Medication Sig Dispense Refill  . amLODipine (NORVASC) 10 MG tablet     . aspirin 81 MG tablet Take  81 mg by mouth daily.    . cetirizine (ZYRTEC) 10 MG tablet Take 10 mg by mouth daily as needed for allergies.    . Cholecalciferol (VITAMIN D3) 5000 UNITS TABS Take 5,000 Units by mouth daily.    . cyclobenzaprine (FLEXERIL) 5 MG tablet Take 5-10 mg by mouth daily as needed for muscle spasms.    . finasteride (PROSCAR) 5 MG tablet Take 1 tablet (5 mg total) by mouth daily. 30 tablet 11  . fluticasone (FLONASE) 50 MCG/ACT nasal spray Place 2 sprays into both nostrils daily as needed for allergies.     Marland Kitchen losartan (COZAAR) 100 MG tablet     . nicotine (NICODERM CQ - DOSED IN MG/24 HOURS) 21 mg/24hr patch Place 1 patch (21 mg total) onto the skin daily. 28 patch 0  . rosuvastatin (CRESTOR) 5 MG tablet Take 5 mg by mouth 3 (three) times a week. On Monday, Wednesday, Friday    . SYMBICORT 160-4.5 MCG/ACT inhaler Inhale 2 puffs into the lungs 2 (two) times daily.     . tamsulosin (FLOMAX) 0.4 MG CAPS capsule Take 1 capsule (0.4 mg total) by mouth daily. 30 capsule 11  . tiotropium (SPIRIVA HANDIHALER) 18 MCG inhalation capsule Place 1 capsule (18 mcg total) into inhaler and inhale daily. 30 capsule 0   No current facility-administered medications for this visit.     SURGICAL HISTORY:  Past Surgical History:  Procedure Laterality Date  . BACK SURGERY    .  CAD CCTA 01/31/15    . CERVICAL FUSION    . COLONOSCOPY    . LUNG BIOPSY N/A 02/27/2015   Procedure: LUNG BIOPSY;  Surgeon: Grace Isaac, MD;  Location: [facility];  Service: Thoracic;  Laterality: N/A;  . right inguinal hernia repair at age 60    . VIDEO BRONCHOSCOPY WITH ENDOBRONCHIAL ULTRASOUND N/A 02/27/2015   Procedure: VIDEO BRONCHOSCOPY WITH ENDOBRONCHIAL ULTRASOUND;  Surgeon: Grace Isaac, MD;  Location: [facility];  Service: Thoracic;  Laterality: N/A;    REVIEW OF SYSTEMS:  A comprehensive review of systems was negative except for: Respiratory: positive for dyspnea on exertion   PHYSICAL EXAMINATION: General appearance: alert,  cooperative and no distress Head: Normocephalic, without obvious abnormality, atraumatic Neck: no adenopathy, no JVD, supple, symmetrical, trachea midline and thyroid not enlarged, symmetric, no tenderness/mass/nodules Lymph nodes: Cervical, supraclavicular, and axillary nodes normal. Resp: clear to auscultation bilaterally Back: symmetric, no curvature. ROM normal. No CVA tenderness. Cardio: regular rate and rhythm, S1, S2 normal, no murmur, click, rub or gallop GI: soft, non-tender; bowel sounds normal; no masses,  no organomegaly Extremities: extremities normal, atraumatic, no cyanosis or edema  ECOG PERFORMANCE STATUS: 1 - Symptomatic but completely ambulatory  Blood pressure 129/73, pulse 81, temperature 98.3 F (36.8 C), temperature source Temporal, resp. rate 17, height 5\' 8"  (1.727 m), weight 204 lb 14.4 oz (92.9 kg), SpO2 97 %.  LABORATORY DATA: Lab Results  Component Value Date   WBC 6.9 07/07/2019   HGB 12.5 (L) 07/07/2019   HCT 37.9 (L) 07/07/2019   MCV 80.5 07/07/2019   PLT 268 07/07/2019      Chemistry      Component Value Date/Time   NA 139 07/07/2019 0929   NA 142 06/25/2017 0819   K 3.8 07/07/2019 0929   K 3.5 06/25/2017 0819   CL 101 07/07/2019 0929   CO2 25 07/07/2019 0929   CO2 27 06/25/2017 0819   BUN 19 07/07/2019 0929   BUN 8.2 06/25/2017 0819   CREATININE 1.31 (H) 07/07/2019 0929   CREATININE 1.1 06/25/2017 0819      Component Value Date/Time   CALCIUM 9.0 07/07/2019 0929   CALCIUM 8.9 06/25/2017 0819   ALKPHOS 92 07/07/2019 0929   ALKPHOS 97 06/25/2017 0819   AST 19 07/07/2019 0929   AST 17 06/25/2017 0819   ALT 13 07/07/2019 0929   ALT 13 06/25/2017 0819   BILITOT 0.2 (L) 07/07/2019 0929   BILITOT 0.50 06/25/2017 0819       RADIOGRAPHIC STUDIES: Ct Chest W Contrast  Result Date: 07/07/2019 CLINICAL DATA:  Lung cancer staging EXAM: CT CHEST WITH CONTRAST TECHNIQUE: Multidetector CT imaging of the chest was performed during  intravenous contrast administration. CONTRAST:  52mL OMNIPAQUE IOHEXOL 300 MG/ML  SOLN COMPARISON:  01/02/2019, 07/04/2018, PET-CT, 02/20/2015 FINDINGS: Cardiovascular: Aortic atherosclerosis. Normal heart size. Extensive 3 vessel coronary artery calcifications. No pericardial effusion. Mediastinum/Nodes: No enlarged mediastinal, hilar, or axillary lymph nodes. Heterogeneous, multinodular thyroid goiter unchanged compared to prior examinations. Trachea, and esophagus demonstrate no significant findings. Lungs/Pleura: Unchanged bandlike paramedian fibrotic scarring and volume loss of the suprahilar right upper lobe. Stable small pulmonary nodules, for example adjacent 3 mm nodules of the dependent right lower lobe (series 7, image 84). Moderate centrilobular emphysema. No pleural effusion or pneumothorax. Upper Abdomen: No acute abnormality. Stable bilateral soft tissue attenuation adrenal nodules, not FDG avid on initial staging examination and likely benign adenomata. Musculoskeletal: No chest wall mass or suspicious bone lesions identified. IMPRESSION:  1. Unchanged post treatment appearance of suprahilar right upper lobe malignancy with paramedian fibrotic scarring and volume loss. 2.  Stable small pulmonary nodules.  Attention on follow-up. 3. Stable bilateral soft tissue attenuation adrenal nodules, not FDG avid on initial staging examination and likely benign adenomata. 4.  Emphysema (ICD10-J43.9). 5.  Coronary artery disease.  Aortic Atherosclerosis (ICD10-I70.0). 6.  Multinodular thyroid goiter. Electronically Signed   By: Eddie Candle M.D.   On: 07/07/2019 13:09    ASSESSMENT AND PLAN:  This is a very pleasant 73 years old African-American male with a stage IIIA non-small cell lung cancer status post a course of concurrent chemoradiation followed by consolidation chemotherapy with carboplatin and paclitaxel. He is currently on observation and the patient is feeling fine. He had repeat CT scan of the  chest performed recently.  I personally and independently reviewed the scans and discussed the results with the patient today. Has a scan showed no concerning findings for disease progression. I recommended for the patient to continue on observation with repeat CT scan of the chest in 1 year. The patient was advised to call immediately if he has any concerning symptoms in the interval. The patient voices understanding of current disease status and treatment options and is in agreement with the current care plan. All questions were answered. The patient knows to call the clinic with any problems, questions or concerns. We can certainly see the patient much sooner if necessary.  Disclaimer: This note was dictated with voice recognition software. Similar sounding words can inadvertently be transcribed and may not be corrected upon review.

## 2019-08-07 ENCOUNTER — Ambulatory Visit: Payer: Medicare Other | Admitting: Urology

## 2019-08-14 ENCOUNTER — Other Ambulatory Visit: Payer: Self-pay

## 2019-08-14 ENCOUNTER — Ambulatory Visit (INDEPENDENT_AMBULATORY_CARE_PROVIDER_SITE_OTHER): Payer: Medicare Other | Admitting: Urology

## 2019-08-14 ENCOUNTER — Encounter: Payer: Self-pay | Admitting: Urology

## 2019-08-14 VITALS — BP 108/65 | HR 91 | Ht 72.0 in | Wt 204.0 lb

## 2019-08-14 DIAGNOSIS — N400 Enlarged prostate without lower urinary tract symptoms: Secondary | ICD-10-CM | POA: Diagnosis not present

## 2019-08-14 DIAGNOSIS — R351 Nocturia: Secondary | ICD-10-CM

## 2019-08-14 MED ORDER — FINASTERIDE 5 MG PO TABS: 5.0000 mg | ORAL_TABLET | Freq: Every day | ORAL | 11 refills | Status: DC

## 2019-08-14 MED ORDER — TAMSULOSIN HCL 0.4 MG PO CAPS: 0.4000 mg | ORAL_CAPSULE | Freq: Every day | ORAL | 11 refills | Status: DC

## 2019-08-14 NOTE — Progress Notes (Signed)
   08/14/2019 3:40 PM   Colton Nguyen 25-Dec-1945 [id number]  Reason for visit: Follow up BPH  HPI: I saw [name] back in urology clinic for follow-up of BPH and urinary symptoms.  He is on maximal medical therapy with Flomax and finasteride.  He feels his urinary symptoms are stable, and he really has no complaints today.  He has nocturia 3-4 times per night, but aside from that is minimally bothered.  He has a number of comorbidities including stage IIIa non-small cell lung cancer treated with chemo and radiation, CAD, congestive heart failure, and oxygen use at home.  He has a history of elevated PSAs in the past that were downtrending and relatively stable, and on our last visit we had elected to discontinue PSA screening.  With his age and numerous co-morbidities this is very reasonable. He previously refused DRE.   -We discussed behavioral strategies regarding nocturia including minimizing fluids in the evening, avoiding sodas, diet drinks, tea, and alcohol, and double voiding prior to bed. -Flomax and finasteride refilled -He would like to have his BPH meds refilled yearly by his PCP which is very reasonable, we are happy to see him back in the future if he has worsening urinary symptoms  A total of 25 minutes were spent face-to-face with the patient, greater than 50% was spent in patient education, counseling, and coordination of care regarding PSA screening, BPH, nocturia.  Billey Co, [facility] [address] [place], [place] [address] [phone]

## 2019-09-09 ENCOUNTER — Encounter: Payer: Self-pay | Admitting: Emergency Medicine

## 2019-09-09 ENCOUNTER — Emergency Department
Admission: EM | Admit: 2019-09-09 | Discharge: 2019-09-09 | Disposition: A | Discharge status: Home / Self Care | Payer: Medicare Other | Attending: Emergency Medicine | Admitting: Emergency Medicine

## 2019-09-09 ENCOUNTER — Emergency Department: Payer: Medicare Other

## 2019-09-09 DIAGNOSIS — I110 Hypertensive heart disease with heart failure: Secondary | ICD-10-CM | POA: Insufficient documentation

## 2019-09-09 DIAGNOSIS — I509 Heart failure, unspecified: Secondary | ICD-10-CM | POA: Insufficient documentation

## 2019-09-09 DIAGNOSIS — G8929 Other chronic pain: Secondary | ICD-10-CM | POA: Diagnosis not present

## 2019-09-09 DIAGNOSIS — F17210 Nicotine dependence, cigarettes, uncomplicated: Secondary | ICD-10-CM | POA: Insufficient documentation

## 2019-09-09 DIAGNOSIS — J449 Chronic obstructive pulmonary disease, unspecified: Secondary | ICD-10-CM | POA: Insufficient documentation

## 2019-09-09 DIAGNOSIS — M545 Low back pain: Secondary | ICD-10-CM | POA: Diagnosis present

## 2019-09-09 DIAGNOSIS — I2510 Atherosclerotic heart disease of native coronary artery without angina pectoris: Secondary | ICD-10-CM | POA: Insufficient documentation

## 2019-09-09 DIAGNOSIS — M5442 Lumbago with sciatica, left side: Secondary | ICD-10-CM | POA: Diagnosis not present

## 2019-09-09 DIAGNOSIS — Z7982 Long term (current) use of aspirin: Secondary | ICD-10-CM | POA: Insufficient documentation

## 2019-09-09 DIAGNOSIS — Z79899 Other long term (current) drug therapy: Secondary | ICD-10-CM | POA: Insufficient documentation

## 2019-09-09 MED ORDER — METHYLPREDNISOLONE SODIUM SUCC 125 MG IJ SOLR
125.0000 mg | Freq: Once | INTRAMUSCULAR | Status: AC
  Administered 2019-09-09: 15:00:00 125 mg via INTRAMUSCULAR
  Filled 2019-09-09: qty 2

## 2019-09-09 MED ORDER — TRAMADOL HCL 50 MG PO TABS: 50.0000 mg | ORAL_TABLET | Freq: Two times a day (BID) | ORAL | 0 refills | Status: AC | PRN

## 2019-09-09 MED ORDER — HYDROMORPHONE HCL 1 MG/ML IJ SOLN
1.0000 mg | Freq: Once | INTRAMUSCULAR | Status: AC
  Administered 2019-09-09: 15:00:00 1 mg via INTRAMUSCULAR
  Filled 2019-09-09: qty 1

## 2019-09-09 MED ORDER — LIDOCAINE 5 % EX PTCH: 1.0000 | MEDICATED_PATCH | Freq: Two times a day (BID) | CUTANEOUS | 0 refills | Status: DC

## 2019-09-09 MED ORDER — ORPHENADRINE CITRATE 30 MG/ML IJ SOLN
60.0000 mg | Freq: Two times a day (BID) | INTRAMUSCULAR | Status: DC
  Administered 2019-09-09: 60 mg via INTRAMUSCULAR
  Filled 2019-09-09: qty 2

## 2019-09-09 MED ORDER — TRAMADOL HCL 50 MG PO TABS: 50.0000 mg | ORAL_TABLET | Freq: Once | ORAL | Status: DC

## 2019-09-09 MED ORDER — LIDOCAINE 5 % EX PTCH
1.0000 | MEDICATED_PATCH | CUTANEOUS | Status: DC
  Administered 2019-09-09: 1 via TRANSDERMAL
  Filled 2019-09-09: qty 1

## 2019-09-09 NOTE — ED Provider Notes (Signed)
[facility] Emergency Department Provider Note   ____________________________________________   First MD Initiated Contact with Patient 09/09/19 1310     (approximate)  I have reviewed the triage vital signs and the nursing notes.   HISTORY  Chief Complaint Back Pain    HPI Colton Nguyen is a 74 y.o. male patient complain of radicular pain to the left lower extremity for 3 days.  Patient denies bladder or bowel dysfunction.  Patient rates the pain as a 10/10.  Patient described the pain as "achy".  No provocative incident for complaint.  No palliative measure for complaint.  History of lumbar surgery for spinal stenosis.         Past Medical History:  Diagnosis Date  . Anxiety    situational  . BPH (benign prostatic hyperplasia)   . CAD (coronary artery disease)   . CHF (congestive heart failure) ([place])   . Chronic cardiopulmonary disease ([place])   . COPD (chronic obstructive pulmonary disease) ([place])   . Hyperlipidemia   . Hypertension   . Hypertension 12/28/2016  . lung ca dx'd 03/2015  . Mass of lung   . Nicotine dependence   . Personal history of kidney stones   . Restless leg   . Shortness of breath dyspnea    with exertion    Patient Active Problem List   Diagnosis Date Noted  . BPH (benign prostatic hyperplasia) 08/01/2018  . COPD (chronic obstructive pulmonary disease) ([place]) 02/06/2018  . Hypertension 12/28/2016  . Exacerbation of Emphysema/COPD ([place]) 12/28/2016  . Encounter for antineoplastic chemotherapy 06/03/2015  . Smoking trying to quit 04/01/2015  . Squamous cell carcinoma of lung, stage III 03/07/2015  . spiculated right upper lobe mass 02/14/2015    Past Surgical History:  Procedure Laterality Date  . BACK SURGERY    . CAD CCTA 01/31/15    . CERVICAL FUSION    . COLONOSCOPY    . LUNG BIOPSY N/A 02/27/2015   Procedure: LUNG BIOPSY;  Surgeon: Grace Isaac, MD;  Location: [place] [place];  Service: Thoracic;   Laterality: N/A;  . right inguinal hernia repair at age 57    . VIDEO BRONCHOSCOPY WITH ENDOBRONCHIAL ULTRASOUND N/A 02/27/2015   Procedure: VIDEO BRONCHOSCOPY WITH ENDOBRONCHIAL ULTRASOUND;  Surgeon: Grace Isaac, MD;  Location: [facility];  Service: Thoracic;  Laterality: N/A;    Prior to Admission medications   Medication Sig Start Date End Date Taking? Authorizing Provider  amLODipine (NORVASC) 10 MG tablet  07/06/18   [provider]  aspirin 81 MG tablet Take 81 mg by mouth daily.    [provider]  cetirizine (ZYRTEC) 10 MG tablet Take 10 mg by mouth daily as needed for allergies.    [provider]  Cholecalciferol (VITAMIN D3) 5000 UNITS TABS Take 5,000 Units by mouth daily.    [provider]  cyclobenzaprine (FLEXERIL) 5 MG tablet Take 5-10 mg by mouth daily as needed for muscle spasms.    [provider]  finasteride (PROSCAR) 5 MG tablet Take 1 tablet (5 mg total) by mouth daily. 08/14/19   Billey Co, MD  fluticasone (FLONASE) 50 MCG/ACT nasal spray Place 2 sprays into both nostrils daily as needed for allergies.  04/02/15   [provider]  lidocaine (LIDODERM) 5 % Place 1 patch onto the skin every 12 (twelve) hours. Remove & Discard patch within 12 hours or as directed by MD 09/09/19 09/08/20  Sable Feil, PA-C  losartan (COZAAR) 100  MG tablet  07/19/18   [provider]  nicotine (NICODERM CQ - DOSED IN MG/24 HOURS) 21 mg/24hr patch Place 1 patch (21 mg total) onto the skin daily. 02/09/18   Alma Friendly, MD  rosuvastatin (CRESTOR) 5 MG tablet Take 5 mg by mouth 3 (three) times a week. On Monday, Wednesday, Friday 12/30/17   [provider]  SYMBICORT 160-4.5 MCG/ACT inhaler Inhale 2 puffs into the lungs 2 (two) times daily.  09/06/17   [provider]  tamsulosin (FLOMAX) 0.4 MG CAPS capsule Take 1 capsule (0.4 mg total) by mouth daily. 08/14/19   Billey Co, MD  tiotropium (SPIRIVA  HANDIHALER) 18 MCG inhalation capsule Place 1 capsule (18 mcg total) into inhaler and inhale daily. 02/08/18 03/10/18  Alma Friendly, MD  traMADol (ULTRAM) 50 MG tablet Take 1 tablet (50 mg total) by mouth every 12 (twelve) hours as needed for up to 5 days. 09/09/19 09/14/19  Sable Feil, PA-C    Allergies Patient has no known allergies.  Family History  Problem Relation Age of Onset  . Cancer Other   . Heart disease Father   . Hypertension Other     Social History Social History   Tobacco Use  . Smoking status: Current Every Day Smoker    Packs/day: 1.50    Years: 47.00    Pack years: 70.50    Types: Cigarettes    Start date: 02/14/1967  . Smokeless tobacco: Never Used  Substance Use Topics  . Alcohol use: Yes    Alcohol/week: 0.0 standard drinks    Comment: ocassional  . Drug use: No    Review of Systems  Constitutional: No fever/chills Eyes: No visual changes. ENT: No sore throat. Cardiovascular: Denies chest pain. Respiratory: Denies shortness of breath. Gastrointestinal: No abdominal pain.  No nausea, no vomiting.  No diarrhea.  No constipation. Genitourinary: Negative for dysuria. Musculoskeletal: Negative for back pain. Skin: Negative for rash. Neurological: Negative for headaches, focal weakness or numbness. Psychiatric:  Anxiety Endocrine:  Hyperlipidemia and hypertension ___________________________________________   PHYSICAL EXAM:  VITAL SIGNS: ED Triage Vitals [09/09/19 1209]  Enc Vitals Group     BP 126/65     Pulse Rate 72     Resp 20     Temp 98.3 F (36.8 C)     Temp Source Oral     SpO2 96 %     Weight 204 lb (92.5 kg)     Height 5\' 11"  (1.803 m)     Head Circumference      Peak Flow      Pain Score 10     Pain Loc      Pain Edu?      Excl. in [place]?     Constitutional: Alert and oriented.  Moderate distress.  Patient is lying on his left side when I enter the exam room. Hematological/Lymphatic/Immunilogical: No cervical  lymphadenopathy. Cardiovascular: Normal rate, regular rhythm. Grossly normal heart sounds.  Good peripheral circulation. Respiratory: Normal respiratory effort.  No retractions. Lungs CTAB. Gastrointestinal: Soft and nontender. No distention. No abdominal bruits. No CVA tenderness. Musculoskeletal: No obvious spinal deformity.  Surgical scars consistent with history.  Patient has moderate guarding palpation of L4-S1. No Lower extremity tenderness nor edema.  No joint effusions.  Patient placed in a supine position had negative straight leg test. Neurologic:  Normal speech and language. No gross focal neurologic deficits are appreciated. No gait instability. Skin:  Skin is warm, dry and intact. No  rash noted. Psychiatric: Mood and affect are normal. Speech and behavior are normal.  ____________________________________________   LABS (all labs ordered are listed, but only abnormal results are displayed)  Labs Reviewed - No data to display ____________________________________________  EKG   ____________________________________________  RADIOLOGY  ED MD interpretation:    Official radiology report(s): DG Lumbar Spine 2-3 Views  Result Date: 09/09/2019 CLINICAL DATA:  Acute on chronic low back pain with left-sided radiculopathy. EXAM: LUMBAR SPINE - 2-3 VIEW COMPARISON:  December 28, 2015. FINDINGS: No fracture or spondylolisthesis is noted. Mild degenerative disc disease is noted at L2-3, L3-4 and L4-5. Moderate degenerative disc disease is noted at L5-S1. Atherosclerosis of abdominal aorta is noted. IMPRESSION: Multilevel degenerative disc disease. Aortic atherosclerosis. No acute abnormality seen in the lumbar spine. Electronically Signed   By: Marijo Conception M.D.   On: 09/09/2019 15:47    ____________________________________________   PROCEDURES  Procedure(s) performed (including Critical Care):  Procedures   ____________________________________________   INITIAL IMPRESSION /  ASSESSMENT AND PLAN / ED COURSE  As part of my medical decision making, I reviewed the following data within the [place]     Patient presents with back pain with radicular component to the left lower extremity.  Discussed x-ray findings with patient showed moderate degenerative changes in the lower lumbar spine.  Physical exam consistent with low back pain secondary to degenerative changes.  No acute findings on x-ray.  Patient given discharge care instruction advised take medication as directed.  Patient advised to follow-up with PCP for continued care.    Colton Nguyen was evaluated in Emergency Department on 09/09/2019 for the symptoms described in the history of present illness. He was evaluated in the context of the global COVID-19 pandemic, which necessitated consideration that the patient might be at risk for infection with the SARS-CoV-2 virus that causes COVID-19. Institutional protocols and algorithms that pertain to the evaluation of patients at risk for COVID-19 are in a state of rapid change based on information released by regulatory bodies including the CDC and federal and state organizations. These policies and algorithms were followed during the patient's care in the ED.       ____________________________________________   FINAL CLINICAL IMPRESSION(S) / ED DIAGNOSES  Final diagnoses:  Chronic bilateral low back pain with left-sided sciatica     ED Discharge Orders         Ordered    lidocaine (LIDODERM) 5 %  Every 12 hours     09/09/19 1558    traMADol (ULTRAM) 50 MG tablet  Every 12 hours PRN     09/09/19 1558           Note:  This document was prepared using Dragon voice recognition software and may include unintentional dictation errors.    Sable Feil, PA-C 09/09/19 1600    Earleen Newport, MD 09/09/19 [phone]

## 2019-09-09 NOTE — Discharge Instructions (Addendum)
Follow discharge care instruction take medication as directed. °

## 2019-09-09 NOTE — ED Triage Notes (Signed)
Low back pain radiating to L leg x 3 days

## 2019-09-21 ENCOUNTER — Other Ambulatory Visit: Payer: Self-pay | Admitting: Neurosurgery

## 2019-09-21 DIAGNOSIS — M5442 Lumbago with sciatica, left side: Secondary | ICD-10-CM

## 2019-10-18 ENCOUNTER — Ambulatory Visit
Admission: RE | Admit: 2019-10-18 | Discharge: 2019-10-18 | Disposition: A | Discharge status: Home / Self Care | Payer: Medicare Other | Source: Ambulatory Visit | Attending: Neurosurgery | Admitting: Neurosurgery

## 2019-10-18 DIAGNOSIS — M5442 Lumbago with sciatica, left side: Secondary | ICD-10-CM

## 2019-10-30 ENCOUNTER — Other Ambulatory Visit (HOSPITAL_COMMUNITY): Payer: Self-pay | Admitting: Neurosurgery

## 2019-10-30 ENCOUNTER — Other Ambulatory Visit: Payer: Self-pay | Admitting: Neurosurgery

## 2019-10-30 DIAGNOSIS — M5442 Lumbago with sciatica, left side: Secondary | ICD-10-CM

## 2019-11-08 ENCOUNTER — Ambulatory Visit (HOSPITAL_COMMUNITY)
Admission: RE | Admit: 2019-11-08 | Discharge: 2019-11-08 | Disposition: A | Discharge status: Home / Self Care | Payer: Medicare Other | Source: Ambulatory Visit | Attending: Neurosurgery | Admitting: Neurosurgery

## 2019-11-08 ENCOUNTER — Other Ambulatory Visit: Payer: Self-pay

## 2019-11-08 DIAGNOSIS — M5442 Lumbago with sciatica, left side: Secondary | ICD-10-CM | POA: Diagnosis present

## 2019-11-08 MED ORDER — ONDANSETRON HCL 4 MG/2ML IJ SOLN: 4.0000 mg | Freq: Four times a day (QID) | INTRAMUSCULAR | Status: DC | PRN

## 2019-11-08 MED ORDER — OXYCODONE HCL 5 MG PO TABS
5.0000 mg | ORAL_TABLET | ORAL | Status: DC | PRN
  Administered 2019-11-08 (×2): 5 mg via ORAL
  Filled 2019-11-08: qty 1

## 2019-11-08 MED ORDER — IOHEXOL 180 MG/ML  SOLN
20.0000 mL | Freq: Once | INTRAMUSCULAR | Status: AC | PRN
  Administered 2019-11-08: 10 mL via INTRATHECAL

## 2019-11-08 MED ORDER — DIAZEPAM 5 MG PO TABS: 10.0000 mg | ORAL_TABLET | Freq: Once | ORAL | Status: AC

## 2019-11-08 MED ORDER — OXYCODONE HCL 5 MG PO TABS
ORAL_TABLET | ORAL | Status: AC
  Filled 2019-11-08: qty 1

## 2019-11-08 MED ORDER — DIAZEPAM 5 MG PO TABS
ORAL_TABLET | ORAL | Status: AC
  Administered 2019-11-08: 10 mg via ORAL
  Filled 2019-11-08: qty 2

## 2019-11-08 NOTE — Op Note (Signed)
11/08/2019 lumbar Myelogram  PATIENT:  Colton Nguyen is a 74 y.o. male  PRE-OPERATIVE DIAGNOSIS:  Lumbar stenosis with neurogenic claudication  POST-OPERATIVE DIAGNOSIS:  same  PROCEDURE:  Lumbar Myelogram  SURGEON:  Rheannon Cerney  ANESTHESIA:   local LOCAL MEDICATIONS USED:  LIDOCAINE  Procedure Note: Akeen Ledyard is a 74 y.o. male Was taken to the fluoroscopy suite and  positioned prone on the fluoroscopy table. His back was prepared and draped in a sterile manner. I infiltrated 4 cc into the lumbar region. I then introduced a spinal needle into the thecal sac at the Lr3/4 interlaminar space. I infiltrated 20cc of Isovue 180 into the thecal sac. Fluoroscopy showed the needle and contrast in the thecal sac. Sheryl Towell Padin tolerated the procedure well. he Will be taken to CT for evaluation.     PATIENT DISPOSITION:  Short Stay

## 2019-11-08 NOTE — Discharge Instructions (Signed)
Myelogram  A myelogram is an imaging study of the spinal cord and the places where nerves attach to the spinal cord (nerve roots). A dye (contrast material) is injected into the spine before the X-ray. This provides a clearer image for your health care provider to see. You may need this study done if you have a spinal cord problem that cannot be diagnosed with other imaging studies, such as a CT scan or an MRI. You may also have this study to check your spine after surgery. Tell a health care provider about:  Any allergies you have, especially to iodine.  All medicines you are taking, including vitamins, herbs, eye drops, creams, and over-the-counter medicines.  Any problems you or family members have had with anesthetic medicines or contrast material.  Any blood disorders you have.  Any surgeries you have had.  Any medical conditions you have or have had, including asthma.  Whether you are pregnant or may be pregnant. What are the risks? Generally, this is a safe procedure. However, problems may occur, including:  Infection.  Bleeding.  Allergic reaction to medicines or dyes.  Damage to your spinal cord or nerves.  Loss or leaking of spinal fluid. This can lead to headaches.  Damage to kidneys.  Seizures. This is rare. What happens before the procedure?  Follow instructions from your health care provider about eating or drinking restrictions. You may be asked to drink more fluids.  Ask your health care provider about changing or stopping your regular medicines. This is especially important if you are taking diabetes medicines or blood thinners.  Plan to have someone take you home from the hospital or clinic.  If you will be going home right after the procedure, plan to have someone with you for 24 hours. What happens during the procedure?  You will lie face down on a table.  Your health care provider will locate the best injection site on your spine. This is most  often in the lower back.  The area of injection will be washed with soap.  You will be given a medicine to numb the area (local anesthetic).  Your health care provider will insert a long needle into the space around your spinal cord (subarachnoid space).  A sample of spinal fluid may be taken and sent to the lab for testing.  The contrast material will be injected into the subarachnoid space.  The exam table may be tilted to help the contrast material flow up or down your spine.  The X-ray will take images of your spinal cord for your health care provider to examine.  A bandage (dressing) may be placed over the injection site. The procedure may vary among health care providers and hospitals. What can I expect after this procedure?  Your blood pressure, heart rate, breathing rate, and blood oxygen level may be monitored until you leave the hospital or clinic.  You may have: ? Soreness on your injection site. ? A mild headache.  You will be asked to lie down with your head raised (elevated). This reduces the risk of a headache.  It is up to you to get the results of your procedure. Ask your health care provider, or the department that is doing the procedure, when your results will be ready. Follow these instructions at home:   Rest as told by your health care provider. Lie flat with your head slightly elevated to reduce the risk of a headache.  Do not bend, lift, or do hard work  for 24-48 hours, or as told by your health care provider.  Take over-the-counter and prescription medicines only as told by your health care provider.  Take care of your dressing as told by your health care provider.  Drink enough fluid to keep your urine pale yellow.  Bathe or shower as told by your health care provider. Contact a health care provider if:  You have a fever.  You have a headache that lasts longer than 24 hours.  You feel nauseous or vomit.  You have a stiff neck or numbness in  your legs.  You are unable to urinate or have a bowel movement.  You develop a rash, itching, or sneezing. Get help right away if:  You have new symptoms or your symptoms get worse.  You have a seizure.  You have trouble breathing. Summary  A myelogram is an imaging study of the spinal cord and the places where nerves attach to the spinal cord (nerve roots).  Before the procedure, follow instructions from your health care provider about changing or stopping your regular medicines, and eating and drinking restrictions.  After this procedure, you will be asked to lie down with your head raised (elevated). This reduces your risk of a headache.  Do not bend, lift, or do hard work for 24-48 hours, or as told by your health care provider.  Contact a health care provider if you have a stiff neck or numbness in your legs. Get help right away if symptoms get worse, or you have a seizure or trouble breathing. This information is not intended to replace advice given to you by your health care provider. Make sure you discuss any questions you have with your health care provider. Document Revised: 11/02/2018 Document Reviewed: 11/03/2018 Elsevier Patient Education  [place].

## 2019-11-09 ENCOUNTER — Other Ambulatory Visit: Payer: Self-pay | Admitting: Neurosurgery

## 2019-11-11 ENCOUNTER — Other Ambulatory Visit (HOSPITAL_COMMUNITY)
Admission: RE | Admit: 2019-11-11 | Discharge: 2019-11-11 | Disposition: A | Discharge status: Home / Self Care | Payer: Medicare Other | Source: Ambulatory Visit | Attending: Obstetrics and Gynecology | Admitting: Obstetrics and Gynecology

## 2019-11-11 ENCOUNTER — Other Ambulatory Visit: Payer: Self-pay

## 2019-11-11 LAB — SARS CORONAVIRUS 2 (TAT 6-24 HRS): SARS Coronavirus 2: NEGATIVE

## 2019-11-13 ENCOUNTER — Encounter (HOSPITAL_COMMUNITY): Payer: Self-pay | Admitting: Neurosurgery

## 2019-11-13 ENCOUNTER — Other Ambulatory Visit: Payer: Self-pay

## 2019-11-13 NOTE — Progress Notes (Signed)
Pt denies any acute cardiopulmonary issues. Pt stated that he is under the care of Dr. Neoma Laming, Cardiology and Dr. Lamonte Sakai, PCP. Pt denies having a cardiac cath. Pt denies having an EKG and chest x ray in the last year. Pt denies recent labs. Pt stated that last dose of Aspirin was " a week or so ago"  as instructed by MD. Pt made aware to stop taking vitamins, fish oil and herbal medications. Do not take any NSAIDs ie: Ibuprofen, Advil, Naproxen (Aleve), Motrin, BC and Goody Powder. Pt reminded to quarantine. Pt verbalized understanding of all pre-op instructions. See PA, Anesthesiology,  note.

## 2019-11-13 NOTE — Anesthesia Preprocedure Evaluation (Addendum)
Anesthesia Evaluation  Patient identified by MRN, date of birth, ID band Patient awake    Reviewed: Allergy & Precautions, NPO status , Patient's Chart, lab work & pertinent test results  History of Anesthesia Complications Negative for: history of anesthetic complications  Airway Mallampati: II  TM Distance: >3 FB Neck ROM: Full    Dental  (+) Dental Advisory Given, Edentulous Upper, Missing   Pulmonary COPD,  COPD inhaler, Current SmokerPatient did not abstain from smoking.,  Hx of Lung Ca S/P chemoradiation   Pulmonary exam normal        Cardiovascular hypertension, Pt. on medications + CAD  Normal cardiovascular exam  Cardiac CT 08/11/18 ([facility]) 1 - Calcium score is 355.8. 2 - Right dominant system 3 - Mild luminal irregularities in LAD and LCX, with normal RCA. Advise to treat medically with aspirin and statins.  (Comparison cardiac CT 01/31/15: calcium score 125.6, 50% mid LAD which was done for equivocal stress test 01/15/15)  Nuclear stress test 08/02/18 ([facility]): Perfusion/wall motion findings: EF 75%.  Small mild partially reversible inferior wall defect, normal wall motion. Impression: Equivocal stress test with normal LVEF. - He was referred for cardiac CCT by Neoma Laming, MD  Echo 08/05/18 ([facility]): Assessment Technically difficult due to body habitus Unable to visualize cardiac chambers to measure and interrogate Normal LV systolic function, EF 38% Mild RV systolic dysfunction Normal left ventricular wall motion Mild left ventricular hypertrophy with grade 1 (relaxation abnormality) diastolic dysfunction Trace pulmonary regurgitation Mild tricuspid regurgitation Normal pulmonary artery pressure  RVSP 32.0 mmHg Mild mitral regurgitation Trivial posterior pericardial effusion without hemodynamic compromise or tamponade.    Neuro/Psych PSYCHIATRIC DISORDERS Anxiety negative neurological ROS     GI/Hepatic negative GI ROS, Neg liver ROS,   Endo/Other  negative endocrine ROS  Renal/GU negative Renal ROS     Musculoskeletal negative musculoskeletal ROS (+)   Abdominal   Peds  Hematology negative hematology ROS (+)   Anesthesia Other Findings Day of surgery medications reviewed with the patient.  Reproductive/Obstetrics                           Anesthesia Physical Anesthesia Plan  ASA: III  Anesthesia Plan: General   Post-op Pain Management:    Induction: Intravenous  PONV Risk Score and Plan: 2 and Ondansetron and Dexamethasone  Airway Management Planned: Oral ETT  Additional Equipment:   Intra-op Plan:   Post-operative Plan: Extubation in OR  Informed Consent: I have reviewed the patients History and Physical, chart, labs and discussed the procedure including the risks, benefits and alternatives for the proposed anesthesia with the patient or authorized representative who has indicated his/her understanding and acceptance.     Dental advisory given  Plan Discussed with: Anesthesiologist and CRNA  Anesthesia Plan Comments: (PAT note written 11/13/2019 by Myra Gianotti, PA-C. SAME DAY WORK-UP   )      Anesthesia Quick Evaluation

## 2019-11-13 NOTE — Progress Notes (Addendum)
Anesthesia Chart Review: SAME DAY WORK-UP   Case: [id number] Date/Time: 11/14/19 0845   Procedure: Lumbar 3-4 Lumbar 4-5 Posterior lumbar interbody (N/A )   Anesthesia type: General   Pre-op diagnosis: Lumbar stenosis with neurogenic claudication   Location: [facility] OR ROOM 19 / [facility]   Surgeons: Ashok Pall, MD      DISCUSSION: Patient is a 74 year old male scheduled for the above procedure. ED visit 09/09/19 for 10/10 back pain. Ultimately referred to neurosurgery. Dr. Christella Noa asked his staff to post case ASAP following 11/08/19 CT myelogram (progressed moderate-severe stenosis, L3-4, L4-5).  History includes smoking, HTN, HLD, CHF, CAD, COPD, exertional dyspnea, lung cancer (diagnosed non-small cell, SCC RUL stage IIIA 02/28/15, s/p chemoradiation), BPH, cervical fusion (C4-6 ACDF 08/06/10).   - Last evaluation with Lamonte Sakai, MD was on 09/15/19. He was referred to neurosurgery.  - Last cardiology evaluation with Roslyn Smiling was on 02/02/19. Instructions suggest as needed follow-up. He had minor luminal irregularities of the LAD and CX on 07/2018 coronary CTA.  11/11/19 presurgical COVID-19 test negative.  He is a same-day work-up so anesthesia team will evaluate on the day of surgery.   VS: There were no vitals taken for this visit.  Wt Readings from Last 3 Encounters:  11/08/19 92.5 kg  09/09/19 92.5 kg  08/14/19 92.5 kg   Temp Readings from Last 3 Encounters:  11/08/19 36.7 C (Oral)  09/09/19 36.8 C (Oral)  07/10/19 36.8 C (Temporal)   BP Readings from Last 3 Encounters:  11/08/19 140/88  09/09/19 126/65  08/14/19 108/65   Pulse Readings from Last 3 Encounters:  11/08/19 100  09/09/19 72  08/14/19 91    PROVIDERS: [name], MD is PCP with [facility] - Neoma Laming, MD is cardiologist with [facility]. Last evaluation 02/02/19. He wrote, "Return to the clinic if condition worsens or new symptoms arise." - Curt Bears, MD is  HEM-ONC. Last evaluation 07/10/19. Forde Radon, MD is urology. Patient preferred PCP refill BPH meds, so as needed urology follow-up recommended at 08/14/19 visit.    LABS:  He is for updated labs prior to surgery. Cr 1.15-1.13 from 07/04/18-07/07/19 [facility] Epic). Labs from 05/18/19 at [facility] showed a normal CBC and Creatinine of 1.67, normal LFTs--with follow-up Cr 1.17 on 06/01/19. A1c 5.8% 06/01/19.    IMAGES: CT L-spine with myelogram 11/08/19: IMPRESSION: - Lumbar spondylosis has progressed at multiple levels since MRI 05/09/2011. Findings are most notably as follows. - At L3-L4, multifactorial severe spinal canal has significantly progressed. There is near complete effacement of the thecal sac. Redundancy of the cauda equina nerve roots cephalad to this level. Moderate/severe bilateral neural foraminal narrowing, progressed. - At L4-L5, multifactorial moderate to moderately severe spinal canal stenosis, progressed. Moderate/severe bilateral neural foraminal narrowing, progressed. - At L2-L3, there has been interval posterior decompression. The spinal canal is now widely patent. Multifactorial moderate/severe bilateral neural foraminal narrowing, progressed. - At L5-S1, unchanged severe disc height loss. Slight interval progression of multifactorial bilateral subarticular stenosis with slight crowding of the descending left S1 nerve root. Bilateral neural foraminal narrowing has also progressed (moderate right, severe left).   EKG: He will need updated EKG on the day of surgery. Last tracing from [facility] showed NSR on 07/26/18 (> 1 year ago).   CV: Cardiac CT 08/11/18 [facility]) 1 - Calcium score is 355.8. 2 - Right dominant system 3 - Mild luminal irregularities in LAD and LCX, with normal RCA.  Advise to treat medically with aspirin and statins.  (Comparison cardiac CT 01/31/15: calcium score 125.6, 50% mid LAD which  was done for equivocal stress test 01/15/15)  Nuclear stress test 08/02/18 ([place]): Perfusion/wall motion findings: EF 75%.  Small mild partially reversible inferior wall defect, normal wall motion. Impression: Equivocal stress test with normal LVEF. - He was referred for cardiac CCT by Neoma Laming, MD  Echo 08/05/18 ([place]): Assessment Technically difficult due to body habitus Unable to visualize cardiac chambers to measure and interrogate Normal LV systolic function, EF 09% Mild RV systolic dysfunction Normal left ventricular wall motion Mild left ventricular hypertrophy with grade 1 (relaxation abnormality) diastolic dysfunction Trace pulmonary regurgitation Mild tricuspid regurgitation Normal pulmonary artery pressure  RVSP 32.0 mmHg Mild mitral regurgitation Trivial posterior pericardial effusion without hemodynamic compromise or tamponade.    Past Medical History:  Diagnosis Date  . Anxiety    situational  . BPH (benign prostatic hyperplasia)   . CAD (coronary artery disease)   . CHF (congestive heart failure) ([place])   . Chronic cardiopulmonary disease ([place])   . COPD (chronic obstructive pulmonary disease) ([place])   . Hyperlipidemia   . Hypertension   . Hypertension 12/28/2016  . lung ca dx'd 03/2015  . Mass of lung   . Nicotine dependence   . Personal history of kidney stones   . Restless leg   . Shortness of breath dyspnea    with exertion    Past Surgical History:  Procedure Laterality Date  . BACK SURGERY    . CAD CCTA 01/31/15    . CERVICAL FUSION    . COLONOSCOPY    . LUNG BIOPSY N/A 02/27/2015   Procedure: LUNG BIOPSY;  Surgeon: Grace Isaac, MD;  Location: [place];  Service: Thoracic;  Laterality: N/A;  . right inguinal hernia repair at age 89    . VIDEO BRONCHOSCOPY WITH ENDOBRONCHIAL ULTRASOUND N/A 02/27/2015   Procedure: VIDEO BRONCHOSCOPY WITH ENDOBRONCHIAL ULTRASOUND;  Surgeon: Grace Isaac, MD;   Location[facility] OR;  Service: Thoracic;  Laterality: N/A;    MEDICATIONS: No current facility-administered medications for this encounter.   Marland Kitchen acetaminophen (TYLENOL) 500 MG tablet  . albuterol (VENTOLIN HFA) 108 (90 Base) MCG/ACT inhaler  . amLODipine (NORVASC) 10 MG tablet  . aspirin 81 MG tablet  . atorvastatin (LIPITOR) 40 MG tablet  . Cholecalciferol (VITAMIN D3) 5000 UNITS TABS  . finasteride (PROSCAR) 5 MG tablet  . fluticasone (FLONASE) 50 MCG/ACT nasal spray  . HYDROcodone-acetaminophen (NORCO/VICODIN) 5-325 MG tablet  . lidocaine (LIDODERM) 5 %  . Lidocaine 4 % PTCH  . LORazepam (ATIVAN) 0.5 MG tablet  . losartan-hydrochlorothiazide (HYZAAR) 100-12.5 MG tablet  . methocarbamol (ROBAXIN) 500 MG tablet  . SYMBICORT 160-4.5 MCG/ACT inhaler  . tamsulosin (FLOMAX) 0.4 MG CAPS capsule  . tiotropium (SPIRIVA HANDIHALER) 18 MCG inhalation capsule  . Tiotropium Bromide Monohydrate (SPIRIVA RESPIMAT) 2.5 MCG/ACT AERS     Myra Gianotti, PA-C Surgical Short Stay/Anesthesiology [facility] Phone [phone] [facility] Phone [phone] 11/13/2019 3:47 PM

## 2019-11-14 ENCOUNTER — Other Ambulatory Visit: Payer: Self-pay

## 2019-11-14 ENCOUNTER — Inpatient Hospital Stay (HOSPITAL_COMMUNITY)
Admission: RE | Admit: 2019-11-14 | Discharge: 2019-11-18 | DRG: 460 | Disposition: A | Discharge status: Skilled Nursing Facility | Payer: Medicare Other | Attending: Neurosurgery | Admitting: Neurosurgery

## 2019-11-14 ENCOUNTER — Encounter (HOSPITAL_COMMUNITY): Payer: Self-pay | Admitting: Neurosurgery

## 2019-11-14 ENCOUNTER — Inpatient Hospital Stay (HOSPITAL_COMMUNITY): Payer: Medicare Other | Admitting: Certified Registered"

## 2019-11-14 ENCOUNTER — Inpatient Hospital Stay (HOSPITAL_COMMUNITY): Payer: Medicare Other

## 2019-11-14 ENCOUNTER — Encounter (HOSPITAL_COMMUNITY)
Admission: RE | Disposition: A | Discharge status: Skilled Nursing Facility | Payer: Self-pay | Source: Home / Self Care | Attending: Neurosurgery

## 2019-11-14 DIAGNOSIS — I2510 Atherosclerotic heart disease of native coronary artery without angina pectoris: Secondary | ICD-10-CM | POA: Diagnosis present

## 2019-11-14 DIAGNOSIS — N400 Enlarged prostate without lower urinary tract symptoms: Secondary | ICD-10-CM | POA: Diagnosis present

## 2019-11-14 DIAGNOSIS — Z20822 Contact with and (suspected) exposure to covid-19: Secondary | ICD-10-CM | POA: Diagnosis present

## 2019-11-14 DIAGNOSIS — J449 Chronic obstructive pulmonary disease, unspecified: Secondary | ICD-10-CM | POA: Diagnosis present

## 2019-11-14 DIAGNOSIS — Z981 Arthrodesis status: Secondary | ICD-10-CM | POA: Diagnosis not present

## 2019-11-14 DIAGNOSIS — Z8249 Family history of ischemic heart disease and other diseases of the circulatory system: Secondary | ICD-10-CM | POA: Diagnosis not present

## 2019-11-14 DIAGNOSIS — E669 Obesity, unspecified: Secondary | ICD-10-CM | POA: Diagnosis present

## 2019-11-14 DIAGNOSIS — Z6829 Body mass index (BMI) 29.0-29.9, adult: Secondary | ICD-10-CM | POA: Diagnosis not present

## 2019-11-14 DIAGNOSIS — E785 Hyperlipidemia, unspecified: Secondary | ICD-10-CM | POA: Diagnosis present

## 2019-11-14 DIAGNOSIS — Z419 Encounter for procedure for purposes other than remedying health state, unspecified: Secondary | ICD-10-CM

## 2019-11-14 DIAGNOSIS — M48062 Spinal stenosis, lumbar region with neurogenic claudication: Secondary | ICD-10-CM | POA: Diagnosis present

## 2019-11-14 DIAGNOSIS — Z85118 Personal history of other malignant neoplasm of bronchus and lung: Secondary | ICD-10-CM

## 2019-11-14 DIAGNOSIS — Z923 Personal history of irradiation: Secondary | ICD-10-CM

## 2019-11-14 DIAGNOSIS — F17210 Nicotine dependence, cigarettes, uncomplicated: Secondary | ICD-10-CM | POA: Diagnosis present

## 2019-11-14 DIAGNOSIS — Z9221 Personal history of antineoplastic chemotherapy: Secondary | ICD-10-CM

## 2019-11-14 DIAGNOSIS — Z79899 Other long term (current) drug therapy: Secondary | ICD-10-CM | POA: Diagnosis not present

## 2019-11-14 DIAGNOSIS — I10 Essential (primary) hypertension: Secondary | ICD-10-CM | POA: Diagnosis present

## 2019-11-14 HISTORY — DX: Personal history of urinary calculi: Z87.442

## 2019-11-14 HISTORY — DX: Respiratory tuberculosis unspecified: A15.9

## 2019-11-14 HISTORY — DX: Presence of spectacles and contact lenses: Z97.3

## 2019-11-14 HISTORY — DX: Unspecified osteoarthritis, unspecified site: M19.90

## 2019-11-14 LAB — TYPE AND SCREEN
ABO/RH(D): O POS
Antibody Screen: NEGATIVE

## 2019-11-14 LAB — BASIC METABOLIC PANEL
Anion gap: 12 (ref 5–15)
BUN: 12 mg/dL (ref 8–23)
CO2: 27 mmol/L (ref 22–32)
Calcium: 9.1 mg/dL (ref 8.9–10.3)
Chloride: 95 mmol/L — ABNORMAL LOW (ref 98–111)
Creatinine, Ser: 1.02 mg/dL (ref 0.61–1.24)
GFR calc Af Amer: >60 mL/min (ref >60)
GFR calc non Af Amer: >60 mL/min (ref >60)
Glucose, Bld: 113 mg/dL — ABNORMAL HIGH (ref 70–99)
Potassium: 3.6 mmol/L (ref 3.5–5.1)
Sodium: 134 mmol/L — ABNORMAL LOW (ref 135–145)

## 2019-11-14 LAB — CBC
HCT: 38.9 % — ABNORMAL LOW (ref 39.0–52.0)
Hemoglobin: 12.4 g/dL — ABNORMAL LOW (ref 13.0–17.0)
MCH: 26 pg (ref 26.0–34.0)
MCHC: 31.9 g/dL (ref 30.0–36.0)
MCV: 81.6 fL (ref 80.0–100.0)
Platelets: 299 10*3/uL (ref 150–400)
RBC: 4.77 MIL/uL (ref 4.22–5.81)
RDW: 15.1 % (ref 11.5–15.5)
WBC: 5.8 10*3/uL (ref 4.0–10.5)
nRBC: 0 % (ref 0.0–0.2)

## 2019-11-14 SURGERY — POSTERIOR LUMBAR FUSION 2 LEVEL
Anesthesia: General | Site: Spine Lumbar

## 2019-11-14 MED ORDER — SUGAMMADEX SODIUM 200 MG/2ML IV SOLN
INTRAVENOUS | Status: DC | PRN
  Administered 2019-11-14: 200 mg via INTRAVENOUS

## 2019-11-14 MED ORDER — TIOTROPIUM BROMIDE MONOHYDRATE 18 MCG IN CAPS: 18.0000 ug | ORAL_CAPSULE | Freq: Every day | RESPIRATORY_TRACT | Status: DC

## 2019-11-14 MED ORDER — LOSARTAN POTASSIUM 50 MG PO TABS
100.0000 mg | ORAL_TABLET | Freq: Every day | ORAL | Status: DC
  Administered 2019-11-15 – 2019-11-18 (×4): 100 mg via ORAL
  Filled 2019-11-14 (×4): qty 2

## 2019-11-14 MED ORDER — SODIUM CHLORIDE 0.9 % IV SOLN: 250.0000 mL | INTRAVENOUS | Status: DC

## 2019-11-14 MED ORDER — EPHEDRINE SULFATE-NACL 50-0.9 MG/10ML-% IV SOSY
PREFILLED_SYRINGE | INTRAVENOUS | Status: DC | PRN
  Administered 2019-11-14 (×4): 5 mg via INTRAVENOUS

## 2019-11-14 MED ORDER — MORPHINE SULFATE (PF) 2 MG/ML IV SOLN: 2.0000 mg | INTRAVENOUS | Status: DC | PRN

## 2019-11-14 MED ORDER — LIDOCAINE-EPINEPHRINE 0.5 %-1:200000 IJ SOLN
INTRAMUSCULAR | Status: AC
  Filled 2019-11-14: qty 1

## 2019-11-14 MED ORDER — CEFAZOLIN SODIUM 1 G IJ SOLR
INTRAMUSCULAR | Status: AC
  Filled 2019-11-14: qty 20

## 2019-11-14 MED ORDER — MENTHOL 3 MG MT LOZG: 1.0000 | LOZENGE | OROMUCOSAL | Status: DC | PRN

## 2019-11-14 MED ORDER — FENTANYL CITRATE (PF) 100 MCG/2ML IJ SOLN: 25.0000 ug | INTRAMUSCULAR | Status: DC | PRN

## 2019-11-14 MED ORDER — BUPIVACAINE HCL (PF) 0.5 % IJ SOLN
INTRAMUSCULAR | Status: DC | PRN
  Administered 2019-11-14: 30 mL

## 2019-11-14 MED ORDER — UMECLIDINIUM BROMIDE 62.5 MCG/INH IN AEPB
1.0000 | INHALATION_SPRAY | Freq: Every day | RESPIRATORY_TRACT | Status: DC
  Administered 2019-11-15 – 2019-11-18 (×4): 1 via RESPIRATORY_TRACT
  Filled 2019-11-14: qty 7

## 2019-11-14 MED ORDER — PROMETHAZINE HCL 25 MG/ML IJ SOLN: 6.2500 mg | INTRAMUSCULAR | Status: DC | PRN

## 2019-11-14 MED ORDER — LOSARTAN POTASSIUM-HCTZ 100-12.5 MG PO TABS: 1.0000 | ORAL_TABLET | Freq: Every day | ORAL | Status: DC

## 2019-11-14 MED ORDER — ALBUMIN HUMAN 5 % IV SOLN: INTRAVENOUS | Status: DC | PRN

## 2019-11-14 MED ORDER — ROCURONIUM BROMIDE 10 MG/ML (PF) SYRINGE
PREFILLED_SYRINGE | INTRAVENOUS | Status: DC | PRN
  Administered 2019-11-14: 100 mg via INTRAVENOUS
  Administered 2019-11-14: 10 mg via INTRAVENOUS
  Administered 2019-11-14: 20 mg via INTRAVENOUS
  Administered 2019-11-14: 30 mg via INTRAVENOUS
  Administered 2019-11-14: 20 mg via INTRAVENOUS

## 2019-11-14 MED ORDER — KETOROLAC TROMETHAMINE 15 MG/ML IJ SOLN
7.5000 mg | Freq: Four times a day (QID) | INTRAMUSCULAR | Status: AC
  Administered 2019-11-14 – 2019-11-15 (×3): 7.5 mg via INTRAVENOUS
  Filled 2019-11-14 (×4): qty 1

## 2019-11-14 MED ORDER — OXYCODONE HCL 5 MG PO TABS
5.0000 mg | ORAL_TABLET | ORAL | Status: DC | PRN
  Administered 2019-11-14 – 2019-11-16 (×5): 5 mg via ORAL
  Filled 2019-11-14 (×6): qty 1

## 2019-11-14 MED ORDER — ASPIRIN 81 MG PO CHEW
81.0000 mg | CHEWABLE_TABLET | Freq: Every day | ORAL | Status: DC
  Administered 2019-11-14: 81 mg via ORAL
  Filled 2019-11-14 (×2): qty 1

## 2019-11-14 MED ORDER — EPHEDRINE 5 MG/ML INJ
INTRAVENOUS | Status: AC
  Filled 2019-11-14: qty 10

## 2019-11-14 MED ORDER — FLUTICASONE PROPIONATE 50 MCG/ACT NA SUSP
2.0000 | Freq: Every day | NASAL | Status: DC | PRN
  Filled 2019-11-14: qty 16

## 2019-11-14 MED ORDER — SENNA 8.6 MG PO TABS
1.0000 | ORAL_TABLET | Freq: Two times a day (BID) | ORAL | Status: DC
  Administered 2019-11-14 – 2019-11-18 (×8): 8.6 mg via ORAL
  Filled 2019-11-14 (×8): qty 1

## 2019-11-14 MED ORDER — ONDANSETRON HCL 4 MG/2ML IJ SOLN
INTRAMUSCULAR | Status: AC
  Filled 2019-11-14: qty 2

## 2019-11-14 MED ORDER — DIAZEPAM 5 MG PO TABS
5.0000 mg | ORAL_TABLET | Freq: Four times a day (QID) | ORAL | Status: DC | PRN
  Administered 2019-11-15 – 2019-11-16 (×3): 5 mg via ORAL
  Filled 2019-11-14 (×3): qty 1

## 2019-11-14 MED ORDER — ALBUTEROL SULFATE (2.5 MG/3ML) 0.083% IN NEBU: 2.5000 mg | INHALATION_SOLUTION | Freq: Four times a day (QID) | RESPIRATORY_TRACT | Status: DC | PRN

## 2019-11-14 MED ORDER — AMLODIPINE BESYLATE 10 MG PO TABS
10.0000 mg | ORAL_TABLET | Freq: Every day | ORAL | Status: DC
  Administered 2019-11-15 – 2019-11-18 (×4): 10 mg via ORAL
  Filled 2019-11-14 (×4): qty 1

## 2019-11-14 MED ORDER — ONDANSETRON HCL 4 MG PO TABS: 4.0000 mg | ORAL_TABLET | Freq: Four times a day (QID) | ORAL | Status: DC | PRN

## 2019-11-14 MED ORDER — PHENOL 1.4 % MT LIQD: 1.0000 | OROMUCOSAL | Status: DC | PRN

## 2019-11-14 MED ORDER — LIDOCAINE 2% (20 MG/ML) 5 ML SYRINGE
INTRAMUSCULAR | Status: DC | PRN
  Administered 2019-11-14: 70 mg via INTRAVENOUS

## 2019-11-14 MED ORDER — MIDAZOLAM HCL 2 MG/2ML IJ SOLN
INTRAMUSCULAR | Status: AC
  Filled 2019-11-14: qty 2

## 2019-11-14 MED ORDER — OXYCODONE HCL ER 10 MG PO T12A
10.0000 mg | EXTENDED_RELEASE_TABLET | Freq: Two times a day (BID) | ORAL | Status: DC
  Administered 2019-11-14 – 2019-11-18 (×8): 10 mg via ORAL
  Filled 2019-11-14 (×8): qty 1

## 2019-11-14 MED ORDER — MOMETASONE FURO-FORMOTEROL FUM 200-5 MCG/ACT IN AERO
2.0000 | INHALATION_SPRAY | Freq: Two times a day (BID) | RESPIRATORY_TRACT | Status: DC
  Administered 2019-11-15 – 2019-11-18 (×7): 2 via RESPIRATORY_TRACT
  Filled 2019-11-14: qty 8.8

## 2019-11-14 MED ORDER — DEXAMETHASONE SODIUM PHOSPHATE 10 MG/ML IJ SOLN
INTRAMUSCULAR | Status: DC | PRN
  Administered 2019-11-14: 10 mg via INTRAVENOUS

## 2019-11-14 MED ORDER — LACTATED RINGERS IV SOLN: INTRAVENOUS | Status: DC

## 2019-11-14 MED ORDER — MAGNESIUM CITRATE PO SOLN
1.0000 | Freq: Once | ORAL | Status: AC | PRN
  Administered 2019-11-18: 11:00:00 1 via ORAL
  Filled 2019-11-14: qty 296

## 2019-11-14 MED ORDER — THROMBIN 5000 UNITS EX SOLR
CUTANEOUS | Status: AC
  Filled 2019-11-14: qty 5000

## 2019-11-14 MED ORDER — FENTANYL CITRATE (PF) 250 MCG/5ML IJ SOLN
INTRAMUSCULAR | Status: AC
  Filled 2019-11-14: qty 5

## 2019-11-14 MED ORDER — ROCURONIUM BROMIDE 10 MG/ML (PF) SYRINGE
PREFILLED_SYRINGE | INTRAVENOUS | Status: AC
  Filled 2019-11-14: qty 20

## 2019-11-14 MED ORDER — ONDANSETRON HCL 4 MG/2ML IJ SOLN: 4.0000 mg | Freq: Four times a day (QID) | INTRAMUSCULAR | Status: DC | PRN

## 2019-11-14 MED ORDER — LIDOCAINE-EPINEPHRINE 0.5 %-1:200000 IJ SOLN
INTRAMUSCULAR | Status: DC | PRN
  Administered 2019-11-14: 30 mL

## 2019-11-14 MED ORDER — FINASTERIDE 5 MG PO TABS
5.0000 mg | ORAL_TABLET | Freq: Every day | ORAL | Status: DC
  Administered 2019-11-14: 5 mg via ORAL
  Filled 2019-11-14 (×2): qty 1

## 2019-11-14 MED ORDER — BUPIVACAINE HCL (PF) 0.5 % IJ SOLN
INTRAMUSCULAR | Status: AC
  Filled 2019-11-14: qty 30

## 2019-11-14 MED ORDER — CEFAZOLIN SODIUM-DEXTROSE 2-3 GM-%(50ML) IV SOLR
INTRAVENOUS | Status: DC | PRN
  Administered 2019-11-14 (×2): 2 g via INTRAVENOUS

## 2019-11-14 MED ORDER — ATORVASTATIN CALCIUM 40 MG PO TABS
40.0000 mg | ORAL_TABLET | Freq: Every day | ORAL | Status: DC
  Administered 2019-11-15 – 2019-11-18 (×4): 40 mg via ORAL
  Filled 2019-11-14 (×4): qty 1

## 2019-11-14 MED ORDER — ONDANSETRON HCL 4 MG/2ML IJ SOLN
INTRAMUSCULAR | Status: DC | PRN
  Administered 2019-11-14: 4 mg via INTRAVENOUS

## 2019-11-14 MED ORDER — VITAMIN D 25 MCG (1000 UNIT) PO TABS
5000.0000 [IU] | ORAL_TABLET | Freq: Every day | ORAL | Status: DC
  Administered 2019-11-14 – 2019-11-18 (×5): 5000 [IU] via ORAL
  Filled 2019-11-14 (×5): qty 5

## 2019-11-14 MED ORDER — OXYCODONE HCL 5 MG PO TABS
10.0000 mg | ORAL_TABLET | ORAL | Status: DC | PRN
  Administered 2019-11-18 (×2): 10 mg via ORAL
  Filled 2019-11-14 (×2): qty 2

## 2019-11-14 MED ORDER — CHLORHEXIDINE GLUCONATE CLOTH 2 % EX PADS
6.0000 | MEDICATED_PAD | Freq: Every day | CUTANEOUS | Status: DC
  Administered 2019-11-14 – 2019-11-17 (×2): 6 via TOPICAL

## 2019-11-14 MED ORDER — ACETAMINOPHEN 325 MG PO TABS
650.0000 mg | ORAL_TABLET | ORAL | Status: DC | PRN
  Administered 2019-11-15 – 2019-11-16 (×2): 650 mg via ORAL
  Filled 2019-11-14 (×2): qty 2

## 2019-11-14 MED ORDER — POTASSIUM CHLORIDE IN NACL 20-0.9 MEQ/L-% IV SOLN: INTRAVENOUS | Status: DC

## 2019-11-14 MED ORDER — PHENYLEPHRINE HCL-NACL 10-0.9 MG/250ML-% IV SOLN
INTRAVENOUS | Status: DC | PRN
  Administered 2019-11-14: 25 ug/min via INTRAVENOUS

## 2019-11-14 MED ORDER — CELECOXIB 200 MG PO CAPS
400.0000 mg | ORAL_CAPSULE | Freq: Once | ORAL | Status: AC
  Administered 2019-11-14: 400 mg via ORAL
  Filled 2019-11-14: qty 2

## 2019-11-14 MED ORDER — PROPOFOL 10 MG/ML IV BOLUS
INTRAVENOUS | Status: DC | PRN
  Administered 2019-11-14: 130 mg via INTRAVENOUS

## 2019-11-14 MED ORDER — TAMSULOSIN HCL 0.4 MG PO CAPS
0.4000 mg | ORAL_CAPSULE | Freq: Every day | ORAL | Status: DC
  Administered 2019-11-14: 0.4 mg via ORAL
  Filled 2019-11-14 (×2): qty 1

## 2019-11-14 MED ORDER — SENNOSIDES-DOCUSATE SODIUM 8.6-50 MG PO TABS
1.0000 | ORAL_TABLET | Freq: Every evening | ORAL | Status: DC | PRN
  Administered 2019-11-15: 1 via ORAL
  Filled 2019-11-14: qty 1

## 2019-11-14 MED ORDER — PROPOFOL 10 MG/ML IV BOLUS
INTRAVENOUS | Status: AC
  Filled 2019-11-14: qty 20

## 2019-11-14 MED ORDER — TIOTROPIUM BROMIDE MONOHYDRATE 2.5 MCG/ACT IN AERS: 1.0000 | INHALATION_SPRAY | Freq: Every day | RESPIRATORY_TRACT | Status: DC

## 2019-11-14 MED ORDER — PHENYLEPHRINE 40 MCG/ML (10ML) SYRINGE FOR IV PUSH (FOR BLOOD PRESSURE SUPPORT)
PREFILLED_SYRINGE | INTRAVENOUS | Status: DC | PRN
  Administered 2019-11-14: 80 ug via INTRAVENOUS

## 2019-11-14 MED ORDER — FENTANYL CITRATE (PF) 250 MCG/5ML IJ SOLN
INTRAMUSCULAR | Status: DC | PRN
  Administered 2019-11-14: 50 ug via INTRAVENOUS
  Administered 2019-11-14: 100 ug via INTRAVENOUS
  Administered 2019-11-14 (×2): 50 ug via INTRAVENOUS

## 2019-11-14 MED ORDER — IPRATROPIUM-ALBUTEROL 0.5-2.5 (3) MG/3ML IN SOLN
RESPIRATORY_TRACT | Status: AC
  Filled 2019-11-14: qty 3

## 2019-11-14 MED ORDER — THROMBIN 5000 UNITS EX SOLR
OROMUCOSAL | Status: DC | PRN
  Administered 2019-11-14: 5 mL

## 2019-11-14 MED ORDER — HYDROCHLOROTHIAZIDE 12.5 MG PO CAPS
12.5000 mg | ORAL_CAPSULE | Freq: Every day | ORAL | Status: DC
  Administered 2019-11-15 – 2019-11-18 (×4): 12.5 mg via ORAL
  Filled 2019-11-14 (×4): qty 1

## 2019-11-14 MED ORDER — 0.9 % SODIUM CHLORIDE (POUR BTL) OPTIME
TOPICAL | Status: DC | PRN
  Administered 2019-11-14 (×2): 1000 mL

## 2019-11-14 MED ORDER — THROMBIN 20000 UNITS EX SOLR
CUTANEOUS | Status: DC | PRN
  Administered 2019-11-14: 20 mL

## 2019-11-14 MED ORDER — PHENYLEPHRINE 40 MCG/ML (10ML) SYRINGE FOR IV PUSH (FOR BLOOD PRESSURE SUPPORT)
PREFILLED_SYRINGE | INTRAVENOUS | Status: AC
  Filled 2019-11-14: qty 10

## 2019-11-14 MED ORDER — THROMBIN 20000 UNITS EX SOLR
CUTANEOUS | Status: AC
  Filled 2019-11-14: qty 20000

## 2019-11-14 MED ORDER — DEXAMETHASONE SODIUM PHOSPHATE 10 MG/ML IJ SOLN
INTRAMUSCULAR | Status: AC
  Filled 2019-11-14: qty 1

## 2019-11-14 MED ORDER — LACTATED RINGERS IV SOLN: INTRAVENOUS | Status: DC | PRN

## 2019-11-14 MED ORDER — SODIUM CHLORIDE 0.9% FLUSH: 3.0000 mL | INTRAVENOUS | Status: DC | PRN

## 2019-11-14 MED ORDER — LORAZEPAM 0.5 MG PO TABS: 0.5000 mg | ORAL_TABLET | Freq: Every day | ORAL | Status: DC | PRN

## 2019-11-14 MED ORDER — SODIUM CHLORIDE 0.9% FLUSH
3.0000 mL | Freq: Two times a day (BID) | INTRAVENOUS | Status: DC
  Administered 2019-11-14 – 2019-11-17 (×7): 3 mL via INTRAVENOUS

## 2019-11-14 MED ORDER — LIDOCAINE 2% (20 MG/ML) 5 ML SYRINGE
INTRAMUSCULAR | Status: AC
  Filled 2019-11-14: qty 5

## 2019-11-14 MED ORDER — ACETAMINOPHEN 650 MG RE SUPP: 650.0000 mg | RECTAL | Status: DC | PRN

## 2019-11-14 MED ORDER — ACETAMINOPHEN 500 MG PO TABS
1000.0000 mg | ORAL_TABLET | Freq: Once | ORAL | Status: AC
  Administered 2019-11-14: 08:00:00 1000 mg via ORAL
  Filled 2019-11-14: qty 2

## 2019-11-14 MED ORDER — BISACODYL 5 MG PO TBEC
5.0000 mg | DELAYED_RELEASE_TABLET | Freq: Every day | ORAL | Status: DC | PRN
  Administered 2019-11-15: 5 mg via ORAL
  Filled 2019-11-14: qty 1

## 2019-11-14 SURGICAL SUPPLY — 61 items
BAG DECANTER FOR FLEXI CONT (MISCELLANEOUS) IMPLANT
BENZOIN TINCTURE PRP APPL 2/3 (GAUZE/BANDAGES/DRESSINGS) IMPLANT
BIT DRILL PLIF MAS DISP 5.5MM (DRILL) ×1 IMPLANT
BLADE CLIPPER SURG (BLADE) IMPLANT
BUR MATCHSTICK NEURO 3.0 LAGG (BURR) ×3 IMPLANT
BUR PRECISION FLUTE 5.0 (BURR) ×3 IMPLANT
CAGE POST IBF 11X4D 26/9 (Cage) ×6 IMPLANT
CAGE POST IBF 11X8D 26/9 (Cage) ×6 IMPLANT
CANISTER SUCT 3000ML PPV (MISCELLANEOUS) ×3 IMPLANT
CAP RELINE MOD TULIP RMM (Cap) ×18 IMPLANT
CARTRIDGE OIL MAESTRO DRILL (MISCELLANEOUS) ×1 IMPLANT
CLOSURE WOUND 1/2 X4 (GAUZE/BANDAGES/DRESSINGS) ×1
CNTNR URN SCR LID CUP LEK RST (MISCELLANEOUS) ×1 IMPLANT
CONT SPEC 4OZ STRL OR WHT (MISCELLANEOUS) ×2
COVER BACK TABLE 60X90IN (DRAPES) ×3 IMPLANT
COVER WAND RF STERILE (DRAPES) IMPLANT
DECANTER SPIKE VIAL GLASS SM (MISCELLANEOUS) ×3 IMPLANT
DERMABOND ADVANCED (GAUZE/BANDAGES/DRESSINGS) ×2
DERMABOND ADVANCED .7 DNX12 (GAUZE/BANDAGES/DRESSINGS) ×1 IMPLANT
DIFFUSER DRILL AIR PNEUMATIC (MISCELLANEOUS) ×3 IMPLANT
DRAPE C-ARM 42X72 X-RAY (DRAPES) ×6 IMPLANT
DRAPE C-ARMOR (DRAPES) ×3 IMPLANT
DRAPE LAPAROTOMY 100X72X124 (DRAPES) ×3 IMPLANT
DRAPE SURG 17X23 STRL (DRAPES) ×3 IMPLANT
DRILL PLIF MAS DISP 5.5MM (DRILL) ×3
DRSG OPSITE POSTOP 4X8 (GAUZE/BANDAGES/DRESSINGS) ×3 IMPLANT
DURAPREP 26ML APPLICATOR (WOUND CARE) ×3 IMPLANT
ELECT REM PT RETURN 9FT ADLT (ELECTROSURGICAL) ×3
ELECTRODE REM PT RTRN 9FT ADLT (ELECTROSURGICAL) ×1 IMPLANT
GAUZE 4X4 16PLY RFD (DISPOSABLE) IMPLANT
GAUZE SPONGE 4X4 12PLY STRL (GAUZE/BANDAGES/DRESSINGS) IMPLANT
GLOVE ECLIPSE 6.5 STRL STRAW (GLOVE) ×6 IMPLANT
GOWN STRL REUS W/ TWL LRG LVL3 (GOWN DISPOSABLE) ×4 IMPLANT
GOWN STRL REUS W/ TWL XL LVL3 (GOWN DISPOSABLE) IMPLANT
GOWN STRL REUS W/TWL 2XL LVL3 (GOWN DISPOSABLE) IMPLANT
GOWN STRL REUS W/TWL LRG LVL3 (GOWN DISPOSABLE) ×8
GOWN STRL REUS W/TWL XL LVL3 (GOWN DISPOSABLE)
KIT BASIN OR (CUSTOM PROCEDURE TRAY) ×3 IMPLANT
KIT POSITION SURG JACKSON T1 (MISCELLANEOUS) ×3 IMPLANT
KIT TURNOVER KIT B (KITS) ×3 IMPLANT
MILL MEDIUM DISP (BLADE) ×3 IMPLANT
NEEDLE HYPO 25X1 1.5 SAFETY (NEEDLE) ×3 IMPLANT
NEEDLE SPNL 18GX3.5 QUINCKE PK (NEEDLE) ×3 IMPLANT
NS IRRIG 1000ML POUR BTL (IV SOLUTION) ×6 IMPLANT
OIL CARTRIDGE MAESTRO DRILL (MISCELLANEOUS) ×3
PACK LAMINECTOMY NEURO (CUSTOM PROCEDURE TRAY) ×3 IMPLANT
PAD ARMBOARD 7.5X6 YLW CONV (MISCELLANEOUS) ×9 IMPLANT
ROD RELINE-O COCR 5.0X55MM (Rod) ×6 IMPLANT
SCREW LOCK RSS 4.5/5.0MM (Screw) ×18 IMPLANT
SCREW SHANK RELINE MOD 5.5X35 (Screw) ×18 IMPLANT
SPONGE LAP 4X18 RFD (DISPOSABLE) IMPLANT
SPONGE SURGIFOAM ABS GEL 100 (HEMOSTASIS) ×3 IMPLANT
STRIP CLOSURE SKIN 1/2X4 (GAUZE/BANDAGES/DRESSINGS) ×2 IMPLANT
SUT PROLENE 6 0 BV (SUTURE) IMPLANT
SUT VIC AB 0 CT1 18XCR BRD8 (SUTURE) ×2 IMPLANT
SUT VIC AB 0 CT1 8-18 (SUTURE) ×4
SUT VIC AB 2-0 CT1 18 (SUTURE) ×6 IMPLANT
SUT VIC AB 3-0 SH 8-18 (SUTURE) ×6 IMPLANT
TOWEL GREEN STERILE (TOWEL DISPOSABLE) ×3 IMPLANT
TOWEL GREEN STERILE FF (TOWEL DISPOSABLE) ×3 IMPLANT
WATER STERILE IRR 1000ML POUR (IV SOLUTION) ×3 IMPLANT

## 2019-11-14 NOTE — Anesthesia Procedure Notes (Signed)
Procedure Name: Intubation Date/Time: 11/14/2019 9:21 AM Performed by: Gaylene Brooks, CRNA Pre-anesthesia Checklist: Patient identified, Emergency Drugs available, Suction available and Patient being monitored Patient Re-evaluated:Patient Re-evaluated prior to induction Oxygen Delivery Method: Circle System Utilized Preoxygenation: Pre-oxygenation with 100% oxygen Induction Type: IV induction Ventilation: Mask ventilation without difficulty and Oral airway inserted - appropriate to patient size Laryngoscope Size: Sabra Heck and 2 Grade View: Grade I Tube type: Oral Tube size: 7.5 mm Number of attempts: 1 Airway Equipment and Method: Stylet and Oral airway Placement Confirmation: ETT inserted through vocal cords under direct vision,  positive ETCO2 and breath sounds checked- equal and bilateral Secured at: 22 cm Tube secured with: Tape Dental Injury: Teeth and Oropharynx as per pre-operative assessment

## 2019-11-14 NOTE — Progress Notes (Signed)
Orthopedic Tech Progress Note Patient Details:  Zamire Whitehurst [facility] 1946-08-24 [id number] Fitted patient with LSO Back brace. Patient ID: Aziz Slape, male   DOB: [birth year], 74 y.o.   MRN: [id number]   Janit Pagan 11/14/2019, 6:31 PM

## 2019-11-14 NOTE — Op Note (Signed)
11/14/2019  3:18 PM  PATIENT:  Colton Nguyen  74 y.o. 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 He also has neurogenic claudication. I recommended a decompression and arthrodesis and he is in agreement.   PRE-OPERATIVE DIAGNOSIS:  Lumbar stenosis with neurogenic claudication L3-5  POST-OPERATIVE DIAGNOSIS:  Lumbar stenosis with neurogenic claudication L3-5  PROCEDURE:  Procedure(s): Lumbar Three-Four Lumbar Four-Five Posterior lumbar interbody arthrodesis 11x27x43mm x4(Synthes Conduit cages) autograft morsels Lumbar laminectomy L3, L4 in excess of the needed exposure for a plif Segmental posterior pedicle screw fixation L3-L5(nuvasive Masplif) SURGEON:  Surgeon(s): Ashok Pall, MD Earnie Larsson, MD  ASSISTANTS:Pool,Henry  ANESTHESIA:   general  EBL:  Total I/O In: 2900 [I.V.:2400; IV Piggyback:500] Out: 960 [Urine:510; Blood:450]  BLOOD ADMINISTERED:none  CELL SAVER GIVEN:none  COUNT:per nursing  DRAINS: none   SPECIMEN:  No Specimen  DICTATION: Colton Nguyen is a 74 y.o. male whom was taken to the operating room intubated, and placed under a general anesthetic without difficulty. A foley catheter was placed under sterile conditions. He was positioned prone on a Jackson stable with all pressure points properly padded.  His lumbar region was prepped and draped in a sterile manner. I infiltrated 20cc's 1/2%lidocaine/1:2000,000 strength epinephrine into the planned incision. I opened the skin with a 10 blade and took the incision down to the thoracolumbar fascia. I exposed the lamina of L2,3,4,and 5 in a subperiosteal fashion bilaterally. I confirmed my location with an intraoperative xray.  I placed self retaining retractors and started the decompression.  I decompressed the spinal canal via complete laminectomies of L4, and L3. I also performed inferior facetectomies of L3, and L4 bilaterally. I used the drill, and Kerrison punches to remove the bone and  the ligamentum flavum. This maneuver decompressed the spinal canal and the L3,and L4 nerve roots. The nerve roots were decompressed through the foramina and lateral recesses bilaterally. The dissection was well lateral to the canal, and beyond the needed exposure of the disc space. PLIF's were performed at L3/4,4/5 in the same fashion. I opened the disc space with a 15 blade then used a variety of instruments to remove the disc and prepare the space for the arthrodesis. I used curettes, rongeurs, punches, shavers for the disc space, and rasps in the discetomy. I measured the disc space and placed 4 11x86mm conduit titanium cages(Synthes) into the disc space(s). I packed autograft morsels in and around the cages in the disc spaces.  We placed pedicle screws at L3, 4, and 5, using fluoroscopic guidance. I drilled a pilot hole, then cannulated the pedicle with a drill and tap at each site. We tapped each pedicle, assessing each site for pedicle violations. No cutouts were appreciated. Screws (nuvasive maspllif) were then placed at each site without difficulty. We attached rods and locking caps with the appropriate tools. The locking caps were secured with torque limited screwdrivers. Final films were performed and the final construct appeared to be in good position.  I closed the wound in a layered fashion. I approximated the thoracolumbar fascia, subcutaneous, and subcuticular planes with vicryl sutures. I used dermabond and an occlusive bandage for a sterile dressing.     PLAN OF CARE: Admit to inpatient   PATIENT DISPOSITION:  PACU - hemodynamically stable.   Delay start of Pharmacological VTE agent (>24hrs) due to surgical blood loss or risk of bleeding:  yes

## 2019-11-14 NOTE — Transfer of Care (Signed)
Immediate Anesthesia Transfer of Care Note  Patient: Colton Nguyen  Procedure(s) Performed: Lumbar Three-Four Lumbar Four-Five Posterior lumbar interbody (N/A Spine Lumbar)  Patient Location: PACU  Anesthesia Type:General  Level of Consciousness: drowsy and patient cooperative  Airway & Oxygen Therapy: Patient Spontanous Breathing and Patient connected to face mask oxygen  Post-op Assessment: Report given to RN, Post -op Vital signs reviewed and stable and Patient moving all extremities X 4  Post vital signs: Reviewed and stable  Last Vitals:  Vitals Value Taken Time  BP    Temp    Pulse    Resp    SpO2      Last Pain:  Vitals:   11/14/19 0711  TempSrc:   PainSc: 0-No pain      Patients Stated Pain Goal: 3 (73/56/70 1410)  Complications: No apparent anesthesia complications   DuoNeb started in postop, discussed with Dr. Tobias Alexander patients respiratory rate and audible wheezing.

## 2019-11-14 NOTE — H&P (Signed)
Colton Nguyen presents today for a lumbar decompression and arthrodesis due to lumbar stenosis with neurogenic claudication. No Known Allergies Past Medical History:  Diagnosis Date  . Anxiety    situational  . Arthritis   . BPH (benign prostatic hyperplasia)   . CAD (coronary artery disease)   . CHF (congestive heart failure) ([place])    pt denies 11/13/2019  . Chronic cardiopulmonary disease ([place])   . COPD (chronic obstructive pulmonary disease) ([place])   . History of kidney stones   . Hyperlipidemia   . Hypertension   . Hypertension 12/28/2016  . lung ca dx'd 03/2015  . Mass of lung   . Nicotine dependence   . Personal history of kidney stones   . Restless leg   . Shortness of breath dyspnea    with exertion  . Tuberculosis   . Wears glasses    Past Surgical History:  Procedure Laterality Date  . BACK SURGERY    . CAD CCTA 01/31/15    . CERVICAL FUSION    . COLONOSCOPY    . FRACTURE SURGERY     left ankle  . LUNG BIOPSY N/A 02/27/2015   Procedure: LUNG BIOPSY;  Surgeon: Grace Isaac, MD;  Location: [place];  Service: Thoracic;  Laterality: N/A;  . right inguinal hernia repair at age 67    . VIDEO BRONCHOSCOPY WITH ENDOBRONCHIAL ULTRASOUND N/A 02/27/2015   Procedure: VIDEO BRONCHOSCOPY WITH ENDOBRONCHIAL ULTRASOUND;  Surgeon: Grace Isaac, MD;  Location: [facility] OR;  Service: Thoracic;  Laterality: N/A;   Family History  Problem Relation Age of Onset  . Cancer Other   . Heart disease Father   . Hypertension Other    Social History   Socioeconomic History  . Marital status: Divorced    Spouse name: Not on file  . Number of children: Not on file  . Years of education: Not on file  . Highest education level: Not on file  Occupational History  . Not on file  Tobacco Use  . Smoking status: Current Every Day Smoker    Packs/day: 1.50    Years: 47.00    Pack years: 70.50    Types: Cigarettes    Start date: 02/14/1967  . Smokeless tobacco: Never Used  Substance and  Sexual Activity  . Alcohol use: Yes    Alcohol/week: 0.0 standard drinks    Comment: ocassional  . Drug use: No  . Sexual activity: Yes  Other Topics Concern  . Not on file  Social History Narrative  . Not on file   Social Determinants of Health   Financial Resource Strain:   . Difficulty of Paying Living Expenses: Not on file  Food Insecurity:   . Worried About Charity fundraiser in the Last Year: Not on file  . Ran Out of Food in the Last Year: Not on file  Transportation Needs:   . Lack of Transportation (Medical): Not on file  . Lack of Transportation (Non-Medical): Not on file  Physical Activity:   . Days of Exercise per Week: Not on file  . Minutes of Exercise per Session: Not on file  Stress:   . Feeling of Stress : Not on file  Social Connections:   . Frequency of Communication with Friends and Family: Not on file  . Frequency of Social Gatherings with Friends and Family: Not on file  . Attends Religious Services: Not on file  . Active Member of Clubs or Organizations: Not on file  .  Attends Archivist Meetings: Not on file  . Marital Status: Not on file  Intimate Partner Violence:   . Fear of Current or Ex-Partner: Not on file  . Emotionally Abused: Not on file  . Physically Abused: Not on file  . Sexually Abused: Not on file   Physical Exam Constitutional:      Appearance: Normal appearance. He is obese.  HENT:     Head: Normocephalic and atraumatic.     Nose: Nose normal.     Mouth/Throat:     Mouth: Mucous membranes are dry.     Pharynx: Oropharynx is clear.  Eyes:     Extraocular Movements: Extraocular movements intact.     Conjunctiva/sclera: Conjunctivae normal.     Pupils: Pupils are equal, round, and reactive to light.  Cardiovascular:     Rate and Rhythm: Normal rate and regular rhythm.  Pulmonary:     Effort: Pulmonary effort is normal.     Breath sounds: Normal breath sounds.  Abdominal:     General: Abdomen is flat.      Palpations: Abdomen is soft.  Musculoskeletal:        General: Normal range of motion.     Cervical back: Normal range of motion and neck supple.  Skin:    General: Skin is warm and dry.  Neurological:     General: No focal deficit present.     Mental Status: He is oriented to person, place, and time.     Cranial Nerves: No cranial nerve deficit.     Sensory: No sensory deficit.     Motor: No weakness.     Coordination: Coordination normal.     Gait: Gait abnormal.     Deep Tendon Reflexes: Reflexes normal.  Psychiatric:        Mood and Affect: Mood normal.        Behavior: Behavior normal.        Thought Content: Thought content normal.        Judgment: Judgment normal.   Admit for L3/4, 4/5 plif. Risks and benefits explained including but not limited to bleeding, infection, continued or worse pain, weakness, bowel and or bladder dysfunction, hardware failure, fusion failure. He understands and wishes to proceed.

## 2019-11-14 NOTE — Anesthesia Postprocedure Evaluation (Signed)
Anesthesia Post Note  Patient: Colton Nguyen  Procedure(s) Performed: Lumbar Three-Four Lumbar Four-Five Posterior lumbar interbody (N/A Spine Lumbar)     Patient location during evaluation: PACU Anesthesia Type: General Level of consciousness: sedated Pain management: pain level controlled Vital Signs Assessment: post-procedure vital signs reviewed and stable Respiratory status: spontaneous breathing and respiratory function stable Cardiovascular status: stable Postop Assessment: no apparent nausea or vomiting Anesthetic complications: no    Last Vitals:  Vitals:   11/14/19 1707 11/14/19 1926  BP: 133/65 (!) 128/58  Pulse: 79 86  Resp: (!) 24 18  Temp: 36.9 C 36.8 C  SpO2:  97%    Last Pain:  Vitals:   11/14/19 1930  TempSrc:   PainSc: [place]

## 2019-11-15 MED ORDER — ASPIRIN 81 MG PO CHEW
81.0000 mg | CHEWABLE_TABLET | Freq: Every day | ORAL | Status: DC
  Administered 2019-11-15 – 2019-11-17 (×3): 81 mg via ORAL
  Filled 2019-11-15 (×3): qty 1

## 2019-11-15 MED ORDER — FINASTERIDE 5 MG PO TABS
5.0000 mg | ORAL_TABLET | Freq: Every day | ORAL | Status: DC
  Administered 2019-11-15 – 2019-11-17 (×3): 5 mg via ORAL
  Filled 2019-11-15 (×3): qty 1

## 2019-11-15 MED ORDER — TAMSULOSIN HCL 0.4 MG PO CAPS
0.4000 mg | ORAL_CAPSULE | Freq: Every day | ORAL | Status: DC
  Administered 2019-11-15 – 2019-11-17 (×3): 0.4 mg via ORAL
  Filled 2019-11-15 (×3): qty 1

## 2019-11-15 NOTE — Progress Notes (Signed)
Pt was transferred to 5N28 via bed hooked on portable oxygen tank while transport; no acute symptoms of respiratory distress or complaints of pain nor discomfort; belongings tucked by his bedside; report given to RN Mickel Baas who is assigned to his care.

## 2019-11-15 NOTE — Evaluation (Signed)
Physical Therapy Evaluation Patient Details Name: Colton Nguyen MRN: [id number] DOB: 1945-12-04 Today's Date: 11/15/2019   History of Present Illness  Pt is a 74 y/o male s/p L3-L5 PLIF. PMH including but not limited to CAD, CHF, COPD and HTN.  Clinical Impression  Pt presented supine in bed with HOB elevated, awake and willing to participate in therapy session. Prior to admission, pt reported that he ambulated with use of a cane and was independent with ADLs. Pt lives alone in single level home with a few steps to enter. At the time of evaluation, pt limited overall secondary to pain, weakness and fatigue. He was able to perform bed mobility with min A (cueing for log roll technique) and transfers with min A. Currently recommend further intensive therapy services at a SNF to maximize his independence with functional mobility prior to returning home alone. PT will continue to follow pt acutely to progress mobility as tolerated.     Follow Up Recommendations SNF    Equipment Recommendations  None recommended by PT    Recommendations for Other Services       Precautions / Restrictions Precautions Precautions: Fall;Back Precaution Booklet Issued: Yes (comment) Precaution Comments: provided handout Restrictions Weight Bearing Restrictions: No      Mobility  Bed Mobility Overal bed mobility: Needs Assistance Bed Mobility: Rolling;Sidelying to Sit Rolling: Supervision Sidelying to sit: Min assist       General bed mobility comments: use of bed rail, cueing for log roll technique, assistance needed to move bilateral LEs off of bed and for trunk elevation  Transfers Overall transfer level: Needs assistance Equipment used: Rolling walker (2 wheeled) Transfers: Sit to/from [employer] Transfers Sit to Stand: From elevated surface;Min assist Stand pivot transfers: Min assist      Lateral/Scoot Transfers: Min assist General transfer comment: bed in elevated position,  cueing for safe hand placement, min A needed to power into standing from EOB and for stability with pivotal movement to recliner chair towards pt's L side  Ambulation/Gait                Stairs            Wheelchair Mobility    Modified Rankin (Stroke Patients Only)       Balance Overall balance assessment: Needs assistance Sitting-balance support: No upper extremity supported;Feet supported Sitting balance-Leahy Scale: Fair     Standing balance support: Bilateral upper extremity supported;During functional activity Standing balance-Leahy Scale: Poor Standing balance comment: unable to progress into standing attempted x2 with elevated surface                             Pertinent Vitals/Pain Pain Assessment: Faces Pain Score: 4  Faces Pain Scale: Hurts little more Pain Location: R hip, back Pain Descriptors / Indicators: Spasm Pain Intervention(s): Repositioned;Monitored during session    Home Living Family/patient expects to be discharged to:: Private residence Living Arrangements: Alone Available Help at Discharge: Family;Available PRN/intermittently Type of Home: House Home Access: Stairs to enter Entrance Stairs-Rails: Can reach both   Home Layout: One level Home Equipment: Cane - single point Additional Comments: pt on 2lnc at home prn     Prior Function Level of Independence: Independent with assistive device(s)         Comments: ambulates with use of a cane     Hand Dominance   Dominant Hand: Right    Extremity/Trunk Assessment   Upper Extremity Assessment  Upper Extremity Assessment: Generalized weakness    Lower Extremity Assessment Lower Extremity Assessment: Generalized weakness LLE Deficits / Details: LLE weaker than RLE;unable to support weight in standing    Cervical / Trunk Assessment Cervical / Trunk Assessment: Other exceptions Cervical / Trunk Exceptions: s/p lumbar sx  Communication   Communication: No  difficulties  Cognition Arousal/Alertness: Awake/alert Behavior During Therapy: WFL for tasks assessed/performed Overall Cognitive Status: Within Functional Limits for tasks assessed                                        General Comments General comments (skin integrity, edema, etc.): Pt on 2lnc throughout session, increased DoE with exertion about 3/4 SpO2 >95% throughout    Exercises     Assessment/Plan    PT Assessment Patient needs continued PT services  PT Problem List Decreased strength;Decreased range of motion;Decreased activity tolerance;Decreased balance;Decreased mobility;Decreased coordination;Decreased knowledge of use of DME;Decreased safety awareness;Decreased knowledge of precautions;Pain       PT Treatment Interventions DME instruction;Stair training;Gait training;Functional mobility training;Therapeutic activities;Therapeutic exercise;Balance training;Neuromuscular re-education;Patient/family education    PT Goals (Current goals can be found in the Care Plan section)  Acute Rehab PT Goals Patient Stated Goal: to get stronger PT Goal Formulation: With patient Time For Goal Achievement: 11/29/19 Potential to Achieve Goals: Good    Frequency Min 5X/week   Barriers to discharge        Co-evaluation               AM-PAC PT "6 Clicks" Mobility  Outcome Measure Help needed turning from your back to your side while in a flat bed without using bedrails?: None Help needed moving from lying on your back to sitting on the side of a flat bed without using bedrails?: A Little Help needed moving to and from a bed to a chair (including a wheelchair)?: A Little Help needed standing up from a chair using your arms (e.g., wheelchair or bedside chair)?: A Little Help needed to walk in hospital room?: A Lot Help needed climbing 3-5 steps with a railing? : Total 6 Click Score: 16    End of Session Equipment Utilized During Treatment: Gait  belt Activity Tolerance: Patient tolerated treatment well Patient left: in chair;with call bell/phone within reach;with chair alarm set Nurse Communication: Mobility status PT Visit Diagnosis: Other abnormalities of gait and mobility (R26.89)    Time: 7124-5809 PT Time Calculation (min) (ACUTE ONLY): 19 min   Charges:   PT Evaluation $PT Eval Moderate Complexity: 1 Mod          Eduard Clos, PT, DPT  Acute Rehabilitation Services Pager [phone] Office [place] 11/15/2019, 3:38 PM

## 2019-11-15 NOTE — Plan of Care (Signed)
  Problem: Education: Goal: Ability to verbalize activity precautions or restrictions will improve Outcome: Progressing   Problem: Bowel/Gastric: Goal: Gastrointestinal status for postoperative course will improve Outcome: Progressing   Problem: Clinical Measurements: Goal: Ability to maintain clinical measurements within normal limits will improve Outcome: Progressing   Problem: Pain Management: Goal: Pain level will decrease Outcome: Progressing   Problem: Skin Integrity: Goal: Will show signs of wound healing Outcome: Progressing   Problem: Health Behavior/Discharge Planning: Goal: Identification of resources available to assist in meeting health care needs will improve Outcome: Progressing

## 2019-11-15 NOTE — Evaluation (Signed)
Occupational Therapy Evaluation Patient Details Name: Colton Nguyen MRN: [id number] DOB: [birth year] Today's Date: 11/15/2019    History of Present Illness Colton Nguyen  74 y.o. 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    Clinical Impression   PTA, pt was living at home alone, pt reports he was independent with ADL/IADL and was driving. Reports using spc for functional mobility. Pt currently demonstrates decreased strength in bilateral LE, he [place] unable to progress into standing this date due to BLE weakness. He required minA to lateral scoot to drop arm recliner. Began education on use of AE to assist with LB dressing. Due to decline in current level of function, pt would benefit from acute OT to address established goals to facilitate safe D/C to venue listed below. At this time, recommend SNF follow-up. Will continue to follow acutely.     Follow Up Recommendations  SNF    Equipment Recommendations  3 in 1 bedside commode    Recommendations for Other Services       Precautions / Restrictions Precautions Precautions: Back Precaution Booklet Issued: Yes (comment) Precaution Comments: provided handout Restrictions Weight Bearing Restrictions: No      Mobility Bed Mobility               General bed mobility comments: pt sitting EOB upon arrival  Transfers Overall transfer level: Needs assistance Equipment used: Rolling walker (2 wheeled) Transfers: Sit to/from Stand;Lateral/Scoot Transfers Sit to Stand: Total assist        Lateral/Scoot Transfers: Min assist General transfer comment: pt unable to progress into standing this session;required minA to lateral scoot to drop arm recliner    Balance Overall balance assessment: Needs assistance Sitting-balance support: No upper extremity supported;Feet supported Sitting balance-Leahy Scale: Fair     Standing balance support: Bilateral upper extremity supported;During functional  activity Standing balance-Leahy Scale: Zero Standing balance comment: unable to progress into standing attempted x2 with elevated surface                           ADL either performed or assessed with clinical judgement   ADL Overall ADL's : Needs assistance/impaired Eating/Feeding: Set up   Grooming: Set up;Sitting   Upper Body Bathing: Set up;Sitting Upper Body Bathing Details (indicate cue type and reason): pt completed UB bathing upon arrival Lower Body Bathing: Moderate assistance;Sitting/lateral leans   Upper Body Dressing : Minimal assistance;Sitting   Lower Body Dressing: Maximal assistance;Sitting/lateral leans     Toilet Transfer Details (indicate cue type and reason): pt transferred to recliner with +1 avialable today, lateral scoot to drop arm recliner Toileting- Clothing Manipulation and Hygiene: Maximal assistance;Sitting/lateral lean Toileting - Clothing Manipulation Details (indicate cue type and reason): clinical judgement     Functional mobility during ADLs: Maximal assistance;+2 for physical assistance;+2 for safety/equipment General ADL Comments: pt limited by decreased strength in BLE;generalized weakness;increased DoE 3/4 with attempt to stand     Vision         Perception     Praxis      Pertinent Vitals/Pain Pain Assessment: 0-10 Pain Score: 4  Pain Location: back surgical site Pain Descriptors / Indicators: Sore Pain Intervention(s): Limited activity within patient's tolerance;Monitored during session;Repositioned     Hand Dominance Right   Extremity/Trunk Assessment Upper Extremity Assessment Upper Extremity Assessment: Generalized weakness   Lower Extremity Assessment Lower Extremity Assessment: Generalized weakness;Defer to PT evaluation;LLE deficits/detail LLE Deficits / Details: LLE weaker than  RLE;unable to support weight in standing   Cervical / Trunk Assessment Cervical / Trunk Assessment: Other exceptions Cervical  / Trunk Exceptions: back precautions   Communication Communication Communication: No difficulties   Cognition Arousal/Alertness: Awake/alert Behavior During Therapy: WFL for tasks assessed/performed Overall Cognitive Status: Within Functional Limits for tasks assessed                                     General Comments  Pt on 2lnc throughout session, increased DoE with exertion about 3/4 SpO2 >95% throughout    Exercises     Shoulder Instructions      Home Living Family/patient expects to be discharged to:: Private residence Living Arrangements: Alone Available Help at Discharge: Family;Available PRN/intermittently Type of Home: House Home Access: Stairs to enter   Entrance Stairs-Rails: Can reach both Home Layout: One level     Bathroom Shower/Tub: Occupational psychologist: Standard Bathroom Accessibility: Yes How Accessible: Accessible via walker Home Equipment: Cane - single point   Additional Comments: pt on 2lnc at home prn       Prior Functioning/Environment Level of Independence: Independent with assistive device(s)        Comments: pt was independent with ADL, driving, shopping, IADL;he reports using cane at all times        OT Problem List: Decreased strength;Decreased activity tolerance;Decreased range of motion;Impaired balance (sitting and/or standing);Decreased safety awareness;Decreased knowledge of use of DME or AE;Decreased knowledge of precautions;Pain      OT Treatment/Interventions: Self-care/ADL training;Therapeutic exercise;Energy conservation;DME and/or AE instruction;Therapeutic activities;Patient/family education;Balance training    OT Goals(Current goals can be found in the care plan section) Acute Rehab OT Goals Patient Stated Goal: to get stronger OT Goal Formulation: With patient Time For Goal Achievement: 11/29/19 Potential to Achieve Goals: Good ADL Goals Pt Will Perform Grooming: with min guard  assist;standing Pt Will Perform Lower Body Dressing: with min assist;with adaptive equipment;sit to/from stand Pt Will Transfer to Toilet: with min assist;ambulating Pt Will Perform Toileting - Clothing Manipulation and hygiene: with min assist;sit to/from stand Additional ADL Goal #1: Pt will independently adhere to 3/3 precautions with ADL completion and functioanl mobility.  OT Frequency: Min 2X/week   Barriers to D/C: Decreased caregiver support  pt lives alone       Co-evaluation              AM-PAC OT "6 Clicks" Daily Activity     Outcome Measure Help from another person eating meals?: A Little Help from another person taking care of personal grooming?: A Little Help from another person toileting, which includes using toliet, bedpan, or urinal?: A Lot Help from another person bathing (including washing, rinsing, drying)?: A Little Help from another person to put on and taking off regular upper body clothing?: A Little Help from another person to put on and taking off regular lower body clothing?: A Lot 6 Click Score: 16   End of Session Equipment Utilized During Treatment: Gait belt;Rolling walker;Oxygen Nurse Communication: Mobility status;Need for lift equipment  Activity Tolerance: Patient tolerated treatment well;Patient limited by pain Patient left: in chair;with call bell/phone within reach;with chair alarm set  OT Visit Diagnosis: Unsteadiness on feet (R26.81);Other abnormalities of gait and mobility (R26.89);Muscle weakness (generalized) (M62.81);Pain Pain - part of body: (back incision site)                Time: 1610-9604 OT Time Calculation (  min): 26 min Charges:  OT General Charges $OT Visit: 1 Visit OT Evaluation $OT Eval Moderate Complexity: 1 Mod OT Treatments $Self Care/Home Management : 8-22 mins  Helene Kelp OTR/L Acute Rehabilitation Services Office: [phone]   Wyn Forster 11/15/2019, 12:44 PM

## 2019-11-16 ENCOUNTER — Encounter (HOSPITAL_COMMUNITY): Payer: Self-pay | Admitting: Neurosurgery

## 2019-11-16 LAB — GLUCOSE, CAPILLARY: Glucose-Capillary: 108 mg/dL — ABNORMAL HIGH (ref 70–99)

## 2019-11-16 NOTE — Plan of Care (Signed)
  Problem: Safety: Goal: Ability to remain free from injury will improve 11/16/2019 1650 by Melina Schools, RN Outcome: Progressing 11/16/2019 1125 by Melina Schools, RN Outcome: Progressing   Problem: Education: Goal: Ability to verbalize activity precautions or restrictions will improve 11/16/2019 1650 by Melina Schools, RN Outcome: Progressing 11/16/2019 1125 by Melina Schools, RN Outcome: Progressing Goal: Knowledge of the prescribed therapeutic regimen will improve 11/16/2019 1650 by Melina Schools, RN Outcome: Progressing 11/16/2019 1125 by Melina Schools, RN Outcome: Progressing Goal: Understanding of discharge needs will improve 11/16/2019 1650 by Melina Schools, RN Outcome: Progressing 11/16/2019 1125 by Melina Schools, RN Outcome: Progressing   Problem: Activity: Goal: Ability to avoid complications of mobility impairment will improve Outcome: Progressing Goal: Will remain free from falls Outcome: Progressing

## 2019-11-16 NOTE — Plan of Care (Signed)
  Problem: Activity: Goal: Ability to avoid complications of mobility impairment will improve Outcome: Progressing Goal: Ability to tolerate increased activity will improve Outcome: Progressing   Problem: Bowel/Gastric: Goal: Gastrointestinal status for postoperative course will improve Outcome: Progressing   Problem: Pain Management: Goal: Pain level will decrease Outcome: Progressing   Problem: Skin Integrity: Goal: Will show signs of wound healing Outcome: Progressing

## 2019-11-16 NOTE — Plan of Care (Signed)
  Problem: Safety: Goal: Ability to remain free from injury will improve Outcome: Progressing   Problem: Education: Goal: Ability to verbalize activity precautions or restrictions will improve Outcome: Progressing Goal: Knowledge of the prescribed therapeutic regimen will improve Outcome: Progressing Goal: Understanding of discharge needs will improve Outcome: Progressing   Problem: Activity: Goal: Will remain free from falls Outcome: Progressing

## 2019-11-16 NOTE — Progress Notes (Signed)
Physical Therapy Treatment Patient Details Name: Colton Nguyen MRN: [id number] DOB: 12-05-1945 Today's Date: 11/16/2019    History of Present Illness Pt is a 74 y/o male s/p L3-L5 PLIF. PMH including but not limited to CAD, CHF, COPD and HTN.    PT Comments    Pt is making steady progress with functional mobility with bed mobility, transfers and ambulation requiring 1+ PT assistance. He was able to follow verbal cue's with min guard for proper sequencing of logroll bed mobility. He required min A to move B LE and trunk to sit EOB. He required min A and elevated bed for sit to stand with RW and cues for proper hand placement on RW. He was able to ambulate ~ 89ft with min A and step-through gait pattern. He was on 2L via [place] throughout session and his SpO2 remained between 90-93%. Pt's HR was 101 bpm at EOB and 114 bpm after ambulation. Pt would continue to benefit from skilled physical therapy services at this time while admitted and after d/c to address the below listed limitations in order to improve overall safety and independence with functional mobility.   Follow Up Recommendations  SNF     Equipment Recommendations  None recommended by PT    Recommendations for Other Services       Precautions / Restrictions Precautions Precautions: Fall;Back Precaution Comments: Reviwed handout Restrictions Weight Bearing Restrictions: No    Mobility  Bed Mobility Overal bed mobility: Needs Assistance Bed Mobility: Rolling;Sidelying to Sit Rolling: Min guard Sidelying to sit: Min assist       General bed mobility comments: use of bed rail, cueing for log roll technique, assistance needed to move bilateral LEs off of bed and for trunk elevation  Transfers Overall transfer level: Needs assistance Equipment used: Rolling walker (2 wheeled) Transfers: Sit to/from Stand Sit to Stand: Min assist;From elevated surface         General transfer comment: bed elevated, cueing for  appropraite hand positioning, min A for power into standing from EOB  Ambulation/Gait Ambulation/Gait assistance: Min guard Gait Distance (Feet): 10 Feet Assistive device: Rolling walker (2 wheeled) Gait Pattern/deviations: Step-through pattern;Decreased stride length Gait velocity: decreased   General Gait Details: Min guard for safety and handeling RW.    Stairs             Wheelchair Mobility    Modified Rankin (Stroke Patients Only)       Balance Overall balance assessment: Needs assistance Sitting-balance support: No upper extremity supported;Feet supported Sitting balance-Leahy Scale: Fair     Standing balance support: Bilateral upper extremity supported;During functional activity Standing balance-Leahy Scale: Fair                              Cognition Arousal/Alertness: Awake/alert Behavior During Therapy: WFL for tasks assessed/performed Overall Cognitive Status: Within Functional Limits for tasks assessed                                        Exercises      General Comments General comments (skin integrity, edema, etc.): Pt on 2L O2 throughout session. SpO2 remained 90-93%. HR was 101 EOB and 114 post-ambulation.       Pertinent Vitals/Pain Pain Assessment: Faces Faces Pain Scale: Hurts a little bit Pain Location: R LE, back Pain Descriptors / Indicators: Spasm;Restless Pain Intervention(s):  Monitored during session;Repositioned    Home Living                      Prior Function            PT Goals (current goals can now be found in the care plan section) Acute Rehab PT Goals Patient Stated Goal: to get stronger PT Goal Formulation: With patient Time For Goal Achievement: 11/29/19 Potential to Achieve Goals: Good Progress towards PT goals: Progressing toward goals    Frequency    Min 5X/week      PT Plan Current plan remains appropriate    Co-evaluation              AM-PAC PT "6  Clicks" Mobility   Outcome Measure  Help needed turning from your back to your side while in a flat bed without using bedrails?: A Little Help needed moving from lying on your back to sitting on the side of a flat bed without using bedrails?: A Little Help needed moving to and from a bed to a chair (including a wheelchair)?: A Little Help needed standing up from a chair using your arms (e.g., wheelchair or bedside chair)?: A Little Help needed to walk in hospital room?: A Little Help needed climbing 3-5 steps with a railing? : A Lot 6 Click Score: 17    End of Session Equipment Utilized During Treatment: Gait belt Activity Tolerance: Patient tolerated treatment well Patient left: in chair;with call bell/phone within reach;with chair alarm set Nurse Communication: Mobility status PT Visit Diagnosis: Other abnormalities of gait and mobility (R26.89)     Time: 1117-3567 PT Time Calculation (min) (ACUTE ONLY): 18 min  Charges:  $Gait Training: 8-22 mins                     Colton Nguyen, SPT Acute Rehab  [phone]   Colton Nguyen 11/16/2019, 1:07 PM

## 2019-11-16 NOTE — NC FL2 (Signed)
[place] LEVEL OF CARE SCREENING TOOL     IDENTIFICATION  Patient Name: Colton Nguyen Birthdate: 1945/12/27 Sex: male Admission Date (Current Location): 11/14/2019  [facility] and [place] Number:  Engineering geologist and Address:  [facility] [place] [address], [place], [place] [address]      Provider Number: [id number]  Attending Physician Name and Address:  Ashok Pall, MD  Relative Name and Phone Number:  Jilda Panda, sister - [phone]    Current Level of Care: Hospital Recommended Level of Care: [place] Prior Approval Number:    Date Approved/Denied:   PASRR Number: [id number]  Discharge Plan: SNF    Current Diagnoses: Patient Active Problem List   Diagnosis Date Noted  . Lumbar stenosis with neurogenic claudication 11/14/2019  . BPH (benign prostatic hyperplasia) 08/01/2018  . COPD (chronic obstructive pulmonary disease) ([place]) 02/06/2018  . Hypertension 12/28/2016  . Exacerbation of Emphysema/COPD ([place]) 12/28/2016  . Encounter for antineoplastic chemotherapy 06/03/2015  . Smoking trying to quit 04/01/2015  . Squamous cell carcinoma of lung, stage III 03/07/2015  . spiculated right upper lobe mass 02/14/2015    Orientation RESPIRATION BLADDER Height & Weight     Self, Time, Situation  O2 Continent Weight: 91.2 kg Height:  5\' 9"  (175.3 cm)  BEHAVIORAL SYMPTOMS/MOOD NEUROLOGICAL BOWEL NUTRITION STATUS      Continent Diet(see discharge summary)  AMBULATORY STATUS COMMUNICATION OF NEEDS Skin   Limited Assist Verbally Surgical wounds                       Personal Care Assistance Level of Assistance  Dressing, Bathing Bathing Assistance: Limited assistance   Dressing Assistance: Limited assistance     Functional Limitations Info             SPECIAL CARE FACTORS FREQUENCY  PT (By licensed PT), OT (By licensed OT)     PT Frequency: 5 times per week OT Frequency: 5 times per  week            Contractures Contractures Info: Not present    Additional Factors Info  Code Status Code Status Info: Full code             Current Medications (11/16/2019):  This is the current hospital active medication list Current Facility-Administered Medications  Medication Dose Route Frequency Provider Last Rate Last Admin  . 0.9 %  sodium chloride infusion  250 mL Intravenous Continuous Ashok Pall, MD      . 0.9 % NaCl with KCl 20 mEq/ L  infusion   Intravenous Continuous Ashok Pall, MD      . acetaminophen (TYLENOL) tablet 650 mg  650 mg Oral Q4H PRN Ashok Pall, MD   650 mg at 11/16/19 0456   Or  . acetaminophen (TYLENOL) suppository 650 mg  650 mg Rectal Q4H PRN Ashok Pall, MD      . albuterol (PROVENTIL) (2.5 MG/3ML) 0.083% nebulizer solution 2.5 mg  2.5 mg Inhalation Q6H PRN Ashok Pall, MD      . amLODipine (NORVASC) tablet 10 mg  10 mg Oral Daily Ashok Pall, MD   10 mg at 11/16/19 0905  . aspirin chewable tablet 81 mg  81 mg Oral QPC supper Ashok Pall, MD   81 mg at 11/15/19 1745  . atorvastatin (LIPITOR) tablet 40 mg  40 mg Oral Daily Ashok Pall, MD   40 mg at 11/16/19 0905  . bisacodyl (DULCOLAX) EC tablet 5  mg  5 mg Oral Daily PRN Ashok Pall, MD   5 mg at 11/15/19 2221  . Chlorhexidine Gluconate Cloth 2 % PADS 6 each  6 each Topical Daily Ashok Pall, MD   6 each at 11/14/19 2137  . cholecalciferol (VITAMIN D3) tablet 5,000 Units  5,000 Units Oral Daily Ashok Pall, MD   5,000 Units at 11/16/19 0904  . diazepam (VALIUM) tablet 5 mg  5 mg Oral Q6H PRN Ashok Pall, MD   5 mg at 11/15/19 2221  . finasteride (PROSCAR) tablet 5 mg  5 mg Oral QPC supper Ashok Pall, MD   5 mg at 11/15/19 1745  . fluticasone (FLONASE) 50 MCG/ACT nasal spray 2 spray  2 spray Each Nare Daily PRN Ashok Pall, MD      . losartan (COZAAR) tablet 100 mg  100 mg Oral Daily Ashok Pall, MD   100 mg at 11/16/19 8841   And  . hydrochlorothiazide (MICROZIDE)  capsule 12.5 mg  12.5 mg Oral Daily Ashok Pall, MD   12.5 mg at 11/16/19 0905  . LORazepam (ATIVAN) tablet 0.5 mg  0.5 mg Oral Daily PRN Ashok Pall, MD      . magnesium citrate solution 1 Bottle  1 Bottle Oral Once PRN Ashok Pall, MD      . menthol-cetylpyridinium (CEPACOL) lozenge 3 mg  1 lozenge Oral PRN Ashok Pall, MD       Or  . phenol (CHLORASEPTIC) mouth spray 1 spray  1 spray Mouth/Throat PRN Ashok Pall, MD      . mometasone-formoterol (DULERA) 200-5 MCG/ACT inhaler 2 puff  2 puff Inhalation BID Ashok Pall, MD   2 puff at 11/16/19 0831  . morphine 2 MG/ML injection 2 mg  2 mg Intravenous Q2H PRN Ashok Pall, MD      . ondansetron (ZOFRAN) tablet 4 mg  4 mg Oral Q6H PRN Ashok Pall, MD       Or  . ondansetron (ZOFRAN) injection 4 mg  4 mg Intravenous Q6H PRN Ashok Pall, MD      . oxyCODONE (Oxy IR/ROXICODONE) immediate release tablet 10 mg  10 mg Oral Q3H PRN Ashok Pall, MD      . oxyCODONE (Oxy IR/ROXICODONE) immediate release tablet 5 mg  5 mg Oral Q3H PRN Ashok Pall, MD   5 mg at 11/16/19 1303  . oxyCODONE (OXYCONTIN) 12 hr tablet 10 mg  10 mg Oral Q12H Ashok Pall, MD   10 mg at 11/16/19 0904  . senna (SENOKOT) tablet 8.6 mg  1 tablet Oral BID Ashok Pall, MD   8.6 mg at 11/16/19 0905  . senna-docusate (Senokot-S) tablet 1 tablet  1 tablet Oral QHS PRN Ashok Pall, MD   1 tablet at 11/15/19 2221  . sodium chloride flush (NS) 0.9 % injection 3 mL  3 mL Intravenous Q12H Ashok Pall, MD   3 mL at 11/16/19 0910  . sodium chloride flush (NS) 0.9 % injection 3 mL  3 mL Intravenous PRN Ashok Pall, MD      . tamsulosin (FLOMAX) capsule 0.4 mg  0.4 mg Oral QPC supper Ashok Pall, MD   0.4 mg at 11/15/19 1745  . umeclidinium bromide (INCRUSE ELLIPTA) 62.5 MCG/INH 1 puff  1 puff Inhalation Daily Ashok Pall, MD   1 puff at 11/16/19 0831     Discharge Medications: Please see discharge summary for a list of discharge medications.  Relevant Imaging  Results:  Relevant Lab Results:   Additional Information SS# [id number]  Curlene Labrum, RN

## 2019-11-16 NOTE — TOC Initial Note (Signed)
Transition of Care [facility]) - Initial/Assessment Note    Patient Details  Name: Colton Nguyen MRN: [id number] Date of Birth: 1946/02/21  [facility]) CM/SW Contact:    [place], RNCM Phone Number:  11/16/2019, 4:26 PM  Clinical Narrative:      Colton Nguyen  74 y.o. 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  Lumbar stenosis with neurogenic claudication L3-5 done on 11/14/2019 by Dr. Christella Noa.  Patient lives alone in a single home with a few steps to enter the home with family (sister, brother, and cousin) that live nearby.  Patient reports that he is tired and sore after surgery.  Patient given Medicare Choice for SNF as recommended by OT.  Patient gave options for several SNF facilities that he prefers to have rehab including [facility], [facility], [facility], and [facility].  Patient currently has a cane for mobility.  Since patient has been recommended for SNF placement - I will not order DME supplies for now.  Patient sees Dr. Humphrey Rolls as his PCP.  He gets his prescriptions filled at the [facility] .  No other barriers to discharge noted at this point.  Will place FL2, wait for bed placement with SNF and clearance from hospitalist and surgery.   Expected Discharge Plan: Skilled Nursing Facility Barriers to Discharge: No Barriers Identified   Patient Goals and CMS Choice Patient states their goals for this hospitalization and ongoing recovery are:: "I'm just tired and ready to feel better." CMS Medicare.gov Compare Post Acute Care list provided to:: Patient Choice offered to / list presented to : Patient  Expected Discharge Plan and Services Expected Discharge Plan: [place]   Discharge Planning Services: CM Consult   Living arrangements for the past 2 months: Single Family Home                                      Prior Living Arrangements/Services Living arrangements for the past 2  months: Single Family Home Lives with:: Self Patient language and need for interpreter reviewed:: Yes Do you feel safe going back to the place where you live?: Yes      Need for Family Participation in Patient Care: Yes (Comment) Care giver support system in place?: Yes (comment)   Criminal Activity/Legal Involvement Pertinent to Current Situation/Hospitalization: No - Comment as needed  Activities of Daily Living Home Assistive Devices/Equipment: Wheelchair, Environmental consultant (specify type), Cane (specify quad or straight), Blood pressure cuff ADL Screening (condition at time of admission) Patient's cognitive ability adequate to safely complete daily activities?: Yes Is the patient deaf or have difficulty hearing?: No Does the patient have difficulty seeing, even when wearing glasses/contacts?: No Does the patient have difficulty concentrating, remembering, or making decisions?: No Patient able to express need for assistance with ADLs?: Yes Does the patient have difficulty dressing or bathing?: No Independently performs ADLs?: Yes (appropriate for developmental age) Does the patient have difficulty walking or climbing stairs?: Yes Weakness of Legs: Both Weakness of Arms/Hands: None  Permission Sought/Granted Permission sought to share information with : Case Manager, Customer service manager, PCP, Family Supports Permission granted to share information with : Yes, Verbal Permission Granted     Permission granted to share info w AGENCY: SNF of choice  Permission granted to share info w Relationship: family contacts     Emotional Assessment Appearance:: Appears stated age, Well-Groomed  Attitude/Demeanor/Rapport: Gracious Affect (typically observed): Accepting, Quiet Orientation: : Oriented to Self, Oriented to Place, Oriented to  Time, Oriented to Situation Alcohol / Substance Use: Not Applicable Psych Involvement: No (comment)  Admission diagnosis:  Lumbar stenosis with neurogenic  claudication [M48.062] Patient Active Problem List   Diagnosis Date Noted  . Lumbar stenosis with neurogenic claudication 11/14/2019  . BPH (benign prostatic hyperplasia) 08/01/2018  . COPD (chronic obstructive pulmonary disease) ([place]) 02/06/2018  . Hypertension 12/28/2016  . Exacerbation of Emphysema/COPD ([place]) 12/28/2016  . Encounter for antineoplastic chemotherapy 06/03/2015  . Smoking trying to quit 04/01/2015  . Squamous cell carcinoma of lung, stage III 03/07/2015  . spiculated right upper lobe mass 02/14/2015   PCP:  [name], MD Pharmacy:   [facility] [place] [address] Phone: [phone] Fax: [phone]     Social Determinants of Health (SDOH) Interventions    Readmission Risk Interventions Readmission Risk Prevention Plan 11/16/2019  Transportation Screening Complete  PCP or Specialist Appt within 5-7 Days Complete  Home Care Screening Complete  Medication Review (RN CM) Complete  Some recent data might be hidden

## 2019-11-16 NOTE — Progress Notes (Signed)
PT Progress Note for Charges    11/16/19 1300  PT Visit Information  Last PT Received On 11/16/19  PT General Charges  $$ ACUTE PT VISIT 1 Visit  PT Treatments  $Gait Training 8-22 mins  Anastasio Champion, DPT  Acute Rehabilitation Services Pager [phone] Office [phone]

## 2019-11-17 LAB — GLUCOSE, CAPILLARY
Glucose-Capillary: 105 mg/dL — ABNORMAL HIGH (ref 70–99)
Glucose-Capillary: 137 mg/dL — ABNORMAL HIGH (ref 70–99)

## 2019-11-17 MED ORDER — OXYCODONE HCL 5 MG PO TABS: 5.0000 mg | ORAL_TABLET | Freq: Four times a day (QID) | ORAL | 0 refills | Status: DC | PRN

## 2019-11-17 NOTE — Discharge Summary (Addendum)
Physician Discharge Summary  Patient ID: Colton Nguyen MRN: [id number] DOB/AGE: 17-Jul-1946 74 y.o.  Admit date: 11/14/2019 Discharge date: 11/17/2019, 10/21/2019  Admission Diagnoses:Lumbar stenosis with neurogenic claudication  Discharge Diagnoses:  Active Problems:   Lumbar stenosis with neurogenic claudication   Discharged Condition: good  Hospital Course: Colton Nguyen was admitted and taken to the operating room for an uncomplicated lumbar decompression and arthrodesis with posterior pedicle screw fixation. At discharge his wound is clean, dry, and without signs of infection. He is ambulating with assistance, voiding, and tolerating a regular diet.   Treatments: surgery: *Lumbar Three-Four Lumbar Four-Five Posterior lumbar interbody arthrodesis 11x27x9mm x4(Synthes Conduit cages) autograft morsels Lumbar laminectomy L3, L4 in excess of the needed exposure for a plif Segmental posterior pedicle screw fixation L3-L5(nuvasive Masplif)  Discharge Exam: Blood pressure (!) 100/54, pulse 83, temperature 99 F (37.2 C), temperature source Oral, resp. rate 15, height 5\' 9"  (1.753 m), weight 91.2 kg, SpO2 97 %. General appearance: alert, cooperative, appears stated age, fatigued and mild distress Neurologic: Alert and oriented X 3, normal strength and tone. Normal symmetric reflexes. Normal coordination and gait There is no change in the exam.  Disposition: Discharge disposition: 01-Home or Self Care      Lumbar stenosis with neurogenic claudication  Allergies as of 11/17/2019   No Known Allergies     Medication List    TAKE these medications   acetaminophen 500 MG tablet Commonly known as: TYLENOL Take 1,000 mg by mouth every 6 (six) hours as needed for moderate pain or headache.   albuterol 108 (90 Base) MCG/ACT inhaler Commonly known as: VENTOLIN HFA Inhale 1 puff into the lungs every 6 (six) hours as needed for wheezing or shortness of breath.   amLODipine 10 MG  tablet Commonly known as: NORVASC Take 10 mg by mouth daily.   aspirin 81 MG tablet Take 81 mg by mouth daily.   atorvastatin 40 MG tablet Commonly known as: LIPITOR Take 40 mg by mouth daily.   finasteride 5 MG tablet Commonly known as: PROSCAR Take 1 tablet (5 mg total) by mouth daily.   fluticasone 50 MCG/ACT nasal spray Commonly known as: FLONASE Place 2 sprays into both nostrils daily as needed for allergies.   HYDROcodone-acetaminophen 5-325 MG tablet Commonly known as: NORCO/VICODIN Take 1 tablet by mouth every 6 (six) hours as needed for moderate pain.   Lidocaine 4 % Ptch Apply 1 patch topically at bedtime.   lidocaine 5 % Commonly known as: Lidoderm Place 1 patch onto the skin every 12 (twelve) hours. Remove & Discard patch within 12 hours or as directed by MD   LORazepam 0.5 MG tablet Commonly known as: ATIVAN Take 0.5 mg by mouth daily as needed for anxiety.   losartan-hydrochlorothiazide 100-12.5 MG tablet Commonly known as: HYZAAR Take 1 tablet by mouth daily.   methocarbamol 500 MG tablet Commonly known as: ROBAXIN Take 1,000 mg by mouth every 6 (six) hours as needed for muscle spasms.   oxyCODONE 5 MG immediate release tablet Commonly known as: Oxy IR/ROXICODONE Take 1 tablet (5 mg total) by mouth every 6 (six) hours as needed for moderate pain or severe pain ((score 4 to 6)).   Symbicort 160-4.5 MCG/ACT inhaler Generic drug: budesonide-formoterol Inhale 2 puffs into the lungs 2 (two) times daily.   tamsulosin 0.4 MG Caps capsule Commonly known as: FLOMAX Take 1 capsule (0.4 mg total) by mouth daily.   Spiriva Respimat 2.5 MCG/ACT Aers Generic drug: Tiotropium Bromide Monohydrate Inhale 1  puff into the lungs daily.   tiotropium 18 MCG inhalation capsule Commonly known as: Spiriva HandiHaler Place 1 capsule (18 mcg total) into inhaler and inhale daily.   Vitamin D3 125 MCG (5000 UT) Tabs Take 5,000 Units by mouth daily.         SignedAshok Pall 11/17/2019, 6:04 PM

## 2019-11-17 NOTE — Progress Notes (Signed)
PT Progress Note for Charges    11/17/19 1000  PT Visit Information  Last PT Received On 11/17/19  PT General Charges  $$ ACUTE PT VISIT 1 Visit  PT Treatments  $Gait Training 8-22 mins  Anastasio Champion, DPT  Acute Rehabilitation Services Pager [phone] Office [phone]

## 2019-11-17 NOTE — Progress Notes (Signed)
Patient ID: Colton Nguyen, male   DOB: Jul 21, 1946, 74 y.o.   MRN: [id number] BP (!) 100/54 (BP Location: Right Arm)   Pulse 83   Temp 99 F (37.2 C) (Oral)   Resp 15   Ht 5\' 9"  (1.753 m)   Wt 91.2 kg   SpO2 97%   BMI 29.68 kg/m  Alert and oriented x4, has chosen to go to rehab facility Wound is clean, and dry.  Will be discharged tomorrow.

## 2019-11-17 NOTE — Progress Notes (Signed)
Spoke with Dr Lacy Duverney PA about needing a new order for a repeat Covid test.

## 2019-11-17 NOTE — Progress Notes (Signed)
Physical Therapy Treatment Patient Details Name: Jaquise Nguyen MRN: [id number] DOB: 05-Apr-1946 Today's Date: 11/17/2019    History of Present Illness Pt is a 74 y/o male s/p L3-L5 PLIF. PMH including but not limited to CAD, CHF, COPD and HTN.    PT Comments    Pt continues to make progress with functional mobility as demonstrated by pt's ability to sit EOB mod I. He was able to transfer sit to stand from a lower bed height and proper sequencing and safety with the RW. He was able to ambulate ~176ft with min assist, RW and chair follow, he required 1 standing rest break and 1 seated rest break. Pt was on 2L via [place], SpO2 remained at 93% and HR inc from 101 (EOB) to 114 during and after ambulation. Pt would continue to benefit from skilled physical therapy services at this time while admitted and after d/c to address the below listed limitations in order to improve overall safety and independence with functional mobility.   Follow Up Recommendations  SNF     Equipment Recommendations  Rolling walker with 5" wheels    Recommendations for Other Services       Precautions / Restrictions Precautions Precautions: Fall;Back Restrictions Weight Bearing Restrictions: No    Mobility  Bed Mobility Overal bed mobility: Needs Assistance             General bed mobility comments: Pt was EOB upon arrival, therefore cannot comment on ability to maintain back precautions or bed mobility status  Transfers Overall transfer level: Needs assistance Equipment used: Rolling walker (2 wheeled) Transfers: Sit to/from Stand Sit to Stand: Min guard         General transfer comment: Pt able to transfer from lower bed height with min guard and appropriate hand placement and safety w/ RW  Ambulation/Gait Ambulation/Gait assistance: Min assist Gait Distance (Feet): 100 Feet Assistive device: Rolling walker (2 wheeled) Gait Pattern/deviations: Step-through pattern;Decreased stride  length Gait velocity: decreased   General Gait Details: Min assist and chair follow for safety. Pt required 1 standing rest break and 1 seated rest break halfway. Pt on 2L O2 throughout, SpO2 remaind at 93% and HR at 114 bpm.   Stairs             Wheelchair Mobility    Modified Rankin (Stroke Patients Only)       Balance Overall balance assessment: Needs assistance Sitting-balance support: No upper extremity supported;Feet supported Sitting balance-Leahy Scale: Good     Standing balance support: Bilateral upper extremity supported;During functional activity Standing balance-Leahy Scale: Good                              Cognition Arousal/Alertness: Awake/alert Behavior During Therapy: WFL for tasks assessed/performed Overall Cognitive Status: Within Functional Limits for tasks assessed                                        Exercises      General Comments General comments (skin integrity, edema, etc.): Pt on 2L O2 throughout session. SpO2 remained at 93%. HR at EOB was 101, 114bpm while ambulating and 114bpm post-ambulation.      Pertinent Vitals/Pain Pain Assessment: Faces Faces Pain Scale: Hurts a little bit Pain Location: R LE, back Pain Descriptors / Indicators: Spasm;Restless Pain Intervention(s): Monitored during session;Repositioned    Home  Living                      Prior Function            PT Goals (current goals can now be found in the care plan section) Acute Rehab PT Goals PT Goal Formulation: With patient Time For Goal Achievement: 11/29/19 Potential to Achieve Goals: Good Progress towards PT goals: Progressing toward goals    Frequency    Min 5X/week      PT Plan Current plan remains appropriate    Co-evaluation              AM-PAC PT "6 Clicks" Mobility   Outcome Measure  Help needed turning from your back to your side while in a flat bed without using bedrails?: A Little Help  needed moving from lying on your back to sitting on the side of a flat bed without using bedrails?: A Little Help needed moving to and from a bed to a chair (including a wheelchair)?: A Little Help needed standing up from a chair using your arms (e.g., wheelchair or bedside chair)?: A Little Help needed to walk in hospital room?: A Little Help needed climbing 3-5 steps with a railing? : A Lot 6 Click Score: 17    End of Session Equipment Utilized During Treatment: Gait belt Activity Tolerance: Patient tolerated treatment well Patient left: in chair;with call bell/phone within reach;with chair alarm set Nurse Communication: Mobility status PT Visit Diagnosis: Other abnormalities of gait and mobility (R26.89)     Time: 0840-0900 PT Time Calculation (min) (ACUTE ONLY): 20 min  Charges:  $Gait Training: 8-22 mins                     Jorja Empie, SPT Acute Rehab  [phone]    Hendrixx Severin 11/17/2019, 10:51 AM

## 2019-11-17 NOTE — TOC Transition Note (Signed)
Transition of Care [facility]) - CM/SW Discharge Note   Patient Details  Name: [name] MRN: [id number] Date of Birth: [birth year]  Transition of Care [facility]) CM/SW Contact:  Curlene Labrum, RN Phone Number: 11/17/2019, 3:36 PM   Clinical Narrative:     Case Management met with the patient and patient selected to discharge to [facility].  Talked with [facility], and the facility requested COVID screen prior to discharge.  Paged Dr. Christella Noa at [phone] and discharge summary and discharge to be home to be placed.  Patient to be discharged tomorrow and bed will be ready at [facility].  The patient will need to be transported by [facility] to the facility for discharge.    Final next level of care: Skilled Nursing Facility Barriers to Discharge: No Barriers Identified   Patient Goals and CMS Choice Patient states their goals for this hospitalization and ongoing recovery are:: "I'm just tired and ready to feel better." CMS Medicare.gov Compare Post Acute Care list provided to:: Patient Choice offered to / list presented to : Patient  Discharge Placement                       Discharge Plan and Services   Discharge Planning Services: CM Consult                                 Social Determinants of Health (SDOH) Interventions     Readmission Risk Interventions Readmission Risk Prevention Plan 11/16/2019  Transportation Screening Complete  PCP or Specialist Appt within 5-7 Days Complete  Home Care Screening Complete  Medication Review (RN CM) Complete  Some recent data might be hidden

## 2019-11-18 LAB — GLUCOSE, CAPILLARY: Glucose-Capillary: 112 mg/dL — ABNORMAL HIGH (ref 70–99)

## 2019-11-18 LAB — SARS CORONAVIRUS 2 (TAT 6-24 HRS): SARS Coronavirus 2: NEGATIVE

## 2019-11-18 MED ORDER — OXYCODONE HCL 5 MG PO TABS: 5.0000 mg | ORAL_TABLET | Freq: Four times a day (QID) | ORAL | 0 refills | Status: AC | PRN

## 2019-11-18 NOTE — TOC Transition Note (Signed)
Transition of Care [facility]) - CM/SW Discharge Note   Patient Details  Name: Colton Nguyen MRN: [id number] Date of Birth: 09-24-1945  [facility]) CM/SW Contact:  Bary Castilla, LCSW Phone Number: [phone] 11/18/2019, 12:15 PM   Clinical Narrative:    Patient will DC to:?[facility] Anticipated DC date:?11/18/19 Family notified:?Audrey Transport by: Corey Harold   Per MD patient ready for DC to [facility]. RN, patient, patient's family, and facility notified of DC. Discharge Summary sent to facility. RN given number for report [place]. DC packet on chart. Ambulance transport requested for patient.   CSW signing off.   [place], [place] [phone]    Final next level of care: Skilled Nursing Facility Barriers to Discharge: No Barriers Identified   Patient Goals and CMS Choice Patient states their goals for this hospitalization and ongoing recovery are:: "I'm just tired and ready to feel better." CMS Medicare.gov Compare Post Acute Care list provided to:: Patient Choice offered to / list presented to : Patient  Discharge Placement              Patient chooses bed at: [facility] Patient to be transferred to facility by: [place] Name of family member notified: Luellen Pucker Patient and family notified of of transfer: 11/18/19  Discharge Plan and Services   Discharge Planning Services: CM Consult                                 Social Determinants of Health ([place]) Interventions     Readmission Risk Interventions Readmission Risk Prevention Plan 11/16/2019  Transportation Screening Complete  PCP or Specialist Appt within 5-7 Days Complete  Home Care Screening Complete  Medication Review (RN CM) Complete  Some recent data might be hidden

## 2019-11-18 NOTE — Plan of Care (Signed)
  Problem: Safety: Goal: Ability to remain free from injury will improve Outcome: Progressing   Problem: Education: Goal: Ability to verbalize activity precautions or restrictions will improve Outcome: Progressing   Problem: Activity: Goal: Ability to tolerate increased activity will improve Outcome: Progressing   Problem: Activity: Goal: Will remain free from falls Outcome: Progressing

## 2019-12-05 ENCOUNTER — Encounter: Payer: Self-pay | Admitting: Gastroenterology

## 2019-12-21 DIAGNOSIS — K5903 Drug induced constipation: Secondary | ICD-10-CM | POA: Insufficient documentation

## 2019-12-21 DIAGNOSIS — D509 Iron deficiency anemia, unspecified: Secondary | ICD-10-CM | POA: Insufficient documentation

## 2019-12-21 DIAGNOSIS — F17200 Nicotine dependence, unspecified, uncomplicated: Secondary | ICD-10-CM

## 2019-12-21 DIAGNOSIS — R41841 Cognitive communication deficit: Secondary | ICD-10-CM

## 2019-12-21 DIAGNOSIS — F419 Anxiety disorder, unspecified: Secondary | ICD-10-CM | POA: Insufficient documentation

## 2019-12-22 ENCOUNTER — Ambulatory Visit (INDEPENDENT_AMBULATORY_CARE_PROVIDER_SITE_OTHER): Payer: Medicare Other | Admitting: Nurse Practitioner

## 2019-12-22 ENCOUNTER — Encounter: Payer: Self-pay | Admitting: Nurse Practitioner

## 2019-12-22 VITALS — BP 124/62 | HR 98 | Temp 97.7°F | Ht 69.5 in | Wt 191.0 lb

## 2019-12-22 DIAGNOSIS — R195 Other fecal abnormalities: Secondary | ICD-10-CM | POA: Diagnosis not present

## 2019-12-22 DIAGNOSIS — D509 Iron deficiency anemia, unspecified: Secondary | ICD-10-CM

## 2019-12-22 MED ORDER — NA SULFATE-K SULFATE-MG SULF 17.5-3.13-1.6 GM/177ML PO SOLN: 1.0000 | Freq: Once | ORAL | 0 refills | Status: AC

## 2019-12-22 MED ORDER — OMEPRAZOLE 40 MG PO CPDR: 40.0000 mg | DELAYED_RELEASE_CAPSULE | Freq: Every day | ORAL | 3 refills | Status: DC

## 2019-12-22 NOTE — Patient Instructions (Addendum)
If you are age 73 or older, your body mass index should be between 23-30. Your Body mass index is 27.8 kg/m. If this is out of the aforementioned range listed, please consider follow up with your Primary Care Provider.  If you are age 45 or younger, your body mass index should be between 19-25. Your Body mass index is 27.8 kg/m. If this is out of the aformentioned range listed, please consider follow up with your Primary Care Provider.   You have been scheduled for a colonoscopy. Please follow written instructions given to you at your visit today.  Please pick up your prep supplies at the pharmacy within the next 1-3 days. If you use inhalers (even only as needed), please bring them with you on the day of your procedure.  HOLD your Iron starting 10 days prior to your colonoscopy.  START Omeprazole 40 mg 1 capsule every morning.

## 2019-12-22 NOTE — Progress Notes (Signed)
ASSESSMENT / PLAN:   74 year old male with PMH significant for hypertension, lung cancer, COPD with ongoing tobacco use, hyperlipidemia, and anxiety.  # Heme positive stool / microcytic anemia.  --Baseline hgb around 14. In mid March PRIOR to back surgery is was 12.4. Now since back surgery it has declined to 10.1. I didn't get latest labs from [facility] until after patient had left the office but his MCV is low.  Suspect iron deficiency. --Black stools for a couple of days last month at rehab facility ( prior to starting iron). Stools back to normal color, even on oral iron --Suspect worsening anemia due in part to recent back surgery as well as GI bleed.  --Given recent NSAID use will start PPI empirically --No NSAIDs until GI work-up complete.  A daily baby aspirin is okay if necessary. --Last colonoscopy was 10 years ago in [place] ---Rule out erosive disease, PUD, AVMs, neoplasm, other. For further evaluation patient will be scheduled for EGD and colonoscopy.  Procedure will need to be done at the hospital since patient does require oxygen as needed.  The risks and benefits of EGD and colonoscopy with possible polypectomy / biopsies were discussed and the patient agrees to proceed.    HPI:     Chief Complaint:  None. Recently found to be anemic   History comes from chart and patient  Colton Nguyen is a 74 year old male referred by Dr. Aubery Lapping for evaluation of anemia and Hemoccult positive stool.  Hemoglobin this time last year was 13.9. Patient had back surgery on 3/12/ it was 12.4 on 11/14/2019 just prior to back surgery.  Patient had back surgery on 11/17/2019 and from there went to [facility] and rehabilitation.  While there he was diagnosed with anemia and Hemoccult positive stool.  I did not receive any labs with the packet of records.  We called to have those labs sent over and they were received after patient left the office.  Hemoglobin on  12/11/2019 was 10.1, MCV 79, platelets 206.  Patient says that prior to back surgery his bowel movements were normal at home.  At Rehab he developed constipation which resolved with miralax. He noticed that stools were black, this lasted a couple of days. Stool apparently tested positive for blood on two occasions while at Rehab. With the 2 gram drop in hgb since hospital discharge patient was recently started on iron.  Of note, patient was taking ibuprofen on a daily basis leading up to back surgery.  He denies history of peptic ulcer disease.  Patient says he is due for a colonoscopy, last one was in [place] 10 years ago.   Patient's back rehab is complete. He is back home now and doing well. Bowel movements normal. No further black stools.    Past Medical History:  Diagnosis Date  . Anxiety    situational  . Arthritis   . BPH (benign prostatic hyperplasia)   . CAD (coronary artery disease)   . CHF (congestive heart failure) ([place])    pt denies 11/13/2019  . Chronic cardiopulmonary disease ([place])   . COPD (chronic obstructive pulmonary disease) ([place])   . History of kidney stones   . Hyperlipidemia   . Hypertension   . Hypertension 12/28/2016  . lung ca dx'd 03/2015  . Mass of lung   . Nicotine dependence   . Personal history of kidney stones   . Restless leg   .  Shortness of breath dyspnea    with exertion  . Tuberculosis   . Wears glasses      Past Surgical History:  Procedure Laterality Date  . BACK SURGERY    . CAD CCTA 01/31/15    . CERVICAL FUSION    . COLONOSCOPY    . FRACTURE SURGERY     left ankle  . LUNG BIOPSY N/A 02/27/2015   Procedure: LUNG BIOPSY;  Surgeon: Grace Isaac, MD;  Location: [facility];  Service: Thoracic;  Laterality: N/A;  . right inguinal hernia repair at age 74    . VIDEO BRONCHOSCOPY WITH ENDOBRONCHIAL ULTRASOUND N/A 02/27/2015   Procedure: VIDEO BRONCHOSCOPY WITH ENDOBRONCHIAL ULTRASOUND;  Surgeon: Grace Isaac, MD;  Location: [facility] OR;  Service:  Thoracic;  Laterality: N/A;   Family History  Problem Relation Age of Onset  . Cancer Other   . Heart disease Father   . Lung cancer Father   . Hypertension Other   . Diabetes Mother   . Valvular heart disease Mother   . Diabetes Sister   . Colon polyps Maternal Uncle   . Diabetes Sister   . Diabetes Sister   . Diabetes Niece   . Colon cancer Cousin   . Esophageal cancer Neg Hx   . Stomach cancer Neg Hx   . Pancreatic cancer Neg Hx    Social History   Tobacco Use  . Smoking status: Current Every Day Smoker    Packs/day: 1.50    Years: 47.00    Pack years: 70.50    Types: Cigarettes    Start date: 02/14/1967  . Smokeless tobacco: Never Used  Substance Use Topics  . Alcohol use: Yes    Alcohol/week: 0.0 standard drinks    Comment: ocassional  . Drug use: No   Current Outpatient Medications  Medication Sig Dispense Refill  . acetaminophen (TYLENOL) 500 MG tablet Take 1,000 mg by mouth every 6 (six) hours as needed for moderate pain or headache.    . albuterol (VENTOLIN HFA) 108 (90 Base) MCG/ACT inhaler Inhale 1 puff into the lungs every 6 (six) hours as needed for wheezing or shortness of breath.    Marland Kitchen amLODipine (NORVASC) 10 MG tablet Take 10 mg by mouth daily.     . Ascorbic Acid (VITAMIN C) 500 MG CAPS Take 1 capsule by mouth daily.    Marland Kitchen aspirin 81 MG tablet Take 81 mg by mouth daily.    Marland Kitchen atorvastatin (LIPITOR) 40 MG tablet Take 40 mg by mouth daily.    . Cholecalciferol (VITAMIN D3) 5000 UNITS TABS Take 5,000 Units by mouth daily.    . ferrous sulfate 325 (65 FE) MG tablet Take 235 mg by mouth daily with breakfast.    . finasteride (PROSCAR) 5 MG tablet Take 1 tablet (5 mg total) by mouth daily. 30 tablet 11  . fluticasone (FLONASE) 50 MCG/ACT nasal spray Place 2 sprays into both nostrils daily as needed for allergies.     Marland Kitchen gabapentin (NEURONTIN) 100 MG capsule Take 100 mg by mouth 2 (two) times daily.    Marland Kitchen HYDROcodone-acetaminophen (NORCO/VICODIN) 5-325 MG tablet  Take 1 tablet by mouth every 6 (six) hours as needed for moderate pain.    . Lidocaine 4 % PTCH Apply 1 patch topically at bedtime.    Marland Kitchen LORazepam (ATIVAN) 0.5 MG tablet Take 0.5 mg by mouth daily as needed for anxiety.    Marland Kitchen losartan-hydrochlorothiazide (HYZAAR) 100-12.5 MG tablet Take 1 tablet by mouth daily.    Marland Kitchen  methocarbamol (ROBAXIN) 500 MG tablet Take 1,000 mg by mouth every 6 (six) hours as needed for muscle spasms.    Marland Kitchen oxyCODONE (OXY IR/ROXICODONE) 5 MG immediate release tablet Take 1 tablet (5 mg total) by mouth every 6 (six) hours as needed for moderate pain or severe pain ((score 4 to 6)). 30 tablet 0  . SYMBICORT 160-4.5 MCG/ACT inhaler Inhale 2 puffs into the lungs 2 (two) times daily.     . tamsulosin (FLOMAX) 0.4 MG CAPS capsule Take 1 capsule (0.4 mg total) by mouth daily. 30 capsule 11  . Tiotropium Bromide Monohydrate (SPIRIVA RESPIMAT) 2.5 MCG/ACT AERS Inhale 1 puff into the lungs daily.    . traMADol (ULTRAM) 50 MG tablet Take 50 mg by mouth 2 (two) times daily as needed.     No current facility-administered medications for this visit.   No Known Allergies   Review of Systems: All systems reviewed and negative except where noted in HPI.   Creatinine clearance cannot be calculated (Patient's most recent lab result is older than the maximum 21 days allowed.)   Physical Exam:    Wt Readings from Last 3 Encounters:  12/22/19 191 lb (86.6 kg)  11/14/19 201 lb (91.2 kg)  11/08/19 204 lb (92.5 kg)    BP 124/62 (BP Location: Right Arm, Patient Position: Sitting, Cuff Size: Normal)   Pulse 98   Temp 97.7 F (36.5 C)   Ht 5' 9.5" (1.765 m)   Wt 191 lb (86.6 kg)   SpO2 94%   BMI 27.80 kg/m  Constitutional:  Pleasant male in no acute distress. Psychiatric: Normal mood and affect. Behavior is normal. EENT: Pupils normal.  Conjunctivae are normal. No scleral icterus. Neck supple.  Cardiovascular: Normal rate, regular rhythm. No edema Pulmonary/chest: Effort normal  and breath sounds normal. No wheezing, rales or rhonchi. Abdominal: Soft, nondistended, nontender. Bowel sounds active throughout. There are no masses palpable. No hepatomegaly. Neurological: Alert and oriented to person place and time. Skin: Skin is warm and dry. No rashes noted.  Tye Savoy, NP  12/22/2019, 3:12 PM  Cc:  Referring Provider Virgel Bouquet, MD

## 2019-12-25 NOTE — Progress Notes (Signed)
____________________________________________________________  Attending physician addendum:  Thank you for sending this case to me. I have reviewed the entire note, and the outlined plan seems appropriate.  Audrianna Driskill Danis, MD  ____________________________________________________________  

## 2020-01-09 ENCOUNTER — Ambulatory Visit: Payer: Medicare Other | Admitting: Gastroenterology

## 2020-01-22 ENCOUNTER — Other Ambulatory Visit (HOSPITAL_COMMUNITY)
Admission: RE | Admit: 2020-01-22 | Discharge: 2020-01-22 | Disposition: A | Discharge status: Home / Self Care | Payer: Medicare Other | Source: Ambulatory Visit | Attending: Gastroenterology | Admitting: Gastroenterology

## 2020-01-22 ENCOUNTER — Other Ambulatory Visit: Payer: Medicare Other

## 2020-01-22 DIAGNOSIS — Z20822 Contact with and (suspected) exposure to covid-19: Secondary | ICD-10-CM | POA: Insufficient documentation

## 2020-01-22 DIAGNOSIS — Z01812 Encounter for preprocedural laboratory examination: Secondary | ICD-10-CM | POA: Diagnosis present

## 2020-01-22 LAB — SARS CORONAVIRUS 2 (TAT 6-24 HRS): SARS Coronavirus 2: NEGATIVE

## 2020-01-24 NOTE — Anesthesia Preprocedure Evaluation (Addendum)
Anesthesia Evaluation  Patient identified by MRN, date of birth, ID band Patient awake    Reviewed: Allergy & Precautions, NPO status , Patient's Chart, lab work & pertinent test results  History of Anesthesia Complications Negative for: history of anesthetic complications  Airway Mallampati: I  TM Distance: >3 FB Neck ROM: Full    Dental  (+) Poor Dentition, Loose, Dental Advisory Given   Pulmonary COPD,  COPD inhaler and oxygen dependent, Current SmokerPatient did not abstain from smoking.,  01/22/2020 SARS coronavirus neg  Lung cancer   breath sounds clear to auscultation + decreased breath sounds      Cardiovascular hypertension, Pt. on medications (-) angina+ CAD (non-obstructive on cardiac CT)   Rhythm:Regular Rate:Normal  '19 stress: EF 75%, small/mild reversible inferior defect   Neuro/Psych Anxiety negative neurological ROS     GI/Hepatic Neg liver ROS, GERD  Controlled,  Endo/Other  negative endocrine ROS  Renal/GU negative Renal ROS     Musculoskeletal  (+) Arthritis ,   Abdominal   Peds  Hematology   Anesthesia Other Findings   Reproductive/Obstetrics                            Anesthesia Physical Anesthesia Plan  ASA: III  Anesthesia Plan: MAC   Post-op Pain Management:    Induction:   PONV Risk Score and Plan: 0 and Treatment may vary due to age or medical condition  Airway Management Planned: Natural Airway and Simple Face Mask  Additional Equipment: None  Intra-op Plan:   Post-operative Plan:   Informed Consent: I have reviewed the patients History and Physical, chart, labs and discussed the procedure including the risks, benefits and alternatives for the proposed anesthesia with the patient or authorized representative who has indicated his/her understanding and acceptance.     Dental advisory given  Plan Discussed with: CRNA and Surgeon  Anesthesia  Plan Comments:        Anesthesia Quick Evaluation

## 2020-01-25 ENCOUNTER — Encounter (HOSPITAL_COMMUNITY): Payer: Self-pay | Admitting: Gastroenterology

## 2020-01-25 ENCOUNTER — Ambulatory Visit (HOSPITAL_COMMUNITY): Payer: Medicare Other | Admitting: Anesthesiology

## 2020-01-25 ENCOUNTER — Encounter (HOSPITAL_COMMUNITY)
Admission: RE | Disposition: A | Discharge status: Home / Self Care | Payer: Self-pay | Source: Home / Self Care | Attending: Gastroenterology

## 2020-01-25 ENCOUNTER — Ambulatory Visit (HOSPITAL_COMMUNITY)
Admission: RE | Admit: 2020-01-25 | Discharge: 2020-01-25 | Disposition: A | Discharge status: Home / Self Care | Payer: Medicare Other | Attending: Gastroenterology | Admitting: Gastroenterology

## 2020-01-25 ENCOUNTER — Other Ambulatory Visit: Payer: Self-pay

## 2020-01-25 DIAGNOSIS — K635 Polyp of colon: Secondary | ICD-10-CM

## 2020-01-25 DIAGNOSIS — Z981 Arthrodesis status: Secondary | ICD-10-CM | POA: Diagnosis not present

## 2020-01-25 DIAGNOSIS — Z87442 Personal history of urinary calculi: Secondary | ICD-10-CM | POA: Diagnosis not present

## 2020-01-25 DIAGNOSIS — Z9981 Dependence on supplemental oxygen: Secondary | ICD-10-CM | POA: Insufficient documentation

## 2020-01-25 DIAGNOSIS — I2510 Atherosclerotic heart disease of native coronary artery without angina pectoris: Secondary | ICD-10-CM | POA: Insufficient documentation

## 2020-01-25 DIAGNOSIS — N400 Enlarged prostate without lower urinary tract symptoms: Secondary | ICD-10-CM | POA: Diagnosis not present

## 2020-01-25 DIAGNOSIS — K31819 Angiodysplasia of stomach and duodenum without bleeding: Secondary | ICD-10-CM | POA: Diagnosis not present

## 2020-01-25 DIAGNOSIS — F419 Anxiety disorder, unspecified: Secondary | ICD-10-CM | POA: Diagnosis not present

## 2020-01-25 DIAGNOSIS — R195 Other fecal abnormalities: Secondary | ICD-10-CM

## 2020-01-25 DIAGNOSIS — D500 Iron deficiency anemia secondary to blood loss (chronic): Secondary | ICD-10-CM | POA: Diagnosis present

## 2020-01-25 DIAGNOSIS — J449 Chronic obstructive pulmonary disease, unspecified: Secondary | ICD-10-CM | POA: Diagnosis not present

## 2020-01-25 DIAGNOSIS — K219 Gastro-esophageal reflux disease without esophagitis: Secondary | ICD-10-CM | POA: Insufficient documentation

## 2020-01-25 DIAGNOSIS — D120 Benign neoplasm of cecum: Secondary | ICD-10-CM | POA: Diagnosis not present

## 2020-01-25 DIAGNOSIS — G2581 Restless legs syndrome: Secondary | ICD-10-CM | POA: Insufficient documentation

## 2020-01-25 DIAGNOSIS — Z85118 Personal history of other malignant neoplasm of bronchus and lung: Secondary | ICD-10-CM | POA: Diagnosis not present

## 2020-01-25 DIAGNOSIS — Z8611 Personal history of tuberculosis: Secondary | ICD-10-CM | POA: Insufficient documentation

## 2020-01-25 DIAGNOSIS — M1990 Unspecified osteoarthritis, unspecified site: Secondary | ICD-10-CM | POA: Diagnosis not present

## 2020-01-25 DIAGNOSIS — I110 Hypertensive heart disease with heart failure: Secondary | ICD-10-CM | POA: Diagnosis not present

## 2020-01-25 DIAGNOSIS — D509 Iron deficiency anemia, unspecified: Secondary | ICD-10-CM

## 2020-01-25 DIAGNOSIS — E785 Hyperlipidemia, unspecified: Secondary | ICD-10-CM | POA: Insufficient documentation

## 2020-01-25 HISTORY — PX: BIOPSY: SHX5522

## 2020-01-25 HISTORY — PX: HEMOSTASIS CLIP PLACEMENT: SHX6857

## 2020-01-25 HISTORY — PX: COLONOSCOPY WITH PROPOFOL: SHX5780

## 2020-01-25 HISTORY — PX: ESOPHAGOGASTRODUODENOSCOPY (EGD) WITH PROPOFOL: SHX5813

## 2020-01-25 HISTORY — PX: HOT HEMOSTASIS: SHX5433

## 2020-01-25 SURGERY — COLONOSCOPY WITH PROPOFOL
Anesthesia: Monitor Anesthesia Care

## 2020-01-25 MED ORDER — ALBUTEROL SULFATE (2.5 MG/3ML) 0.083% IN NEBU
2.5000 mg | INHALATION_SOLUTION | Freq: Once | RESPIRATORY_TRACT | Status: AC
  Administered 2020-01-25: 2.5 mg via RESPIRATORY_TRACT

## 2020-01-25 MED ORDER — SODIUM CHLORIDE 0.9 % IV SOLN: INTRAVENOUS | Status: DC

## 2020-01-25 MED ORDER — LACTATED RINGERS IV SOLN
INTRAVENOUS | Status: DC
  Administered 2020-01-25: 1000 mL via INTRAVENOUS

## 2020-01-25 MED ORDER — ALBUTEROL SULFATE (2.5 MG/3ML) 0.083% IN NEBU
INHALATION_SOLUTION | RESPIRATORY_TRACT | Status: AC
  Filled 2020-01-25: qty 3

## 2020-01-25 MED ORDER — PROPOFOL 500 MG/50ML IV EMUL
INTRAVENOUS | Status: DC | PRN
  Administered 2020-01-25: 150 ug/kg/min via INTRAVENOUS

## 2020-01-25 MED ORDER — PROPOFOL 1000 MG/100ML IV EMUL
INTRAVENOUS | Status: AC
  Filled 2020-01-25: qty 200

## 2020-01-25 MED ORDER — PHENYLEPHRINE HCL (PRESSORS) 10 MG/ML IV SOLN
INTRAVENOUS | Status: DC | PRN
  Administered 2020-01-25: 120 ug via INTRAVENOUS
  Administered 2020-01-25 (×4): 80 ug via INTRAVENOUS
  Administered 2020-01-25: 120 ug via INTRAVENOUS
  Administered 2020-01-25: 80 ug via INTRAVENOUS

## 2020-01-25 MED ORDER — EPHEDRINE SULFATE 50 MG/ML IJ SOLN
INTRAMUSCULAR | Status: DC | PRN
Start: 2020-01-25 — End: 2020-01-25
  Administered 2020-01-25: 10 mg via INTRAVENOUS

## 2020-01-25 SURGICAL SUPPLY — 24 items

## 2020-01-25 NOTE — Anesthesia Postprocedure Evaluation (Signed)
Anesthesia Post Note  Patient: Chatham Howington Cuccaro  Procedure(s) Performed: COLONOSCOPY WITH PROPOFOL (N/A ) ESOPHAGOGASTRODUODENOSCOPY (EGD) WITH PROPOFOL (N/A ) BIOPSY HOT HEMOSTASIS (ARGON PLASMA COAGULATION/BICAP) (N/A ) HEMOSTASIS CLIP PLACEMENT     Patient location during evaluation: Endoscopy Anesthesia Type: MAC Level of consciousness: awake and alert, patient cooperative and oriented Pain management: pain level controlled Vital Signs Assessment: post-procedure vital signs reviewed and stable Respiratory status: spontaneous breathing, nonlabored ventilation, respiratory function stable and patient connected to nasal cannula oxygen Cardiovascular status: blood pressure returned to baseline and stable Postop Assessment: no apparent nausea or vomiting Anesthetic complications: no    Last Vitals:  Vitals:   01/25/20 0657 01/25/20 0847  BP: (!) 169/76 105/63  Pulse: 93 86  Resp: (!) 21 (!) 22  Temp: 37 C (!) 36.4 C  SpO2: 96% 97%    Last Pain:  Vitals:   01/25/20 0847  TempSrc: Oral  PainSc: Asleep                 JACKSON,E. CARSWELL

## 2020-01-25 NOTE — Transfer of Care (Signed)
Immediate Anesthesia Transfer of Care Note  Patient: Colton Nguyen  Procedure(s) Performed: COLONOSCOPY WITH PROPOFOL (N/A ) ESOPHAGOGASTRODUODENOSCOPY (EGD) WITH PROPOFOL (N/A ) BIOPSY HOT HEMOSTASIS (ARGON PLASMA COAGULATION/BICAP) (N/A ) HEMOSTASIS CLIP PLACEMENT  Patient Location: PACU  Anesthesia Type:MAC  Level of Consciousness: sedated, patient cooperative and responds to stimulation  Airway & Oxygen Therapy: Patient Spontanous Breathing and Patient connected to face mask oxygen  Post-op Assessment: Report given to RN and Post -op Vital signs reviewed and stable  Post vital signs: Reviewed and stable  Last Vitals:  Vitals Value Taken Time  BP    Temp    Pulse 82 01/25/20 0845  Resp 19 01/25/20 0845  SpO2 97 % 01/25/20 0845  Vitals shown include unvalidated device data.  Last Pain:  Vitals:   01/25/20 0657  TempSrc: Oral  PainSc: 0-No pain         Complications: No apparent anesthesia complications

## 2020-01-25 NOTE — Interval H&P Note (Signed)
History and Physical Interval Note:  01/25/2020 7:35 AM  Colton Nguyen  has presented today for surgery, with the diagnosis of Anemia, Heme + STool.  The various methods of treatment have been discussed with the patient and family. After consideration of risks, benefits and other options for treatment, the patient has consented to  Procedure(s): COLONOSCOPY WITH PROPOFOL (N/A) ESOPHAGOGASTRODUODENOSCOPY (EGD) WITH PROPOFOL (N/A) as a surgical intervention.  The patient's history has been reviewed, patient examined, no change in status, stable for surgery.  I have reviewed the patient's chart and labs.  Questions were answered to the patient's satisfaction.     Nelida Meuse III

## 2020-01-25 NOTE — Op Note (Signed)
[facility] Patient Name: Colton Nguyen Procedure Date: 01/25/2020 MRN: [id number] Attending MD: Estill Cotta. Loletha Carrow , MD Date of Birth: 12/06/1945 CSN: [id number] Age: 74 Admit Type: Outpatient Procedure:                Colonoscopy Indications:              Heme positive stool, Iron deficiency anemia                            secondary to chronic blood loss Providers:                Mallie Mussel L. Loletha Carrow, MD, Cleda Daub, RN, William Dalton, Technician Referring MD:             Lamonte Sakai, MD Medicines:                Monitored Anesthesia Care Complications:            No immediate complications. Estimated Blood Loss:     Estimated blood loss was minimal. Procedure:                Pre-Anesthesia Assessment:                           - Prior to the procedure, a History and Physical                            was performed, and patient medications and                            allergies were reviewed. The patient's tolerance of                            previous anesthesia was also reviewed. The risks                            and benefits of the procedure and the sedation                            options and risks were discussed with the patient.                            All questions were answered, and informed consent                            was obtained. Prior Anticoagulants: The patient has                            taken no previous anticoagulant or antiplatelet                            agents except for aspirin. ASA Grade Assessment:  III - A patient with severe systemic disease. After                            reviewing the risks and benefits, the patient was                            deemed in satisfactory condition to undergo the                            procedure.                           After obtaining informed consent, the colonoscope                            was passed under direct vision.  Throughout the                            procedure, the patient's blood pressure, pulse, and                            oxygen saturations were monitored continuously. The                            [id number]-HQ190L ([id number]) Olympus colonoscope was                            introduced through the anus and advanced to the the                            cecum, identified by appendiceal orifice and                            ileocecal valve. The colonoscopy was somewhat                            difficult due to a redundant colon and significant                            looping. Successful completion of the procedure was                            aided by using manual pressure. The patient                            tolerated the procedure well. The quality of the                            bowel preparation was generally good except fair in                            proximal right colon. The ileocecal valve,  appendiceal orifice, and rectum were photographed. Scope In: 7:51:30 AM Scope Out: 8:20:28 AM Scope Withdrawal Time: 0 hours 21 minutes 10 seconds  Total Procedure Duration: 0 hours 28 minutes 58 seconds  Findings:      The perianal and digital rectal examinations were normal except for a       palpable hypertrophied anal papilla.      A 8 mm polyp was found in the ileocecal valve. The polyp was flat. The       polyp was removed with a piecemeal technique using a cold biopsy       forceps. (location and morphology along with challeging scope position       due to looping precluded snare polypectomy) Resection and retrieval were       complete.      The exam was otherwise without abnormality on direct and retroflexion       views. Impression:               - One 8 mm polyp at the ileocecal valve, removed                            piecemeal using a cold biopsy forceps. Resected and                            retrieved.                           - The  examination was otherwise normal on direct                            and retroflexion views. Moderate Sedation:      MAC sedation used Recommendation:           - Patient has a contact number available for                            emergencies. The signs and symptoms of potential                            delayed complications were discussed with the                            patient. Return to normal activities tomorrow.                            Written discharge instructions were provided to the                            patient.                           - Resume previous diet.                           - Continue present medications.                           - Await pathology results.                           -  No repeat colonoscopy.                           - See the other procedure note for documentation of                            additional recommendations. Procedure Code(s):        --- Professional ---                           [phone], Colonoscopy, flexible; with biopsy, single                            or multiple Diagnosis Code(s):        --- Professional ---                           K63.5, Polyp of colon                           R19.5, Other fecal abnormalities                           D50.0, Iron deficiency anemia secondary to blood                            loss (chronic) CPT copyright 2019 [employer]. All rights reserved. The codes documented in this report are preliminary and upon coder review may  be revised to meet current compliance requirements. Fatimata Talsma L. Loletha Carrow, MD 01/25/2020 8:43:58 AM This report has been signed electronically. Number of Addenda: 0

## 2020-01-25 NOTE — Op Note (Signed)
[facility] Patient Name: Colton Nguyen Procedure Date: 01/25/2020 MRN: [id number] Attending MD: Estill Cotta. Loletha Carrow , MD Date of Birth: 11-26-1945 CSN: [id number] Age: 74 Admit Type: Outpatient Procedure:                Upper GI endoscopy Indications:              Iron deficiency anemia secondary to chronic blood                            loss, Heme positive stool Providers:                Mallie Mussel L. Loletha Carrow, MD, Cleda Daub, RN, William Dalton, Technician Referring MD:             Lamonte Sakai, MD Medicines:                Monitored Anesthesia Care Complications:            No immediate complications. Estimated Blood Loss:     Estimated blood loss was minimal. Procedure:                Pre-Anesthesia Assessment:                           - Prior to the procedure, a History and Physical                            was performed, and patient medications and                            allergies were reviewed. The patient's tolerance of                            previous anesthesia was also reviewed. The risks                            and benefits of the procedure and the sedation                            options and risks were discussed with the patient.                            All questions were answered, and informed consent                            was obtained. Prior Anticoagulants: The patient has                            taken no previous anticoagulant or antiplatelet                            agents except for aspirin. ASA Grade Assessment:  III - A patient with severe systemic disease. After                            reviewing the risks and benefits, the patient was                            deemed in satisfactory condition to undergo the                            procedure.                           After obtaining informed consent, the endoscope was                            passed under direct  vision. Throughout the                            procedure, the patient's blood pressure, pulse, and                            oxygen saturations were monitored continuously. The                            [id number]-H190 ([id number]) Olympus gastroscope was                            introduced through the mouth, and advanced to the                            second part of duodenum. The upper GI endoscopy was                            accomplished without difficulty. The patient                            tolerated the procedure well. Scope In: Scope Out: Findings:      The esophagus was normal.      The stomach was normal.      The cardia and gastric fundus were normal on retroflexion.      A single 3 mm angioectasia without bleeding was found in the duodenal       bulb. Coagulation for hemostasis using argon plasma at 0.5 liters/minute       and 20 watts was successful. To prevent bleeding post-intervention, one       hemostatic clip was successfully placed (MR conditional).      The exam of the duodenum was otherwise normal. Impression:               - Normal esophagus.                           - Normal stomach.                           - A single non-bleeding angioectasia in the  duodenum. Treated with argon plasma coagulation                            (APC). Clip (MR conditional) was placed.                           - No specimens collected.                           If patient has recurrent IDA despite adequate iron                            replacement, then there are most likely additional                            small bowel AVMs beyond the reach of an endoscope.                            In that case, patient would require long term iron                            replacement (eith oral or IV as indicated) managed                            by primary care. Moderate Sedation:      MAC sedation used Recommendation:           - Patient has a  contact number available for                            emergencies. The signs and symptoms of potential                            delayed complications were discussed with the                            patient. Return to normal activities tomorrow.                            Written discharge instructions were provided to the                            patient.                           - Resume previous diet.                           - Continue present medications.                           - See the other procedure note for documentation of                            additional recommendations.                           -  Follow up with your primary care provider to                            check blood work for the anemia, and continue oral                            iron as long as they recommend. Procedure Code(s):        --- Professional ---                           [phone], Esophagogastroduodenoscopy, flexible,                            transoral; with control of bleeding, any method Diagnosis Code(s):        --- Professional ---                           I29.798, Angiodysplasia of stomach and duodenum                            without bleeding                           D50.0, Iron deficiency anemia secondary to blood                            loss (chronic)                           R19.5, Other fecal abnormalities CPT copyright 2019 American Medical Association. All rights reserved. The codes documented in this report are preliminary and upon coder review may  be revised to meet current compliance requirements. Oneka Parada L. Loletha Carrow, MD 01/25/2020 8:50:22 AM This report has been signed electronically. Number of Addenda: 0

## 2020-01-25 NOTE — H&P (Signed)
History:  This patient presents for endoscopic testing for iron deficiency anemia and heme positive stool.  Colton Nguyen Referring physician: [name], MD  Past Medical History: Past Medical History:  Diagnosis Date  . Anxiety    situational  . Arthritis   . BPH (benign prostatic hyperplasia)   . CAD (coronary artery disease)   . CHF (congestive heart failure) ([place])    pt denies 11/13/2019  . Chronic cardiopulmonary disease ([place])   . COPD (chronic obstructive pulmonary disease) ([place])   . History of kidney stones   . Hyperlipidemia   . Hypertension   . Hypertension 12/28/2016  . lung ca dx'd 03/2015  . Mass of lung   . Nicotine dependence   . Personal history of kidney stones   . Restless leg   . Shortness of breath dyspnea    with exertion  . Tuberculosis   . Wears glasses      Past Surgical History: Past Surgical History:  Procedure Laterality Date  . BACK SURGERY    . CAD CCTA 01/31/15    . CERVICAL FUSION    . COLONOSCOPY    . FRACTURE SURGERY     left ankle  . LUNG BIOPSY N/A 02/27/2015   Procedure: LUNG BIOPSY;  Surgeon: Grace Isaac, MD;  Location: [facility];  Service: Thoracic;  Laterality: N/A;  . right inguinal hernia repair at age 58    . VIDEO BRONCHOSCOPY WITH ENDOBRONCHIAL ULTRASOUND N/A 02/27/2015   Procedure: VIDEO BRONCHOSCOPY WITH ENDOBRONCHIAL ULTRASOUND;  Surgeon: Grace Isaac, MD;  Location: [facility];  Service: Thoracic;  Laterality: N/A;    Allergies: No Known Allergies  Outpatient Meds: Current Facility-Administered Medications  Medication Dose Route Frequency Provider Last Rate Last Admin  . 0.9 %  sodium chloride infusion   Intravenous Continuous Willia Craze, NP      . lactated ringers infusion   Intravenous Continuous Nelida Meuse III, MD 10 mL/hr at 01/25/20 0714 1,000 mL at 01/25/20 0714      ___________________________________________________________________ Objective   Exam:  BP (!) 169/76   Pulse 93    Temp 98.6 F (37 C) (Oral)   Resp (!) 21   Ht 5\' 10"  (1.778 m)   Wt 88.5 kg   SpO2 96%   BMI 27.99 kg/m    CV: RRR without murmur, S1/S2, no JVD, no peripheral edema  Resp: clear to auscultation bilaterally, normal RR and effort noted.  Breathing comfortably on 2 liters [place] of supplemental oxygen  GI: soft, no tenderness, with active bowel sounds. No guarding or palpable organomegaly noted.  Neuro: awake, alert and oriented x 3. Normal gross motor function and fluent speech   Assessment:  IDA, heme pos stool  Plan:  EGD and colonoscopy   Nelida Meuse III

## 2020-01-25 NOTE — Discharge Instructions (Signed)
YOU HAD AN ENDOSCOPIC PROCEDURE TODAY: Refer to the procedure report and other information in the discharge instructions given to you for any specific questions about what was found during the examination. If this information does not answer your questions, please call [place] office at [phone] to clarify.  ° °YOU SHOULD EXPECT: Some feelings of bloating in the abdomen. Passage of more gas than usual. Walking can help get rid of the air that was put into your GI tract during the procedure and reduce the bloating. If you had a lower endoscopy (such as a colonoscopy or flexible sigmoidoscopy) you may notice spotting of blood in your stool or on the toilet paper. Some abdominal soreness may be present for a day or two, also. ° °DIET: Your first meal following the procedure should be a light meal and then it is ok to progress to your normal diet. A half-sandwich or bowl of soup is an example of a good first meal. Heavy or fried foods are harder to digest and may make you feel nauseous or bloated. Drink plenty of fluids but you should avoid alcoholic beverages for 24 hours. If you had a esophageal dilation, please see attached instructions for diet.   ° °ACTIVITY: Your care partner should take you home directly after the procedure. You should plan to take it easy, moving slowly for the rest of the day. You can resume normal activity the day after the procedure however YOU SHOULD NOT DRIVE, use power tools, machinery or perform tasks that involve climbing or major physical exertion for 24 hours (because of the sedation medicines used during the test).  ° °SYMPTOMS TO REPORT IMMEDIATELY: °A gastroenterologist can be reached at any hour. Please call [phone]  for any of the following symptoms:  °Following lower endoscopy (colonoscopy, flexible sigmoidoscopy) °Excessive amounts of blood in the stool  °Significant tenderness, worsening of abdominal pains  °Swelling of the abdomen that is new, acute  °Fever of 100° or  higher  °Following upper endoscopy (EGD, EUS, ERCP, esophageal dilation) °Vomiting of blood or coffee ground material  °New, significant abdominal pain  °New, significant chest pain or pain under the shoulder blades  °Painful or persistently difficult swallowing  °New shortness of breath  °Black, tarry-looking or red, bloody stools ° °FOLLOW UP:  °If any biopsies were taken you will be contacted by phone or by letter within the next 1-3 weeks. Call [phone]  if you have not heard about the biopsies in 3 weeks.  °Please also call with any specific questions about appointments or follow up tests. ° °

## 2020-01-26 ENCOUNTER — Encounter: Payer: Self-pay | Admitting: Gastroenterology

## 2020-01-26 ENCOUNTER — Encounter: Payer: Self-pay | Admitting: *Deleted

## 2020-01-26 LAB — SURGICAL PATHOLOGY

## 2020-07-09 ENCOUNTER — Ambulatory Visit (HOSPITAL_COMMUNITY)
Admission: RE | Admit: 2020-07-09 | Discharge: 2020-07-09 | Disposition: A | Discharge status: Home / Self Care | Payer: Medicare Other | Source: Ambulatory Visit | Attending: Internal Medicine | Admitting: Internal Medicine

## 2020-07-09 ENCOUNTER — Ambulatory Visit: Payer: Medicare Other | Admitting: Internal Medicine

## 2020-07-09 ENCOUNTER — Other Ambulatory Visit: Payer: Self-pay

## 2020-07-09 ENCOUNTER — Inpatient Hospital Stay: Payer: Medicare Other | Attending: Internal Medicine

## 2020-07-09 ENCOUNTER — Encounter (HOSPITAL_COMMUNITY): Payer: Self-pay

## 2020-07-09 DIAGNOSIS — R0602 Shortness of breath: Secondary | ICD-10-CM | POA: Diagnosis not present

## 2020-07-09 DIAGNOSIS — C3490 Malignant neoplasm of unspecified part of unspecified bronchus or lung: Secondary | ICD-10-CM | POA: Insufficient documentation

## 2020-07-09 DIAGNOSIS — Z923 Personal history of irradiation: Secondary | ICD-10-CM | POA: Diagnosis not present

## 2020-07-09 DIAGNOSIS — Z9981 Dependence on supplemental oxygen: Secondary | ICD-10-CM | POA: Insufficient documentation

## 2020-07-09 DIAGNOSIS — Z9221 Personal history of antineoplastic chemotherapy: Secondary | ICD-10-CM | POA: Insufficient documentation

## 2020-07-09 DIAGNOSIS — Z87442 Personal history of urinary calculi: Secondary | ICD-10-CM | POA: Insufficient documentation

## 2020-07-09 DIAGNOSIS — Z85118 Personal history of other malignant neoplasm of bronchus and lung: Secondary | ICD-10-CM | POA: Insufficient documentation

## 2020-07-09 LAB — CBC WITH DIFFERENTIAL (CANCER CENTER ONLY)
Abs Immature Granulocytes: 0.02 10*3/uL (ref 0.00–0.07)
Basophils Absolute: 0 10*3/uL (ref 0.0–0.1)
Basophils Relative: 0 %
Eosinophils Absolute: 0.1 10*3/uL (ref 0.0–0.5)
Eosinophils Relative: 1 %
HCT: 34.3 % — ABNORMAL LOW (ref 39.0–52.0)
Hemoglobin: 11.2 g/dL — ABNORMAL LOW (ref 13.0–17.0)
Immature Granulocytes: 0 %
Lymphocytes Relative: 13 %
Lymphs Abs: 0.7 10*3/uL (ref 0.7–4.0)
MCH: 26.3 pg (ref 26.0–34.0)
MCHC: 32.7 g/dL (ref 30.0–36.0)
MCV: 80.5 fL (ref 80.0–100.0)
Monocytes Absolute: 0.4 10*3/uL (ref 0.1–1.0)
Monocytes Relative: 8 %
Neutro Abs: 4.3 10*3/uL (ref 1.7–7.7)
Neutrophils Relative %: 78 %
Platelet Count: 273 10*3/uL (ref 150–400)
RBC: 4.26 MIL/uL (ref 4.22–5.81)
RDW: 14.9 % (ref 11.5–15.5)
WBC Count: 5.5 10*3/uL (ref 4.0–10.5)
nRBC: 0 % (ref 0.0–0.2)

## 2020-07-09 LAB — CMP (CANCER CENTER ONLY)
ALT: 9 U/L (ref 0–44)
AST: 15 U/L (ref 15–41)
Albumin: 3.2 g/dL — ABNORMAL LOW (ref 3.5–5.0)
Alkaline Phosphatase: 94 U/L (ref 38–126)
Anion gap: 9 (ref 5–15)
BUN: 12 mg/dL (ref 8–23)
CO2: 30 mmol/L (ref 22–32)
Calcium: 9.1 mg/dL (ref 8.9–10.3)
Chloride: 96 mmol/L — ABNORMAL LOW (ref 98–111)
Creatinine: 0.93 mg/dL (ref 0.61–1.24)
GFR, Estimated: >60 mL/min (ref >60)
Glucose, Bld: 102 mg/dL — ABNORMAL HIGH (ref 70–99)
Potassium: 3.6 mmol/L (ref 3.5–5.1)
Sodium: 135 mmol/L (ref 135–145)
Total Bilirubin: 0.5 mg/dL (ref 0.3–1.2)
Total Protein: 7 g/dL (ref 6.5–8.1)

## 2020-07-09 MED ORDER — IOHEXOL 300 MG/ML  SOLN
75.0000 mL | Freq: Once | INTRAMUSCULAR | Status: AC | PRN
  Administered 2020-07-09: 75 mL via INTRAVENOUS

## 2020-07-15 ENCOUNTER — Other Ambulatory Visit: Payer: Self-pay

## 2020-07-15 ENCOUNTER — Encounter: Payer: Self-pay | Admitting: Internal Medicine

## 2020-07-15 ENCOUNTER — Inpatient Hospital Stay (HOSPITAL_BASED_OUTPATIENT_CLINIC_OR_DEPARTMENT_OTHER): Payer: Medicare Other | Admitting: Internal Medicine

## 2020-07-15 VITALS — BP 128/73 | HR 96 | Temp 98.6°F | Resp 12 | Ht 69.5 in | Wt 160.9 lb

## 2020-07-15 DIAGNOSIS — Z85118 Personal history of other malignant neoplasm of bronchus and lung: Secondary | ICD-10-CM | POA: Diagnosis not present

## 2020-07-15 DIAGNOSIS — C3411 Malignant neoplasm of upper lobe, right bronchus or lung: Secondary | ICD-10-CM

## 2020-07-15 DIAGNOSIS — C3490 Malignant neoplasm of unspecified part of unspecified bronchus or lung: Secondary | ICD-10-CM

## 2020-07-15 NOTE — Progress Notes (Signed)
[facility] [phone]   Fax:([phone]  OFFICE PROGRESS NOTE  [name], MD [place] [address]  DIAGNOSIS: Stage IIIA (T3, N2, M0) non-small cell lung cancer, squamous cell carcinoma diagnosed in June 2016.  PRIOR THERAPY:  1) Concurrent chemoradiation with weekly carboplatin for an AUC of 2 and paclitaxel 45 mg/m2 with partial response.  2) Consolidation systemic chemotherapy with carboplatin for AUC of 5 and paclitaxel 175 MG/M2 every 3 weeks. First cycle 06/10/2015. Status post 3 cycles.  CURRENT THERAPY: Observation.  INTERVAL HISTORY: Colton Nguyen 74 y.o. male returns to the clinic today for annual follow-up visit.  The patient is feeling fine today with no concerning complaints.  He denied having any chest pain but continues to have the baseline shortness of breath and he is on home oxygen.  He denied having any cough or hemoptysis.  He denied having any weight loss or night sweats.  He has no nausea, vomiting, diarrhea or constipation.  He denied having any headache or visual changes.  He lost a few pounds recently because of stress taking care of his ill son.  The patient had repeat CT scan of the chest performed recently and he is here for evaluation and discussion of his discuss results.   MEDICAL HISTORY: Past Medical History:  Diagnosis Date  . Anxiety    situational  . Arthritis   . BPH (benign prostatic hyperplasia)   . CAD (coronary artery disease)   . CHF (congestive heart failure) ([place])    pt denies 11/13/2019  . Chronic cardiopulmonary disease ([place])   . COPD (chronic obstructive pulmonary disease) ([place])   . History of kidney stones   . Hyperlipidemia   . Hypertension   . Hypertension 12/28/2016  . lung ca dx'd 03/2015  . Mass of lung   . Nicotine dependence   . Personal history of kidney stones   . Restless leg   . Shortness of breath dyspnea    with exertion  . Tuberculosis   . Wears  glasses     ALLERGIES:  has No Known Allergies.  MEDICATIONS:  Current Outpatient Medications  Medication Sig Dispense Refill  . acetaminophen (TYLENOL) 500 MG tablet Take 1,000 mg by mouth every 6 (six) hours as needed for moderate pain or headache.    . albuterol (VENTOLIN HFA) 108 (90 Base) MCG/ACT inhaler Inhale 1 puff into the lungs every 6 (six) hours as needed for wheezing or shortness of breath.    Marland Kitchen amLODipine (NORVASC) 10 MG tablet Take 10 mg by mouth daily.     Marland Kitchen aspirin 81 MG tablet Take 81 mg by mouth daily.    Marland Kitchen atorvastatin (LIPITOR) 40 MG tablet Take 40 mg by mouth daily.    . Cholecalciferol (VITAMIN D3) 5000 UNITS TABS Take 5,000 Units by mouth daily.    . ferrous sulfate 325 (65 FE) MG tablet Take 325 mg by mouth daily with breakfast.     . finasteride (PROSCAR) 5 MG tablet Take 1 tablet (5 mg total) by mouth daily. 30 tablet 11  . fluticasone (FLONASE) 50 MCG/ACT nasal spray Place 2 sprays into both nostrils daily as needed for allergies.     Marland Kitchen gabapentin (NEURONTIN) 100 MG capsule Take 100 mg by mouth 2 (two) times daily.    Marland Kitchen HYDROcodone-acetaminophen (NORCO/VICODIN) 5-325 MG tablet Take 1 tablet by mouth every 6 (six) hours as needed for moderate pain.    . Lidocaine  4 % PTCH Apply 1 patch topically daily as needed (pain).     . LORazepam (ATIVAN) 0.5 MG tablet Take 0.5 mg by mouth daily as needed for anxiety.    Marland Kitchen losartan-hydrochlorothiazide (HYZAAR) 100-12.5 MG tablet Take 1 tablet by mouth daily.    . methocarbamol (ROBAXIN) 500 MG tablet Take 1,000 mg by mouth every 6 (six) hours as needed for muscle spasms.    Marland Kitchen oxyCODONE (OXY IR/ROXICODONE) 5 MG immediate release tablet Take 1 tablet (5 mg total) by mouth every 6 (six) hours as needed for moderate pain or severe pain ((score 4 to 6)). 30 tablet 0  . SYMBICORT 160-4.5 MCG/ACT inhaler Inhale 2 puffs into the lungs 2 (two) times daily.     . tamsulosin (FLOMAX) 0.4 MG CAPS capsule Take 1 capsule (0.4 mg total) by  mouth daily. 30 capsule 11  . Tiotropium Bromide Monohydrate (SPIRIVA RESPIMAT) 2.5 MCG/ACT AERS Inhale 1 puff into the lungs daily.    . traMADol (ULTRAM) 50 MG tablet Take 50 mg by mouth every 12 (twelve) hours as needed for moderate pain.      No current facility-administered medications for this visit.    SURGICAL HISTORY:  Past Surgical History:  Procedure Laterality Date  . BACK SURGERY    . BIOPSY  01/25/2020   Procedure: BIOPSY;  Surgeon: Doran Stabler, MD;  Location: [facility];  Service: Gastroenterology;;  . CAD CCTA 01/31/15    . CERVICAL FUSION    . COLONOSCOPY    . COLONOSCOPY WITH PROPOFOL N/A 01/25/2020   Procedure: COLONOSCOPY WITH PROPOFOL;  Surgeon: Doran Stabler, MD;  Location: [facility];  Service: Gastroenterology;  Laterality: N/A;  . ESOPHAGOGASTRODUODENOSCOPY (EGD) WITH PROPOFOL N/A 01/25/2020   Procedure: ESOPHAGOGASTRODUODENOSCOPY (EGD) WITH PROPOFOL;  Surgeon: Doran Stabler, MD;  Location: [facility];  Service: Gastroenterology;  Laterality: N/A;  . FRACTURE SURGERY     left ankle  . HEMOSTASIS CLIP PLACEMENT  01/25/2020   Procedure: HEMOSTASIS CLIP PLACEMENT;  Surgeon: Doran Stabler, MD;  Location: [facility];  Service: Gastroenterology;;  . HOT HEMOSTASIS N/A 01/25/2020   Procedure: HOT HEMOSTASIS (ARGON PLASMA COAGULATION/BICAP);  Surgeon: Doran Stabler, MD;  Location: [facility];  Service: Gastroenterology;  Laterality: N/A;  . LUNG BIOPSY N/A 02/27/2015   Procedure: LUNG BIOPSY;  Surgeon: Grace Isaac, MD;  Location: [facility];  Service: Thoracic;  Laterality: N/A;  . right inguinal hernia repair at age 6    . VIDEO BRONCHOSCOPY WITH ENDOBRONCHIAL ULTRASOUND N/A 02/27/2015   Procedure: VIDEO BRONCHOSCOPY WITH ENDOBRONCHIAL ULTRASOUND;  Surgeon: Grace Isaac, MD;  Location: [facility];  Service: Thoracic;  Laterality: N/A;    REVIEW OF SYSTEMS:  A comprehensive review of systems was negative except for: Respiratory: positive  for dyspnea on exertion   PHYSICAL EXAMINATION: General appearance: alert, cooperative and no distress Head: Normocephalic, without obvious abnormality, atraumatic Neck: no adenopathy, no JVD, supple, symmetrical, trachea midline and thyroid not enlarged, symmetric, no tenderness/mass/nodules Lymph nodes: Cervical, supraclavicular, and axillary nodes normal. Resp: clear to auscultation bilaterally Back: symmetric, no curvature. ROM normal. No CVA tenderness. Cardio: regular rate and rhythm, S1, S2 normal, no murmur, click, rub or gallop GI: soft, non-tender; bowel sounds normal; no masses,  no organomegaly Extremities: extremities normal, atraumatic, no cyanosis or edema  ECOG PERFORMANCE STATUS: 1 - Symptomatic but completely ambulatory  Blood pressure 128/73, pulse 96, temperature 98.6 F (37 C), temperature source Tympanic, resp. rate 12, height  5' 9.5" (1.765 m), weight 160 lb 14.4 oz (73 kg), SpO2 100 %.  LABORATORY DATA: Lab Results  Component Value Date   WBC 5.5 07/09/2020   HGB 11.2 (L) 07/09/2020   HCT 34.3 (L) 07/09/2020   MCV 80.5 07/09/2020   PLT 273 07/09/2020      Chemistry      Component Value Date/Time   NA 135 07/09/2020 0940   NA 142 06/25/2017 0819   K 3.6 07/09/2020 0940   K 3.5 06/25/2017 0819   CL 96 (L) 07/09/2020 0940   CO2 30 07/09/2020 0940   CO2 27 06/25/2017 0819   BUN 12 07/09/2020 0940   BUN 8.2 06/25/2017 0819   CREATININE 0.93 07/09/2020 0940   CREATININE 1.1 06/25/2017 0819      Component Value Date/Time   CALCIUM 9.1 07/09/2020 0940   CALCIUM 8.9 06/25/2017 0819   ALKPHOS 94 07/09/2020 0940   ALKPHOS 97 06/25/2017 0819   AST 15 07/09/2020 0940   AST 17 06/25/2017 0819   ALT 9 07/09/2020 0940   ALT 13 06/25/2017 0819   BILITOT 0.5 07/09/2020 0940   BILITOT 0.50 06/25/2017 0819       RADIOGRAPHIC STUDIES: CT Chest W Contrast  Result Date: 07/09/2020 CLINICAL DATA:  Primary Cancer Type: Lung Imaging Indication: Routine  surveillance Interval therapy since last imaging? No Initial Cancer Diagnosis Date: 02/27/2015; Established by: Biopsy-proven Detailed Pathology: Stage IIIA non-small cell lung cancer, squamous cell carcinoma. Primary Tumor location: Right upper lobe. Surgeries: No. Chemotherapy: Yes; Ongoing?  No; Most recent administration: 2016 Immunotherapy? No Radiation therapy? Yes; Date Range: 03/18/2015 - 05/01/2015; Target: Right lung EXAM: CT CHEST WITH CONTRAST TECHNIQUE: Multidetector CT imaging of the chest was performed during intravenous contrast administration. CONTRAST:  49mL OMNIPAQUE IOHEXOL 300 MG/ML  SOLN COMPARISON:  Most recent CT chest 07/07/2019.  02/20/2015 PET-CT. FINDINGS: Cardiovascular: The heart size appears within normal limits. No pericardial effusion identified. Aortic atherosclerosis. Coronary artery calcifications. Mediastinum/Nodes: 1.3 cm nodule arises off the posterior left lobe of thyroid gland, unchanged from previous study. The trachea appears patent and is midline. Normal appearance of the esophagus. No supraclavicular, axillary, mediastinal or hilar adenopathy. Lungs/Pleura: Moderate to severe changes of emphysema. Architectural distortion, fibrosis and volume loss noted within the right upper lobe compatible with changes due to external beam radiation. The appearance is unchanged from previous exam. Unchanged small subpleural nodules overlying the posterior right lower lobe measuring up to 4 mm, image 82/7. 4 mm left upper lobe lung nodule is stable, image 50/7. No new or enlarging pulmonary nodules. Upper Abdomen: Stable bilateral adrenal nodules which are favored to represent benign adenomas. No acute findings. Musculoskeletal: No chest wall abnormality. No acute or significant osseous findings. IMPRESSION: 1. Stable post treatment appearance of the right upper lobe with changes of external beam radiation with volume loss noted in the perihilar right upper lobe extending into the right  apex. 2. Stable small lung nodules consistent with a benign process. 3. Unchanged appearance of bilateral adrenal nodules, likely benign adenomas. 4. Left lobe of thyroid gland nodule measures 1.3 cm. Not clinically significant; no follow-up imaging recommended (ref: J Am Coll Radiol. 2015 Feb;12(2): 143-50). 5. Aortic Atherosclerosis (ICD10-I70.0) and Emphysema (ICD10-J43.9). Coronary artery calcifications. Electronically Signed   By: Kerby Moors M.D.   On: 07/09/2020 10:59    ASSESSMENT AND PLAN:  This is a very pleasant 74 years old African-American male with a stage IIIA non-small cell lung cancer status post a course of  concurrent chemoradiation followed by consolidation chemotherapy with carboplatin and paclitaxel. The patient has been on observation since 2016 and he is doing fine with no concerning complaints. Repeat CT scan of the chest performed recently showed no concerning findings for disease progression. I recommended for the patient to continue on observation with repeat CT scan of the chest in 1 year. The patient was advised to call immediately if he has any concerning symptoms in the interval. The patient voices understanding of current disease status and treatment options and is in agreement with the current care plan. All questions were answered. The patient knows to call the clinic with any problems, questions or concerns. We can certainly see the patient much sooner if necessary.  Disclaimer: This note was dictated with voice recognition software. Similar sounding words can inadvertently be transcribed and may not be corrected upon review.

## 2020-08-14 ENCOUNTER — Other Ambulatory Visit: Payer: Self-pay | Admitting: Urology

## 2020-08-14 DIAGNOSIS — N400 Enlarged prostate without lower urinary tract symptoms: Secondary | ICD-10-CM

## 2021-01-10 ENCOUNTER — Other Ambulatory Visit: Payer: Self-pay

## 2021-01-10 ENCOUNTER — Ambulatory Visit: Payer: Medicare Other | Attending: Internal Medicine

## 2021-01-10 DIAGNOSIS — Z23 Encounter for immunization: Secondary | ICD-10-CM

## 2021-01-10 MED ORDER — PFIZER-BIONT COVID-19 VAC-TRIS 30 MCG/0.3ML IM SUSP
INTRAMUSCULAR | 0 refills | Status: DC
  Filled 2021-01-10: qty 0.3, 1d supply, fill #0

## 2021-01-10 NOTE — Progress Notes (Signed)
   Covid-19 Vaccination Clinic  Name:  Colton Nguyen    MRN: [id number] DOB: 1945-10-19  01/10/2021  [name] was observed post Covid-19 immunization for 15 minutes without incident. He was provided with Vaccine Information Sheet and instruction to access the V-Safe system.   [name] was instructed to call 911 with any severe reactions post vaccine: Marland Kitchen Difficulty breathing  . Swelling of face and throat  . A fast heartbeat  . A bad rash all over body  . Dizziness and weakness   Immunizations Administered    Name Date Dose VIS Date Route   PFIZER Comrnaty(Gray TOP) Covid-19 Vaccine 01/10/2021  9:38 AM 0.3 mL 08/15/2020 Intramuscular   Manufacturer: [employer]   Lot: [id number]   NDC: [phone]

## 2021-03-19 ENCOUNTER — Other Ambulatory Visit (HOSPITAL_COMMUNITY): Payer: Self-pay

## 2021-07-10 ENCOUNTER — Inpatient Hospital Stay: Payer: Medicare Other | Attending: Internal Medicine

## 2021-07-10 ENCOUNTER — Ambulatory Visit (HOSPITAL_COMMUNITY)
Admission: RE | Admit: 2021-07-10 | Discharge: 2021-07-10 | Disposition: A | Discharge status: Home / Self Care | Payer: Medicare Other | Source: Ambulatory Visit | Attending: Internal Medicine | Admitting: Internal Medicine

## 2021-07-10 ENCOUNTER — Other Ambulatory Visit: Payer: Self-pay

## 2021-07-10 DIAGNOSIS — C3490 Malignant neoplasm of unspecified part of unspecified bronchus or lung: Secondary | ICD-10-CM

## 2021-07-10 DIAGNOSIS — Z9981 Dependence on supplemental oxygen: Secondary | ICD-10-CM | POA: Insufficient documentation

## 2021-07-10 DIAGNOSIS — Z923 Personal history of irradiation: Secondary | ICD-10-CM | POA: Insufficient documentation

## 2021-07-10 DIAGNOSIS — R0602 Shortness of breath: Secondary | ICD-10-CM | POA: Insufficient documentation

## 2021-07-10 DIAGNOSIS — Z9221 Personal history of antineoplastic chemotherapy: Secondary | ICD-10-CM | POA: Insufficient documentation

## 2021-07-10 DIAGNOSIS — Z85118 Personal history of other malignant neoplasm of bronchus and lung: Secondary | ICD-10-CM | POA: Insufficient documentation

## 2021-07-10 LAB — CBC WITH DIFFERENTIAL (CANCER CENTER ONLY)
Abs Immature Granulocytes: 0.01 10*3/uL (ref 0.00–0.07)
Basophils Absolute: 0 10*3/uL (ref 0.0–0.1)
Basophils Relative: 0 %
Eosinophils Absolute: 0.1 10*3/uL (ref 0.0–0.5)
Eosinophils Relative: 2 %
HCT: 38.6 % — ABNORMAL LOW (ref 39.0–52.0)
Hemoglobin: 12.8 g/dL — ABNORMAL LOW (ref 13.0–17.0)
Immature Granulocytes: 0 %
Lymphocytes Relative: 27 %
Lymphs Abs: 1.7 10*3/uL (ref 0.7–4.0)
MCH: 27.1 pg (ref 26.0–34.0)
MCHC: 33.2 g/dL (ref 30.0–36.0)
MCV: 81.6 fL (ref 80.0–100.0)
Monocytes Absolute: 0.4 10*3/uL (ref 0.1–1.0)
Monocytes Relative: 6 %
Neutro Abs: 4.1 10*3/uL (ref 1.7–7.7)
Neutrophils Relative %: 65 %
Platelet Count: 249 10*3/uL (ref 150–400)
RBC: 4.73 MIL/uL (ref 4.22–5.81)
RDW: 14.6 % (ref 11.5–15.5)
WBC Count: 6.4 10*3/uL (ref 4.0–10.5)
nRBC: 0 % (ref 0.0–0.2)

## 2021-07-10 LAB — CMP (CANCER CENTER ONLY)
ALT: 11 U/L (ref 0–44)
AST: 17 U/L (ref 15–41)
Albumin: 3.6 g/dL (ref 3.5–5.0)
Alkaline Phosphatase: 84 U/L (ref 38–126)
Anion gap: 8 (ref 5–15)
BUN: 15 mg/dL (ref 8–23)
CO2: 29 mmol/L (ref 22–32)
Calcium: 8.8 mg/dL — ABNORMAL LOW (ref 8.9–10.3)
Chloride: 100 mmol/L (ref 98–111)
Creatinine: 0.89 mg/dL (ref 0.61–1.24)
GFR, Estimated: >60 mL/min (ref >60)
Glucose, Bld: 103 mg/dL — ABNORMAL HIGH (ref 70–99)
Potassium: 4 mmol/L (ref 3.5–5.1)
Sodium: 137 mmol/L (ref 135–145)
Total Bilirubin: 0.4 mg/dL (ref 0.3–1.2)
Total Protein: 6.6 g/dL (ref 6.5–8.1)

## 2021-07-10 MED ORDER — IOHEXOL 350 MG/ML SOLN
75.0000 mL | Freq: Once | INTRAVENOUS | Status: AC | PRN
  Administered 2021-07-10: 60 mL via INTRAVENOUS

## 2021-07-11 ENCOUNTER — Other Ambulatory Visit: Payer: Medicare Other

## 2021-07-14 ENCOUNTER — Inpatient Hospital Stay (HOSPITAL_BASED_OUTPATIENT_CLINIC_OR_DEPARTMENT_OTHER): Payer: Medicare Other | Admitting: Internal Medicine

## 2021-07-14 ENCOUNTER — Other Ambulatory Visit: Payer: Self-pay

## 2021-07-14 VITALS — BP 137/70 | HR 94 | Temp 98.0°F | Resp 20 | Ht 69.5 in | Wt 168.9 lb

## 2021-07-14 DIAGNOSIS — C3490 Malignant neoplasm of unspecified part of unspecified bronchus or lung: Secondary | ICD-10-CM

## 2021-07-14 DIAGNOSIS — Z85118 Personal history of other malignant neoplasm of bronchus and lung: Secondary | ICD-10-CM | POA: Diagnosis present

## 2021-07-14 DIAGNOSIS — C3411 Malignant neoplasm of upper lobe, right bronchus or lung: Secondary | ICD-10-CM | POA: Diagnosis not present

## 2021-07-14 DIAGNOSIS — Z923 Personal history of irradiation: Secondary | ICD-10-CM | POA: Diagnosis not present

## 2021-07-14 DIAGNOSIS — Z9221 Personal history of antineoplastic chemotherapy: Secondary | ICD-10-CM | POA: Diagnosis not present

## 2021-07-14 DIAGNOSIS — R0602 Shortness of breath: Secondary | ICD-10-CM | POA: Diagnosis not present

## 2021-07-14 DIAGNOSIS — Z9981 Dependence on supplemental oxygen: Secondary | ICD-10-CM | POA: Diagnosis not present

## 2021-07-14 NOTE — Progress Notes (Signed)
[facility] [phone]   [phone]  OFFICE PROGRESS NOTE  [name], MD [place] [address]  DIAGNOSIS: Stage IIIA (T3, N2, M0) non-small cell lung cancer, squamous cell carcinoma diagnosed in June 2016.  PRIOR THERAPY:  1) Concurrent chemoradiation with weekly carboplatin for an AUC of 2 and paclitaxel 45 mg/m2 with partial response.  2) Consolidation systemic chemotherapy with carboplatin for AUC of 5 and paclitaxel 175 MG/M2 every 3 weeks. First cycle 06/10/2015. Status post 3 cycles.  CURRENT THERAPY: Observation.  INTERVAL HISTORY: Colton Nguyen 75 y.o. male returns to the clinic today for annual follow-up visit.  The patient is feeling fine today with no concerning complaints except for the baseline shortness of breath and he is currently on home oxygen.  He denied having any chest pain, cough or hemoptysis.  He denied having any fever or chills.  He has no nausea, vomiting, diarrhea or constipation.  He has no headache or visual changes.  He had repeat CT scan of the chest performed recently and he is here for evaluation and discussion of his scan results.   MEDICAL HISTORY: Past Medical History:  Diagnosis Date   Anxiety    situational   Arthritis    BPH (benign prostatic hyperplasia)    CAD (coronary artery disease)    CHF (congestive heart failure) ([facility])    pt denies 11/13/2019   Chronic cardiopulmonary disease (HCC)    COPD (chronic obstructive pulmonary disease) ([place])    History of kidney stones    Hyperlipidemia    Hypertension    Hypertension 12/28/2016   lung ca dx'd 03/2015   Mass of lung    Nicotine dependence    Personal history of kidney stones    Restless leg    Shortness of breath dyspnea    with exertion   Tuberculosis    Wears glasses     ALLERGIES:  has No Known Allergies.  MEDICATIONS:  Current Outpatient Medications  Medication Sig Dispense Refill   acetaminophen (TYLENOL)  500 MG tablet Take 1,000 mg by mouth every 6 (six) hours as needed for moderate pain or headache.     albuterol (VENTOLIN HFA) 108 (90 Base) MCG/ACT inhaler Inhale 1 puff into the lungs every 6 (six) hours as needed for wheezing or shortness of breath.     amLODipine (NORVASC) 10 MG tablet Take 10 mg by mouth daily.      aspirin 81 MG tablet Take 81 mg by mouth daily.     atorvastatin (LIPITOR) 40 MG tablet Take 40 mg by mouth daily.     Cholecalciferol (VITAMIN D3) 5000 UNITS TABS Take 5,000 Units by mouth daily.     COVID-19 mRNA Vac-TriS, Pfizer, (PFIZER-BIONT COVID-19 VAC-TRIS) SUSP injection Inject into the muscle. 0.3 mL 0   ferrous sulfate 325 (65 FE) MG tablet Take 325 mg by mouth daily with breakfast.      finasteride (PROSCAR) 5 MG tablet Take 1 tablet (5 mg total) by mouth daily. 30 tablet 11   fluticasone (FLONASE) 50 MCG/ACT nasal spray Place 2 sprays into both nostrils daily as needed for allergies.      gabapentin (NEURONTIN) 100 MG capsule Take 100 mg by mouth 2 (two) times daily.     HYDROcodone-acetaminophen (NORCO/VICODIN) 5-325 MG tablet Take 1 tablet by mouth every 6 (six) hours as needed for moderate pain.     Lidocaine 4 % PTCH Apply 1 patch topically daily  as needed (pain).      LORazepam (ATIVAN) 0.5 MG tablet Take 0.5 mg by mouth daily as needed for anxiety.     losartan-hydrochlorothiazide (HYZAAR) 100-12.5 MG tablet Take 1 tablet by mouth daily.     methocarbamol (ROBAXIN) 500 MG tablet Take 1,000 mg by mouth every 6 (six) hours as needed for muscle spasms.     oxyCODONE (OXY IR/ROXICODONE) 5 MG immediate release tablet Take 1 tablet (5 mg total) by mouth every 6 (six) hours as needed for moderate pain or severe pain ((score 4 to 6)). 30 tablet 0   SYMBICORT 160-4.5 MCG/ACT inhaler Inhale 2 puffs into the lungs 2 (two) times daily.      tamsulosin (FLOMAX) 0.4 MG CAPS capsule Take 1 capsule (0.4 mg total) by mouth daily. 30 capsule 11   Tiotropium Bromide Monohydrate  (SPIRIVA RESPIMAT) 2.5 MCG/ACT AERS Inhale 1 puff into the lungs daily.     traMADol (ULTRAM) 50 MG tablet Take 50 mg by mouth every 12 (twelve) hours as needed for moderate pain.      No current facility-administered medications for this visit.    SURGICAL HISTORY:  Past Surgical History:  Procedure Laterality Date   BACK SURGERY     BIOPSY  01/25/2020   Procedure: BIOPSY;  Surgeon: Doran Stabler, MD;  Location: [facility];  Service: Gastroenterology;;   CAD CCTA 01/31/15     CERVICAL FUSION     COLONOSCOPY     COLONOSCOPY WITH PROPOFOL N/A 01/25/2020   Procedure: COLONOSCOPY WITH PROPOFOL;  Surgeon: Doran Stabler, MD;  Location: [facility];  Service: Gastroenterology;  Laterality: N/A;   ESOPHAGOGASTRODUODENOSCOPY (EGD) WITH PROPOFOL N/A 01/25/2020   Procedure: ESOPHAGOGASTRODUODENOSCOPY (EGD) WITH PROPOFOL;  Surgeon: Doran Stabler, MD;  Location: [facility];  Service: Gastroenterology;  Laterality: N/A;   FRACTURE SURGERY     left ankle   HEMOSTASIS CLIP PLACEMENT  01/25/2020   Procedure: HEMOSTASIS CLIP PLACEMENT;  Surgeon: Doran Stabler, MD;  Location: [facility];  Service: Gastroenterology;;   HOT HEMOSTASIS N/A 01/25/2020   Procedure: HOT HEMOSTASIS (ARGON PLASMA COAGULATION/BICAP);  Surgeon: Doran Stabler, MD;  Location: [facility];  Service: Gastroenterology;  Laterality: N/A;   LUNG BIOPSY N/A 02/27/2015   Procedure: LUNG BIOPSY;  Surgeon: Grace Isaac, MD;  Location: [facility];  Service: Thoracic;  Laterality: N/A;   right inguinal hernia repair at age 62     [place] N/A 02/27/2015   Procedure: [place];  Surgeon: Grace Isaac, MD;  Location: [facility];  Service: Thoracic;  Laterality: N/A;    REVIEW OF SYSTEMS:  A comprehensive review of systems was negative except for: Respiratory: positive for dyspnea on exertion   PHYSICAL EXAMINATION: General appearance:  alert, cooperative and no distress Head: Normocephalic, without obvious abnormality, atraumatic Neck: no adenopathy, no JVD, supple, symmetrical, trachea midline and thyroid not enlarged, symmetric, no tenderness/mass/nodules Lymph nodes: Cervical, supraclavicular, and axillary nodes normal. Resp: clear to auscultation bilaterally Back: symmetric, no curvature. ROM normal. No CVA tenderness. Cardio: regular rate and rhythm, S1, S2 normal, no murmur, click, rub or gallop GI: soft, non-tender; bowel sounds normal; no masses,  no organomegaly Extremities: extremities normal, atraumatic, no cyanosis or edema  ECOG PERFORMANCE STATUS: 1 - Symptomatic but completely ambulatory  Blood pressure 137/70, pulse 94, temperature 98 F (36.7 C), temperature source Tympanic, resp. rate 20, height 5' 9.5" (1.765 m), weight 168 lb 14.4  oz (76.6 kg), SpO2 93 %.  LABORATORY DATA: Lab Results  Component Value Date   WBC 6.4 07/10/2021   HGB 12.8 (L) 07/10/2021   HCT 38.6 (L) 07/10/2021   MCV 81.6 07/10/2021   PLT 249 07/10/2021      Chemistry      Component Value Date/Time   NA 137 07/10/2021 1548   NA 142 06/25/2017 0819   K 4.0 07/10/2021 1548   K 3.5 06/25/2017 0819   CL 100 07/10/2021 1548   CO2 29 07/10/2021 1548   CO2 27 06/25/2017 0819   BUN 15 07/10/2021 1548   BUN 8.2 06/25/2017 0819   CREATININE 0.89 07/10/2021 1548   CREATININE 1.1 06/25/2017 0819      Component Value Date/Time   CALCIUM 8.8 (L) 07/10/2021 1548   CALCIUM 8.9 06/25/2017 0819   ALKPHOS 84 07/10/2021 1548   ALKPHOS 97 06/25/2017 0819   AST 17 07/10/2021 1548   AST 17 06/25/2017 0819   ALT 11 07/10/2021 1548   ALT 13 06/25/2017 0819   BILITOT 0.4 07/10/2021 1548   BILITOT 0.50 06/25/2017 0819       RADIOGRAPHIC STUDIES: CT Chest W Contrast  Result Date: 07/11/2021 CLINICAL DATA:  Primary Cancer Type: Lung Imaging Indication: Routine surveillance Interval therapy since last imaging? No Initial Cancer  Diagnosis Date: 02/27/2015; Established by: Biopsy-proven Detailed Pathology: Stage IIIA non-small cell lung cancer, squamous cell carcinoma. Primary Tumor location: Right upper lobe. Surgeries: No thoracic. Chemotherapy: Yes; Ongoing?  No; Most recent administration: 2016 Immunotherapy? No Radiation therapy? Yes; Date Range: 03/18/2015 - 05/01/2015; Target: Right lung EXAM: CT CHEST WITH CONTRAST TECHNIQUE: Multidetector CT imaging of the chest was performed during intravenous contrast administration. CONTRAST:  47mL OMNIPAQUE IOHEXOL 350 MG/ML SOLN COMPARISON:  Most recent CT chest 07/09/2020.  02/20/2015 PET-CT. FINDINGS: Cardiovascular: Heart size is normal. There is no significant pericardial fluid, thickening or pericardial calcification. There is aortic atherosclerosis, as well as atherosclerosis of the great vessels of the mediastinum and the coronary arteries, including calcified atherosclerotic plaque in the left main, left anterior descending and right coronary arteries. Mediastinum/Nodes: No pathologically enlarged mediastinal or hilar lymph nodes. Exophytic nodule extending off the posterior aspect of the left lobe of the thyroid gland measuring 2.0 x 1.5 cm (axial image 32 of series 2), similar to numerous prior examinations. Esophagus is unremarkable in appearance. No axillary lymphadenopathy. Lungs/Pleura: Chronic extensive cylindrical traction bronchiectasis in the medial aspect of the right upper lobe where there is extensive thickening of the peribronchovascular interstitium, regional architectural distortion and volume loss, and chronic thick-walled cavity, similar to prior studies, most compatible with chronic postradiation mass-like fibrosis. 4 mm left upper lobe pulmonary nodule (axial image 53 of series 5), stable. No other new suspicious appearing pulmonary nodules or masses are noted. No acute consolidative airspace disease. No pleural effusions. Diffuse bronchial wall thickening with  moderate to severe centrilobular and paraseptal emphysema. Upper Abdomen: Nodular thickening of the adrenal glands bilaterally, similar to prior examinations, presumably reflective of adrenal hyperplasia, likely with small benign nodules such as adrenal adenomas. Musculoskeletal: There are no aggressive appearing lytic or blastic lesions noted in the visualized portions of the skeleton. IMPRESSION: 1. Chronic postradiation changes of mass-like fibrosis in the right upper lobe, stable compared to the prior examination, as above. No findings to suggest locally recurrent disease or definite metastatic disease in the thorax. 2. Diffuse bronchial wall thickening with moderate to severe centrilobular and paraseptal emphysema; imaging findings suggestive of underlying COPD. 3.  Aortic atherosclerosis, in addition to left main and 2 vessel coronary artery disease. Assessment for potential risk factor modification, dietary therapy or pharmacologic therapy may be warranted, if clinically indicated. Electronically Signed   By: Vinnie Langton M.D.   On: 07/11/2021 10:39     ASSESSMENT AND PLAN:  This is a very pleasant 75 years old African-American male with a stage IIIA non-small cell lung cancer status post a course of concurrent chemoradiation followed by consolidation chemotherapy with carboplatin and paclitaxel. The patient has been in observation since 2016 and he is feeling fine today with no concerning complaints except for the baseline shortness of breath. He had repeat CT scan of the chest performed recently.  I personally and independently reviewed the scan and discussed the results with the patient. His scan showed no concerning findings for disease recurrence or metastasis. I recommended for the patient to continue on observation with repeat CT scan of the chest in 1 year. The patient was advised to call immediately if he has any concerning symptoms in the interval. The patient voices understanding of  current disease status and treatment options and is in agreement with the current care plan. All questions were answered. The patient knows to call the clinic with any problems, questions or concerns. We can certainly see the patient much sooner if necessary.  Disclaimer: This note was dictated with voice recognition software. Similar sounding words can inadvertently be transcribed and may not be corrected upon review.

## 2021-08-08 ENCOUNTER — Other Ambulatory Visit: Payer: Self-pay

## 2021-08-08 ENCOUNTER — Telehealth: Payer: Self-pay | Admitting: Urology

## 2021-08-08 ENCOUNTER — Ambulatory Visit: Payer: Medicare Other | Admitting: Urology

## 2021-08-08 NOTE — Telephone Encounter (Signed)
Pt came to appt on [facility]'s schedule for PSA check.  Your last note said not to check PSA anymore, but pt needs med refill, so we are scheduling him an appt with you on Monday.  Can you fill without an appt, or does he need to be seen?  Please advise.

## 2021-08-11 ENCOUNTER — Ambulatory Visit (INDEPENDENT_AMBULATORY_CARE_PROVIDER_SITE_OTHER): Payer: Medicare Other | Admitting: Urology

## 2021-08-11 ENCOUNTER — Other Ambulatory Visit: Payer: Self-pay

## 2021-08-11 ENCOUNTER — Encounter: Payer: Self-pay | Admitting: Urology

## 2021-08-11 DIAGNOSIS — N400 Enlarged prostate without lower urinary tract symptoms: Secondary | ICD-10-CM

## 2021-08-11 MED ORDER — FINASTERIDE 5 MG PO TABS: 5.0000 mg | ORAL_TABLET | Freq: Every day | ORAL | 3 refills | Status: DC

## 2021-08-11 MED ORDER — TAMSULOSIN HCL 0.4 MG PO CAPS: 0.4000 mg | ORAL_CAPSULE | Freq: Every day | ORAL | 3 refills | Status: DC

## 2021-08-11 NOTE — Progress Notes (Signed)
   08/11/2021 4:50 PM   Colton Nguyen Oct 28, 1945 [id number]  Reason for visit: BPH  HPI: Comorbid 75 year old male with long history of mild BPH and urinary symptoms well controlled on maximal medical therapy with Flomax and finasteride.  He really denies any urinary complaints today.  He has a number of comorbidities including stage III non-small cell lung cancer treated with chemotherapy and radiation, CAD, congestive heart failure, and oxygen use.  He also has a history of mildly elevated PSA that was downtrending and relatively stable, and with his age and comorbidities he deferred further work-up and ongoing PSA screening which is very reasonable, and he also refused DRE.  At our last visit in December 2020 he requested to have his BPH medications filled by his PCP since his symptoms have been stable long-term.  I agreed with this, however for some reason he was referred to our clinic for refills of BPH medications.  I again discussed with him that I am very comfortable with these medications being filled by his PCP, but I am happy to see him yearly or if new problems arise.  Flomax and finasteride were refilled, can be filled by PCP moving forward, follow-up with urology as needed Would not recommend further PSA screening with his age and comorbidities  Billey Co, MD  [facility] [address] [place], [place] [address] [phone]

## 2022-07-08 ENCOUNTER — Other Ambulatory Visit: Payer: Medicare Other

## 2022-07-09 ENCOUNTER — Other Ambulatory Visit: Payer: Self-pay

## 2022-07-09 ENCOUNTER — Ambulatory Visit (HOSPITAL_COMMUNITY)
Admission: RE | Admit: 2022-07-09 | Discharge: 2022-07-09 | Disposition: A | Discharge status: Home / Self Care | Payer: Medicare Other | Source: Ambulatory Visit | Attending: Internal Medicine | Admitting: Internal Medicine

## 2022-07-09 ENCOUNTER — Inpatient Hospital Stay: Payer: Medicare Other | Attending: Internal Medicine

## 2022-07-09 DIAGNOSIS — C3490 Malignant neoplasm of unspecified part of unspecified bronchus or lung: Secondary | ICD-10-CM

## 2022-07-09 DIAGNOSIS — Z85118 Personal history of other malignant neoplasm of bronchus and lung: Secondary | ICD-10-CM | POA: Diagnosis present

## 2022-07-09 LAB — CBC WITH DIFFERENTIAL (CANCER CENTER ONLY)
Abs Immature Granulocytes: 0.01 10*3/uL (ref 0.00–0.07)
Basophils Absolute: 0 10*3/uL (ref 0.0–0.1)
Basophils Relative: 1 %
Eosinophils Absolute: 0.1 10*3/uL (ref 0.0–0.5)
Eosinophils Relative: 2 %
HCT: 38.9 % — ABNORMAL LOW (ref 39.0–52.0)
Hemoglobin: 13 g/dL (ref 13.0–17.0)
Immature Granulocytes: 0 %
Lymphocytes Relative: 23 %
Lymphs Abs: 1.5 10*3/uL (ref 0.7–4.0)
MCH: 27.5 pg (ref 26.0–34.0)
MCHC: 33.4 g/dL (ref 30.0–36.0)
MCV: 82.4 fL (ref 80.0–100.0)
Monocytes Absolute: 0.4 10*3/uL (ref 0.1–1.0)
Monocytes Relative: 6 %
Neutro Abs: 4.7 10*3/uL (ref 1.7–7.7)
Neutrophils Relative %: 68 %
Platelet Count: 249 10*3/uL (ref 150–400)
RBC: 4.72 MIL/uL (ref 4.22–5.81)
RDW: 14.6 % (ref 11.5–15.5)
WBC Count: 6.8 10*3/uL (ref 4.0–10.5)
nRBC: 0 % (ref 0.0–0.2)

## 2022-07-09 LAB — CMP (CANCER CENTER ONLY)
ALT: 10 U/L (ref 0–44)
AST: 16 U/L (ref 15–41)
Albumin: 4 g/dL (ref 3.5–5.0)
Alkaline Phosphatase: 80 U/L (ref 38–126)
Anion gap: 6 (ref 5–15)
BUN: 14 mg/dL (ref 8–23)
CO2: 33 mmol/L — ABNORMAL HIGH (ref 22–32)
Calcium: 9.2 mg/dL (ref 8.9–10.3)
Chloride: 98 mmol/L (ref 98–111)
Creatinine: 0.97 mg/dL (ref 0.61–1.24)
GFR, Estimated: >60 mL/min (ref >60)
Glucose, Bld: 97 mg/dL (ref 70–99)
Potassium: 4 mmol/L (ref 3.5–5.1)
Sodium: 137 mmol/L (ref 135–145)
Total Bilirubin: 0.4 mg/dL (ref 0.3–1.2)
Total Protein: 7 g/dL (ref 6.5–8.1)

## 2022-07-09 MED ORDER — SODIUM CHLORIDE (PF) 0.9 % IJ SOLN
INTRAMUSCULAR | Status: AC
  Filled 2022-07-09: qty 50

## 2022-07-09 MED ORDER — IOHEXOL 300 MG/ML  SOLN
75.0000 mL | Freq: Once | INTRAMUSCULAR | Status: AC | PRN
  Administered 2022-07-09: 75 mL via INTRAVENOUS

## 2022-07-10 ENCOUNTER — Telehealth: Payer: Self-pay

## 2022-07-10 NOTE — Telephone Encounter (Signed)
Patient called requesting 10 AM apt on 07/13/22 be changed to phone visit.

## 2022-07-13 ENCOUNTER — Inpatient Hospital Stay (HOSPITAL_BASED_OUTPATIENT_CLINIC_OR_DEPARTMENT_OTHER): Payer: Medicare Other | Admitting: Internal Medicine

## 2022-07-13 DIAGNOSIS — C3490 Malignant neoplasm of unspecified part of unspecified bronchus or lung: Secondary | ICD-10-CM | POA: Diagnosis not present

## 2022-07-13 NOTE — Progress Notes (Signed)
[facility] [phone]   Fax:([phone]) [phone]  PROGRESS NOTE FOR TELEMEDICINE VISITS  [name], MD [name] [address]  I connected withNAME@ on 07/13/22 at 10:00 AM EST by telephone visit and verified that I am speaking with the correct person using two identifiers.   I discussed the limitations, risks, security and privacy concerns of performing an evaluation and management service by telemedicine and the availability of in-person appointments. I also discussed with the patient that there may be a patient responsible charge related to this service. The patient expressed understanding and agreed to proceed.  Other persons participating in the visit and their role in the encounter:  None  Patient's location: Home Provider's location: [place]  DIAGNOSIS: Stage IIIA (T3, N2, M0) non-small cell lung cancer, squamous cell carcinoma diagnosed in June 2016.   PRIOR THERAPY:  1) Concurrent chemoradiation with weekly carboplatin for an AUC of 2 and paclitaxel 45 mg/m2 with partial response.  2) Consolidation systemic chemotherapy with carboplatin for AUC of 5 and paclitaxel 175 MG/M2 every 3 weeks. First cycle 06/10/2015. Status post 3 cycles.   CURRENT THERAPY: Observation.  INTERVAL HISTORY: Colton Nguyen 76 y.o. male has a telephone virtual follow-up visit with me today for evaluation and recommendation regarding his condition.  The patient is feeling fine today with no concerning complaints.  He has no chest pain, shortness of breath, cough or hemoptysis.  He has no nausea, vomiting, diarrhea or constipation.  He has no headache or visual changes.  He denied having any recent weight loss or night sweats.  He had repeat CT scan of the chest performed recently and he is here for evaluation and discussion of his discuss results.  MEDICAL HISTORY: Past Medical History:  Diagnosis Date   Anxiety    situational    Arthritis    BPH (benign prostatic hyperplasia)    CAD (coronary artery disease)    CHF (congestive heart failure) ([place] [facility])    pt denies 11/13/2019   Chronic cardiopulmonary disease (HCC)    COPD (chronic obstructive pulmonary disease) ([facility])    History of kidney stones    Hyperlipidemia    Hypertension    Hypertension 12/28/2016   lung ca dx'd 03/2015   Mass of lung    Nicotine dependence    Personal history of kidney stones    Restless leg    Shortness of breath dyspnea    with exertion   Tuberculosis    Wears glasses     ALLERGIES:  has No Known Allergies.  MEDICATIONS:  Current Outpatient Medications  Medication Sig Dispense Refill   acetaminophen (TYLENOL) 500 MG tablet Take 1,000 mg by mouth every 6 (six) hours as needed for moderate pain or headache.     albuterol (VENTOLIN HFA) 108 (90 Base) MCG/ACT inhaler Inhale 1 puff into the lungs every 6 (six) hours as needed for wheezing or shortness of breath.     amLODipine (NORVASC) 10 MG tablet Take 10 mg by mouth daily.      aspirin 81 MG tablet Take 81 mg by mouth daily.     atorvastatin (LIPITOR) 40 MG tablet Take 40 mg by mouth daily.     Cholecalciferol (VITAMIN D3) 5000 UNITS TABS Take 5,000 Units by mouth daily.     COVID-19 mRNA Vac-TriS, Pfizer, (PFIZER-BIONT COVID-19 VAC-TRIS) SUSP injection Inject into the muscle. 0.3 mL 0   ferrous sulfate 325 (65 FE) MG tablet Take 325 mg by  mouth daily with breakfast.      finasteride (PROSCAR) 5 MG tablet Take 1 tablet (5 mg total) by mouth daily. 90 tablet 3   fluticasone (FLONASE) 50 MCG/ACT nasal spray Place 2 sprays into both nostrils daily as needed for allergies.      gabapentin (NEURONTIN) 100 MG capsule Take 100 mg by mouth 2 (two) times daily.     HYDROcodone-acetaminophen (NORCO/VICODIN) 5-325 MG tablet Take 1 tablet by mouth every 6 (six) hours as needed for moderate pain.     Lidocaine 4 % PTCH Apply 1 patch topically daily as needed (pain).      LORazepam (ATIVAN) 0.5 MG  tablet Take 0.5 mg by mouth daily as needed for anxiety.     losartan-hydrochlorothiazide (HYZAAR) 100-12.5 MG tablet Take 1 tablet by mouth daily.     methocarbamol (ROBAXIN) 500 MG tablet Take 1,000 mg by mouth every 6 (six) hours as needed for muscle spasms.     SYMBICORT 160-4.5 MCG/ACT inhaler Inhale 2 puffs into the lungs 2 (two) times daily.      tamsulosin (FLOMAX) 0.4 MG CAPS capsule Take 1 capsule (0.4 mg total) by mouth daily. 90 capsule 3   Tiotropium Bromide Monohydrate (SPIRIVA RESPIMAT) 2.5 MCG/ACT AERS Inhale 1 puff into the lungs daily.     traMADol (ULTRAM) 50 MG tablet Take 50 mg by mouth every 12 (twelve) hours as needed for moderate pain.      No current facility-administered medications for this visit.    SURGICAL HISTORY:  Past Surgical History:  Procedure Laterality Date   BACK SURGERY     BIOPSY  01/25/2020   Procedure: BIOPSY;  Surgeon: Doran Stabler, MD;  Location: [facility];  Service: Gastroenterology;;   CAD CCTA 01/31/15     CERVICAL FUSION     COLONOSCOPY     COLONOSCOPY WITH PROPOFOL N/A 01/25/2020   Procedure: COLONOSCOPY WITH PROPOFOL;  Surgeon: Doran Stabler, MD;  Location: [facility];  Service: Gastroenterology;  Laterality: N/A;   ESOPHAGOGASTRODUODENOSCOPY (EGD) WITH PROPOFOL N/A 01/25/2020   Procedure: ESOPHAGOGASTRODUODENOSCOPY (EGD) WITH PROPOFOL;  Surgeon: Doran Stabler, MD;  Location: [facility];  Service: Gastroenterology;  Laterality: N/A;   FRACTURE SURGERY     left ankle   HEMOSTASIS CLIP PLACEMENT  01/25/2020   Procedure: HEMOSTASIS CLIP PLACEMENT;  Surgeon: Doran Stabler, MD;  Location: [facility];  Service: Gastroenterology;;   HOT HEMOSTASIS N/A 01/25/2020   Procedure: HOT HEMOSTASIS (ARGON PLASMA COAGULATION/BICAP);  Surgeon: Doran Stabler, MD;  Location: [facility];  Service: Gastroenterology;  Laterality: N/A;   LUNG BIOPSY N/A 02/27/2015   Procedure: LUNG BIOPSY;  Surgeon: Grace Isaac, MD;   Location: [facility];  Service: Thoracic;  Laterality: N/A;   right inguinal hernia repair at age 70     [place] N/A 02/27/2015   Procedure: [place];  Surgeon: Grace Isaac, MD;  Location: [facility];  Service: Thoracic;  Laterality: N/A;    REVIEW OF SYSTEMS:  A comprehensive review of systems was negative.    LABORATORY DATA: Lab Results  Component Value Date   WBC 6.8 07/09/2022   HGB 13.0 07/09/2022   HCT 38.9 (L) 07/09/2022   MCV 82.4 07/09/2022   PLT 249 07/09/2022      Chemistry      Component Value Date/Time   NA 137 07/09/2022 1424   NA 142 06/25/2017 0819   K 4.0 07/09/2022 1424  K 3.5 06/25/2017 0819   CL 98 07/09/2022 1424   CO2 33 (H) 07/09/2022 1424   CO2 27 06/25/2017 0819   BUN 14 07/09/2022 1424   BUN 8.2 06/25/2017 0819   CREATININE 0.97 07/09/2022 1424   CREATININE 1.1 06/25/2017 0819      Component Value Date/Time   CALCIUM 9.2 07/09/2022 1424   CALCIUM 8.9 06/25/2017 0819   ALKPHOS 80 07/09/2022 1424   ALKPHOS 97 06/25/2017 0819   AST 16 07/09/2022 1424   AST 17 06/25/2017 0819   ALT 10 07/09/2022 1424   ALT 13 06/25/2017 0819   BILITOT 0.4 07/09/2022 1424   BILITOT 0.50 06/25/2017 0819       RADIOGRAPHIC STUDIES: CT Chest W Contrast  Result Date: 07/12/2022 CLINICAL DATA:  Non-small cell lung cancer restaging * Tracking Code: BO * EXAM: CT CHEST WITH CONTRAST TECHNIQUE: Multidetector CT imaging of the chest was performed during intravenous contrast administration. RADIATION DOSE REDUCTION: This exam was performed according to the departmental dose-optimization program which includes automated exposure control, adjustment of the mA and/or kV according to patient size and/or use of iterative reconstruction technique. CONTRAST:  57mL OMNIPAQUE IOHEXOL 300 MG/ML  SOLN COMPARISON:  07/10/2021 FINDINGS: Cardiovascular: Aortic atherosclerosis. Normal heart size. Left and  right coronary artery calcifications. No pericardial effusion. Mediastinum/Nodes: No enlarged mediastinal, hilar, or axillary lymph nodes. Unchanged multinodular goiter stable for greater than 5 years and benign. Stability for greater than 5 years implies benignity; no biopsy or followup indicated (ref: J Am Coll Radiol. 2015 Feb;12(2): 143-50). Trachea, and esophagus demonstrate no significant findings. Lungs/Pleura: Unchanged post treatment appearance of the suprahilar right lung, with extensive volume loss and fibrotic consolidation (series 5, image 39). Stable, benign 0.5 cm nodule of the peripheral left upper lobe (series 5, image 59). Severe emphysema. No pleural effusion or pneumothorax. Upper Abdomen: No acute abnormality. Unchanged, benign adenomatous nodularity of the bilateral adrenal glands, for which no specific further follow-up or characterization is required. Unchanged, benign flash filling hemangioma or focal nodular hyperplasia of the posterior right lobe of the liver (series 2, image 142). Musculoskeletal: No chest wall abnormality. No acute osseous findings. IMPRESSION: 1. Unchanged post treatment appearance of the suprahilar right lung, with extensive volume loss and fibrotic consolidation. 2. No evidence of recurrent or metastatic disease in the chest. 3. Severe emphysema. 4. Coronary artery disease. Aortic Atherosclerosis (ICD10-I70.0) and Emphysema (ICD10-J43.9). Electronically Signed   By: Delanna Ahmadi M.D.   On: 07/12/2022 15:30    ASSESSMENT AND PLAN: This is a very pleasant 76 years old African-American male with a stage IIIA non-small cell lung cancer status post a course of concurrent chemoradiation followed by consolidation chemotherapy with carboplatin and paclitaxel.  The patient has been in observation since the completion of his treatment and January 2017. He had repeat CT scan of the chest performed recently.  I personally and independently reviewed the scan and discussed the  result with the patient today. His scan showed no concerning findings for disease progression. He is currently asymptomatic. I recommended for him to continue on observation with repeat CT scan of the chest in 1 year. He was advised to call immediately if he has any other concerning symptoms in the interval. I discussed the assessment and treatment plan with the patient. The patient was provided an opportunity to ask questions and all were answered. The patient agreed with the plan and demonstrated an understanding of the instructions.   The patient was advised to call back  or seek an in-person evaluation if the symptoms worsen or if the condition fails to improve as anticipated.  I provided 15 minutes of non face-to-face telephone visit time during this encounter, and > 50% was spent counseling as documented under my assessment & plan.  Eilleen Kempf, MD 07/13/2022 8:44 AM  Disclaimer: This note was dictated with voice recognition software. Similar sounding words can inadvertently be transcribed and may not be corrected upon review.

## 2022-07-14 ENCOUNTER — Other Ambulatory Visit: Payer: Medicare Other

## 2022-07-14 ENCOUNTER — Other Ambulatory Visit: Payer: Self-pay

## 2022-07-14 DIAGNOSIS — N400 Enlarged prostate without lower urinary tract symptoms: Secondary | ICD-10-CM

## 2022-07-15 ENCOUNTER — Other Ambulatory Visit: Payer: Medicare Other

## 2022-07-15 ENCOUNTER — Ambulatory Visit: Payer: Medicare Other | Admitting: Urology

## 2022-07-15 DIAGNOSIS — N400 Enlarged prostate without lower urinary tract symptoms: Secondary | ICD-10-CM

## 2022-07-16 LAB — PSA: Prostate Specific Ag, Serum: 7.1 ng/mL — ABNORMAL HIGH (ref 0.0–4.0)

## 2022-07-22 ENCOUNTER — Encounter: Payer: Self-pay | Admitting: Urology

## 2022-07-22 ENCOUNTER — Ambulatory Visit (INDEPENDENT_AMBULATORY_CARE_PROVIDER_SITE_OTHER): Payer: Medicare Other | Admitting: Urology

## 2022-07-22 VITALS — BP 122/74 | HR 102 | Ht 71.0 in | Wt 168.0 lb

## 2022-07-22 DIAGNOSIS — N400 Enlarged prostate without lower urinary tract symptoms: Secondary | ICD-10-CM

## 2022-07-22 DIAGNOSIS — Z125 Encounter for screening for malignant neoplasm of prostate: Secondary | ICD-10-CM | POA: Diagnosis not present

## 2022-07-22 MED ORDER — FINASTERIDE 5 MG PO TABS: 5.0000 mg | ORAL_TABLET | Freq: Every day | ORAL | 3 refills | Status: DC

## 2022-07-22 MED ORDER — TAMSULOSIN HCL 0.4 MG PO CAPS: 0.4000 mg | ORAL_CAPSULE | Freq: Every day | ORAL | 3 refills | Status: DC

## 2022-07-22 NOTE — Progress Notes (Signed)
   07/22/2022 11:31 AM   Colton Nguyen [birth year] [id number]  Reason for visit: BPH, PSA screening  HPI: Comorbid 76 year old male with long history of BPH and urinary symptoms well controlled on maximal medical therapy with Flomax and finasteride.  He really denies any urinary complaints today, and symptoms have not changed since the last year.    He has a number of comorbidities including stage III non-small cell lung cancer treated with chemotherapy and radiation, CAD, congestive heart failure, and oxygen use.  He also has a history of mildly elevated PSA that was downtrending and relatively stable, and with his age and comorbidities he deferred further work-up and ongoing PSA screening which is very reasonable, and he also refused DRE.  Despite our recommendations again screening, his PCP is continue to check PSA, most recently 7.1 on 07/15/2022.  This is essentially stable from 5.1 in November 2019, 6.1 in May 2019, 6.5 in February 2019, and decreased from 20.6 in January 2019.  We again had a long conversation about the risk and benefits of PSA screening, and with his stable PSA over the last 5 years, numerous comorbidities, and age over 25, I strongly discouraged him from ongoing PSA screening.  We discussed other alternatives like a prostate biopsy or prostate MRI, but he defers.  He has previously asked if his BPH medications can be filled by PCP moving forward, and I am comfortable with this, however he continues to be rereferred to urology.  -Flomax and finasteride were refilled, can be filled by PCP moving forward, follow-up with urology as needed -Would not recommend further PSA screening with his age and comorbidities  Billey Co, MD  [facility] [address] [place], [place] [address] [phone]

## 2022-07-22 NOTE — Patient Instructions (Signed)
Can see Dr. Humphrey Rolls for refills, do not check PSA

## 2022-10-13 ENCOUNTER — Other Ambulatory Visit: Payer: Self-pay | Admitting: Family

## 2022-10-19 ENCOUNTER — Other Ambulatory Visit: Payer: Self-pay | Admitting: Family

## 2022-11-14 ENCOUNTER — Other Ambulatory Visit: Payer: Self-pay | Admitting: Family

## 2022-11-21 ENCOUNTER — Other Ambulatory Visit: Payer: Self-pay | Admitting: Family

## 2022-11-23 ENCOUNTER — Telehealth: Payer: Self-pay

## 2022-11-23 NOTE — Telephone Encounter (Signed)
Pt called regarding if we have received paperwork from [employer] for renewal on his oxygen, I haven't seen anything please advise

## 2022-12-23 ENCOUNTER — Other Ambulatory Visit: Payer: Self-pay | Admitting: Family

## 2023-01-22 ENCOUNTER — Ambulatory Visit: Payer: Self-pay | Admitting: Family

## 2023-01-25 ENCOUNTER — Other Ambulatory Visit: Payer: Self-pay | Admitting: Family

## 2023-01-28 ENCOUNTER — Encounter: Payer: Self-pay | Admitting: Family

## 2023-01-28 ENCOUNTER — Ambulatory Visit (INDEPENDENT_AMBULATORY_CARE_PROVIDER_SITE_OTHER): Payer: Medicare Other | Admitting: Family

## 2023-01-28 VITALS — BP 120/60 | HR 102 | Ht 71.0 in | Wt 160.8 lb

## 2023-01-28 DIAGNOSIS — J431 Panlobular emphysema: Secondary | ICD-10-CM

## 2023-01-28 DIAGNOSIS — Z0001 Encounter for general adult medical examination with abnormal findings: Secondary | ICD-10-CM

## 2023-01-28 DIAGNOSIS — I10 Essential (primary) hypertension: Secondary | ICD-10-CM | POA: Diagnosis not present

## 2023-01-28 DIAGNOSIS — Z0000 Encounter for general adult medical examination without abnormal findings: Secondary | ICD-10-CM

## 2023-01-28 DIAGNOSIS — Z9981 Dependence on supplemental oxygen: Secondary | ICD-10-CM

## 2023-01-28 DIAGNOSIS — R7303 Prediabetes: Secondary | ICD-10-CM | POA: Diagnosis not present

## 2023-01-28 DIAGNOSIS — E782 Mixed hyperlipidemia: Secondary | ICD-10-CM | POA: Diagnosis not present

## 2023-01-28 DIAGNOSIS — E559 Vitamin D deficiency, unspecified: Secondary | ICD-10-CM

## 2023-01-28 DIAGNOSIS — M5441 Lumbago with sciatica, right side: Secondary | ICD-10-CM

## 2023-01-28 DIAGNOSIS — R5383 Other fatigue: Secondary | ICD-10-CM

## 2023-01-28 DIAGNOSIS — C3490 Malignant neoplasm of unspecified part of unspecified bronchus or lung: Secondary | ICD-10-CM

## 2023-01-28 DIAGNOSIS — J9611 Chronic respiratory failure with hypoxia: Secondary | ICD-10-CM | POA: Insufficient documentation

## 2023-01-28 DIAGNOSIS — J9601 Acute respiratory failure with hypoxia: Secondary | ICD-10-CM

## 2023-01-28 DIAGNOSIS — E538 Deficiency of other specified B group vitamins: Secondary | ICD-10-CM

## 2023-01-28 DIAGNOSIS — G8929 Other chronic pain: Secondary | ICD-10-CM

## 2023-01-28 HISTORY — DX: Acute respiratory failure with hypoxia: J96.01

## 2023-01-28 MED ORDER — CYCLOBENZAPRINE HCL 5 MG PO TABS: 5.0000 mg | ORAL_TABLET | Freq: Three times a day (TID) | ORAL | 0 refills | Status: DC | PRN

## 2023-01-28 MED ORDER — TRELEGY ELLIPTA 100-62.5-25 MCG/ACT IN AEPB: 1.0000 | INHALATION_SPRAY | Freq: Every day | RESPIRATORY_TRACT | 11 refills | Status: DC

## 2023-01-29 LAB — CMP14+EGFR
ALT: 11 IU/L (ref 0–44)
AST: 18 IU/L (ref 0–40)
Albumin/Globulin Ratio: 1.7 (ref 1.2–2.2)
Albumin: 4.3 g/dL (ref 3.8–4.8)
Alkaline Phosphatase: 92 IU/L (ref 44–121)
BUN/Creatinine Ratio: 14 (ref 10–24)
BUN: 12 mg/dL (ref 8–27)
Bilirubin Total: 0.3 mg/dL (ref 0.0–1.2)
CO2: 29 mmol/L (ref 20–29)
Calcium: 9.3 mg/dL (ref 8.6–10.2)
Chloride: 93 mmol/L — ABNORMAL LOW (ref 96–106)
Creatinine, Ser: 0.84 mg/dL (ref 0.76–1.27)
Globulin, Total: 2.6 g/dL (ref 1.5–4.5)
Glucose: 105 mg/dL — ABNORMAL HIGH (ref 70–99)
Potassium: 4.3 mmol/L (ref 3.5–5.2)
Sodium: 135 mmol/L (ref 134–144)
Total Protein: 6.9 g/dL (ref 6.0–8.5)
eGFR: 90 mL/min/{1.73_m2} (ref >59)

## 2023-01-29 LAB — CBC WITH DIFFERENTIAL
Basophils Absolute: 0 10*3/uL (ref 0.0–0.2)
Basos: 0 %
EOS (ABSOLUTE): 0.1 10*3/uL (ref 0.0–0.4)
Eos: 2 %
Hematocrit: 41.5 % (ref 37.5–51.0)
Hemoglobin: 13.6 g/dL (ref 13.0–17.7)
Immature Grans (Abs): 0 10*3/uL (ref 0.0–0.1)
Immature Granulocytes: 0 %
Lymphocytes Absolute: 1.2 10*3/uL (ref 0.7–3.1)
Lymphs: 21 %
MCH: 26.6 pg (ref 26.6–33.0)
MCHC: 32.8 g/dL (ref 31.5–35.7)
MCV: 81 fL (ref 79–97)
Monocytes Absolute: 0.3 10*3/uL (ref 0.1–0.9)
Monocytes: 6 %
Neutrophils Absolute: 4 10*3/uL (ref 1.4–7.0)
Neutrophils: 71 %
RBC: 5.12 x10E6/uL (ref 4.14–5.80)
RDW: 14.4 % (ref 11.6–15.4)
WBC: 5.6 10*3/uL (ref 3.4–10.8)

## 2023-01-29 LAB — HEMOGLOBIN A1C
Est. average glucose Bld gHb Est-mCnc: 120 mg/dL
Hgb A1c MFr Bld: 5.8 % — ABNORMAL HIGH (ref 4.8–5.6)

## 2023-01-29 LAB — TSH: TSH: 0.476 u[IU]/mL (ref 0.450–4.500)

## 2023-01-29 LAB — LIPID PANEL
Chol/HDL Ratio: 2.1 ratio (ref 0.0–5.0)
Cholesterol, Total: 109 mg/dL (ref 100–199)
HDL: 52 mg/dL (ref >39)
LDL Chol Calc (NIH): 44 mg/dL (ref 0–99)
Triglycerides: 60 mg/dL (ref 0–149)
VLDL Cholesterol Cal: 13 mg/dL (ref 5–40)

## 2023-01-29 LAB — VITAMIN D 25 HYDROXY (VIT D DEFICIENCY, FRACTURES): Vit D, 25-Hydroxy: 70.4 ng/mL (ref 30.0–100.0)

## 2023-01-29 LAB — VITAMIN B12: Vitamin B-12: 478 pg/mL (ref 232–1245)

## 2023-01-30 ENCOUNTER — Encounter: Payer: Self-pay | Admitting: Family

## 2023-01-30 DIAGNOSIS — R7303 Prediabetes: Secondary | ICD-10-CM | POA: Insufficient documentation

## 2023-01-30 NOTE — Progress Notes (Signed)
Subjective:   Colton Nguyen is a 77 y.o. male who presents for Medicare Annual/Subsequent preventive examination.  Review of Systems    Review of Systems  Respiratory:  Positive for cough and shortness of breath.   Musculoskeletal:  Positive for back pain.  All other systems reviewed and are negative.         Objective:    Today's Vitals   01/28/23 1133  BP: 120/60  Pulse: (!) 102  SpO2: 92%  Weight: 160 lb 12.8 oz (72.9 kg)  Height: 5\' 11"  (1.803 m)   Body mass index is 22.43 kg/m.     07/15/2020    9:53 AM 01/25/2020    6:40 AM 11/16/2019    4:00 AM 11/08/2019    9:04 AM 09/09/2019   12:11 PM 02/06/2018    8:00 PM 01/04/2018    8:38 AM  Advanced Directives  Does Patient Have a Medical Advance Directive? No No No No No No No  Would patient like information on creating a medical advance directive? No - Patient declined No - Patient declined No - Patient declined No - Patient declined  No - Patient declined     Current Medications (verified) Outpatient Encounter Medications as of 01/28/2023  Medication Sig   acetaminophen (TYLENOL) 500 MG tablet Take 1,000 mg by mouth every 6 (six) hours as needed for moderate pain or headache.   albuterol (VENTOLIN HFA) 108 (90 Base) MCG/ACT inhaler Inhale 1 puff into the lungs every 6 (six) hours as needed for wheezing or shortness of breath.   amLODipine (NORVASC) 10 MG tablet Take 10 mg by mouth daily.    aspirin 81 MG tablet Take 81 mg by mouth daily.   atorvastatin (LIPITOR) 40 MG tablet Take 40 mg by mouth daily.   azelastine (ASTELIN) 0.1 % nasal spray USE ONE SPRAY IN EACH NOSTRIL TWICE DAILY   Cholecalciferol (VITAMIN D3) 5000 UNITS TABS Take 5,000 Units by mouth daily.   cyclobenzaprine (FLEXERIL) 5 MG tablet Take 1 tablet (5 mg total) by mouth 3 (three) times daily as needed for muscle spasms.   ferrous sulfate 325 (65 FE) MG tablet Take 325 mg by mouth daily with breakfast.    finasteride (PROSCAR) 5 MG tablet Take 1  tablet (5 mg total) by mouth daily.   fluticasone (FLONASE) 50 MCG/ACT nasal spray Place 2 sprays into both nostrils daily as needed for allergies.    Fluticasone-Umeclidin-Vilant (TRELEGY ELLIPTA) 100-62.5-25 MCG/ACT AEPB Inhale 1 puff into the lungs daily.   gabapentin (NEURONTIN) 100 MG capsule TAKE TWO CAPSULES BY MOUTH AT BEDTIME NIGHTLY FOR RESTLESS LEGS   losartan-hydrochlorothiazide (HYZAAR) 100-12.5 MG tablet Take 1 tablet by mouth daily.   SYMBICORT 160-4.5 MCG/ACT inhaler Inhale 2 puffs into the lungs 2 (two) times daily.    tamsulosin (FLOMAX) 0.4 MG CAPS capsule Take 1 capsule (0.4 mg total) by mouth daily.   Tiotropium Bromide Monohydrate (SPIRIVA RESPIMAT) 2.5 MCG/ACT AERS Inhale 1 puff into the lungs daily.   traMADol (ULTRAM) 50 MG tablet TAKE ONE TABLET BY MOUTH EVERY 12 HOURS AS NEEDED FOR SEVERE PAIN   [DISCONTINUED] methocarbamol (ROBAXIN) 500 MG tablet TAKE TWO TABLETS BY MOUTH EVERY SIX HOURS AS NEEDED FOR MUSCLE SPASMS   LORazepam (ATIVAN) 0.5 MG tablet Take 0.5 mg by mouth daily as needed for anxiety.   No facility-administered encounter medications on file as of 01/28/2023.    Allergies (verified) Patient has no known allergies.   History: Past Medical History:  Diagnosis Date  Anxiety    situational   Arthritis    BPH (benign prostatic hyperplasia)    CAD (coronary artery disease)    CHF (congestive heart failure) (HCC)    pt denies 11/13/2019   Chronic cardiopulmonary disease (HCC)    COPD (chronic obstructive pulmonary disease) (HCC)    History of kidney stones    Hyperlipidemia    Hypertension    Hypertension 12/28/2016   lung ca dx'd 03/2015   Mass of lung    Nicotine dependence    Personal history of kidney stones    Restless leg    Shortness of breath dyspnea    with exertion   Tuberculosis    Wears glasses    Past Surgical History:  Procedure Laterality Date   BACK SURGERY     BIOPSY  01/25/2020   Procedure: BIOPSY;  Surgeon: Sherrilyn Rist, MD;  Location: [facility];  Service: Gastroenterology;;   CAD CCTA 01/31/15     CERVICAL FUSION     COLONOSCOPY     COLONOSCOPY WITH PROPOFOL N/A 01/25/2020   Procedure: COLONOSCOPY WITH PROPOFOL;  Surgeon: Sherrilyn Rist, MD;  Location: [facility];  Service: Gastroenterology;  Laterality: N/A;   ESOPHAGOGASTRODUODENOSCOPY (EGD) WITH PROPOFOL N/A 01/25/2020   Procedure: ESOPHAGOGASTRODUODENOSCOPY (EGD) WITH PROPOFOL;  Surgeon: Sherrilyn Rist, MD;  Location: [facility];  Service: Gastroenterology;  Laterality: N/A;   FRACTURE SURGERY     left ankle   HEMOSTASIS CLIP PLACEMENT  01/25/2020   Procedure: HEMOSTASIS CLIP PLACEMENT;  Surgeon: Sherrilyn Rist, MD;  Location: [facility];  Service: Gastroenterology;;   HOT HEMOSTASIS N/A 01/25/2020   Procedure: HOT HEMOSTASIS (ARGON PLASMA COAGULATION/BICAP);  Surgeon: Sherrilyn Rist, MD;  Location: [facility];  Service: Gastroenterology;  Laterality: N/A;   LUNG BIOPSY N/A 02/27/2015   Procedure: LUNG BIOPSY;  Surgeon: Delight Ovens, MD;  Location: [facility] OR;  Service: Thoracic;  Laterality: N/A;   right inguinal hernia repair at age 83     VIDEO BRONCHOSCOPY WITH ENDOBRONCHIAL ULTRASOUND N/A 02/27/2015   Procedure: VIDEO BRONCHOSCOPY WITH ENDOBRONCHIAL ULTRASOUND;  Surgeon: Delight Ovens, MD;  Location: [facility] OR;  Service: Thoracic;  Laterality: N/A;   Family History  Problem Relation Age of Onset   Cancer Other    Heart disease Father    Lung cancer Father    Hypertension Other    Diabetes Mother    Valvular heart disease Mother    Diabetes Sister    Colon polyps Maternal Uncle    Diabetes Sister    Diabetes Sister    Diabetes Niece    Colon cancer Cousin    Esophageal cancer Neg Hx    Stomach cancer Neg Hx    Pancreatic cancer Neg Hx    Social History   Socioeconomic History   Marital status: Divorced    Spouse name: Not on file   Number of children: Not on file   Years of education: Not on file   Highest  education level: Not on file  Occupational History   Not on file  Tobacco Use   Smoking status: Every Day    Packs/day: 1.50    Years: 47.00    Additional pack years: 0.00    Total pack years: 70.50    Types: Cigarettes    Start date: 02/14/1967    Passive exposure: Current   Smokeless tobacco: Never  Vaping Use   Vaping Use: Former  Substance and Sexual Activity  Alcohol use: Yes    Alcohol/week: 0.0 standard drinks of alcohol    Comment: ocassional   Drug use: No   Sexual activity: Yes  Other Topics Concern   Not on file  Social History Narrative   Not on file   Social Determinants of Health   Financial Resource Strain: Low Risk  (01/30/2023)   Overall Financial Resource Strain (CARDIA)    Difficulty of Paying Living Expenses: Not very hard  Food Insecurity: Not on file  Transportation Needs: No Transportation Needs (01/30/2023)   PRAPARE - Transportation    Lack of Transportation (Medical): No    Lack of Transportation (Non-Medical): No  Physical Activity: Not on file  Stress: Not on file  Social Connections: Not on file    Tobacco Counseling Ready to quit: No Counseling given: Yes   Clinical Intake:  Pain : No/denies pain   BMI - recorded: 22.43 Nutritional Status: BMI of 19-24  Normal Nutritional Risks: None Diabetes: No  How often do you need to have someone help you when you read instructions, pamphlets, or other written materials from your doctor or pharmacy?: 1 - Never  Diabetic? No, is pre-diabetic.   Interpreter Needed?: No   Activities of Daily Living     No data to display          Patient Care Team: Miki Kins, FNP as PCP - General (Family Medicine) Laurier Nancy, MD as Consulting Physician (Cardiology) Delight Ovens, MD (Inactive) as Consulting Physician (Cardiothoracic Surgery)      Assessment:   This is a routine wellness examination for Taylan.  Hearing/Vision screen No results found.  Dietary issues and  exercise activities discussed:     Goals Addressed             This Visit's Progress    Disease Progression Minimized or Managed       Evidence-based guidance:  Identify current smoking/tobacco use; provide smoking cessation intervention.  Assess symptom control by the frequency and type of symptoms, reliever use and activity limitation at every encounter.  Assess risk for exacerbation (flare up) by evaluating spirometry, pulse oximetry, reliever use, presentation of symptoms and activity limitation; anticipate treatment adjustment based on risks and resources.  Develop and/or review and reinforce use of COPD rescue (action) plan even when symptoms are controlled or infrequent.  Ask patient to bring inhaler to all visits; assess and reinforce correct technique; address barriers to proper inhaler use, such as older age, use of multiple devices and lack of understanding.   Identify symptom triggers, such as smoking, virus, weather change, emotional upset, exercise, obesity and environmental allergen; consider reduction of work-exposure versus elimination to avoid compromising employment.  Correlate presentation to comorbidity, such as diabetes, heart failure, obstructive sleep apnea, depression and anxiety, which may worsen symptoms.  Prepare for individualized pharmacologic therapy that may include LABA (long-acting beta-2 agonist), LAMA (long-acting muscarinic antagonist), SABA (short-acting beta-2 agonist) oral or inhaled corticosteroid.  Promote participation in pulmonary rehabilitation for breathing exercises, skills training, improved exercise capacity, mood and quality of life; address barriers to participation.  Promote physical activity or exercise to improve or maintain exercise capacity, based on tolerance that may include walking, water exercise, cycling or limb muscle strength training.  Promote use of energy conservation and activity pacing techniques.  Promote use of breathing and  coughing techniques, such as inspiratory muscle training, pursed-lip breathing, diaphragmatic breathing, pranayama yoga breathing or huff cough.  Screen for malnutrition risk factors, such as unintentional  weight loss and poor oral intake; refer to dietitian if identified.  Consider recommendation for oral drink supplement or multivitamin and mineral supplements if suspect inadequate oral intake or micronutrient deficiencies.   Screen for obstructive sleep apnea; prepare patient for polysomnography based on risk and presentation.  Prepare patient for use of long-term oxygen and noninvasive ventilation to relieve hypercapnia, hypoxemia, obstructive sleep apnea and reduce work of breathing.  Prepare patient with worsening disease for surgical interventions that may include bronchoscopy, lung volume reduction surgery, bullectomy or lung transplantation.   Notes:      Maintain Mobility and Function       Evidence-based guidance:  Acknowledge and validate impact of pain, loss of strength and potential disfigurement (hand osteoarthritis) on mental health and daily life, such as social isolation, anxiety, depression, impaired sexual relationship and   injury from falls.  Anticipate referral to physical or occupational therapy for assessment, therapeutic exercise and recommendation for adaptive equipment or assistive devices; encourage participation.  Assess impact on ability to perform activities of daily living, as well as engage in sports and leisure events or requirements of work or school.  Provide anticipatory guidance and reassurance about the benefit of exercise to maintain function; acknowledge and normalize fear that exercise may worsen symptoms.  Encourage regular exercise, at least 10 minutes at a time for 45 minutes per week; consider yoga, water exercise and proprioceptive exercises; encourage use of wearable activity tracker to increase motivation and adherence.  Encourage maintenance or  resumption of daily activities, including employment, as pain allows and with minimal exposure to trauma.  Assist patient to advocate for adaptations to the work environment.  Consider level of pain and function, gender, age, lifestyle, patient preference, quality of life, readiness and ?ocapacity to benefit? when recommending patients for orthopaedic surgery consultation.  Explore strategies, such as changes to medication regimen or activity that enables patient to anticipate and manage flare-ups that increase deconditioning and disability.  Explore patient preferences; encourage exposure to a broader range of activities that have been avoided for fear of experiencing pain.  Identify barriers to participation in therapy or exercise, such as pain with activity, anticipated or imagined pain.  Monitor postoperative joint replacement or any preexisting joint replacement for ongoing pain and loss of function; provide social support and encouragement throughout recovery.   Notes:       Depression Screen    01/30/2023    2:26 PM 06/06/2015   10:12 AM  PHQ 2/9 Scores  PHQ - 2 Score 0 0    Fall Risk    06/06/2015   10:12 AM  Fall Risk   Falls in the past year? No    FALL RISK PREVENTION PERTAINING TO THE HOME:  Any stairs in or around the home? Yes  If so, are there any without handrails? No  Home free of loose throw rugs in walkways, pet beds, electrical cords, etc? Yes  Adequate lighting in your home to reduce risk of falls? Yes   ASSISTIVE DEVICES UTILIZED TO PREVENT FALLS:  Life alert? No  Use of a cane, walker or w/c? Yes  Grab bars in the bathroom? No  Shower chair or bench in shower? No  Elevated toilet seat or a handicapped toilet? No   Gait slow and steady with assistive device  Cognitive Function:        Immunizations Immunization History  Administered Date(s) Administered   Influenza-Unspecified 08/05/2015   PFIZER Comirnaty(Gray Top)Covid-19 Tri-Sucrose  Vaccine 01/10/2021   Zoster Recombinat (  Shingrix) 06/30/2017, 09/22/2017    TDAP status: Up to date  Flu Vaccine status: Up to date  Pneumococcal vaccine status: Up to date  Covid-19 vaccine status: Completed vaccines  Qualifies for Shingles Vaccine? Yes   Zostavax completed No   Shingrix Completed?: Yes  Screening Tests Health Maintenance  Topic Date Due   Pneumonia Vaccine 23+ Years old (1 of 2 - PCV) Never done   Hepatitis C Screening  Never done   DTaP/Tdap/Td (1 - Tdap) Never done   COVID-19 Vaccine (2 - Pfizer risk series) 01/31/2021   INFLUENZA VACCINE  04/08/2023   Medicare Annual Wellness (AWV)  01/28/2024   Zoster Vaccines- Shingrix  Completed   HPV VACCINES  Aged Out   Colonoscopy  Discontinued    Health Maintenance  Health Maintenance Due  Topic Date Due   Pneumonia Vaccine 49+ Years old (1 of 2 - PCV) Never done   Hepatitis C Screening  Never done   DTaP/Tdap/Td (1 - Tdap) Never done   COVID-19 Vaccine (2 - Pfizer risk series) 01/31/2021    Colorectal cancer screening: Type of screening: Colonoscopy. Completed 01/25/2020. Repeat every N/A years.  Patient aged out.  Lung Cancer Screening: (Low Dose CT Chest recommended if Age 46-80 years, 30 pack-year currently smoking OR have quit w/in 15years.) does qualify.   Lung Cancer Screening Referral: N/A - patient has had lung CA, is monitored by oncology.     Plan:     Problem List Items Addressed This Visit       Active Problems   Hypertension    Blood pressure well controlled with current medications.  Continue current therapy.  Will reassess at follow up.       COPD (chronic obstructive pulmonary disease) (HCC)    Patient is utilizing oxygen therapy, and is doing well overall.  Says that he did have a letter from his insurance company, saying that they were not going to cover his inhalers.  He says that he would like to see if we can do anything else.   Trelegy is covered with his insurance,  and he is willing to switch to this, in mail order it is cheaper.   Will reassess at follow up.       Relevant Medications   Fluticasone-Umeclidin-Vilant (TRELEGY ELLIPTA) 100-62.5-25 MCG/ACT AEPB   Chronic respiratory failure with hypoxia, on home O2 therapy [facility])    Patient is well managed on his current oxygen and medications.  Will defer to pulmonology for any further changes to his treatments.   Reassess at follow up.       Prediabetes    A1C Continues to be in prediabetic ranges.  Will reassess at follow up after next lab check.  Patient counseled on dietary choices and verbalized understanding.  Patient educated on foods that contain carbohydrates and the need to decrease intake.  We discussed prediabetes, and what it means and the need for strict dietary control to prevent progression to type 2 diabetes.  Advised to decrease intake of sugary drinks, including sodas, sweet tea, and some juices, and of starch and sugar heavy foods (ie., potatoes, rice, bread, pasta, desserts). He verbalizes understanding and agreement with the changes discussed today.       Relevant Orders   CBC With Differential (Completed)   CMP14+EGFR (Completed)   Hemoglobin A1c (Completed)   Other Visit Diagnoses     Essential hypertension, benign    -  Primary   Relevant Orders   CBC With Differential (  Completed)   CMP14+EGFR (Completed)   Mixed hyperlipidemia       Relevant Orders   Lipid panel (Completed)   CBC With Differential (Completed)   CMP14+EGFR (Completed)   Vitamin D deficiency, unspecified       Relevant Orders   VITAMIN D 25 Hydroxy (Vit-D Deficiency, Fractures) (Completed)   CBC With Differential (Completed)   CMP14+EGFR (Completed)   B12 deficiency due to diet       Relevant Orders   CBC With Differential (Completed)   CMP14+EGFR (Completed)   Vitamin B12 (Completed)   Chronic right-sided low back pain with right-sided sciatica       Relevant Medications   cyclobenzaprine  (FLEXERIL) 5 MG tablet   Other Relevant Orders   CBC With Differential (Completed)   CMP14+EGFR (Completed)   Other fatigue       Relevant Orders   CBC With Differential (Completed)   CMP14+EGFR (Completed)   TSH (Completed)   Malignant neoplasm of unspecified part of unspecified bronchus or lung (HCC)   (Chronic)     Encounter for Medicare annual wellness exam            I have personally reviewed and noted the following in the patient's chart:   Medical and social history Use of alcohol, tobacco or illicit drugs  Current medications and supplements including opioid prescriptions. Patient is currently taking opioid prescriptions. Information provided to patient regarding non-opioid alternatives. Patient advised to discuss non-opioid treatment plan with their provider. Functional ability and status Nutritional status Physical activity Advanced directives List of other physicians Hospitalizations, surgeries, and ER visits in previous 12 months Vitals Screenings to include cognitive, depression, and falls Referrals and appointments  In addition, I have reviewed and discussed with patient certain preventive protocols, quality metrics, and best practice recommendations. A written personalized care plan for preventive services as well as general preventive health recommendations were provided to patient.     Miki Kins, FNP   01/30/2023   01/28/2023

## 2023-01-30 NOTE — Assessment & Plan Note (Signed)
Patient is utilizing oxygen therapy, and is doing well overall.  Says that he did have a letter from his insurance company, saying that they were not going to cover his inhalers.  He says that he would like to see if we can do anything else.   Trelegy is covered with his insurance, and he is willing to switch to this, in mail order it is cheaper.   Will reassess at follow up.

## 2023-01-30 NOTE — Assessment & Plan Note (Signed)
Blood pressure well controlled with current medications.  Continue current therapy.  Will reassess at follow up.  

## 2023-01-30 NOTE — Assessment & Plan Note (Signed)
Patient is well managed on his current oxygen and medications.  Will defer to pulmonology for any further changes to his treatments.   Reassess at follow up.

## 2023-01-30 NOTE — Assessment & Plan Note (Signed)
A1C Continues to be in prediabetic ranges.  Will reassess at follow up after next lab check.  Patient counseled on dietary choices and verbalized understanding.  Patient educated on foods that contain carbohydrates and the need to decrease intake.  We discussed prediabetes, and what it means and the need for strict dietary control to prevent progression to type 2 diabetes.  Advised to decrease intake of sugary drinks, including sodas, sweet tea, and some juices, and of starch and sugar heavy foods (ie., potatoes, rice, bread, pasta, desserts). He verbalizes understanding and agreement with the changes discussed today.

## 2023-01-30 NOTE — Patient Instructions (Signed)
Health Maintenance, Male Adopting a healthy lifestyle and getting preventive care are important in promoting health and wellness. Ask your health care provider about: The right schedule for you to have regular tests and exams. Things you can do on your own to prevent diseases and keep yourself healthy. What should I know about diet, weight, and exercise? Eat a healthy diet  Eat a diet that includes plenty of vegetables, fruits, low-fat dairy products, and lean protein. Do not eat a lot of foods that are high in solid fats, added sugars, or sodium. Maintain a healthy weight Body mass index (BMI) is a measurement that can be used to identify possible weight problems. It estimates body fat based on height and weight. Your health care provider can help determine your BMI and help you achieve or maintain a healthy weight. Get regular exercise Get regular exercise. This is one of the most important things you can do for your health. Most adults should: Exercise for at least 150 minutes each week. The exercise should increase your heart rate and make you sweat (moderate-intensity exercise). Do strengthening exercises at least twice a week. This is in addition to the moderate-intensity exercise. Spend less time sitting. Even light physical activity can be beneficial. Watch cholesterol and blood lipids Have your blood tested for lipids and cholesterol at 76 years of age, then have this test every 5 years. You may need to have your cholesterol levels checked more often if: Your lipid or cholesterol levels are high. You are older than 76 years of age. You are at high risk for heart disease. What should I know about cancer screening? Many types of cancers can be detected early and may often be prevented. Depending on your health history and family history, you may need to have cancer screening at various ages. This may include screening for: Colorectal cancer. Prostate cancer. Skin cancer. Lung  cancer. What should I know about heart disease, diabetes, and high blood pressure? Blood pressure and heart disease High blood pressure causes heart disease and increases the risk of stroke. This is more likely to develop in people who have high blood pressure readings or are overweight. Talk with your health care provider about your target blood pressure readings. Have your blood pressure checked: Every 3-5 years if you are 23-43 years of age. Every year if you are 15 years old or older. If you are between the ages of 60 and 49 and are a current or former smoker, ask your health care provider if you should have a one-time screening for abdominal aortic aneurysm (AAA). Diabetes Have regular diabetes screenings. This checks your fasting blood sugar level. Have the screening done: Once every three years after age 16 if you are at a normal weight and have a low risk for diabetes. More often and at a younger age if you are overweight or have a high risk for diabetes. What should I know about preventing infection? Hepatitis B If you have a higher risk for hepatitis B, you should be screened for this virus. Talk with your health care provider to find out if you are at risk for hepatitis B infection. Hepatitis C Blood testing is recommended for: Everyone born from [birth year] through 1964. Anyone with known risk factors for hepatitis C. Sexually transmitted infections (STIs) You should be screened each year for STIs, including gonorrhea and chlamydia, if: You are sexually active and are younger than 76 years of age. You are older than 76 years of age and your  health care provider tells you that you are at risk for this type of infection. Your sexual activity has changed since you were last screened, and you are at increased risk for chlamydia or gonorrhea. Ask your health care provider if you are at risk. Ask your health care provider about whether you are at high risk for HIV. Your health care provider  may recommend a prescription medicine to help prevent HIV infection. If you choose to take medicine to prevent HIV, you should first get tested for HIV. You should then be tested every 3 months for as long as you are taking the medicine. Follow these instructions at home: Alcohol use Do not drink alcohol if your health care provider tells you not to drink. If you drink alcohol: Limit how much you have to 0-2 drinks a day. Know how much alcohol is in your drink. In the [place] one drink equals one 12 oz bottle of beer (355 mL), one 5 oz glass of wine (148 mL), or one 1 oz glass of hard liquor (44 mL). Lifestyle Do not use any products that contain nicotine or tobacco. These products include cigarettes, chewing tobacco, and vaping devices, such as e-cigarettes. If you need help quitting, ask your health care provider. Do not use street drugs. Do not share needles. Ask your health care provider for help if you need support or information about quitting drugs. General instructions Schedule regular health, dental, and eye exams. Stay current with your vaccines. Tell your health care provider if: You often feel depressed. You have ever been abused or do not feel safe at home. Summary Adopting a healthy lifestyle and getting preventive care are important in promoting health and wellness. Follow your health care provider's instructions about healthy diet, exercising, and getting tested or screened for diseases. Follow your health care provider's instructions on monitoring your cholesterol and blood pressure. This information is not intended to replace advice given to you by your health care provider. Make sure you discuss any questions you have with your health care provider. Document Revised: 01/13/2021 Document Reviewed: 01/13/2021 Elsevier Patient Education  2024 [employer] Inc.  Steps to Quit Smoking Smoking tobacco is the leading cause of preventable death. It can affect almost every organ in  the body. Smoking puts you and people around you at risk for many serious, long-lasting (chronic) diseases. Quitting smoking can be hard, but it is one of the best things that you can do for your health. It is never too late to quit. Do not give up if you cannot quit the first time. Some people need to try many times to quit. Do your best to stick to your quit plan, and talk with your doctor if you have any questions or concerns. How do I get ready to quit? Pick a date to quit. Set a date within the next 2 weeks to give you time to prepare. Write down the reasons why you are quitting. Keep this list in places where you will see it often. Tell your family, friends, and co-workers that you are quitting. Their support is important. Talk with your doctor about the choices that may help you quit. Find out if your health insurance will pay for these treatments. Know the people, places, things, and activities that make you want to smoke (triggers). Avoid them. What first steps can I take to quit smoking? Throw away all cigarettes at home, at work, and in your car. Throw away the things that you use when you smoke,  such as Set designer. Clean your car. Empty the ashtray. Clean your home, including curtains and carpets. What can I do to help me quit smoking? Talk with your doctor about taking medicines and seeing a counselor. You are more likely to succeed when you do both. If you are pregnant or breastfeeding: Talk with your doctor about counseling or other ways to quit smoking. Do not take medicine to help you quit smoking unless your doctor tells you to. Quit right away Quit smoking completely, instead of slowly cutting back on how much you smoke over a period of time. Stopping smoking right away may be more successful than slowly quitting. Go to counseling. In-person is best if this is an option. You are more likely to quit if you go to counseling sessions regularly. Take medicine You may  take medicines to help you quit. Some medicines need a prescription, and some you can buy over-the-counter. Some medicines may contain a drug called nicotine to replace the nicotine in cigarettes. Medicines may: Help you stop having the desire to smoke (cravings). Help to stop the problems that come when you stop smoking (withdrawal symptoms). Your doctor may ask you to use: Nicotine patches, gum, or lozenges. Nicotine inhalers or sprays. Non-nicotine medicine that you take by mouth. Find resources Find resources and other ways to help you quit smoking and remain smoke-free after you quit. They include: Online chats with a Veterinary surgeon. Phone quitlines. Printed Materials engineer. Support groups or group counseling. Text messaging programs. Mobile phone apps. Use apps on your mobile phone or tablet that can help you stick to your quit plan. Examples of free services include Quit Guide from the CDC and smokefree.gov  What can I do to make it easier to quit?  Talk to your family and friends. Ask them to support and encourage you. Call a phone quitline, such as 1-800-QUIT-NOW, reach out to support groups, or work with a Veterinary surgeon. Ask people who smoke to not smoke around you. Avoid places that make you want to smoke, such as: Bars. Parties. Smoke-break areas at work. Spend time with people who do not smoke. Lower the stress in your life. Stress can make you want to smoke. Try these things to lower stress: Getting regular exercise. Doing deep-breathing exercises. Doing yoga. Meditating. What benefits will I see if I quit smoking? Over time, you may have: A better sense of smell and taste. Less coughing and sore throat. A slower heart rate. Lower blood pressure. Clearer skin. Better breathing. Fewer sick days. Summary Quitting smoking can be hard, but it is one of the best things that you can do for your health. Do not give up if you cannot quit the first time. Some people need to  try many times to quit. When you decide to quit smoking, make a plan to help you succeed. Quit smoking right away, not slowly over a period of time. When you start quitting, get help and support to keep you smoke-free. This information is not intended to replace advice given to you by your health care provider. Make sure you discuss any questions you have with your health care provider. Document Revised: 08/15/2021 Document Reviewed: 08/15/2021 Elsevier Patient Education  2024 [employer].

## 2023-02-04 ENCOUNTER — Other Ambulatory Visit: Payer: Self-pay | Admitting: Family

## 2023-04-08 ENCOUNTER — Other Ambulatory Visit: Payer: Self-pay | Admitting: Family

## 2023-04-15 ENCOUNTER — Other Ambulatory Visit: Payer: Self-pay | Admitting: Family

## 2023-05-11 ENCOUNTER — Other Ambulatory Visit: Payer: Self-pay | Admitting: Family

## 2023-05-13 ENCOUNTER — Other Ambulatory Visit: Payer: Self-pay | Admitting: Family

## 2023-05-18 ENCOUNTER — Other Ambulatory Visit: Payer: Self-pay | Admitting: Family

## 2023-05-22 ENCOUNTER — Other Ambulatory Visit: Payer: Self-pay | Admitting: Family

## 2023-06-08 ENCOUNTER — Other Ambulatory Visit: Payer: Self-pay | Admitting: Family

## 2023-06-09 ENCOUNTER — Other Ambulatory Visit: Payer: Self-pay | Admitting: Family

## 2023-07-07 ENCOUNTER — Other Ambulatory Visit: Payer: Self-pay | Admitting: Family

## 2023-07-12 ENCOUNTER — Inpatient Hospital Stay: Payer: Medicare Other | Attending: Internal Medicine

## 2023-07-12 ENCOUNTER — Ambulatory Visit (HOSPITAL_COMMUNITY): Payer: Medicare Other

## 2023-07-12 DIAGNOSIS — Z85118 Personal history of other malignant neoplasm of bronchus and lung: Secondary | ICD-10-CM | POA: Insufficient documentation

## 2023-07-14 ENCOUNTER — Other Ambulatory Visit: Payer: Self-pay | Admitting: *Deleted

## 2023-07-14 DIAGNOSIS — C3490 Malignant neoplasm of unspecified part of unspecified bronchus or lung: Secondary | ICD-10-CM

## 2023-07-15 ENCOUNTER — Inpatient Hospital Stay: Payer: Medicare Other | Admitting: Internal Medicine

## 2023-07-20 ENCOUNTER — Other Ambulatory Visit: Payer: Self-pay | Admitting: Physician Assistant

## 2023-07-20 DIAGNOSIS — C3411 Malignant neoplasm of upper lobe, right bronchus or lung: Secondary | ICD-10-CM

## 2023-07-21 ENCOUNTER — Inpatient Hospital Stay: Payer: Medicare Other

## 2023-07-21 ENCOUNTER — Ambulatory Visit (HOSPITAL_COMMUNITY)
Admission: RE | Admit: 2023-07-21 | Discharge: 2023-07-21 | Discharge status: Home / Self Care | Payer: Medicare Other | Source: Ambulatory Visit | Attending: Internal Medicine | Admitting: Internal Medicine

## 2023-07-21 DIAGNOSIS — C3411 Malignant neoplasm of upper lobe, right bronchus or lung: Secondary | ICD-10-CM

## 2023-07-21 DIAGNOSIS — C3490 Malignant neoplasm of unspecified part of unspecified bronchus or lung: Secondary | ICD-10-CM | POA: Insufficient documentation

## 2023-07-21 DIAGNOSIS — Z85118 Personal history of other malignant neoplasm of bronchus and lung: Secondary | ICD-10-CM | POA: Diagnosis present

## 2023-07-21 LAB — CBC WITH DIFFERENTIAL (CANCER CENTER ONLY)
Abs Immature Granulocytes: 0.01 10*3/uL (ref 0.00–0.07)
Basophils Absolute: 0 10*3/uL (ref 0.0–0.1)
Basophils Relative: 1 %
Eosinophils Absolute: 0.2 10*3/uL (ref 0.0–0.5)
Eosinophils Relative: 3 %
HCT: 38.3 % — ABNORMAL LOW (ref 39.0–52.0)
Hemoglobin: 12.5 g/dL — ABNORMAL LOW (ref 13.0–17.0)
Immature Granulocytes: 0 %
Lymphocytes Relative: 16 %
Lymphs Abs: 1.1 10*3/uL (ref 0.7–4.0)
MCH: 26.3 pg (ref 26.0–34.0)
MCHC: 32.6 g/dL (ref 30.0–36.0)
MCV: 80.6 fL (ref 80.0–100.0)
Monocytes Absolute: 0.5 10*3/uL (ref 0.1–1.0)
Monocytes Relative: 7 %
Neutro Abs: 4.9 10*3/uL (ref 1.7–7.7)
Neutrophils Relative %: 73 %
Platelet Count: 281 10*3/uL (ref 150–400)
RBC: 4.75 MIL/uL (ref 4.22–5.81)
RDW: 14.4 % (ref 11.5–15.5)
WBC Count: 6.6 10*3/uL (ref 4.0–10.5)
nRBC: 0 % (ref 0.0–0.2)

## 2023-07-21 LAB — CMP (CANCER CENTER ONLY)
ALT: 11 U/L (ref 0–44)
AST: 19 U/L (ref 15–41)
Albumin: 3.9 g/dL (ref 3.5–5.0)
Alkaline Phosphatase: 82 U/L (ref 38–126)
Anion gap: 4 — ABNORMAL LOW (ref 5–15)
BUN: 12 mg/dL (ref 8–23)
CO2: 35 mmol/L — ABNORMAL HIGH (ref 22–32)
Calcium: 9.6 mg/dL (ref 8.9–10.3)
Chloride: 98 mmol/L (ref 98–111)
Creatinine: 0.92 mg/dL (ref 0.61–1.24)
GFR, Estimated: >60 mL/min (ref >60)
Glucose, Bld: 104 mg/dL — ABNORMAL HIGH (ref 70–99)
Potassium: 4.1 mmol/L (ref 3.5–5.1)
Sodium: 137 mmol/L (ref 135–145)
Total Bilirubin: 0.5 mg/dL (ref <1.2)
Total Protein: 7 g/dL (ref 6.5–8.1)

## 2023-07-21 MED ORDER — IOHEXOL 300 MG/ML  SOLN
75.0000 mL | Freq: Once | INTRAMUSCULAR | Status: AC | PRN
  Administered 2023-07-21: 75 mL via INTRAVENOUS

## 2023-07-26 NOTE — Progress Notes (Unsigned)
[facility] OFFICE PROGRESS NOTE  Miki Kins, FNP [address] [place] [address]  DIAGNOSIS: Stage IIIA (T3, N2, M0) non-small cell lung cancer, squamous cell carcinoma diagnosed in June 2016.   PRIOR THERAPY:  1) Concurrent chemoradiation with weekly carboplatin for an AUC of 2 and paclitaxel 45 mg/m2 with partial response.  2) Consolidation systemic chemotherapy with carboplatin for AUC of 5 and paclitaxel 175 MG/M2 every 3 weeks. First cycle 06/10/2015. Status post 3 cycles.  CURRENT THERAPY: Observation   INTERVAL HISTORY: Colton Nguyen 77 y.o. male returns to clinic today for a 1 year follow-up visit.  The patient was last seen by Dr. Arbutus Ped in November 2023.  The patient has a history of stage III lung cancer and he has been on observation since 2016 and feeling well.  He denies any recent fever, chills, night sweats, or unexplained weight loss.  Denies any chest pain, shortness of breath, cough, or hemoptysis.  Denies any nausea, vomiting, diarrhea, or constipation.  Denies any headache or visual changes.  He recently had a restaging CT scan performed.  He is here today for evaluation to review his scan results.  MEDICAL HISTORY: Past Medical History:  Diagnosis Date   Acute respiratory failure with hypoxia (HCC) 01/28/2023   Anxiety    situational   Arthritis    BPH (benign prostatic hyperplasia)    CAD (coronary artery disease)    CHF (congestive heart failure) (HCC)    pt denies 11/13/2019   Chronic cardiopulmonary disease (HCC)    COPD (chronic obstructive pulmonary disease) (HCC)    History of kidney stones    Hyperlipidemia    Hypertension    Hypertension 12/28/2016   lung ca dx'd 03/2015   Mass of lung    Nicotine dependence    Personal history of kidney stones    Restless leg    Shortness of breath dyspnea    with exertion   Tuberculosis    Wears glasses     ALLERGIES:  has No Known Allergies.  MEDICATIONS:  Current  Outpatient Medications  Medication Sig Dispense Refill   acetaminophen (TYLENOL) 500 MG tablet Take 1,000 mg by mouth every 6 (six) hours as needed for moderate pain or headache.     albuterol (VENTOLIN HFA) 108 (90 Base) MCG/ACT inhaler Inhale 1 puff into the lungs every 6 (six) hours as needed for wheezing or shortness of breath.     amLODipine (NORVASC) 10 MG tablet TAKE ONE TABLET BY MOUTH ONCE A DAY 90 tablet 3   aspirin 81 MG tablet Take 81 mg by mouth daily.     atorvastatin (LIPITOR) 40 MG tablet TAKE ONE TABLET BY MOUTH ONCE A DAY 90 tablet 3   azelastine (ASTELIN) 0.1 % nasal spray USE ONE SPRAY IN EACH NOSTRIL TWICE DAILY 30 mL 0   Cholecalciferol (VITAMIN D3) 5000 UNITS TABS Take 5,000 Units by mouth daily.     cyclobenzaprine (FLEXERIL) 5 MG tablet TAKE ONE TABLET (5 MG TOTAL) BY MOUTH THREE TIMES DAILY AS NEEDED FOR MUSCLE SPASMS. 30 tablet 3   ferrous sulfate 325 (65 FE) MG tablet Take 325 mg by mouth daily with breakfast.      finasteride (PROSCAR) 5 MG tablet Take 1 tablet (5 mg total) by mouth daily. 90 tablet 3   fluticasone (FLONASE) 50 MCG/ACT nasal spray Place 2 sprays into both nostrils daily as needed for allergies.      Fluticasone-Umeclidin-Vilant (TRELEGY ELLIPTA) 100-62.5-25 MCG/ACT AEPB Inhale  1 puff into the lungs daily. 180 each 11   gabapentin (NEURONTIN) 100 MG capsule TAKE TWO CAPSULES BY MOUTH AT BEDTIME NIGHTLY FOR RESTLESS LEGS 180 capsule 1   LORazepam (ATIVAN) 0.5 MG tablet Take 0.5 mg by mouth daily as needed for anxiety.     losartan-hydrochlorothiazide (HYZAAR) 100-12.5 MG tablet TAKE ONE TABLET BY MOUTH ONCE A DAY 90 tablet 3   SYMBICORT 160-4.5 MCG/ACT inhaler Inhale 2 puffs into the lungs 2 (two) times daily.      tamsulosin (FLOMAX) 0.4 MG CAPS capsule Take 1 capsule (0.4 mg total) by mouth daily. 90 capsule 3   Tiotropium Bromide Monohydrate (SPIRIVA RESPIMAT) 2.5 MCG/ACT AERS Inhale 1 puff into the lungs daily.     traMADol (ULTRAM) 50 MG tablet  Take 1 tablet (50 mg total) by mouth every 12 (twelve) hours as needed. 60 tablet 2   No current facility-administered medications for this visit.    SURGICAL HISTORY:  Past Surgical History:  Procedure Laterality Date   BACK SURGERY     BIOPSY  01/25/2020   Procedure: BIOPSY;  Surgeon: Sherrilyn Rist, MD;  Location: [facility];  Service: Gastroenterology;;   CAD CCTA 01/31/15     CERVICAL FUSION     COLONOSCOPY     COLONOSCOPY WITH PROPOFOL N/A 01/25/2020   Procedure: COLONOSCOPY WITH PROPOFOL;  Surgeon: Sherrilyn Rist, MD;  Location: [facility];  Service: Gastroenterology;  Laterality: N/A;   ESOPHAGOGASTRODUODENOSCOPY (EGD) WITH PROPOFOL N/A 01/25/2020   Procedure: ESOPHAGOGASTRODUODENOSCOPY (EGD) WITH PROPOFOL;  Surgeon: Sherrilyn Rist, MD;  Location: [facility];  Service: Gastroenterology;  Laterality: N/A;   FRACTURE SURGERY     left ankle   HEMOSTASIS CLIP PLACEMENT  01/25/2020   Procedure: HEMOSTASIS CLIP PLACEMENT;  Surgeon: Sherrilyn Rist, MD;  Location: [facility];  Service: Gastroenterology;;   HOT HEMOSTASIS N/A 01/25/2020   Procedure: HOT HEMOSTASIS (ARGON PLASMA COAGULATION/BICAP);  Surgeon: Sherrilyn Rist, MD;  Location: [facility];  Service: Gastroenterology;  Laterality: N/A;   LUNG BIOPSY N/A 02/27/2015   Procedure: LUNG BIOPSY;  Surgeon: Delight Ovens, MD;  Location: [facility] OR;  Service: Thoracic;  Laterality: N/A;   right inguinal hernia repair at age 74     VIDEO BRONCHOSCOPY WITH ENDOBRONCHIAL ULTRASOUND N/A 02/27/2015   Procedure: VIDEO BRONCHOSCOPY WITH ENDOBRONCHIAL ULTRASOUND;  Surgeon: Delight Ovens, MD;  Location: [facility] OR;  Service: Thoracic;  Laterality: N/A;    REVIEW OF SYSTEMS:   Review of Systems  Constitutional: Negative for appetite change, chills, fatigue, fever and unexpected weight change.  HENT:   Negative for mouth sores, nosebleeds, sore throat and trouble swallowing.   Eyes: Negative for eye problems and icterus.   Respiratory: Negative for cough, hemoptysis, shortness of breath and wheezing.   Cardiovascular: Negative for chest pain and leg swelling.  Gastrointestinal: Negative for abdominal pain, constipation, diarrhea, nausea and vomiting.  Genitourinary: Negative for bladder incontinence, difficulty urinating, dysuria, frequency and hematuria.   Musculoskeletal: Negative for back pain, gait problem, neck pain and neck stiffness.  Skin: Negative for itching and rash.  Neurological: Negative for dizziness, extremity weakness, gait problem, headaches, light-headedness and seizures.  Hematological: Negative for adenopathy. Does not bruise/bleed easily.  Psychiatric/Behavioral: Negative for confusion, depression and sleep disturbance. The patient is not nervous/anxious.     PHYSICAL EXAMINATION:  There were no vitals taken for this visit.  ECOG PERFORMANCE STATUS: {CHL ONC ECOG [id number]  Physical Exam  Constitutional: Oriented to person,  place, and time and well-developed, well-nourished, and in no distress. No distress.  HENT:  Head: Normocephalic and atraumatic.  Mouth/Throat: Oropharynx is clear and moist. No oropharyngeal exudate.  Eyes: Conjunctivae are normal. Right eye exhibits no discharge. Left eye exhibits no discharge. No scleral icterus.  Neck: Normal range of motion. Neck supple.  Cardiovascular: Normal rate, regular rhythm, normal heart sounds and intact distal pulses.   Pulmonary/Chest: Effort normal and breath sounds normal. No respiratory distress. No wheezes. No rales.  Abdominal: Soft. Bowel sounds are normal. Exhibits no distension and no mass. There is no tenderness.  Musculoskeletal: Normal range of motion. Exhibits no edema.  Lymphadenopathy:    No cervical adenopathy.  Neurological: Alert and oriented to person, place, and time. Exhibits normal muscle tone. Gait normal. Coordination normal.  Skin: Skin is warm and dry. No rash noted. Not diaphoretic. No erythema. No  pallor.  Psychiatric: Mood, memory and judgment normal.  Vitals reviewed.  LABORATORY DATA: Lab Results  Component Value Date   WBC 6.6 07/21/2023   HGB 12.5 (L) 07/21/2023   HCT 38.3 (L) 07/21/2023   MCV 80.6 07/21/2023   PLT 281 07/21/2023      Chemistry      Component Value Date/Time   NA 137 07/21/2023 0915   NA 135 01/28/2023 1222   NA 142 06/25/2017 0819   K 4.1 07/21/2023 0915   K 3.5 06/25/2017 0819   CL 98 07/21/2023 0915   CO2 35 (H) 07/21/2023 0915   CO2 27 06/25/2017 0819   BUN 12 07/21/2023 0915   BUN 12 01/28/2023 1222   BUN 8.2 06/25/2017 0819   CREATININE 0.92 07/21/2023 0915   CREATININE 1.1 06/25/2017 0819      Component Value Date/Time   CALCIUM 9.6 07/21/2023 0915   CALCIUM 8.9 06/25/2017 0819   ALKPHOS 82 07/21/2023 0915   ALKPHOS 97 06/25/2017 0819   AST 19 07/21/2023 0915   AST 17 06/25/2017 0819   ALT 11 07/21/2023 0915   ALT 13 06/25/2017 0819   BILITOT 0.5 07/21/2023 0915   BILITOT 0.50 06/25/2017 0819       RADIOGRAPHIC STUDIES:  No results found.   ASSESSMENT/PLAN:  This is a very pleasant 77 year old African-American male with a history of stage IIIa non-small cell lung cancer.  The patient is status post a course of concurrent chemoradiation followed by consolidation immunotherapy with carboplatin and paclitaxel.  He has been on observation since completing his treatment in January 2017.  The patient recently had a restaging CT scan performed.  The patient was seen with Dr. Arbutus Ped today.  Dr. Arbutus Ped personally and independently reviewed the scan and discussed results with the patient today.  The scan showed ***.  Recommend that he ***on observation  We will see him back for follow-up visit in 1 year 1 week after a repeat CT scan the chest.  The patient was advised to call immediately if he has any concerning symptoms in the interval. The patient voices understanding of current disease status and treatment options and is in  agreement with the current care plan. All questions were answered. The patient knows to call the clinic with any problems, questions or concerns. We can certainly see the patient much sooner if necessary   No orders of the defined types were placed in this encounter.    I spent {CHL ONC TIME VISIT - WUJWJ:[id number]} counseling the patient face to face. The total time spent in the appointment was {CHL ONC TIME VISIT -  PPIRJ:[id number]}.  Lailynn Southgate L Dejanee Thibeaux, PA-C 07/26/23

## 2023-07-29 ENCOUNTER — Inpatient Hospital Stay: Payer: Medicare Other | Admitting: Physician Assistant

## 2023-07-29 ENCOUNTER — Ambulatory Visit: Payer: Medicare Other | Admitting: Physician Assistant

## 2023-07-29 DIAGNOSIS — C3411 Malignant neoplasm of upper lobe, right bronchus or lung: Secondary | ICD-10-CM

## 2023-08-02 ENCOUNTER — Ambulatory Visit: Payer: Federal, State, Local not specified - PPO | Admitting: Family

## 2023-08-09 ENCOUNTER — Encounter: Payer: Self-pay | Admitting: Family

## 2023-08-09 ENCOUNTER — Ambulatory Visit (INDEPENDENT_AMBULATORY_CARE_PROVIDER_SITE_OTHER): Payer: Medicare Other | Admitting: Family

## 2023-08-09 ENCOUNTER — Other Ambulatory Visit: Payer: Self-pay | Admitting: Family

## 2023-08-09 VITALS — BP 120/60 | HR 114 | Ht 68.0 in | Wt 174.6 lb

## 2023-08-09 DIAGNOSIS — E782 Mixed hyperlipidemia: Secondary | ICD-10-CM

## 2023-08-09 DIAGNOSIS — I10 Essential (primary) hypertension: Secondary | ICD-10-CM | POA: Diagnosis not present

## 2023-08-09 DIAGNOSIS — Z1159 Encounter for screening for other viral diseases: Secondary | ICD-10-CM | POA: Diagnosis not present

## 2023-08-09 DIAGNOSIS — E559 Vitamin D deficiency, unspecified: Secondary | ICD-10-CM | POA: Diagnosis not present

## 2023-08-09 DIAGNOSIS — R7303 Prediabetes: Secondary | ICD-10-CM

## 2023-08-09 DIAGNOSIS — E538 Deficiency of other specified B group vitamins: Secondary | ICD-10-CM

## 2023-08-09 DIAGNOSIS — Z23 Encounter for immunization: Secondary | ICD-10-CM

## 2023-08-09 DIAGNOSIS — J441 Chronic obstructive pulmonary disease with (acute) exacerbation: Secondary | ICD-10-CM

## 2023-08-09 DIAGNOSIS — R5383 Other fatigue: Secondary | ICD-10-CM

## 2023-08-09 DIAGNOSIS — J9611 Chronic respiratory failure with hypoxia: Secondary | ICD-10-CM

## 2023-08-09 DIAGNOSIS — Z9981 Dependence on supplemental oxygen: Secondary | ICD-10-CM

## 2023-08-09 NOTE — Progress Notes (Unsigned)
Established Patient Office Visit  Subjective:  Patient ID: Colton Nguyen, male    DOB: [birth year]  Age: 77 y.o. MRN: [id number]  No chief complaint on file.   HPI  No other concerns at this time.   Past Medical History:  Diagnosis Date   Acute respiratory failure with hypoxia (HCC) 01/28/2023   Anxiety    situational   Arthritis    BPH (benign prostatic hyperplasia)    CAD (coronary artery disease)    CHF (congestive heart failure) (HCC)    pt denies 11/13/2019   Chronic cardiopulmonary disease (HCC)    COPD (chronic obstructive pulmonary disease) (HCC)    History of kidney stones    Hyperlipidemia    Hypertension    Hypertension 12/28/2016   lung ca dx'd 03/2015   Mass of lung    Nicotine dependence    Personal history of kidney stones    Restless leg    Shortness of breath dyspnea    with exertion   Tuberculosis    Wears glasses     Past Surgical History:  Procedure Laterality Date   BACK SURGERY     BIOPSY  01/25/2020   Procedure: BIOPSY;  Surgeon: Sherrilyn Rist, MD;  Location: [facility];  Service: Gastroenterology;;   CAD CCTA 01/31/15     CERVICAL FUSION     COLONOSCOPY     COLONOSCOPY WITH PROPOFOL N/A 01/25/2020   Procedure: COLONOSCOPY WITH PROPOFOL;  Surgeon: Sherrilyn Rist, MD;  Location: [facility];  Service: Gastroenterology;  Laterality: N/A;   ESOPHAGOGASTRODUODENOSCOPY (EGD) WITH PROPOFOL N/A 01/25/2020   Procedure: ESOPHAGOGASTRODUODENOSCOPY (EGD) WITH PROPOFOL;  Surgeon: Sherrilyn Rist, MD;  Location: [facility];  Service: Gastroenterology;  Laterality: N/A;   FRACTURE SURGERY     left ankle   HEMOSTASIS CLIP PLACEMENT  01/25/2020   Procedure: HEMOSTASIS CLIP PLACEMENT;  Surgeon: Sherrilyn Rist, MD;  Location: [facility];  Service: Gastroenterology;;   HOT HEMOSTASIS N/A 01/25/2020   Procedure: HOT HEMOSTASIS (ARGON PLASMA COAGULATION/BICAP);  Surgeon: Sherrilyn Rist, MD;  Location: [facility];  Service:  Gastroenterology;  Laterality: N/A;   LUNG BIOPSY N/A 02/27/2015   Procedure: LUNG BIOPSY;  Surgeon: Delight Ovens, MD;  Location: [facility] OR;  Service: Thoracic;  Laterality: N/A;   right inguinal hernia repair at age 32     VIDEO BRONCHOSCOPY WITH ENDOBRONCHIAL ULTRASOUND N/A 02/27/2015   Procedure: VIDEO BRONCHOSCOPY WITH ENDOBRONCHIAL ULTRASOUND;  Surgeon: Delight Ovens, MD;  Location: [facility] OR;  Service: Thoracic;  Laterality: N/A;    Social History   Socioeconomic History   Marital status: Divorced    Spouse name: Not on file   Number of children: Not on file   Years of education: Not on file   Highest education level: Not on file  Occupational History   Not on file  Tobacco Use   Smoking status: Every Day    Current packs/day: 1.50    Average packs/day: 1.5 packs/day for 56.5 years (84.7 ttl pk-yrs)    Types: Cigarettes    Start date: 02/14/1967    Passive exposure: Current   Smokeless tobacco: Never  Vaping Use   Vaping status: Former  Substance and Sexual Activity   Alcohol use: Yes    Alcohol/week: 0.0 standard drinks of alcohol    Comment: ocassional   Drug use: No   Sexual activity: Yes  Other Topics Concern   Not on file  Social History Narrative  Not on file   Social Determinants of Health   Financial Resource Strain: Low Risk  (01/30/2023)   Overall Financial Resource Strain (CARDIA)    Difficulty of Paying Living Expenses: Not very hard  Food Insecurity: Not on file  Transportation Needs: No Transportation Needs (01/30/2023)   PRAPARE - Administrator, Civil Service (Medical): No    Lack of Transportation (Non-Medical): No  Physical Activity: Not on file  Stress: Not on file  Social Connections: Unknown (01/20/2022)   [facility] from [facility]   Social Network    Social Network: Not on file  Intimate Partner Violence: Not At Risk (01/30/2023)   Humiliation, Afraid, Rape, and Kick questionnaire    Fear of Current or  Ex-Partner: No    Emotionally Abused: No    Physically Abused: No    Sexually Abused: No    Family History  Problem Relation Age of Onset   Cancer Other    Heart disease Father    Lung cancer Father    Hypertension Other    Diabetes Mother    Valvular heart disease Mother    Diabetes Sister    Colon polyps Maternal Uncle    Diabetes Sister    Diabetes Sister    Diabetes Niece    Colon cancer Cousin    Esophageal cancer Neg Hx    Stomach cancer Neg Hx    Pancreatic cancer Neg Hx     No Known Allergies  ROS     Objective:   BP 120/60   Pulse (!) 114   Ht 5\' 8"  (1.727 m)   Wt 174 lb 9.6 oz (79.2 kg)   SpO2 (!) 87%   BMI 26.55 kg/m   Vitals:   08/09/23 1148  BP: 120/60  Pulse: (!) 114  Height: 5\' 8"  (1.727 m)  Weight: 174 lb 9.6 oz (79.2 kg)  SpO2: (!) 87%  BMI (Calculated): 26.55    Physical Exam   No results found for any visits on 08/09/23.  Recent Results (from the past 2160 hour(s))  CMP (Cancer Center only)     Status: Abnormal   Collection Time: 07/21/23  9:15 AM  Result Value Ref Range   Sodium 137 135 - 145 mmol/L   Potassium 4.1 3.5 - 5.1 mmol/L   Chloride 98 98 - 111 mmol/L   CO2 35 (H) 22 - 32 mmol/L   Glucose, Bld 104 (H) 70 - 99 mg/dL    Comment: Glucose reference range applies only to samples taken after fasting for at least 8 hours.   BUN 12 8 - 23 mg/dL   Creatinine 6.96 2.95 - 1.24 mg/dL   Calcium 9.6 8.9 - 28.4 mg/dL   Total Protein 7.0 6.5 - 8.1 g/dL   Albumin 3.9 3.5 - 5.0 g/dL   AST 19 15 - 41 U/L   ALT 11 0 - 44 U/L   Alkaline Phosphatase 82 38 - 126 U/L   Total Bilirubin 0.5 <1.2 mg/dL   GFR, Estimated >13 >24 mL/min    Comment: (NOTE) Calculated using the CKD-EPI Creatinine Equation (2021)    Anion gap 4 (L) 5 - 15    Comment: Performed at [facility], [address] [place], [place] [address]  CBC with Differential (Cancer Center Only)     Status: Abnormal   Collection Time: 07/21/23   9:15 AM  Result Value Ref Range   WBC Count 6.6 4.0 - 10.5 K/uL  RBC 4.75 4.22 - 5.81 MIL/uL   Hemoglobin 12.5 (L) 13.0 - 17.0 g/dL   HCT 41.6 (L) 60.6 - 30.1 %   MCV 80.6 80.0 - 100.0 fL   MCH 26.3 26.0 - 34.0 pg   MCHC 32.6 30.0 - 36.0 g/dL   RDW 60.1 09.3 - 23.5 %   Platelet Count 281 150 - 400 K/uL   nRBC 0.0 0.0 - 0.2 %   Neutrophils Relative % 73 %   Neutro Abs 4.9 1.7 - 7.7 K/uL   Lymphocytes Relative 16 %   Lymphs Abs 1.1 0.7 - 4.0 K/uL   Monocytes Relative 7 %   Monocytes Absolute 0.5 0.1 - 1.0 K/uL   Eosinophils Relative 3 %   Eosinophils Absolute 0.2 0.0 - 0.5 K/uL   Basophils Relative 1 %   Basophils Absolute 0.0 0.0 - 0.1 K/uL   Immature Granulocytes 0 %   Abs Immature Granulocytes 0.01 0.00 - 0.07 K/uL    Comment: Performed at [facility], [address] [place], [place] [address]       Assessment & Plan:   Problem List Items Addressed This Visit   None   No follow-ups on file.   Total time spent: 20 minutes  Miki Kins, FNP  08/09/2023   This document may have been prepared by [facility] Voice Recognition software and as such may include unintentional dictation errors.

## 2023-08-10 ENCOUNTER — Telehealth: Payer: Self-pay

## 2023-08-10 ENCOUNTER — Other Ambulatory Visit: Payer: Self-pay

## 2023-08-10 LAB — CMP14+EGFR
ALT: 12 [IU]/L (ref 0–44)
AST: 18 [IU]/L (ref 0–40)
Albumin: 3.9 g/dL (ref 3.8–4.8)
Alkaline Phosphatase: 98 [IU]/L (ref 44–121)
BUN/Creatinine Ratio: 14 (ref 10–24)
BUN: 13 mg/dL (ref 8–27)
Bilirubin Total: 0.5 mg/dL (ref 0.0–1.2)
CO2: 28 mmol/L (ref 20–29)
Calcium: 9.4 mg/dL (ref 8.6–10.2)
Chloride: 97 mmol/L (ref 96–106)
Creatinine, Ser: 0.93 mg/dL (ref 0.76–1.27)
Globulin, Total: 2.7 g/dL (ref 1.5–4.5)
Glucose: 115 mg/dL — ABNORMAL HIGH (ref 70–99)
Potassium: 4 mmol/L (ref 3.5–5.2)
Sodium: 141 mmol/L (ref 134–144)
Total Protein: 6.6 g/dL (ref 6.0–8.5)
eGFR: 85 mL/min/{1.73_m2} (ref >59)

## 2023-08-10 LAB — HCV AB W REFLEX TO QUANT PCR: HCV Ab: NONREACTIVE

## 2023-08-10 LAB — LIPID PANEL
Chol/HDL Ratio: 2 {ratio} (ref 0.0–5.0)
Cholesterol, Total: 105 mg/dL (ref 100–199)
HDL: 52 mg/dL (ref >39)
LDL Chol Calc (NIH): 41 mg/dL (ref 0–99)
Triglycerides: 51 mg/dL (ref 0–149)
VLDL Cholesterol Cal: 12 mg/dL (ref 5–40)

## 2023-08-10 LAB — HEMOGLOBIN A1C
Est. average glucose Bld gHb Est-mCnc: 117 mg/dL
Hgb A1c MFr Bld: 5.7 % — ABNORMAL HIGH (ref 4.8–5.6)

## 2023-08-10 LAB — VITAMIN D 25 HYDROXY (VIT D DEFICIENCY, FRACTURES): Vit D, 25-Hydroxy: 59.9 ng/mL (ref 30.0–100.0)

## 2023-08-10 LAB — VITAMIN B12: Vitamin B-12: 404 pg/mL (ref 232–1245)

## 2023-08-10 LAB — TSH: TSH: 0.192 u[IU]/mL — ABNORMAL LOW (ref 0.450–4.500)

## 2023-08-10 LAB — HCV INTERPRETATION

## 2023-08-10 NOTE — Assessment & Plan Note (Signed)
Will get patient new portable oxygen machine.  Patient stable.  Well controlled with current therapy.   Continue current meds.

## 2023-08-10 NOTE — Assessment & Plan Note (Signed)
Patient stable.  Well controlled with current therapy.   Continue current meds.  

## 2023-08-10 NOTE — Assessment & Plan Note (Signed)
A1C Continues to be in prediabetic ranges.  Will reassess at follow up after next lab check.  Patient counseled on dietary choices and verbalized understanding.   

## 2023-08-10 NOTE — Telephone Encounter (Signed)
Marchelle Folks asked me to let you know to order Britt Borst a nebulizer.

## 2023-08-23 ENCOUNTER — Other Ambulatory Visit: Payer: Self-pay | Admitting: Urology

## 2023-08-23 ENCOUNTER — Other Ambulatory Visit: Payer: Self-pay | Admitting: Family

## 2023-08-23 DIAGNOSIS — N400 Enlarged prostate without lower urinary tract symptoms: Secondary | ICD-10-CM

## 2023-09-03 ENCOUNTER — Other Ambulatory Visit: Payer: Self-pay | Admitting: Urology

## 2023-09-03 DIAGNOSIS — N400 Enlarged prostate without lower urinary tract symptoms: Secondary | ICD-10-CM

## 2023-09-14 ENCOUNTER — Telehealth: Payer: Self-pay

## 2023-09-14 NOTE — Telephone Encounter (Signed)
 Patients office note from 08/09/23 needs to state the following  patient has chronic resp failure secondary to COPD, Needs Non Invasive Vent for reduced exacerbation/ hospitalization while sustaining life , they need this done ASAP so patient can get equipment

## 2023-10-12 ENCOUNTER — Other Ambulatory Visit: Payer: Self-pay | Admitting: Urology

## 2023-10-12 DIAGNOSIS — N400 Enlarged prostate without lower urinary tract symptoms: Secondary | ICD-10-CM

## 2023-10-25 ENCOUNTER — Other Ambulatory Visit: Payer: Self-pay | Admitting: Family

## 2023-11-25 ENCOUNTER — Other Ambulatory Visit: Payer: Self-pay | Admitting: Family

## 2023-11-30 ENCOUNTER — Other Ambulatory Visit: Payer: Self-pay | Admitting: Family

## 2023-12-14 ENCOUNTER — Ambulatory Visit (INDEPENDENT_AMBULATORY_CARE_PROVIDER_SITE_OTHER): Admitting: Family

## 2023-12-14 ENCOUNTER — Encounter: Payer: Self-pay | Admitting: Family

## 2023-12-14 VITALS — BP 124/68 | HR 115 | Ht 68.0 in

## 2023-12-14 DIAGNOSIS — N401 Enlarged prostate with lower urinary tract symptoms: Secondary | ICD-10-CM

## 2023-12-14 DIAGNOSIS — R946 Abnormal results of thyroid function studies: Secondary | ICD-10-CM | POA: Diagnosis not present

## 2023-12-14 DIAGNOSIS — Z0130 Encounter for examination of blood pressure without abnormal findings: Secondary | ICD-10-CM

## 2023-12-14 DIAGNOSIS — C3411 Malignant neoplasm of upper lobe, right bronchus or lung: Secondary | ICD-10-CM | POA: Diagnosis not present

## 2023-12-14 DIAGNOSIS — R3912 Poor urinary stream: Secondary | ICD-10-CM

## 2023-12-14 DIAGNOSIS — J432 Centrilobular emphysema: Secondary | ICD-10-CM

## 2023-12-14 DIAGNOSIS — N400 Enlarged prostate without lower urinary tract symptoms: Secondary | ICD-10-CM

## 2023-12-14 MED ORDER — AMLODIPINE BESYLATE 10 MG PO TABS: 10.0000 mg | ORAL_TABLET | Freq: Every day | ORAL | 3 refills | Status: DC

## 2023-12-14 MED ORDER — LOSARTAN POTASSIUM-HCTZ 100-12.5 MG PO TABS: 1.0000 | ORAL_TABLET | Freq: Every day | ORAL | 3 refills | Status: DC

## 2023-12-14 MED ORDER — FINASTERIDE 5 MG PO TABS
5.0000 mg | ORAL_TABLET | Freq: Every day | ORAL | 3 refills | Status: DC
Start: 2023-12-14 — End: 2024-07-30

## 2023-12-14 MED ORDER — FERROUS SULFATE 325 (65 FE) MG PO TABS: 325.0000 mg | ORAL_TABLET | Freq: Every day | ORAL | 1 refills | Status: AC

## 2023-12-14 MED ORDER — ATORVASTATIN CALCIUM 40 MG PO TABS: 40.0000 mg | ORAL_TABLET | Freq: Every day | ORAL | 3 refills | Status: AC

## 2023-12-14 MED ORDER — TRELEGY ELLIPTA 100-62.5-25 MCG/ACT IN AEPB: 1.0000 | INHALATION_SPRAY | Freq: Every day | RESPIRATORY_TRACT | 11 refills | Status: DC

## 2023-12-14 MED ORDER — TAMSULOSIN HCL 0.4 MG PO CAPS: 0.4000 mg | ORAL_CAPSULE | Freq: Every day | ORAL | 0 refills | Status: DC

## 2023-12-14 MED ORDER — AZELASTINE HCL 0.1 % NA SOLN: 1.0000 | Freq: Two times a day (BID) | NASAL | 0 refills | Status: DC

## 2023-12-14 NOTE — Progress Notes (Signed)
 Established Patient Office Visit  Subjective:  Patient ID: Colton Nguyen, male    DOB: 04/11/1946  Age: 78 y.o. MRN: [id number]  Chief Complaint  Patient presents with   Follow-up    Thyroid check    Patient is here today for his follow up.  He has been feeling fairly well since last appointment.   He does have additional concerns to discuss today.  1) He needs a letter - oxygen does not affect his driving 2) Labs - recheck TSH, add T3, T4 3) COPD: Made sure he has everything he needed 4) Needs refills for his finasteride  and tamsulosin , urology asked that we take over as he will likely die of something else before the prostate cancer  Labs are due today. He needs refills.   I have reviewed his active problem list, medication list, allergies, notes from last encounter, lab results for his appointment today.      No other concerns at this time.   Past Medical History:  Diagnosis Date   Acute respiratory failure with hypoxia (HCC) 01/28/2023   Anxiety    situational   Arthritis    BPH (benign prostatic hyperplasia)    CAD (coronary artery disease)    CHF (congestive heart failure) (HCC)    pt denies 11/13/2019   Chronic cardiopulmonary disease (HCC)    COPD (chronic obstructive pulmonary disease) (HCC)    History of kidney stones    Hyperlipidemia    Hypertension    Hypertension 12/28/2016   lung ca dx'd 03/2015   Mass of lung    Nicotine  dependence    Personal history of kidney stones    Restless leg    Shortness of breath dyspnea    with exertion   Tuberculosis    Wears glasses     Past Surgical History:  Procedure Laterality Date   BACK SURGERY     BIOPSY  01/25/2020   Procedure: BIOPSY;  Surgeon: Legrand Victory LITTIE DOUGLAS, MD;  Location: [facility];  Service: Gastroenterology;;   CAD CCTA 01/31/15     CERVICAL FUSION     COLONOSCOPY     COLONOSCOPY WITH PROPOFOL  N/A 01/25/2020   Procedure: COLONOSCOPY WITH PROPOFOL ;  Surgeon: Legrand Victory LITTIE DOUGLAS, MD;   Location: [facility];  Service: Gastroenterology;  Laterality: N/A;   ESOPHAGOGASTRODUODENOSCOPY (EGD) WITH PROPOFOL  N/A 01/25/2020   Procedure: ESOPHAGOGASTRODUODENOSCOPY (EGD) WITH PROPOFOL ;  Surgeon: Legrand Victory LITTIE DOUGLAS, MD;  Location: [facility];  Service: Gastroenterology;  Laterality: N/A;   FRACTURE SURGERY     left ankle   HEMOSTASIS CLIP PLACEMENT  01/25/2020   Procedure: HEMOSTASIS CLIP PLACEMENT;  Surgeon: Legrand Victory LITTIE DOUGLAS, MD;  Location: [facility];  Service: Gastroenterology;;   HOT HEMOSTASIS N/A 01/25/2020   Procedure: HOT HEMOSTASIS (ARGON PLASMA COAGULATION/BICAP);  Surgeon: Legrand Victory LITTIE DOUGLAS, MD;  Location: [facility];  Service: Gastroenterology;  Laterality: N/A;   LUNG BIOPSY N/A 02/27/2015   Procedure: LUNG BIOPSY;  Surgeon: Dallas KATHEE Jude, MD;  Location: [facility] OR;  Service: Thoracic;  Laterality: N/A;   right inguinal hernia repair at age 11     VIDEO BRONCHOSCOPY WITH ENDOBRONCHIAL ULTRASOUND N/A 02/27/2015   Procedure: VIDEO BRONCHOSCOPY WITH ENDOBRONCHIAL ULTRASOUND;  Surgeon: Dallas KATHEE Jude, MD;  Location: [facility];  Service: Thoracic;  Laterality: N/A;    Social History   Socioeconomic History   Marital status: Divorced    Spouse name: Not on file   Number of children: Not on file   Years  of education: Not on file   Highest education level: Not on file  Occupational History   Not on file  Tobacco Use   Smoking status: Every Day    Current packs/day: 1.50    Average packs/day: 1.5 packs/day for 57.1 years (85.6 ttl pk-yrs)    Types: Cigarettes    Start date: 02/14/1967    Passive exposure: Current   Smokeless tobacco: Never  Vaping Use   Vaping status: Former  Substance and Sexual Activity   Alcohol use: Yes    Alcohol/week: 0.0 standard drinks of alcohol    Comment: ocassional   Drug use: No   Sexual activity: Yes  Other Topics Concern   Not on file  Social History Narrative   Not on file   Social Drivers of Health   Financial Resource  Strain: Low Risk  (01/30/2023)   Overall Financial Resource Strain (CARDIA)    Difficulty of Paying Living Expenses: Not very hard  Food Insecurity: Not on file  Transportation Needs: No Transportation Needs (01/30/2023)   PRAPARE - Transportation    Lack of Transportation (Medical): No    Lack of Transportation (Non-Medical): No  Physical Activity: Not on file  Stress: Not on file  Social Connections: Unknown (01/20/2022)   Received from [facility]   Social Network    Social Network: Not on file  Intimate Partner Violence: Not At Risk (01/30/2023)   Humiliation, Afraid, Rape, and Kick questionnaire    Fear of Current or Ex-Partner: No    Emotionally Abused: No    Physically Abused: No    Sexually Abused: No    Family History  Problem Relation Age of Onset   Cancer Other    Heart disease Father    Lung cancer Father    Hypertension Other    Diabetes Mother    Valvular heart disease Mother    Diabetes Sister    Colon polyps Maternal Uncle    Diabetes Sister    Diabetes Sister    Diabetes Niece    Colon cancer Cousin    Esophageal cancer Neg Hx    Stomach cancer Neg Hx    Pancreatic cancer Neg Hx     No Known Allergies  Review of Systems  Respiratory:  Positive for shortness of breath and wheezing.   All other systems reviewed and are negative.      Objective:   BP 124/68   Pulse (!) 115   Ht 5' 8 (1.727 m)   SpO2 (!) 89% Comment: 2L  BMI 26.55 kg/m   Vitals:   12/14/23 1009  BP: 124/68  Pulse: (!) 115  Height: 5' 8 (1.727 m)  Weight: Comment: unable to weigh  SpO2: (!) 89% Comment: 2L    Physical Exam Vitals and nursing note reviewed.  Constitutional:      Appearance: Normal appearance. He is normal weight.  Eyes:     Extraocular Movements: Extraocular movements intact.     Conjunctiva/sclera: Conjunctivae normal.     Pupils: Pupils are equal, round, and reactive to light.  Cardiovascular:     Rate and Rhythm: Normal rate and regular  rhythm.     Pulses: Normal pulses.     Heart sounds: Normal heart sounds.  Pulmonary:     Effort: Pulmonary effort is normal.     Breath sounds: Wheezing and rhonchi present.  Musculoskeletal:     Cervical back: Normal range of motion.  Neurological:     General: No focal deficit present.  Mental Status: He is alert and oriented to person, place, and time. Mental status is at baseline.  Psychiatric:        Mood and Affect: Mood normal.        Behavior: Behavior normal.        Thought Content: Thought content normal.        Judgment: Judgment normal.      Results for orders placed or performed in visit on 12/14/23  TSH+T4F+T3Free  Result Value Ref Range   TSH 0.604 0.450 - 4.500 uIU/mL   T3, Free 3.2 2.0 - 4.4 pg/mL   Free T4 1.34 0.82 - 1.77 ng/dL    Recent Results (from the past 2160 hours)  TSH+T4F+T3Free     Status: None   Collection Time: 12/14/23 10:42 AM  Result Value Ref Range   TSH 0.604 0.450 - 4.500 uIU/mL   T3, Free 3.2 2.0 - 4.4 pg/mL   Free T4 1.34 0.82 - 1.77 ng/dL       Assessment & Plan Abnormal thyroid function test Checking TSH, T3 and T4 today in office. Will call pt with results when available.  BPH (benign prostatic hyperplasia) Sending refills for his BPH meds today. Will reassess at follow up.   Primary cancer of right upper lobe of lung [facility]) Patient is seen by Oncology, who manage this condition.  He is well controlled with current therapy.   Will defer to them for further changes to plan of care.  Centrilobular emphysema (HCC) Patient is seen by Pulmonology, who manage this condition.  He is well controlled with current therapy.   Will defer to them for further changes to plan of care.     Return in about 4 months (around 04/14/2024).   Total time spent: 20 minutes  ALAN CHRISTELLA ARRANT, FNP  12/14/2023   This document may have been prepared by [facility] Voice Recognition software and as such may include unintentional dictation  errors.

## 2023-12-15 LAB — TSH+T4F+T3FREE
Free T4: 1.34 ng/dL (ref 0.82–1.77)
T3, Free: 3.2 pg/mL (ref 2.0–4.4)
TSH: 0.604 u[IU]/mL (ref 0.450–4.500)

## 2023-12-31 NOTE — Progress Notes (Signed)
 Patient notified

## 2024-01-25 ENCOUNTER — Ambulatory Visit: Admitting: Family

## 2024-02-07 ENCOUNTER — Ambulatory Visit: Payer: Federal, State, Local not specified - PPO | Admitting: Family

## 2024-02-09 ENCOUNTER — Other Ambulatory Visit: Payer: Self-pay | Admitting: Family

## 2024-02-14 ENCOUNTER — Ambulatory Visit: Admitting: Family

## 2024-03-11 ENCOUNTER — Encounter: Payer: Self-pay | Admitting: Family

## 2024-03-11 NOTE — Assessment & Plan Note (Signed)
 Patient is seen by Pulmonology, who manage this condition.  He is well controlled with current therapy.   Will defer to them for further changes to plan of care.

## 2024-03-11 NOTE — Assessment & Plan Note (Signed)
 Sending refills for his BPH meds today. Will reassess at follow up.

## 2024-03-11 NOTE — Assessment & Plan Note (Signed)
 Patient is seen by Oncology, who manage this condition.  He is well controlled with current therapy.   Will defer to them for further changes to plan of care.

## 2024-03-27 ENCOUNTER — Ambulatory Visit (INDEPENDENT_AMBULATORY_CARE_PROVIDER_SITE_OTHER): Admitting: Family

## 2024-03-27 VITALS — BP 152/70 | HR 88 | Ht 68.0 in

## 2024-03-27 DIAGNOSIS — R7303 Prediabetes: Secondary | ICD-10-CM

## 2024-03-27 DIAGNOSIS — Z1159 Encounter for screening for other viral diseases: Secondary | ICD-10-CM

## 2024-03-27 DIAGNOSIS — J439 Emphysema, unspecified: Secondary | ICD-10-CM

## 2024-03-27 DIAGNOSIS — N3942 Incontinence without sensory awareness: Secondary | ICD-10-CM

## 2024-03-27 DIAGNOSIS — E782 Mixed hyperlipidemia: Secondary | ICD-10-CM

## 2024-03-27 DIAGNOSIS — Z23 Encounter for immunization: Secondary | ICD-10-CM

## 2024-03-27 DIAGNOSIS — I10 Essential (primary) hypertension: Secondary | ICD-10-CM

## 2024-03-27 DIAGNOSIS — M5441 Lumbago with sciatica, right side: Secondary | ICD-10-CM

## 2024-03-27 DIAGNOSIS — M5442 Lumbago with sciatica, left side: Secondary | ICD-10-CM

## 2024-03-27 DIAGNOSIS — C3411 Malignant neoplasm of upper lobe, right bronchus or lung: Secondary | ICD-10-CM

## 2024-03-27 DIAGNOSIS — E559 Vitamin D deficiency, unspecified: Secondary | ICD-10-CM

## 2024-03-27 DIAGNOSIS — R5383 Other fatigue: Secondary | ICD-10-CM

## 2024-03-27 DIAGNOSIS — M150 Primary generalized (osteo)arthritis: Secondary | ICD-10-CM

## 2024-03-27 DIAGNOSIS — G8929 Other chronic pain: Secondary | ICD-10-CM

## 2024-03-27 DIAGNOSIS — E538 Deficiency of other specified B group vitamins: Secondary | ICD-10-CM

## 2024-03-27 NOTE — Progress Notes (Unsigned)
 Established Patient Office Visit  Subjective:  Patient ID: Colton Nguyen, male    DOB: Feb 16, 1946  Age: 78 y.o. MRN: [id number]  Chief Complaint  Patient presents with   Follow-up    3 month follow up    Patient is here today for his 3 months follow up.  He has been feeling poorly since last appointment.   He does have additional concerns to discuss today.  Has lost a lot of weight, is looking much more cachectic and has essentially no appetite.  Has to walk with a walker all the time, has had a few falls, left sided back pain.  Has also been having some trouble with getting to the bathroom on time.   His siblings have been trying to help, but they are unable to do this all the time, and he needs more assistance than they are able to provide.  He also needs physical therapy for strength and endurance, as well as possibly occupational therapy and social work if possible.   Would also benefit from nursing, as he has been forgetting to take his meds, says he takes them every day but sister does not think this is the case.  Feet are swelling.  Temperature regulation issues.   He needs a lift recliner and a wheelchair to help with mobility in home and while out when he is able to do so.  He has difficulty standing due to his lower back pain (which is chronic) and   Patient is also having urinary incontinence.  The caregivers ask if we can work on getting him some pull ups, size small, some exam gloves, and some wipes to help protect his skin.  He needs to be changed approximately 4 times daily. He is likely to need these supplies permanently, as his mentation is unlikely to improve. Will need 120 pull ups monthly.   He needs refills.   I have reviewed his active problem list, medication list, allergies, notes from last encounter, lab results for his appointment today.    Extremity Weakness  The pain is present in the left lower leg, left knee, left upper leg, left hip, right  lower leg, right knee, right upper leg and right hip. This is a chronic problem. The current episode started more than 1 year ago. The problem occurs constantly. The problem has been gradually worsening. Associated symptoms include a limited range of motion and tingling. He has tried OTC pain meds, OTC ointments and movement for the symptoms. The treatment provided no relief. His past medical history is significant for osteoarthritis.    No other concerns at this time.   Past Medical History:  Diagnosis Date   Acute respiratory failure with hypoxia (HCC) 01/28/2023   Anxiety    situational   Arthritis    BPH (benign prostatic hyperplasia)    CAD (coronary artery disease)    CHF (congestive heart failure) (HCC)    pt denies 11/13/2019   Chronic cardiopulmonary disease (HCC)    COPD (chronic obstructive pulmonary disease) (HCC)    History of kidney stones    Hyperlipidemia    Hypertension    Hypertension 12/28/2016   lung ca dx'd 03/2015   Mass of lung    Nicotine  dependence    Personal history of kidney stones    Restless leg    Shortness of breath dyspnea    with exertion   Tuberculosis    Wears glasses     Past Surgical History:  Procedure Laterality  Date   BACK SURGERY     BIOPSY  01/25/2020   Procedure: BIOPSY;  Surgeon: Legrand Victory LITTIE DOUGLAS, MD;  Location: [facility];  Service: Gastroenterology;;   CAD CCTA 01/31/15     CERVICAL FUSION     COLONOSCOPY     COLONOSCOPY WITH PROPOFOL  N/A 01/25/2020   Procedure: COLONOSCOPY WITH PROPOFOL ;  Surgeon: Legrand Victory LITTIE DOUGLAS, MD;  Location: [facility];  Service: Gastroenterology;  Laterality: N/A;   ESOPHAGOGASTRODUODENOSCOPY (EGD) WITH PROPOFOL  N/A 01/25/2020   Procedure: ESOPHAGOGASTRODUODENOSCOPY (EGD) WITH PROPOFOL ;  Surgeon: Legrand Victory LITTIE DOUGLAS, MD;  Location: [facility];  Service: Gastroenterology;  Laterality: N/A;   FRACTURE SURGERY     left ankle   HEMOSTASIS CLIP PLACEMENT  01/25/2020   Procedure: HEMOSTASIS CLIP  PLACEMENT;  Surgeon: Legrand Victory LITTIE DOUGLAS, MD;  Location: [facility];  Service: Gastroenterology;;   HOT HEMOSTASIS N/A 01/25/2020   Procedure: HOT HEMOSTASIS (ARGON PLASMA COAGULATION/BICAP);  Surgeon: Legrand Victory LITTIE DOUGLAS, MD;  Location: [facility];  Service: Gastroenterology;  Laterality: N/A;   LUNG BIOPSY N/A 02/27/2015   Procedure: LUNG BIOPSY;  Surgeon: [place] KATHEE Jude, MD;  Location: [facility] OR;  Service: Thoracic;  Laterality: N/A;   right inguinal hernia repair at age 61     VIDEO BRONCHOSCOPY WITH ENDOBRONCHIAL ULTRASOUND N/A 02/27/2015   Procedure: VIDEO BRONCHOSCOPY WITH ENDOBRONCHIAL ULTRASOUND;  Surgeon: Dallas KATHEE Jude, MD;  Location: [facility] OR;  Service: Thoracic;  Laterality: N/A;    Social History   Socioeconomic History   Marital status: Divorced    Spouse name: Not on file   Number of children: Not on file   Years of education: Not on file   Highest education level: Not on file  Occupational History   Not on file  Tobacco Use   Smoking status: Every Day    Current packs/day: 1.50    Average packs/day: 1.5 packs/day for 57.1 years (85.7 ttl pk-yrs)    Types: Cigarettes    Start date: 02/14/1967    Passive exposure: Current   Smokeless tobacco: Never  Vaping Use   Vaping status: Former  Substance and Sexual Activity   Alcohol use: Yes    Alcohol/week: 0.0 standard drinks of alcohol    Comment: ocassional   Drug use: No   Sexual activity: Yes  Other Topics Concern   Not on file  Social History Narrative   Not on file   Social Drivers of Health   Financial Resource Strain: Low Risk  (01/30/2023)   Overall Financial Resource Strain (CARDIA)    Difficulty of Paying Living Expenses: Not very hard  Food Insecurity: Not on file  Transportation Needs: No Transportation Needs (01/30/2023)   PRAPARE - Transportation    Lack of Transportation (Medical): No    Lack of Transportation (Non-Medical): No  Physical Activity: Not on file  Stress: Not on file  Social  Connections: Unknown (01/20/2022)   [facility]   Social Network    Social Network: Not on file  Intimate Partner Violence: Not At Risk (01/30/2023)   Humiliation, Afraid, Rape, and Kick questionnaire    Fear of Current or Ex-Partner: No    Emotionally Abused: No    Physically Abused: No    Sexually Abused: No    Family History  Problem Relation Age of Onset   Cancer Other    Heart disease Father    Lung cancer Father    Hypertension Other    Diabetes Mother    Valvular  heart disease Mother    Diabetes Sister    Colon polyps Maternal Uncle    Diabetes Sister    Diabetes Sister    Diabetes Niece    Colon cancer Cousin    Esophageal cancer Neg Hx    Stomach cancer Neg Hx    Pancreatic cancer Neg Hx     No Known Allergies  Review of Systems  Constitutional:  Positive for malaise/fatigue and weight loss.  Respiratory:  Positive for shortness of breath and wheezing.   Cardiovascular:  Positive for leg swelling.  Musculoskeletal:  Positive for back pain, extremity weakness, falls, joint pain and myalgias.  Neurological:  Positive for tingling, sensory change and weakness.  Psychiatric/Behavioral:  Positive for memory loss.   All other systems reviewed and are negative.      Objective:   BP (!) 152/70   Pulse 88   Ht 5' 8 (1.727 m)   SpO2 98%   BMI 26.55 kg/m   Vitals:   03/27/24 1030  BP: (!) 152/70  Pulse: 88  Height: 5' 8 (1.727 m)  SpO2: 98%    Physical Exam Vitals and nursing note reviewed.  Constitutional:      General: He is not in acute distress.    Appearance: Normal appearance. He is normal weight. He is ill-appearing. He is not toxic-appearing.  Eyes:     Extraocular Movements: Extraocular movements intact.     Conjunctiva/sclera: Conjunctivae normal.     Pupils: Pupils are equal, round, and reactive to light.  Cardiovascular:     Rate and Rhythm: Normal rate and regular rhythm.     Pulses: Normal pulses.     Heart sounds:  Normal heart sounds.  Pulmonary:     Effort: Pulmonary effort is normal.     Breath sounds: Wheezing present.  Musculoskeletal:        General: Swelling present.     Cervical back: Tenderness present.     Right lower leg: Edema present.     Left lower leg: Edema present.  Neurological:     General: No focal deficit present.     Mental Status: He is alert.     Motor: Weakness present.     Gait: Gait abnormal.  Psychiatric:        Mood and Affect: Mood normal.        Behavior: Behavior normal.        Thought Content: Thought content normal.        Judgment: Judgment normal.      No results found for any visits on 03/27/24.  No results found for this or any previous visit (from the past 2160 hours).     Assessment & Plan Vitamin D  deficiency, unspecified B12 deficiency due to diet Other fatigue Checking labs today.  Will continue supplements as needed.   - Vitamin D  - Vitamin B12 - TSH  Essential hypertension, benign Blood pressure well controlled with current medications.  Continue current therapy.  Will reassess at follow up.   - CBC w/Diff - CMP w/eGFR  Prediabetes A1C Continues to be in prediabetic ranges.  Will reassess at follow up after next lab check.  Patient counseled on dietary choices and verbalized understanding.   -CBC w/Diff -CMP w/eGFR -Hemoglobin A1C  Mixed hyperlipidemia Checking labs today.  Continue current therapy for lipid control. Will modify as needed based on labwork results.   -CMP w/eGFR -Lipid Panel  Pulmonary emphysema, unspecified emphysema type (HCC) Will send order for [facility] for patient.  Also getting him Wheelchair and Lift Chair to help with mobility.   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  Also getting him Wheelchair and Lift Chair to help with mobility.   Primary cancer of right upper lobe of lung [facility]) Patient  is seen by oncology, who manage this condition.  He is well controlled with current therapy.   Will defer to them for further changes to plan of care.  Urinary incontinence without sensory awareness Will send orders for urinary incontinence supplies, which are medically necessary for patient to prevent deterioration of his condition.     Return in about 3 months (around 06/27/2024) for F/U.   Total time spent: 30 minutes  ALAN CHRISTELLA ARRANT, FNP  03/27/2024   This document may have been prepared by [facility] Voice Recognition software and as such may include unintentional dictation errors.

## 2024-03-30 ENCOUNTER — Inpatient Hospital Stay
Admission: EM | Admit: 2024-03-30 | Discharge: 2024-04-04 | DRG: 189 | Disposition: A | Discharge status: Skilled Nursing Facility | Attending: Family Medicine | Admitting: Family Medicine

## 2024-03-30 ENCOUNTER — Emergency Department

## 2024-03-30 ENCOUNTER — Other Ambulatory Visit: Payer: Self-pay

## 2024-03-30 ENCOUNTER — Encounter: Payer: Self-pay | Admitting: Emergency Medicine

## 2024-03-30 DIAGNOSIS — R6889 Other general symptoms and signs: Secondary | ICD-10-CM

## 2024-03-30 DIAGNOSIS — C3410 Malignant neoplasm of upper lobe, unspecified bronchus or lung: Secondary | ICD-10-CM | POA: Diagnosis present

## 2024-03-30 DIAGNOSIS — E876 Hypokalemia: Secondary | ICD-10-CM | POA: Diagnosis not present

## 2024-03-30 DIAGNOSIS — E43 Unspecified severe protein-calorie malnutrition: Secondary | ICD-10-CM | POA: Diagnosis present

## 2024-03-30 DIAGNOSIS — Z83719 Family history of colon polyps, unspecified: Secondary | ICD-10-CM

## 2024-03-30 DIAGNOSIS — I2510 Atherosclerotic heart disease of native coronary artery without angina pectoris: Secondary | ICD-10-CM | POA: Diagnosis present

## 2024-03-30 DIAGNOSIS — M159 Polyosteoarthritis, unspecified: Secondary | ICD-10-CM | POA: Diagnosis present

## 2024-03-30 DIAGNOSIS — Z91148 Patient's other noncompliance with medication regimen for other reason: Secondary | ICD-10-CM | POA: Diagnosis not present

## 2024-03-30 DIAGNOSIS — Z923 Personal history of irradiation: Secondary | ICD-10-CM

## 2024-03-30 DIAGNOSIS — Z8249 Family history of ischemic heart disease and other diseases of the circulatory system: Secondary | ICD-10-CM | POA: Diagnosis not present

## 2024-03-30 DIAGNOSIS — Z9221 Personal history of antineoplastic chemotherapy: Secondary | ICD-10-CM | POA: Diagnosis not present

## 2024-03-30 DIAGNOSIS — E782 Mixed hyperlipidemia: Secondary | ICD-10-CM | POA: Diagnosis present

## 2024-03-30 DIAGNOSIS — J690 Pneumonitis due to inhalation of food and vomit: Principal | ICD-10-CM | POA: Diagnosis present

## 2024-03-30 DIAGNOSIS — G8929 Other chronic pain: Secondary | ICD-10-CM | POA: Diagnosis present

## 2024-03-30 DIAGNOSIS — Z800 Family history of malignant neoplasm of digestive organs: Secondary | ICD-10-CM

## 2024-03-30 DIAGNOSIS — J449 Chronic obstructive pulmonary disease, unspecified: Secondary | ICD-10-CM | POA: Diagnosis present

## 2024-03-30 DIAGNOSIS — R7303 Prediabetes: Secondary | ICD-10-CM | POA: Diagnosis present

## 2024-03-30 DIAGNOSIS — G2581 Restless legs syndrome: Secondary | ICD-10-CM | POA: Diagnosis present

## 2024-03-30 DIAGNOSIS — F0150 Vascular dementia without behavioral disturbance: Secondary | ICD-10-CM | POA: Diagnosis present

## 2024-03-30 DIAGNOSIS — Z7982 Long term (current) use of aspirin: Secondary | ICD-10-CM

## 2024-03-30 DIAGNOSIS — E874 Mixed disorder of acid-base balance: Secondary | ICD-10-CM | POA: Diagnosis present

## 2024-03-30 DIAGNOSIS — N401 Enlarged prostate with lower urinary tract symptoms: Secondary | ICD-10-CM | POA: Diagnosis present

## 2024-03-30 DIAGNOSIS — R296 Repeated falls: Secondary | ICD-10-CM | POA: Diagnosis present

## 2024-03-30 DIAGNOSIS — Z801 Family history of malignant neoplasm of trachea, bronchus and lung: Secondary | ICD-10-CM

## 2024-03-30 DIAGNOSIS — J9622 Acute and chronic respiratory failure with hypercapnia: Secondary | ICD-10-CM | POA: Diagnosis present

## 2024-03-30 DIAGNOSIS — J189 Pneumonia, unspecified organism: Secondary | ICD-10-CM

## 2024-03-30 DIAGNOSIS — Z6821 Body mass index (BMI) 21.0-21.9, adult: Secondary | ICD-10-CM

## 2024-03-30 DIAGNOSIS — J441 Chronic obstructive pulmonary disease with (acute) exacerbation: Principal | ICD-10-CM | POA: Diagnosis present

## 2024-03-30 DIAGNOSIS — J9621 Acute and chronic respiratory failure with hypoxia: Secondary | ICD-10-CM | POA: Diagnosis present

## 2024-03-30 DIAGNOSIS — I10 Essential (primary) hypertension: Secondary | ICD-10-CM | POA: Diagnosis present

## 2024-03-30 DIAGNOSIS — Z833 Family history of diabetes mellitus: Secondary | ICD-10-CM

## 2024-03-30 DIAGNOSIS — Z87442 Personal history of urinary calculi: Secondary | ICD-10-CM

## 2024-03-30 DIAGNOSIS — F17210 Nicotine dependence, cigarettes, uncomplicated: Secondary | ICD-10-CM | POA: Diagnosis present

## 2024-03-30 DIAGNOSIS — N3942 Incontinence without sensory awareness: Secondary | ICD-10-CM | POA: Diagnosis present

## 2024-03-30 LAB — CBC WITH DIFFERENTIAL/PLATELET
Abs Immature Granulocytes: 0.02 K/uL (ref 0.00–0.07)
Basophils Absolute: 0 K/uL (ref 0.0–0.1)
Basophils Relative: 0 %
Eosinophils Absolute: 0.1 K/uL (ref 0.0–0.5)
Eosinophils Relative: 1 %
HCT: 34.7 % — ABNORMAL LOW (ref 39.0–52.0)
Hemoglobin: 10.4 g/dL — ABNORMAL LOW (ref 13.0–17.0)
Immature Granulocytes: 0 %
Lymphocytes Relative: 14 %
Lymphs Abs: 1.1 K/uL (ref 0.7–4.0)
MCH: 24 pg — ABNORMAL LOW (ref 26.0–34.0)
MCHC: 30 g/dL (ref 30.0–36.0)
MCV: 80 fL (ref 80.0–100.0)
Monocytes Absolute: 0.6 K/uL (ref 0.1–1.0)
Monocytes Relative: 8 %
Neutro Abs: 5.8 K/uL (ref 1.7–7.7)
Neutrophils Relative %: 77 %
Platelets: 215 K/uL (ref 150–400)
RBC: 4.34 MIL/uL (ref 4.22–5.81)
RDW: 18.1 % — ABNORMAL HIGH (ref 11.5–15.5)
WBC: 7.6 K/uL (ref 4.0–10.5)
nRBC: 0 % (ref 0.0–0.2)

## 2024-03-30 LAB — COMPREHENSIVE METABOLIC PANEL WITH GFR
ALT: 18 U/L (ref 0–44)
AST: 26 U/L (ref 15–41)
Albumin: 3 g/dL — ABNORMAL LOW (ref 3.5–5.0)
Alkaline Phosphatase: 67 U/L (ref 38–126)
Anion gap: 11 (ref 5–15)
BUN: 17 mg/dL (ref 8–23)
CO2: 34 mmol/L — ABNORMAL HIGH (ref 22–32)
Calcium: 8.5 mg/dL — ABNORMAL LOW (ref 8.9–10.3)
Chloride: 93 mmol/L — ABNORMAL LOW (ref 98–111)
Creatinine, Ser: 0.89 mg/dL (ref 0.61–1.24)
GFR, Estimated: >60 mL/min (ref >60)
Glucose, Bld: 95 mg/dL (ref 70–99)
Potassium: 4.3 mmol/L (ref 3.5–5.1)
Sodium: 138 mmol/L (ref 135–145)
Total Bilirubin: 0.6 mg/dL (ref 0.0–1.2)
Total Protein: 6.1 g/dL — ABNORMAL LOW (ref 6.5–8.1)

## 2024-03-30 LAB — BLOOD GAS, VENOUS
Acid-Base Excess: 15.6 mmol/L — ABNORMAL HIGH (ref 0.0–2.0)
Bicarbonate: 42.2 mmol/L — ABNORMAL HIGH (ref 20.0–28.0)
Delivery systems: POSITIVE
O2 Saturation: 94 %
Patient temperature: 37
pCO2, Ven: 58 mmHg (ref 44–60)
pH, Ven: 7.47 — ABNORMAL HIGH (ref 7.25–7.43)
pO2, Ven: 62 mmHg — ABNORMAL HIGH (ref 32–45)

## 2024-03-30 LAB — BRAIN NATRIURETIC PEPTIDE: B Natriuretic Peptide: 62.3 pg/mL (ref 0.0–100.0)

## 2024-03-30 LAB — TSH: TSH: 0.814 u[IU]/mL (ref 0.350–4.500)

## 2024-03-30 LAB — D-DIMER, QUANTITATIVE: D-Dimer, Quant: 1.43 ug{FEU}/mL — ABNORMAL HIGH (ref 0.00–0.50)

## 2024-03-30 LAB — LACTIC ACID, PLASMA: Lactic Acid, Venous: 0.9 mmol/L (ref 0.5–1.9)

## 2024-03-30 LAB — PROTIME-INR
INR: 1.1 (ref 0.8–1.2)
Prothrombin Time: 14.6 s (ref 11.4–15.2)

## 2024-03-30 LAB — TROPONIN I (HIGH SENSITIVITY): Troponin I (High Sensitivity): 8 ng/L (ref <18)

## 2024-03-30 MED ORDER — LOSARTAN POTASSIUM 50 MG PO TABS
50.0000 mg | ORAL_TABLET | Freq: Every day | ORAL | Status: DC
  Administered 2024-03-30 – 2024-04-04 (×6): 50 mg via ORAL
  Filled 2024-03-30 (×6): qty 1

## 2024-03-30 MED ORDER — HYDRALAZINE HCL 20 MG/ML IJ SOLN
5.0000 mg | Freq: Four times a day (QID) | INTRAMUSCULAR | Status: DC | PRN
  Administered 2024-03-31: 5 mg via INTRAVENOUS
  Filled 2024-03-30: qty 1

## 2024-03-30 MED ORDER — FLUTICASONE PROPIONATE 50 MCG/ACT NA SUSP: 2.0000 | Freq: Every day | NASAL | Status: DC | PRN

## 2024-03-30 MED ORDER — ONDANSETRON HCL 4 MG/2ML IJ SOLN: 4.0000 mg | Freq: Four times a day (QID) | INTRAMUSCULAR | Status: DC | PRN

## 2024-03-30 MED ORDER — ASPIRIN 81 MG PO TBEC
81.0000 mg | DELAYED_RELEASE_TABLET | Freq: Every day | ORAL | Status: DC
  Administered 2024-03-30 – 2024-04-04 (×6): 81 mg via ORAL
  Filled 2024-03-30 (×7): qty 1

## 2024-03-30 MED ORDER — PREDNISONE 20 MG PO TABS
40.0000 mg | ORAL_TABLET | Freq: Every day | ORAL | Status: AC
  Administered 2024-03-31 – 2024-04-03 (×4): 40 mg via ORAL
  Filled 2024-03-30 (×4): qty 2

## 2024-03-30 MED ORDER — SODIUM CHLORIDE 0.9 % IV SOLN
500.0000 mg | Freq: Once | INTRAVENOUS | Status: AC
  Administered 2024-03-30: 500 mg via INTRAVENOUS
  Filled 2024-03-30: qty 5

## 2024-03-30 MED ORDER — TRAMADOL HCL 50 MG PO TABS
50.0000 mg | ORAL_TABLET | Freq: Two times a day (BID) | ORAL | Status: DC | PRN
  Administered 2024-04-03: 50 mg via ORAL
  Filled 2024-03-30: qty 1

## 2024-03-30 MED ORDER — CYCLOBENZAPRINE HCL 10 MG PO TABS: 5.0000 mg | ORAL_TABLET | Freq: Three times a day (TID) | ORAL | Status: DC | PRN

## 2024-03-30 MED ORDER — ENOXAPARIN SODIUM 40 MG/0.4ML IJ SOSY
40.0000 mg | PREFILLED_SYRINGE | INTRAMUSCULAR | Status: DC
  Administered 2024-03-30 – 2024-04-03 (×5): 40 mg via SUBCUTANEOUS
  Filled 2024-03-30 (×5): qty 0.4

## 2024-03-30 MED ORDER — SODIUM CHLORIDE 0.9 % IV SOLN: 250.0000 mL | INTRAVENOUS | Status: AC | PRN

## 2024-03-30 MED ORDER — IPRATROPIUM-ALBUTEROL 0.5-2.5 (3) MG/3ML IN SOLN
9.0000 mL | Freq: Once | RESPIRATORY_TRACT | Status: AC
  Administered 2024-03-30: 9 mL via RESPIRATORY_TRACT
  Filled 2024-03-30: qty 9

## 2024-03-30 MED ORDER — IOHEXOL 350 MG/ML SOLN
100.0000 mL | Freq: Once | INTRAVENOUS | Status: AC | PRN
  Administered 2024-03-30: 100 mL via INTRAVENOUS

## 2024-03-30 MED ORDER — BUDESON-GLYCOPYRROL-FORMOTEROL 160-9-4.8 MCG/ACT IN AERO
2.0000 | INHALATION_SPRAY | Freq: Two times a day (BID) | RESPIRATORY_TRACT | Status: DC
  Administered 2024-03-30 – 2024-04-04 (×9): 2 via RESPIRATORY_TRACT
  Filled 2024-03-30: qty 5.9

## 2024-03-30 MED ORDER — ATORVASTATIN CALCIUM 20 MG PO TABS
40.0000 mg | ORAL_TABLET | Freq: Every day | ORAL | Status: DC
  Administered 2024-03-30 – 2024-04-04 (×6): 40 mg via ORAL
  Filled 2024-03-30 (×7): qty 2

## 2024-03-30 MED ORDER — IPRATROPIUM-ALBUTEROL 0.5-2.5 (3) MG/3ML IN SOLN
3.0000 mL | Freq: Four times a day (QID) | RESPIRATORY_TRACT | Status: DC
  Administered 2024-03-30 – 2024-03-31 (×7): 3 mL via RESPIRATORY_TRACT
  Filled 2024-03-30 (×7): qty 3

## 2024-03-30 MED ORDER — AZELASTINE HCL 0.1 % NA SOLN
1.0000 | Freq: Two times a day (BID) | NASAL | Status: DC
  Administered 2024-03-30 – 2024-04-04 (×10): 1 via NASAL
  Filled 2024-03-30 (×2): qty 30

## 2024-03-30 MED ORDER — SODIUM CHLORIDE 0.9 % IV SOLN
2.0000 g | INTRAVENOUS | Status: DC
  Administered 2024-03-30 – 2024-04-04 (×6): 2 g via INTRAVENOUS
  Filled 2024-03-30 (×6): qty 20

## 2024-03-30 MED ORDER — FUROSEMIDE 10 MG/ML IJ SOLN
40.0000 mg | Freq: Two times a day (BID) | INTRAMUSCULAR | Status: DC
  Administered 2024-03-30 – 2024-04-01 (×5): 40 mg via INTRAVENOUS
  Filled 2024-03-30 (×5): qty 4

## 2024-03-30 MED ORDER — MORPHINE SULFATE (PF) 4 MG/ML IV SOLN
4.0000 mg | Freq: Once | INTRAVENOUS | Status: AC
  Administered 2024-03-30: 4 mg via INTRAVENOUS
  Filled 2024-03-30: qty 1

## 2024-03-30 MED ORDER — SODIUM CHLORIDE 0.9 % IV SOLN
2.0000 g | Freq: Once | INTRAVENOUS | Status: AC
  Administered 2024-03-30: 2 g via INTRAVENOUS
  Filled 2024-03-30: qty 12.5

## 2024-03-30 MED ORDER — FINASTERIDE 5 MG PO TABS
5.0000 mg | ORAL_TABLET | Freq: Every day | ORAL | Status: DC
  Administered 2024-03-31 – 2024-04-04 (×5): 5 mg via ORAL
  Filled 2024-03-30 (×5): qty 1

## 2024-03-30 MED ORDER — METHYLPREDNISOLONE SODIUM SUCC 40 MG IJ SOLR
40.0000 mg | Freq: Two times a day (BID) | INTRAMUSCULAR | Status: AC
  Administered 2024-03-30 (×2): 40 mg via INTRAVENOUS
  Filled 2024-03-30 (×2): qty 1

## 2024-03-30 MED ORDER — SODIUM CHLORIDE 0.9 % IV SOLN
500.0000 mg | INTRAVENOUS | Status: DC
  Administered 2024-03-31 – 2024-04-01 (×2): 500 mg via INTRAVENOUS
  Filled 2024-03-30 (×2): qty 5

## 2024-03-30 MED ORDER — ALBUTEROL SULFATE (2.5 MG/3ML) 0.083% IN NEBU: 2.5000 mg | INHALATION_SOLUTION | Freq: Four times a day (QID) | RESPIRATORY_TRACT | Status: DC | PRN

## 2024-03-30 MED ORDER — SODIUM CHLORIDE 0.9% FLUSH
3.0000 mL | Freq: Two times a day (BID) | INTRAVENOUS | Status: DC
  Administered 2024-03-30 – 2024-04-04 (×10): 3 mL via INTRAVENOUS

## 2024-03-30 MED ORDER — ACETAMINOPHEN 325 MG PO TABS
650.0000 mg | ORAL_TABLET | ORAL | Status: DC | PRN
  Administered 2024-04-02 – 2024-04-03 (×2): 650 mg via ORAL
  Filled 2024-03-30 (×2): qty 2

## 2024-03-30 MED ORDER — NICOTINE 21 MG/24HR TD PT24
21.0000 mg | MEDICATED_PATCH | Freq: Every day | TRANSDERMAL | Status: DC
  Administered 2024-03-30 – 2024-04-04 (×6): 21 mg via TRANSDERMAL
  Filled 2024-03-30 (×6): qty 1

## 2024-03-30 MED ORDER — METHYLPREDNISOLONE SODIUM SUCC 125 MG IJ SOLR
125.0000 mg | Freq: Once | INTRAMUSCULAR | Status: AC
  Administered 2024-03-30: 125 mg via INTRAVENOUS
  Filled 2024-03-30: qty 2

## 2024-03-30 MED ORDER — LORAZEPAM 0.5 MG PO TABS
0.5000 mg | ORAL_TABLET | Freq: Every day | ORAL | Status: DC | PRN
  Administered 2024-04-02 – 2024-04-03 (×2): 0.5 mg via ORAL
  Filled 2024-03-30 (×2): qty 1

## 2024-03-30 MED ORDER — SODIUM CHLORIDE 0.9% FLUSH: 3.0000 mL | INTRAVENOUS | Status: DC | PRN

## 2024-03-30 MED ORDER — TAMSULOSIN HCL 0.4 MG PO CAPS
0.4000 mg | ORAL_CAPSULE | Freq: Every day | ORAL | Status: DC
  Administered 2024-03-30 – 2024-04-04 (×6): 0.4 mg via ORAL
  Filled 2024-03-30 (×6): qty 1

## 2024-03-30 NOTE — Evaluation (Signed)
 Physical Therapy Evaluation Patient Details Name: Colton Nguyen MRN: [id number] DOB: Sep 12, 1945 Today's Date: 03/30/2024  History of Present Illness  78 y.o. male past medical history significant for COPD on a baseline home 2 L of oxygen, CAD, thyroid disorder, hypertension, hyperlipidemia presents to the emergency department after multiple falls.  Patient had multiple episodes of passing out over the past couple of days according to his sister.  She states that he has been more confused and having memory issues that has been worsening over the past week.  Clinical Impression  Patient sleeping in bed upon arrival to room; easily awakens to voice and light touch.  Oriented to self only; believes self to be in rehab facility, here for therapy.  Pleasant and cooperative, but globally confused, throughout session.  Bilat UE/LE strength and ROM grossly symmetrical and WFL for basic transfers and mobility; no focal weakness or asymmetry noted. Does demonstrate occasional myoclonic jerking (UEs > LEs), appears to diminish with increased alertness and purposeful movement (question related to medications?). Currently requiring min/mod assist for bed mobility; min/mod assist for sit/stand, standing balance and bed/chair transfer with L HHA.  Demonstrates choppy stepping pattern with poor dynamic balance, frequent posterior lean requiring constant physical assist from therapist to correct/prevent LOB Vitals stable and WFL throughout session; sats >90% on 2L supplemental O2 via [place] at rest and with exertion. Would benefit from skilled PT to address above deficits and promote optimal return to PLOF.; recommend post-acute PT follow up as indicated by interdisciplinary care team.          If plan is discharge home, recommend the following: A lot of help with walking and/or transfers;A lot of help with bathing/dressing/bathroom   Can travel by private vehicle   Yes    Equipment Recommendations     Recommendations for Other Services       Functional Status Assessment Patient has had a recent decline in their functional status and demonstrates the ability to make significant improvements in function in a reasonable and predictable amount of time.     Precautions / Restrictions Precautions Precautions: Fall Restrictions Weight Bearing Restrictions Per Provider Order: No      Mobility  Bed Mobility Overal bed mobility: Needs Assistance Bed Mobility: Supine to Sit     Supine to sit: Contact guard          Transfers Overall transfer level: Needs assistance Equipment used: 1 person hand held assist Transfers: Sit to/from Stand, Bed to chair/wheelchair/BSC Sit to Stand: Mod assist, Min assist Stand pivot transfers: Min assist, Mod assist         General transfer comment: sit/stand and SPT wiht L HHA, min/mod assist; choppy stepping pattern with poor dynamic balance, frequent posterior lean requiring constant physical assist from therapist to correct/prevent LOB    Ambulation/Gait               General Gait Details: deferred this session  Stairs            Wheelchair Mobility     Tilt Bed    Modified Rankin (Stroke Patients Only)       Balance Overall balance assessment: Needs assistance Sitting-balance support: No upper extremity supported, Feet supported Sitting balance-Leahy Scale: Good     Standing balance support: During functional activity, Single extremity supported Standing balance-Leahy Scale: Poor  Pertinent Vitals/Pain Pain Assessment Pain Assessment: No/denies pain    Home Living Family/patient expects to be discharged to:: Private residence Living Arrangements: Children;Other (Comment) (lives with adult son with autism) Available Help at Discharge: Family;Available PRN/intermittently Type of Home: House Home Access: Stairs to enter Entrance Stairs-Rails: Conservation officer, historic buildings of Steps: 5-6   Home Layout: One level Home Equipment: Cane - single Librarian, academic (2 wheels) Additional Comments: Pt on 2Ls via [place] at home    Prior Function Prior Level of Function : Independent/Modified Independent;Needs assist             Mobility Comments: Mod indep with SPC vs RW for household mobilization; home O2 at 2L ADLs Comments: Pt reports being Mod I with RW but recently having difficulty caring for himself. Son lives with him but he has autism and is unable to assist.     Extremity/Trunk Assessment   Upper Extremity Assessment Upper Extremity Assessment: Generalized weakness (grossly 4-/5 throughout)    Lower Extremity Assessment Lower Extremity Assessment: Generalized weakness (grossly 4-/5 throughout)       Communication   Communication Communication: No apparent difficulties    Cognition Arousal: Lethargic Behavior During Therapy: WFL for tasks assessed/performed, Impulsive                           PT - Cognition Comments: Oriented to self only; believed self to be in rehab facility, here for therapy.  Does follow simple commands, but limited carry-over of new information.  Limited insight into deficits and overall safety needs. Following commands: Intact       Cueing Cueing Techniques: Verbal cues     General Comments      Exercises Other Exercises Other Exercises: Patient noted with bladder incontinence prior to OOB activities; max assist for hygiene, undergarment changing.  Patient generally unaware of incontinence.   Assessment/Plan    PT Assessment Patient needs continued PT services  PT Problem List Decreased strength;Decreased activity tolerance;Decreased balance;Decreased mobility;Decreased coordination;Decreased cognition;Decreased knowledge of use of DME;Decreased safety awareness;Decreased knowledge of precautions;Cardiopulmonary status limiting activity       PT Treatment Interventions DME  instruction;Gait training;Functional mobility training;Therapeutic activities;Balance training;Therapeutic exercise;Cognitive remediation;Patient/family education    PT Goals (Current goals can be found in the Care Plan section)  Acute Rehab PT Goals Patient Stated Goal: agreeable for OOB to chair PT Goal Formulation: With patient Time For Goal Achievement: 04/13/24 Potential to Achieve Goals: Good    Frequency Min 2X/week     Co-evaluation               AM-PAC PT 6 Clicks Mobility  Outcome Measure Help needed turning from your back to your side while in a flat bed without using bedrails?: None Help needed moving from lying on your back to sitting on the side of a flat bed without using bedrails?: A Little Help needed moving to and from a bed to a chair (including a wheelchair)?: A Lot Help needed standing up from a chair using your arms (e.g., wheelchair or bedside chair)?: A Lot Help needed to walk in hospital room?: A Lot Help needed climbing 3-5 steps with a railing? : A Lot 6 Click Score: 15    End of Session   Activity Tolerance: Patient tolerated treatment well;Patient limited by lethargy Patient left: with call bell/phone within reach;with chair alarm set;in chair;with family/visitor present Nurse Communication: Mobility status PT Visit Diagnosis: Muscle weakness (generalized) (M62.81);Difficulty in walking, not elsewhere  classified (R26.2)    Time: 1204-1224 PT Time Calculation (min) (ACUTE ONLY): 20 min   Charges:   PT Evaluation $PT Eval Moderate Complexity: 1 Mod   PT General Charges $$ ACUTE PT VISIT: 1 Visit         Raylee Strehl H. Delores, PT, DPT, NCS 03/30/24, 1:22 PM [phone]

## 2024-03-30 NOTE — Evaluation (Signed)
 Clinical/Bedside Swallow Evaluation Patient Details  Name: Akshaj Besancon MRN: [id number] Date of Birth: 1946-03-03  Today's Date: 03/30/2024 Time: SLP Start Time (ACUTE ONLY): 9073 SLP Stop Time (ACUTE ONLY): 0945 SLP Time Calculation (min) (ACUTE ONLY): 19 min  Past Medical History:  Past Medical History:  Diagnosis Date   Acute respiratory failure with hypoxia (HCC) 01/28/2023   Anxiety    situational   Arthritis    BPH (benign prostatic hyperplasia)    CAD (coronary artery disease)    CHF (congestive heart failure) (HCC)    pt denies 11/13/2019   Chronic cardiopulmonary disease (HCC)    COPD (chronic obstructive pulmonary disease) (HCC)    History of kidney stones    Hyperlipidemia    Hypertension    Hypertension 12/28/2016   lung ca dx'd 03/2015   Mass of lung    Nicotine  dependence    Personal history of kidney stones    Restless leg    Shortness of breath dyspnea    with exertion   Tuberculosis    Wears glasses    Past Surgical History:  Past Surgical History:  Procedure Laterality Date   BACK SURGERY     BIOPSY  01/25/2020   Procedure: BIOPSY;  Surgeon: Legrand Victory LITTIE DOUGLAS, MD;  Location: [facility];  Service: Gastroenterology;;   CAD CCTA 01/31/15     CERVICAL FUSION     COLONOSCOPY     COLONOSCOPY WITH PROPOFOL  N/A 01/25/2020   Procedure: COLONOSCOPY WITH PROPOFOL ;  Surgeon: Legrand Victory LITTIE DOUGLAS, MD;  Location: [facility];  Service: Gastroenterology;  Laterality: N/A;   ESOPHAGOGASTRODUODENOSCOPY (EGD) WITH PROPOFOL  N/A 01/25/2020   Procedure: ESOPHAGOGASTRODUODENOSCOPY (EGD) WITH PROPOFOL ;  Surgeon: Legrand Victory LITTIE DOUGLAS, MD;  Location: [facility];  Service: Gastroenterology;  Laterality: N/A;   FRACTURE SURGERY     left ankle   HEMOSTASIS CLIP PLACEMENT  01/25/2020   Procedure: HEMOSTASIS CLIP PLACEMENT;  Surgeon: Legrand Victory LITTIE DOUGLAS, MD;  Location: [facility];  Service: Gastroenterology;;   HOT HEMOSTASIS N/A 01/25/2020   Procedure: HOT HEMOSTASIS (ARGON  PLASMA COAGULATION/BICAP);  Surgeon: Legrand Victory LITTIE DOUGLAS, MD;  Location: [facility];  Service: Gastroenterology;  Laterality: N/A;   LUNG BIOPSY N/A 02/27/2015   Procedure: LUNG BIOPSY;  Surgeon: Dallas KATHEE Jude, MD;  Location: [facility];  Service: Thoracic;  Laterality: N/A;   right inguinal hernia repair at age 35     VIDEO BRONCHOSCOPY WITH ENDOBRONCHIAL ULTRASOUND N/A 02/27/2015   Procedure: VIDEO BRONCHOSCOPY WITH ENDOBRONCHIAL ULTRASOUND;  Surgeon: Dallas KATHEE Jude, MD;  Location: [facility];  Service: Thoracic;  Laterality: N/A;   HPI:  Fountain Derusha is a 78 y.o. 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 Chest x-ray reveals 1. No evidence of acute chest disease.  2. Emphysema.  3. Post treatment changes, fibrotic consolidation and volume loss in  the right upper lobe.  4. Aortic atherosclerosis.    Assessment / Plan / Recommendation  Clinical Impression  Pt presents with adequate oropharyngeal abilities when consuming his regular breakfast tray (regular and puree items available) as well as thin liquids via cup. Despite missing dentition, pt demonstrated good mastication and oral phase abilities. He also had the appearance of timely pharyngeal swallow  with no overt s/s of aspiration. Pt had 2 family members present throughout evaluation. They don't report any overt s/s of aspiration during PO consumption at baseline. At this time, current diet appears appropriate and pt's risk of aspiration appears reduced when following general aspiration precautions. Education provided. No further skilled ST services are indicated. SLP Visit Diagnosis: Dysphagia, unspecified  (R13.10)    Aspiration Risk  No limitations;Mild aspiration risk    Diet Recommendation Regular;Thin liquid    Liquid Administration via: Cup;Straw Medication Administration: Whole meds with liquid Supervision: Patient able to self feed Compensations: Minimize environmental distractions;Slow rate;Small sips/bites Postural Changes: Seated upright at 90 degrees;Remain upright for at least 30 minutes after po intake    Other  Recommendations Oral Care Recommendations: Oral care BID     Assistance Recommended at Discharge  N/A  Functional Status Assessment Patient has not had a recent decline in their functional status  Frequency and Duration   N/A         Prognosis   N/A     Swallow Study   General Date of Onset: 03/30/24 HPI: Jairo Bellew is a 78 y.o. 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 Chest x-ray reveals 1. No evidence of acute chest disease.  2. Emphysema.  3. Post treatment changes, fibrotic consolidation and volume loss in  the right upper lobe.  4. Aortic atherosclerosis. Type of Study: Bedside Swallow Evaluation Previous Swallow Assessment: none in chart Diet Prior to this Study: Regular;Thin liquids (Level 0) Temperature Spikes Noted: No Respiratory Status: Nasal cannula History of Recent Intubation: No Behavior/Cognition: Alert;Cooperative;Pleasant mood Oral Cavity Assessment: Within Functional Limits Oral Care Completed by SLP: No Oral Cavity - Dentition: Poor condition;Missing dentition Vision: Functional for self-feeding Self-Feeding Abilities: Able to feed self Patient Positioning: Upright in bed Baseline Vocal Quality:  Normal Volitional Cough: Strong Volitional Swallow: Able to elicit    Oral/Motor/Sensory Function Overall Oral Motor/Sensory Function: Within functional limits   Ice Chips Ice chips: Not tested   Thin Liquid Thin Liquid: Within functional limits Presentation: Self Fed;Cup    Nectar Thick Nectar Thick Liquid: Not tested   Honey Thick Honey Thick Liquid: Not tested   Puree Puree: Within functional limits Presentation: Self Fed;Spoon   Solid     Solid: Within functional limits Presentation: Self Fed     Caren Garske B. Rubbie, M.S., CCC-SLP, Tree surgeon Certified Brain Injury Specialist [facility]  [facility] Office [phone] Ascom [phone] Fax [phone]

## 2024-03-30 NOTE — Consult Note (Signed)
 ED Pharmacy Antibiotic Sign Off An antibiotic consult was received from an ED provider for Cefepime  per pharmacy dosing for CAP/COPD. A chart review was completed to assess appropriateness.   The following one time order(s) were placed:  Cefepime  2g IV.  Further antibiotic and/or antibiotic pharmacy consults should be ordered by the admitting provider if indicated.   Thank you for allowing pharmacy to be a part of this patient's care.   Elajah Kunsman A Draya Felker, [facility]  Clinical Pharmacist 03/30/24 6:16 AM

## 2024-03-30 NOTE — Assessment & Plan Note (Signed)
 Patient is seen by oncology, who manage this condition.  He is well controlled with current therapy.   Will defer to them for further changes to plan of care.

## 2024-03-30 NOTE — Assessment & Plan Note (Signed)
 A1C Continues to be in prediabetic ranges.  Will reassess at follow up after next lab check.  Patient counseled on dietary choices and verbalized understanding.   -CBC w/Diff -CMP w/eGFR -Hemoglobin A1C

## 2024-03-30 NOTE — ED Provider Notes (Signed)
 [facility] Provider Note    Event Date/Time   First MD Initiated Contact with Patient 03/30/24 0140     (approximate)   History   Fall   HPI  Colton Nguyen is a 78 y.o. male past medical history significant for COPD on a baseline home 2 L of oxygen, CAD, thyroid disorder, hypertension, hyperlipidemia presents to the emergency department after multiple falls.  Patient had multiple episodes of passing out over the past couple of days according to his sister.  She states that he has been more confused and having memory issues that has been worsening over the past week.  Denies any chest pain and patient does not have any complaints.  Denies abdominal pain, nausea vomiting or diarrhea.  No fever or chills.  Denies history of DVT or PE.  States that he has not been eating or drinking much over the past week.  New swelling to his legs that is worse on the left side.       Physical Exam   Triage Vital Signs: ED Triage Vitals  Encounter Vitals Group     BP      Girls Systolic BP Percentile      Girls Diastolic BP Percentile      Boys Systolic BP Percentile      Boys Diastolic BP Percentile      Pulse      Resp      Temp      Temp src      SpO2      Weight      Height      Head Circumference      Peak Flow      Pain Score      Pain Loc      Pain Education      Exclude from Growth Chart     Most recent vital signs: Vitals:   03/30/24 0703 03/30/24 0737  BP:    Pulse:  84  Resp:  19  Temp: 98 F (36.7 C)   SpO2:  95%    Physical Exam Constitutional:      Appearance: He is well-developed.  HENT:     Head: Atraumatic.  Eyes:     Conjunctiva/sclera: Conjunctivae normal.  Cardiovascular:     Rate and Rhythm: Regular rhythm.  Pulmonary:     Effort: Respiratory distress present.     Breath sounds: Wheezing present.  Abdominal:     Tenderness: There is no abdominal tenderness.  Musculoskeletal:     Cervical back: Normal range of  motion.  Skin:    General: Skin is warm.  Neurological:     Mental Status: He is alert. He is disoriented.      IMPRESSION / MDM / ASSESSMENT AND PLAN / ED COURSE  I reviewed the triage vital signs and the nursing notes.  Differential diagnosis including new onset heart failure, COPD exacerbation, ACS, pneumonia, intracranial hemorrhage, pulmonary embolism, pneumothorax  EKG  I, Clotilda Punter, the attending physician, personally viewed and interpreted this ECG.  EKG with significant artifact.  Nonspecific changes.  No significant ST elevation or depression.  No findings of acute ischemia.  No tachycardic or bradycardic dysrhythmias while on cardiac telemetry.  RADIOLOGY I independently reviewed imaging, my interpretation of imaging: Chest x-ray no signs of pneumothorax.  CTA with no obvious pulmonary embolism, concern for pneumonia.  No DVT.  CTA was read as no pulmonary embolism but concern for aspiration or pneumonia.  LABS (  all labs ordered are listed, but only abnormal results are displayed) Labs interpreted as -    Labs Reviewed  COMPREHENSIVE METABOLIC PANEL WITH GFR - Abnormal; Notable for the following components:      Result Value   Chloride 93 (*)    CO2 34 (*)    Calcium  8.5 (*)    Total Protein 6.1 (*)    Albumin  3.0 (*)    All other components within normal limits  BLOOD GAS, VENOUS - Abnormal; Notable for the following components:   pCO2, Ven 94 (*)    pO2, Ven <31 (*)    Bicarbonate 48.4 (*)    Acid-Base Excess 17.3 (*)    All other components within normal limits  D-DIMER, QUANTITATIVE - Abnormal; Notable for the following components:   D-Dimer, Quant 1.43 (*)    All other components within normal limits  CBC WITH DIFFERENTIAL/PLATELET - Abnormal; Notable for the following components:   Hemoglobin 10.4 (*)    HCT 34.7 (*)    MCH 24.0 (*)    RDW 18.1 (*)    All other components within normal limits  CULTURE, BLOOD (ROUTINE X 2)  CULTURE, BLOOD  (ROUTINE X 2)  LACTIC ACID, PLASMA  BRAIN NATRIURETIC PEPTIDE  TSH  PROTIME-INR  CBC WITH DIFFERENTIAL/PLATELET  URINALYSIS, W/ REFLEX TO CULTURE (INFECTION SUSPECTED)  BLOOD GAS, VENOUS  TROPONIN I (HIGH SENSITIVITY)     MDM  Patient with significant confusion, refusing CT scan, refusing BiPAP.  Called his sister who was able to talk him into it.  Moderate risk Wells criteria, D-dimer was positive, lower extremity DVT was negative.  CTA with no signs of pulmonary embolism.  Clinical picture is most concerning for COPD exacerbation.  Given DuoNeb treatment and IV Solu-Medrol .  Found to have findings concerning for an aspiration pneumonia, blood cultures obtained.  No significant leukocytosis and lactic acid within normal limits.  Started on antibiotics with Pseudomonas coverage given his significant COPD with cefepime  and azithromycin .  Significant hypercarbia with a CO2 of 94.  Placed on BiPAP.  Patient mated to the hospitalist for acute on chronic hypercarbic respiratory failure with COPD exacerbation and aspiration pneumonia     PROCEDURES:  Critical Care performed: yes  .Critical Care  Performed by: Suzanne Kirsch, MD Authorized by: Suzanne Kirsch, MD   Critical care provider statement:    Critical care time (minutes):  45   Critical care time was exclusive of:  Separately billable procedures and treating other patients   Critical care was necessary to treat or prevent imminent or life-threatening deterioration of the following conditions:  Respiratory failure   Critical care was time spent personally by me on the following activities:  Development of treatment plan with patient or surrogate, discussions with consultants, evaluation of patient's response to treatment, examination of patient, ordering and review of laboratory studies, ordering and review of radiographic studies, ordering and performing treatments and interventions, pulse oximetry, re-evaluation of patient's  condition and review of old charts   Care discussed with: admitting provider     Patient's presentation is most consistent with acute presentation with potential threat to life or bodily function.   MEDICATIONS ORDERED IN ED: Medications  azithromycin  (ZITHROMAX ) 500 mg in sodium chloride  0.9 % 250 mL IVPB (has no administration in time range)  iohexol  (OMNIPAQUE ) 350 MG/ML injection 100 mL (100 mLs Intravenous Contrast Given 03/30/24 0448)  ipratropium-albuterol  (DUONEB) 0.5-2.5 (3) MG/3ML nebulizer solution 9 mL (9 mLs Nebulization Given 03/30/24 0457)  methylPREDNISolone  sodium  succinate (SOLU-MEDROL ) 125 mg/2 mL injection 125 mg (125 mg Intravenous Given 03/30/24 0500)  ceFEPIme  (MAXIPIME ) 2 g in sodium chloride  0.9 % 100 mL IVPB (0 g Intravenous Stopped 03/30/24 0752)  morphine  (PF) 4 MG/ML injection 4 mg (4 mg Intravenous Given 03/30/24 0753)    FINAL CLINICAL IMPRESSION(S) / ED DIAGNOSES   Final diagnoses:  COPD with acute exacerbation (HCC)  Pneumonia due to infectious organism, unspecified laterality, unspecified part of lung     Rx / DC Orders   ED Discharge Orders     None        Note:  This document was prepared using Dragon voice recognition software and may include unintentional dictation errors.   Suzanne Kirsch, MD 03/30/24 [phone]

## 2024-03-30 NOTE — ED Notes (Signed)
 PT placed in cardiac monitoring with CCMD.

## 2024-03-30 NOTE — ED Notes (Signed)
 Pt is sitting in chair by bedside, pt stated he wanted to get up out of the chair, informed pt we could adjust pt chair to make him more comfortable. Pt is comfortable in chair with a warm blanket at this time. Pt asked to speak with nurse at this time, informed pt that the nurse would be informed to come in to speak with pt at this time. Call bell is by bedside at this time.

## 2024-03-30 NOTE — ED Notes (Signed)
 Pt states he feels like his sister is here. RN to lobby to look for sister. Sister not in lobby, pt made aware.

## 2024-03-30 NOTE — ED Notes (Signed)
 Pt refusing BIPAP until speaking with sister. Sister contacted on phone, pt agreeable to BiPAP after speaking with sister.

## 2024-03-30 NOTE — Evaluation (Signed)
 Occupational Therapy Evaluation Patient Details Name: Colton Nguyen MRN: [id number] DOB: 01-13-1946 Today's Date: 03/30/2024   History of Present Illness   78 y.o. male past medical history significant for COPD on a baseline home 2 L of oxygen, CAD, thyroid disorder, hypertension, hyperlipidemia presents to the emergency department after multiple falls.  Patient had multiple episodes of passing out over the past couple of days according to his sister.  She states that he has been more confused and having memory issues that has been worsening over the past week.     Clinical Impressions Patient presenting with decreased Ind in self care,balance, functional mobility/transfers, endurance, and safety awareness. Patient in room with sister and sister in law. Pt lives at home with adult son. Family reports his son has autism and is unable to assist pt. Pt has been Ind with use of RW but has been having difficulty caring for himself at home recently. He is on 2Ls via [place] at baseline. He has been only ambulating short distances at home recently. Pt needing min A for bed mobility and noted to have been incontinent of urine with linens and clothing soaked. Pt stands with min A and ambulates with use of IV pole (pushing like a cart). Pt needing max A for clothing management and hygiene from commode. He returns back to bed at end of session and appears very fatigued with eyes already closed. Pt needing assistance with all aspects of care at this time with decreased endurance.  Patient will benefit from acute OT to increase overall independence in the areas of ADLs, functional mobility, and safety awareness in order to safely discharge .     If plan is discharge home, recommend the following:   A little help with walking and/or transfers;A lot of help with bathing/dressing/bathroom;Assistance with cooking/housework;Assist for transportation;Help with stairs or ramp for entrance     Functional Status  Assessment   Patient has had a recent decline in their functional status and demonstrates the ability to make significant improvements in function in a reasonable and predictable amount of time.     Equipment Recommendations   Other (comment) (defer to next venue of care)      Precautions/Restrictions   Precautions Precautions: Fall     Mobility Bed Mobility Overal bed mobility: Needs Assistance Bed Mobility: Supine to Sit, Sit to Supine     Supine to sit: Min assist Sit to supine: Min assist        Transfers Overall transfer level: Needs assistance Equipment used: 1 person hand held assist Transfers: Sit to/from Stand Sit to Stand: Min assist                  Balance Overall balance assessment: Needs assistance Sitting-balance support: Feet supported Sitting balance-Leahy Scale: Good     Standing balance support: Bilateral upper extremity supported Standing balance-Leahy Scale: Fair                             ADL either performed or assessed with clinical judgement   ADL Overall ADL's : Needs assistance/impaired                         Toilet Transfer: Minimal assistance;Grab bars   Toileting- Clothing Manipulation and Hygiene: Maximal assistance Toileting - Clothing Manipulation Details (indicate cue type and reason): assistance for clothing management and hygiene  Vision Baseline Vision/History: 1 Wears glasses Patient Visual Report: No change from baseline              Pertinent Vitals/Pain Pain Assessment Pain Assessment: No/denies pain     Extremity/Trunk Assessment Upper Extremity Assessment Upper Extremity Assessment: Generalized weakness   Lower Extremity Assessment Lower Extremity Assessment: Generalized weakness       Communication Communication Communication: No apparent difficulties   Cognition Arousal: Alert Behavior During Therapy: WFL for tasks assessed/performed Cognition:  No apparent impairments                               Following commands: Intact       Cueing  General Comments   Cueing Techniques: Verbal cues              Home Living Family/patient expects to be discharged to:: Private residence Living Arrangements: Children;Other (Comment) (Pt lives with adult son with autism) Available Help at Discharge: Family;Available PRN/intermittently Type of Home: House Home Access: Stairs to enter [employer] of Steps: 5-6 Entrance Stairs-Rails: Right;Left Home Layout: One level     Bathroom Shower/Tub: Estate manager/land agent Accessibility: Yes How Accessible: Accessible via walker Home Equipment: Cane - single point;Rolling Walker (2 wheels)   Additional Comments: Pt on 2Ls via [place] at home      Prior Functioning/Environment Prior Level of Function : Independent/Modified Independent;Needs assist               ADLs Comments: Pt reports being Mod I with RW but recently having difficulty caring for himself. Son lives with him but he has autism and is unable to assist.    OT Problem List: Decreased strength;Impaired balance (sitting and/or standing);Pain;Decreased activity tolerance;Decreased knowledge of use of DME or AE;Cardiopulmonary status limiting activity;Decreased safety awareness   OT Treatment/Interventions: Self-care/ADL training;Therapeutic exercise;Energy conservation;Patient/family education;Balance training;Therapeutic activities;DME and/or AE instruction      OT Goals(Current goals can be found in the care plan section)   Acute Rehab OT Goals Patient Stated Goal: to get stronger OT Goal Formulation: With patient/family Time For Goal Achievement: 04/13/24 Potential to Achieve Goals: Fair ADL Goals Pt Will Perform Grooming: with supervision;standing Pt Will Perform Lower Body Dressing: with supervision;sit to/from stand Pt Will Transfer to Toilet: with supervision;ambulating Pt Will  Perform Toileting - Clothing Manipulation and hygiene: with supervision;sit to/from stand   OT Frequency:  Min 2X/week       AM-PAC OT 6 Clicks Daily Activity     Outcome Measure Help from another person eating meals?: None Help from another person taking care of personal grooming?: None Help from another person toileting, which includes using toliet, bedpan, or urinal?: A Lot Help from another person bathing (including washing, rinsing, drying)?: A Lot Help from another person to put on and taking off regular upper body clothing?: A Little Help from another person to put on and taking off regular lower body clothing?: A Lot 6 Click Score: 17   End of Session Nurse Communication: Mobility status  Activity Tolerance: Patient limited by fatigue Patient left: in bed;with call bell/phone within reach;with family/visitor present  OT Visit Diagnosis: Unsteadiness on feet (R26.81);Muscle weakness (generalized) (M62.81)                Time: 9053-8985 OT Time Calculation (min): 28 min Charges:  OT General Charges $OT Visit: 1 Visit OT Evaluation $OT Eval Moderate Complexity: 1 Mod OT Treatments $Self  Care/Home Management : 8-22 mins  Izetta Claude, MS, OTR/L , CBIS ascom [phone]  03/30/24, 10:54 AM

## 2024-03-30 NOTE — Assessment & Plan Note (Signed)
 Will send order for [facility] for patient.  Also getting him Wheelchair and Lift Chair to help with mobility.

## 2024-03-30 NOTE — Progress Notes (Deleted)
 PT Cancellation Note  Patient Details Name: Colton Nguyen MRN: [id number] DOB: 12-09-1945   Cancelled Treatment:    Reason Eval/Treat Not Completed: Patient at procedure or test/unavailable (Consult received and chart reviewed. Patient off unit for imaging; will re-attempt at later time/date as medically appropriate and available.)   Luan Maberry H. Delores, PT, DPT, NCS 03/30/24, 11:31 AM [phone]

## 2024-03-30 NOTE — ED Notes (Signed)
 Physical therapy at bedside

## 2024-03-30 NOTE — ED Notes (Signed)
 Pt back to room from US . MD made aware pt refusing CT scan until speaking with MD first. Pt agreeable to CT scan. Pt transported to CT via stretcher.

## 2024-03-30 NOTE — TOC CM/SW Note (Signed)
..  Transition of Care [facility]) - Inpatient Brief Assessment   Patient Details  Name: Colton Nguyen MRN: [id number] Date of Birth: 12/20/1945  Transition of Care [facility]) CM/SW Contact:    Edsel DELENA Fischer, LCSW Phone Number: 03/30/2024, 6:25 PM   Clinical Narrative:  SW met with pt at bedside. Per pt report: Pt lives with son .  Son has autism.  No Safety/ DV concerns.  PCP: Dr. Orlean. Pharmacy: [facility].  HH:  Pt does not remember what HH provider used in the past. DME: walker.  Transportation: Pt stated that he drove himself to the hospital.  Social Support: sister.  No financial concerns report or issues paying for medications.  ADL/ IDL concerns: no. Incontinence: pullups.  Medication adherence: yes.  Pt stated that he uses Oxygen and receives supplies from [place]. Dialysis: no.  SW asked pt what [facility] agency he would like to use if order is placed.  Pt stated that he will look into it to find out however if he can't locate [facility], sw can choose [facility] agency for him.   Transition of Care Asessment:

## 2024-03-30 NOTE — ED Triage Notes (Signed)
 BIBA due to multiple falls - at least 3 per EMS.  Reduced PO.  Increased weakness. Aox4  18 L forearm No pain - no complaints BP 116/94 100 at 2L [place] T 98.1 HR 80 CBG 107

## 2024-03-30 NOTE — H&P (Signed)
 History and Physical    Colton Nguyen FMW:[id number] DOB: 26-Nov-1945 DOA: 03/30/2024  PCP: [name], FNP (Confirm with patient/family/NH records and if not entered, this has to be entered at [facility] point of entry) Patient coming from: Home  I have personally briefly reviewed patient's old medical records in [facility]  Chief Complaint: Cough, SOB, lightheaded  HPI: Colton Nguyen is a 78 y.o. 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  Patient is on BiPAP and was only able to provide some of the history, most of history provided by wife at bedside.  Wife reported that patient started to  feel sick about 2 to 3 months ago, initially was decreased appetite, frequent belching, and occasional choking on eating solid food, as result of the patient oral intake had decreased and has lost some weight but she does not know about details.  Lately, patient has been noncompliant with his COPD inhalers and started to have wheezing 4 days ago and gradually getting worse, he also has a dry cough but denies any fever or chills.  Last 2 days patient has been complaining about lightheadedness and frequent falls at home, denied any LOC, no chest pains.  In addition, wife also noticed patient has had swelling of lower extremities this week.  Wife also reported that  not sure if the patient has been taking his BP medications and confirmed that patient continues to smoke cigarettes and the patient's ambulation has been affected recently able to walk a few feet without feeling shortness of breath.  ED Course: Afebrile, nontachycardic blood pressure elevated 160s/80, O2 saturation  99% on 2 L, CTA negative for PE but concerning for right lower lobe pneumonia, DVT study negative.  CT head negative for acute findings.  Blood work showed bicarb 34 BUN 17 creatinine 0.8 VBG 7.32/90/31 improved to 7.4 7/50/31, WBC 7.6 hemoglobin 10.4.  Patient was placed on BiPAP and started on cefepime  and azithromycin .  Review of Systems: Unable to perform, patient is to be in severe respiratory distress and BiPAP mask  Past Medical History:  Diagnosis Date   Acute respiratory failure with hypoxia (HCC) 01/28/2023   Anxiety    situational   Arthritis    BPH (benign prostatic hyperplasia)    CAD (coronary artery disease)    CHF (congestive heart failure) (HCC)    pt denies 11/13/2019   Chronic cardiopulmonary disease (HCC)    COPD (chronic obstructive pulmonary disease) (HCC)    History of kidney stones    Hyperlipidemia    Hypertension    Hypertension 12/28/2016   lung ca dx'd 03/2015   Mass of lung    Nicotine  dependence    Personal history of kidney stones    Restless leg    Shortness of breath dyspnea    with exertion   Tuberculosis    Wears glasses     Past Surgical History:  Procedure Laterality Date   BACK SURGERY     BIOPSY  01/25/2020   Procedure: BIOPSY;  Surgeon: Legrand Victory LITTIE DOUGLAS, MD;  Location: [facility];  Service: Gastroenterology;;   CAD CCTA 01/31/15     CERVICAL FUSION     COLONOSCOPY     COLONOSCOPY WITH PROPOFOL  N/A 01/25/2020  Procedure: COLONOSCOPY WITH PROPOFOL ;  Surgeon: Legrand Victory LITTIE DOUGLAS, MD;  Location: [facility];  Service: Gastroenterology;  Laterality: N/A;   ESOPHAGOGASTRODUODENOSCOPY (EGD) WITH PROPOFOL  N/A 01/25/2020   Procedure: ESOPHAGOGASTRODUODENOSCOPY (EGD) WITH PROPOFOL ;  Surgeon: Legrand Victory LITTIE DOUGLAS, MD;  Location: [facility];  Service: Gastroenterology;  Laterality: N/A;   FRACTURE SURGERY     left ankle   HEMOSTASIS CLIP PLACEMENT  01/25/2020   Procedure: HEMOSTASIS CLIP PLACEMENT;  Surgeon: Legrand Victory LITTIE DOUGLAS, MD;  Location:  [facility];  Service: Gastroenterology;;   HOT HEMOSTASIS N/A 01/25/2020   Procedure: HOT HEMOSTASIS (ARGON PLASMA COAGULATION/BICAP);  Surgeon: Legrand Victory LITTIE DOUGLAS, MD;  Location: [facility];  Service: Gastroenterology;  Laterality: N/A;   LUNG BIOPSY N/A 02/27/2015   Procedure: LUNG BIOPSY;  Surgeon: Dallas KATHEE Jude, MD;  Location: [facility] OR;  Service: Thoracic;  Laterality: N/A;   right inguinal hernia repair at age 71     VIDEO BRONCHOSCOPY WITH ENDOBRONCHIAL ULTRASOUND N/A 02/27/2015   Procedure: VIDEO BRONCHOSCOPY WITH ENDOBRONCHIAL ULTRASOUND;  Surgeon: Dallas KATHEE Jude, MD;  Location: [facility] OR;  Service: Thoracic;  Laterality: N/A;     reports that he has been smoking cigarettes. He started smoking about 57 years ago. He has a 85.7 pack-year smoking history. He has been exposed to tobacco smoke. He has never used smokeless tobacco. He reports current alcohol use. He reports that he does not use drugs.  No Known Allergies  Family History  Problem Relation Age of Onset   Cancer Other    Heart disease Father    Lung cancer Father    Hypertension Other    Diabetes Mother    Valvular heart disease Mother    Diabetes Sister    Colon polyps Maternal Uncle    Diabetes Sister    Diabetes Sister    Diabetes Niece    Colon cancer Cousin    Esophageal cancer Neg Hx    Stomach cancer Neg Hx    Pancreatic cancer Neg Hx      Prior to Admission medications   Medication Sig Start Date End Date Taking? Authorizing Provider  acetaminophen  (TYLENOL ) 500 MG tablet Take 1,000 mg by mouth every 6 (six) hours as needed for moderate pain or headache.    [provider]  albuterol  (VENTOLIN  HFA) 108 (90 Base) MCG/ACT inhaler INHALE ONE TO TWO PUFFS BY MOUTH EVERY FOUR TO SIX HOURS AS NEEDED FOR SHORTNESS OF BREATH/WHEEZING 10/25/23   [name], FNP  amLODipine  (NORVASC ) 10 MG tablet Take 1 tablet (10 mg total) by mouth daily. 12/14/23   [name], FNP  aspirin  81 MG tablet Take  81 mg by mouth daily.    [provider]  atorvastatin  (LIPITOR) 40 MG tablet Take 1 tablet (40 mg total) by mouth daily. 12/14/23   [name], FNP  azelastine  (ASTELIN ) 0.1 % nasal spray Place 1 spray into both nostrils 2 (two) times daily. 12/14/23   [name], FNP  Cholecalciferol  (VITAMIN D3) 5000 UNITS TABS Take 5,000 Units by mouth daily.    [provider]  cyclobenzaprine  (FLEXERIL ) 5 MG tablet TAKE ONE TABLET (5 MG TOTAL) BY MOUTH THREE TIMES DAILY AS NEEDED FOR MUSCLE SPASMS. 11/30/23   [name], FNP  ferrous sulfate  325 (65 FE) MG tablet Take 1 tablet (325 mg total) by mouth daily with breakfast. 12/14/23   [name], FNP  finasteride  (PROSCAR ) 5 MG tablet Take 1 tablet (5 mg total) by mouth  daily. 12/14/23   Shirley, Amanda M, FNP  fluticasone  (FLONASE ) 50 MCG/ACT nasal spray Place 2 sprays into both nostrils daily as needed for allergies.  04/02/15   [provider]  Fluticasone -Umeclidin-Vilant (TRELEGY ELLIPTA ) 100-62.5-25 MCG/ACT AEPB Inhale 1 puff into the lungs daily. 12/14/23   [name], FNP  gabapentin (NEURONTIN) 100 MG capsule TAKE TWO CAPSULES BY MOUTH AT BEDTIME NIGHTLY FOR RESTLESS LEGS Patient not taking: Reported on 12/14/2023 11/15/22   [name], FNP  LORazepam  (ATIVAN ) 0.5 MG tablet Take 0.5 mg by mouth daily as needed for anxiety.    [provider]  losartan -hydrochlorothiazide  (HYZAAR) 100-12.5 MG tablet Take 1 tablet by mouth daily. 12/14/23   [name], FNP  tamsulosin  (FLOMAX ) 0.4 MG CAPS capsule Take 1 capsule (0.4 mg total) by mouth daily. 12/14/23   [name], FNP  traMADol  (ULTRAM ) 50 MG tablet Take 1 tablet (50 mg total) by mouth every 12 (twelve) hours as needed. 02/14/24   [name], FNP    Physical Exam: Vitals:   03/30/24 0737 03/30/24 0745 03/30/24 0800 03/30/24 0815  BP:   (!) 159/78   Pulse: 84 70 81 84  Resp: 19 (!) 23 20 15   Temp:      TempSrc:      SpO2:  95% 100% 99% 95%  Weight:      Height:        Constitutional: NAD, calm, comfortable Vitals:   03/30/24 0737 03/30/24 0745 03/30/24 0800 03/30/24 0815  BP:   (!) 159/78   Pulse: 84 70 81 84  Resp: 19 (!) 23 20 15   Temp:      TempSrc:      SpO2: 95% 100% 99% 95%  Weight:      Height:       Eyes: PERRL, lids and conjunctivae normal ENMT: Mucous membranes are moist. Posterior pharynx clear of any exudate or lesions.Normal dentition.  Neck: normal, supple, no masses, no thyromegaly Respiratory: Diminished breathing sound bilaterally, diffuse wheezing no crackles, increasing respiratory effort. No accessory muscle use.  Cardiovascular: Regular rate and rhythm, no murmurs / rubs / gallops.  2+ extremity edema. 2+ pedal pulses. No carotid bruits.  Abdomen: no tenderness, no masses palpated. No hepatosplenomegaly. Bowel sounds positive.  Musculoskeletal: no clubbing / cyanosis. No joint deformity upper and lower extremities. Good ROM, no contractures. Normal muscle tone.  Skin: no rashes, lesions, ulcers. No induration Neurologic: CN 2-12 grossly intact. Sensation intact, DTR normal. Strength 5/5 in all 4.  Psychiatric: Normal judgment and insight. Alert and oriented x 3. Normal mood.    Labs on Admission: I have personally reviewed following labs and imaging studies  CBC: Recent Labs  Lab 03/30/24 0510  WBC 7.6  NEUTROABS 5.8  HGB 10.4*  HCT 34.7*  MCV 80.0  PLT 215   Basic Metabolic Panel: Recent Labs  Lab 03/30/24 0245  NA 138  K 4.3  CL 93*  CO2 34*  GLUCOSE 95  BUN 17  CREATININE 0.89  CALCIUM  8.5*   GFR: Estimated Creatinine Clearance: 67.2 mL/min (by C-G formula based on SCr of 0.89 mg/dL). Liver Function Tests: Recent Labs  Lab 03/30/24 0245  AST 26  ALT 18  ALKPHOS 67  BILITOT 0.6  PROT 6.1*  ALBUMIN  3.0*   No results for input(s): LIPASE, AMYLASE in the last 168 hours. No results for input(s): AMMONIA in the last 168 hours. Coagulation  Profile: Recent Labs  Lab 03/30/24 0328  INR 1.1  Cardiac Enzymes: No results for input(s): CKTOTAL, CKMB, CKMBINDEX, TROPONINI in the last 168 hours. BNP (last 3 results) No results for input(s): PROBNP in the last 8760 hours. HbA1C: No results for input(s): HGBA1C in the last 72 hours. CBG: No results for input(s): GLUCAP in the last 168 hours. Lipid Profile: No results for input(s): CHOL, HDL, LDLCALC, TRIG, CHOLHDL, LDLDIRECT in the last 72 hours. Thyroid Function Tests: Recent Labs    03/30/24 0245  TSH 0.814   Anemia Panel: No results for input(s): VITAMINB12, FOLATE, FERRITIN, TIBC, IRON, RETICCTPCT in the last 72 hours. Urine analysis: No results found for: COLORURINE, APPEARANCEUR, LABSPEC, PHURINE, GLUCOSEU, HGBUR, BILIRUBINUR, KETONESUR, PROTEINUR, UROBILINOGEN, NITRITE, LEUKOCYTESUR  Radiological Exams on Admission: CT Angio Chest PE W/Cm &/Or Wo Cm Result Date: 03/30/2024 CLINICAL DATA:  Multiple falls with decreased p.o. intake. Increased weakness. Non-small-cell lung cancer. * Tracking Code: BO * EXAM: CT ANGIOGRAPHY CHEST WITH CONTRAST TECHNIQUE: Multidetector CT imaging of the chest was performed using the standard protocol during bolus administration of intravenous contrast. Multiplanar CT image reconstructions and MIPs were obtained to evaluate the vascular anatomy. RADIATION DOSE REDUCTION: This exam was performed according to the departmental dose-optimization program which includes automated exposure control, adjustment of the mA and/or kV according to patient size and/or use of iterative reconstruction technique. CONTRAST:  OMNIPAQUE  IOHEXOL  350 MG/ML SOLN COMPARISON:  Chest CT 07/21/2023 FINDINGS: Cardiovascular: The heart size is normal. No substantial pericardial effusion. Moderate atherosclerotic calcification is noted in the wall of the thoracic aorta. Enlargement of the pulmonary outflow  tract/main pulmonary arteries suggests pulmonary arterial hypertension. There is no filling defect within the opacified pulmonary arteries to suggest the presence of an acute pulmonary embolus. Mediastinum/Nodes: No mediastinal lymphadenopathy. Multinodular thyroid enlargement is similar to prior. There is no hilar lymphadenopathy. Similar soft tissue fullness in the right hilum is likely treatment related. The esophagus has normal imaging features. There is no axillary lymphadenopathy. Lungs/Pleura: Centrilobular and paraseptal emphysema evident. Architectural distortion and scarring in the suprahilar right lung is similar to prior and compatible with treatment related fibrosis. New patchy airspace disease identified posterior right lower lobe, suspicious for pneumonia and aspiration would be a consideration. 6 mm left upper lobe pulmonary nodule is stable in the interval on image 36/5. Subpleural 5 mm posterior right lung nodule on 101/5 is unchanged. Stable 7 mm subpleural left lower lobe nodule on 116/5. Upper Abdomen: Stable nodular thickening of both adrenal glands. Musculoskeletal: No worrisome lytic or sclerotic osseous abnormality. Review of the MIP images confirms the above findings. IMPRESSION: 1. No CT evidence for acute pulmonary embolus. 2. New patchy airspace disease posterior right lower lobe, suspicious for pneumonia and aspiration would be a consideration. 3. Stable architectural distortion and scarring in the suprahilar right lung compatible with treatment related fibrosis. 4. Stable bilateral pulmonary nodules. 5. Stable nodular thickening of both adrenal glands. 6. Aortic Atherosclerosis (ICD10-I70.0) and Emphysema (ICD10-J43.9). Electronically Signed   By: Camellia Candle M.D.   On: 03/30/2024 05:57   US  Venous Img Lower Bilateral Result Date: 03/30/2024 CLINICAL DATA:  Bilateral lower extremity swelling. EXAM: BILATERAL LOWER EXTREMITY VENOUS DOPPLER ULTRASOUND TECHNIQUE: Gray-scale  sonography with graded compression, as well as color Doppler and duplex ultrasound were performed to evaluate the lower extremity deep venous systems from the level of the common femoral vein and including the common femoral, femoral, profunda femoral, popliteal and calf veins including the posterior tibial, peroneal and gastrocnemius veins when visible. The superficial great saphenous vein was also  interrogated. Spectral Doppler was utilized to evaluate flow at rest and with distal augmentation maneuvers in the common femoral, femoral and popliteal veins. COMPARISON:  None Available. FINDINGS: RIGHT LOWER EXTREMITY Common Femoral Vein: No evidence of thrombus. Normal compressibility, respiratory phasicity and response to augmentation. Saphenofemoral Junction: No evidence of thrombus. Normal compressibility and flow on color Doppler imaging. Profunda Femoral Vein: No evidence of thrombus. Normal compressibility and flow on color Doppler imaging. Femoral Vein: No evidence of thrombus. Normal compressibility, respiratory phasicity and response to augmentation. Popliteal Vein: No evidence of thrombus. Normal compressibility, respiratory phasicity and response to augmentation. Calf Veins: No evidence of thrombus. Normal compressibility and flow on color Doppler imaging. Other Findings:  None. LEFT LOWER EXTREMITY Common Femoral Vein: No evidence of thrombus. Normal compressibility, respiratory phasicity and response to augmentation. Saphenofemoral Junction: No evidence of thrombus. Normal compressibility and flow on color Doppler imaging. Profunda Femoral Vein: No evidence of thrombus. Normal compressibility and flow on color Doppler imaging. Femoral Vein: No evidence of thrombus. Normal compressibility, respiratory phasicity and response to augmentation. Popliteal Vein: No evidence of thrombus. Normal compressibility, respiratory phasicity and response to augmentation. Calf Veins: No evidence of thrombus. Normal  compressibility and flow on color Doppler imaging. Other Findings:  None. IMPRESSION: No evidence of deep venous thrombosis in either lower extremity. Electronically Signed   By: Camellia Candle M.D.   On: 03/30/2024 05:19   DG Chest Portable 1 View Result Date: 03/30/2024 CLINICAL DATA:  Frequent falls, increased weakness. EXAM: PORTABLE CHEST 1 VIEW COMPARISON:  PA Lat chest 02/06/2018.  Chest CT 07/21/2023. FINDINGS: Post treatment changes, fibrotic consolidation and volume loss are again noted in the right upper lobe. The lungs are emphysematous and otherwise clear. There is chronic right lateral sulcal blunting. Mediastinal configuration appear stable with aortic uncoiling and atherosclerosis. The cardiac size is normal. The left sulci are sharp. Thoracic cage appears intact. Slight thoracic dextroscoliosis. IMPRESSION: 1. No evidence of acute chest disease. 2. Emphysema. 3. Post treatment changes, fibrotic consolidation and volume loss in the right upper lobe. 4. Aortic atherosclerosis. Electronically Signed   By: Francis Quam M.D.   On: 03/30/2024 03:02   CT Head Wo Contrast Result Date: 03/30/2024 EXAM: CT HEAD WITHOUT CONTRAST 03/30/2024 02:31:41 AM TECHNIQUE: CT of the head was performed without the administration of intravenous contrast. Automated exposure control, iterative reconstruction, and/or weight based adjustment of the mA/kV was utilized to reduce the radiation dose to as low as reasonably achievable. COMPARISON: MRI brain dated 02/21/2015. CLINICAL HISTORY: Head trauma, minor (Age >= 65y). Per ed notes; BIBA due to multiple falls - at least 3 per EMS. Reduced PO. Increased weakness. FINDINGS: BRAIN AND VENTRICLES: Global cortical atrophy. Subcortical and periventricular small vessel ischemic changes. No acute hemorrhage. Gray-white differentiation is preserved. No hydrocephalus. No extra-axial collection. No mass effect or midline shift. ORBITS: No acute abnormality. SINUSES: No acute  abnormality. SOFT TISSUES AND SKULL: No acute soft tissue abnormality. No skull fracture. IMPRESSION: 1. No acute intracranial abnormality. 2. Atrophy with small vessel ischemic changes. Electronically signed by: Pinkie Pebbles MD 03/30/2024 02:39 AM EDT RP Workstation: [id number]    EKG: Independently reviewed.  Sinus rhythm, no acute ST changes.  Assessment/Plan Principal Problem:   COPD exacerbation (HCC) Active Problems:   COPD (chronic obstructive pulmonary disease) (HCC)  (please populate well all problems here in Problem List. (For example, if patient is on BP meds at home and you resume or decide to hold them, it is a problem that  needs to be her. Same for CAD, COPD, HLD and so on)  Acute hypoxic and hypercapnic respiratory failure Acute COPD exacerbation RLL CAP bacterial -VBG showed improved CO2 retention and borderline respiratory alkalosis, will discontinue CPAP. - Antibiotics ceftriaxone  and azithromycin  - Solu-Medrol  x 2 bridging for p.o. prednisone  - DuoNebs plus as needed albuterol  - Incentive spirometry - PT OT evaluation  Acute, probably on chronic HFpEF decompensation - Clinically suspect right-sided CHF decompensation - IV Lasix  - Echocardiogram -TSH normal, UA is pending -Trop negative x1 and EKG is nonischemic, ACS ruled out  Near syncope - Likely secondary to COPD exacerbation and CHF decompensation - Continue telemonitoring to rule out significant arrhythmia - Echocardiogram - Check orthostatic vital signs - PT evaluation  Moderate protein calorie malnutrition Unintentional weight loss Question of dysphagia - Consult speech therapist, may need to consider barium swallow  HTN - Hold off amlodipine  given there is a worsening of peripheral edema - Hold off HCTZ, switched to IV Lasix  for aggressive diuresis - Continue therapy  BPH - Stable, continue Flomax  and Proscar   Cigarette smoking - Cessation education performed at bedside - Nicotine   patch  DVT prophylaxis: Lovenox  Code Status: Full code Family Communication: Wife at bedside Disposition Plan: Patient is sick with pneumonia and COPD exacerbation as well as new onset of CHF requiring inpatient CHF workup, expect more than 2 midnight hospital stay Consults called: None Admission status: PCU admiot   Cort ONEIDA Mana MD [facility] Pager [phone]  03/30/2024, 9:06 AM

## 2024-03-31 ENCOUNTER — Inpatient Hospital Stay
Admit: 2024-03-31 | Discharge: 2024-03-31 | Disposition: A | Discharge status: Home / Self Care | Attending: Internal Medicine | Admitting: Internal Medicine

## 2024-03-31 DIAGNOSIS — J441 Chronic obstructive pulmonary disease with (acute) exacerbation: Secondary | ICD-10-CM | POA: Diagnosis not present

## 2024-03-31 LAB — BASIC METABOLIC PANEL WITH GFR
Anion gap: 9 (ref 5–15)
BUN: 21 mg/dL (ref 8–23)
CO2: 40 mmol/L — ABNORMAL HIGH (ref 22–32)
Calcium: 8.7 mg/dL — ABNORMAL LOW (ref 8.9–10.3)
Chloride: 91 mmol/L — ABNORMAL LOW (ref 98–111)
Creatinine, Ser: 0.92 mg/dL (ref 0.61–1.24)
GFR, Estimated: >60 mL/min (ref >60)
Glucose, Bld: 128 mg/dL — ABNORMAL HIGH (ref 70–99)
Potassium: 3.7 mmol/L (ref 3.5–5.1)
Sodium: 140 mmol/L (ref 135–145)

## 2024-03-31 LAB — ECHOCARDIOGRAM COMPLETE
Height: 68 in
S' Lateral: 2.2 cm
Weight: 2186.96 [oz_av]

## 2024-03-31 LAB — HIV ANTIBODY (ROUTINE TESTING W REFLEX): HIV Screen 4th Generation wRfx: NONREACTIVE

## 2024-03-31 MED ORDER — IPRATROPIUM-ALBUTEROL 0.5-2.5 (3) MG/3ML IN SOLN
3.0000 mL | Freq: Two times a day (BID) | RESPIRATORY_TRACT | Status: DC
  Administered 2024-04-01 – 2024-04-04 (×7): 3 mL via RESPIRATORY_TRACT
  Filled 2024-03-31 (×7): qty 3

## 2024-03-31 MED ORDER — ADULT MULTIVITAMIN W/MINERALS CH
1.0000 | ORAL_TABLET | Freq: Every day | ORAL | Status: DC
  Administered 2024-04-01 – 2024-04-04 (×4): 1 via ORAL
  Filled 2024-03-31 (×4): qty 1

## 2024-03-31 MED ORDER — ENSURE PLUS HIGH PROTEIN PO LIQD
237.0000 mL | Freq: Three times a day (TID) | ORAL | Status: DC
  Administered 2024-03-31 – 2024-04-03 (×10): 237 mL via ORAL

## 2024-03-31 NOTE — Progress Notes (Signed)
 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  On arrival to ED patient required BiPAP to maintain O2 sats.  CTA negative for PE but was concerning for right lower lobe pneumonia.  Head CT negative for acute findings.  VBG 7.3 2/90/31.  He was admitted for treatment of COPD exacerbation, CHF exacerbation, and pneumonia.  Subjective: This morning patient is comfortably on 2 L O2.  He reports he is feeling much better.   Objective: Vitals:   03/31/24 1123 03/31/24 1249 03/31/24 1250 03/31/24 1253  BP: (!) 119/56 132/72 (!) 169/88 (!) 147/93  Pulse: (!) 103 88 88 88  Resp: 20     Temp: (!) 97.5 F (36.4 C)     TempSrc:      SpO2: 99%     Weight:      Height:        Intake/Output Summary (Last 24 hours) at 03/31/2024 1458 Last data filed at 03/31/2024 1300 Gross per 24 hour  Intake 490 ml  Output 450 ml  Net 40 ml   Filed Weights   03/30/24 0147 03/30/24 2302 03/31/24 0519  Weight: 79.2 kg 62 kg 62 kg    Examination: General exam: Appears calm and comfortable, NAD  Respiratory system: No work of breathing, 2 L O2 in place.  Poor aeration bilaterally Cardiovascular system: S1 & S2 heard, RRR.  Gastrointestinal system: Abdomen is nondistended, soft and nontender.  Neuro: Alert and oriented. No focal neurological deficits. Extremities: Symmetric, expected ROM Skin: No rashes, lesions Psychiatry: Demonstrates appropriate judgement and insight. Mood & affect appropriate for situation.   Assessment &  Plan:  Principal Problem:   COPD exacerbation (HCC) Active Problems:   COPD (chronic obstructive pulmonary disease) (HCC)    Acute hypoxic and hypercapnic respiratory failure Acute COPD exacerbation RLL CAP, bacteria unknown - VBG with improved CO2.  Patient is tapered off of CPAP now. - Continue supplemental O2 as needed.  Wean as able - Continue with ceftriaxone  azithromycin  - Continue with IV steroids, taper to p.o. prednisone  - Continue with DuoNebs - Encourage incentive parameter and flutter valve - PT/OT eval's  CHF - Patient carries diagnosis of heart failure and chart review though I cannot find a prior echocardiogram for review - Clinically patient appears to be volume overloaded - Currently on IV Lasix  - Continue to follow strict I's and O's.  Bedside RN reports patient is having difficulty with condom cath and thus output not being recorded appropriately - Echocardiogram has been ordered, will initiate GDMT per results  Near syncope - Likely secondary to hypoxic and hypercapnic respiratory failure as above - Continue monitor on telemetry - Echo as above - Orthostatic vitals ordered - PT/OT evaluations  Moderate protein calorie malnutrition Unintentional weight loss - Questionable dysphagia - SLP consult  Hypertension - Currently on IV Lasix , initiate additional medications as needed - Monitor BP, titrate closely  BPH - Continue home dose Flomax  and Proscar   Tobacco abuse - Nicotine  patch.  Cessation counseling  Medication nonadherence - Complicating all of the above.  Patient has been  counseled on taking all medications as prescribed.  He will be provided a new medication list at discharge, we will ensure all medications are affordable  DVT prophylaxis: Lovenox    Code Status: Full Code Disposition: Inpatient pending clinical improvement.  Still on oxygen.  Still pending workup  Consultants:    Procedures:    Antimicrobials:  Anti-infectives  (From admission, onward)    Start     Dose/Rate Route Frequency Ordered Stop   03/31/24 1000  azithromycin  (ZITHROMAX ) 500 mg in sodium chloride  0.9 % 250 mL IVPB        500 mg 250 mL/hr over 60 Minutes Intravenous Every 24 hours 03/30/24 0844     03/30/24 1000  cefTRIAXone  (ROCEPHIN ) 2 g in sodium chloride  0.9 % 100 mL IVPB        2 g 200 mL/hr over 30 Minutes Intravenous Every 24 hours 03/30/24 0844     03/30/24 0615  ceFEPIme  (MAXIPIME ) 2 g in sodium chloride  0.9 % 100 mL IVPB        2 g 200 mL/hr over 30 Minutes Intravenous  Once 03/30/24 0614 03/30/24 0752   03/30/24 0615  azithromycin  (ZITHROMAX ) 500 mg in sodium chloride  0.9 % 250 mL IVPB        500 mg 250 mL/hr over 60 Minutes Intravenous  Once 03/30/24 9385 03/30/24 0915       Data Reviewed: I have personally reviewed following labs and imaging studies CBC: Recent Labs  Lab 03/30/24 0510  WBC 7.6  NEUTROABS 5.8  HGB 10.4*  HCT 34.7*  MCV 80.0  PLT 215   Basic Metabolic Panel: Recent Labs  Lab 03/30/24 0245 03/31/24 0423  NA 138 140  K 4.3 3.7  CL 93* 91*  CO2 34* 40*  GLUCOSE 95 128*  BUN 17 21  CREATININE 0.89 0.92  CALCIUM  8.5* 8.7*   GFR: Estimated Creatinine Clearance: 59 mL/min (by C-G formula based on SCr of 0.92 mg/dL). Liver Function Tests: Recent Labs  Lab 03/30/24 0245  AST 26  ALT 18  ALKPHOS 67  BILITOT 0.6  PROT 6.1*  ALBUMIN  3.0*   CBG: No results for input(s): GLUCAP in the last 168 hours.  Recent Results (from the past 240 hours)  Blood culture (routine x 2)     Status: None (Preliminary result)   Collection Time: 03/30/24  7:04 AM   Specimen: BLOOD LEFT ARM  Result Value Ref Range Status   Specimen Description BLOOD LEFT ARM  Final   Special Requests   Final    BOTTLES DRAWN AEROBIC AND ANAEROBIC Blood Culture adequate volume   Culture   Final    NO GROWTH < 24 HOURS Performed at [facility], [address] [place], [place] [address]    Report Status  PENDING  Incomplete  Blood culture (routine x 2)     Status: None (Preliminary result)   Collection Time: 03/30/24 11:26 PM   Specimen: BLOOD  Result Value Ref Range Status   Specimen Description BLOOD BLOOD RIGHT ARM  Final   Special Requests   Final    BOTTLES DRAWN AEROBIC AND ANAEROBIC Blood Culture adequate volume   Culture   Final    NO GROWTH < 12 HOURS Performed at [facility], [address], [place], [place] [address]    Report Status PENDING  Incomplete     Radiology Studies: CT Angio Chest PE W/Cm &/Or Wo Cm Result Date: 03/30/2024 CLINICAL DATA:  Multiple falls with decreased p.o. intake.  Increased weakness. Non-small-cell lung cancer. * Tracking Code: BO * EXAM: CT ANGIOGRAPHY CHEST WITH CONTRAST TECHNIQUE: Multidetector CT imaging of the chest was performed using the standard protocol during bolus administration of intravenous contrast. Multiplanar CT image reconstructions and MIPs were obtained to evaluate the vascular anatomy. RADIATION DOSE REDUCTION: This exam was performed according to the departmental dose-optimization program which includes automated exposure control, adjustment of the mA and/or kV according to patient size and/or use of iterative reconstruction technique. CONTRAST:  OMNIPAQUE  IOHEXOL  350 MG/ML SOLN COMPARISON:  Chest CT 07/21/2023 FINDINGS: Cardiovascular: The heart size is normal. No substantial pericardial effusion. Moderate atherosclerotic calcification is noted in the wall of the thoracic aorta. Enlargement of the pulmonary outflow tract/main pulmonary arteries suggests pulmonary arterial hypertension. There is no filling defect within the opacified pulmonary arteries to suggest the presence of an acute pulmonary embolus. Mediastinum/Nodes: No mediastinal lymphadenopathy. Multinodular thyroid enlargement is similar to prior. There is no hilar lymphadenopathy. Similar soft tissue fullness in the right hilum is likely treatment related. The  esophagus has normal imaging features. There is no axillary lymphadenopathy. Lungs/Pleura: Centrilobular and paraseptal emphysema evident. Architectural distortion and scarring in the suprahilar right lung is similar to prior and compatible with treatment related fibrosis. New patchy airspace disease identified posterior right lower lobe, suspicious for pneumonia and aspiration would be a consideration. 6 mm left upper lobe pulmonary nodule is stable in the interval on image 36/5. Subpleural 5 mm posterior right lung nodule on 101/5 is unchanged. Stable 7 mm subpleural left lower lobe nodule on 116/5. Upper Abdomen: Stable nodular thickening of both adrenal glands. Musculoskeletal: No worrisome lytic or sclerotic osseous abnormality. Review of the MIP images confirms the above findings. IMPRESSION: 1. No CT evidence for acute pulmonary embolus. 2. New patchy airspace disease posterior right lower lobe, suspicious for pneumonia and aspiration would be a consideration. 3. Stable architectural distortion and scarring in the suprahilar right lung compatible with treatment related fibrosis. 4. Stable bilateral pulmonary nodules. 5. Stable nodular thickening of both adrenal glands. 6. Aortic Atherosclerosis (ICD10-I70.0) and Emphysema (ICD10-J43.9). Electronically Signed   By: Camellia Candle M.D.   On: 03/30/2024 05:57   US  Venous Img Lower Bilateral Result Date: 03/30/2024 CLINICAL DATA:  Bilateral lower extremity swelling. EXAM: BILATERAL LOWER EXTREMITY VENOUS DOPPLER ULTRASOUND TECHNIQUE: Gray-scale sonography with graded compression, as well as color Doppler and duplex ultrasound were performed to evaluate the lower extremity deep venous systems from the level of the common femoral vein and including the common femoral, femoral, profunda femoral, popliteal and calf veins including the posterior tibial, peroneal and gastrocnemius veins when visible. The superficial great saphenous vein was also interrogated.  Spectral Doppler was utilized to evaluate flow at rest and with distal augmentation maneuvers in the common femoral, femoral and popliteal veins. COMPARISON:  None Available. FINDINGS: RIGHT LOWER EXTREMITY Common Femoral Vein: No evidence of thrombus. Normal compressibility, respiratory phasicity and response to augmentation. Saphenofemoral Junction: No evidence of thrombus. Normal compressibility and flow on color Doppler imaging. Profunda Femoral Vein: No evidence of thrombus. Normal compressibility and flow on color Doppler imaging. Femoral Vein: No evidence of thrombus. Normal compressibility, respiratory phasicity and response to augmentation. Popliteal Vein: No evidence of thrombus. Normal compressibility, respiratory phasicity and response to augmentation. Calf Veins: No evidence of thrombus. Normal compressibility and flow on color Doppler imaging. Other Findings:  None. LEFT LOWER EXTREMITY Common Femoral Vein: No evidence of thrombus. Normal compressibility, respiratory phasicity and response to augmentation. Saphenofemoral Junction: No evidence  of thrombus. Normal compressibility and flow on color Doppler imaging. Profunda Femoral Vein: No evidence of thrombus. Normal compressibility and flow on color Doppler imaging. Femoral Vein: No evidence of thrombus. Normal compressibility, respiratory phasicity and response to augmentation. Popliteal Vein: No evidence of thrombus. Normal compressibility, respiratory phasicity and response to augmentation. Calf Veins: No evidence of thrombus. Normal compressibility and flow on color Doppler imaging. Other Findings:  None. IMPRESSION: No evidence of deep venous thrombosis in either lower extremity. Electronically Signed   By: Camellia Candle M.D.   On: 03/30/2024 05:19   DG Chest Portable 1 View Result Date: 03/30/2024 CLINICAL DATA:  Frequent falls, increased weakness. EXAM: PORTABLE CHEST 1 VIEW COMPARISON:  PA Lat chest 02/06/2018.  Chest CT 07/21/2023.  FINDINGS: Post treatment changes, fibrotic consolidation and volume loss are again noted in the right upper lobe. The lungs are emphysematous and otherwise clear. There is chronic right lateral sulcal blunting. Mediastinal configuration appear stable with aortic uncoiling and atherosclerosis. The cardiac size is normal. The left sulci are sharp. Thoracic cage appears intact. Slight thoracic dextroscoliosis. IMPRESSION: 1. No evidence of acute chest disease. 2. Emphysema. 3. Post treatment changes, fibrotic consolidation and volume loss in the right upper lobe. 4. Aortic atherosclerosis. Electronically Signed   By: Francis Quam M.D.   On: 03/30/2024 03:02   CT Head Wo Contrast Result Date: 03/30/2024 EXAM: CT HEAD WITHOUT CONTRAST 03/30/2024 02:31:41 AM TECHNIQUE: CT of the head was performed without the administration of intravenous contrast. Automated exposure control, iterative reconstruction, and/or weight based adjustment of the mA/kV was utilized to reduce the radiation dose to as low as reasonably achievable. COMPARISON: MRI brain dated 02/21/2015. CLINICAL HISTORY: Head trauma, minor (Age >= 65y). Per ed notes; BIBA due to multiple falls - at least 3 per EMS. Reduced PO. Increased weakness. FINDINGS: BRAIN AND VENTRICLES: Global cortical atrophy. Subcortical and periventricular small vessel ischemic changes. No acute hemorrhage. Gray-white differentiation is preserved. No hydrocephalus. No extra-axial collection. No mass effect or midline shift. ORBITS: No acute abnormality. SINUSES: No acute abnormality. SOFT TISSUES AND SKULL: No acute soft tissue abnormality. No skull fracture. IMPRESSION: 1. No acute intracranial abnormality. 2. Atrophy with small vessel ischemic changes. Electronically signed by: Pinkie Pebbles MD 03/30/2024 02:39 AM EDT RP Workstation: [id number]    Scheduled Meds:  aspirin  EC  81 mg Oral Daily   atorvastatin   40 mg Oral Daily   azelastine   1 spray Each Nare BID    budesonide -glycopyrrolate -formoterol   2 puff Inhalation BID   enoxaparin  (LOVENOX ) injection  40 mg Subcutaneous Q24H   finasteride   5 mg Oral Daily   furosemide   40 mg Intravenous BID   ipratropium-albuterol   3 mL Nebulization Q6H   losartan   50 mg Oral Daily   nicotine   21 mg Transdermal Daily   predniSONE   40 mg Oral Q breakfast   sodium chloride  flush  3 mL Intravenous Q12H   tamsulosin   0.4 mg Oral Daily   Continuous Infusions:  azithromycin  500 mg (03/31/24 1028)   cefTRIAXone  (ROCEPHIN )  IV 2 g (03/31/24 0948)     LOS: 1 day  MDM: Patient is high risk for one or more organ failure.  They necessitate ongoing hospitalization for continued IV therapies and subsequent lab monitoring. Total time spent interpreting labs and vitals, reviewing the medical record, coordinating care amongst consultants and care team members, directly assessing and discussing care with the patient and/or family: 55 min  Fedra Lanter, DO [facility]  To contact the  attending physician between 7A-7P please use Epic Chat. To contact the covering physician during after hours 7P-7A, please review Amion.  03/31/2024, 2:58 PM   *This document has been created with the assistance of dictation software. Please excuse typographical errors. *

## 2024-03-31 NOTE — Progress Notes (Signed)
*  PRELIMINARY RESULTS* Echocardiogram 2D Echocardiogram has been performed.  Colton Nguyen 03/31/2024, 8:31 AM

## 2024-03-31 NOTE — Progress Notes (Signed)
 Initial Nutrition Assessment  DOCUMENTATION CODES:   Severe malnutrition in context of chronic illness  INTERVENTION:   Ensure Plus High Protein po TID, each supplement provides 350 kcal and 20 grams of protein  Magic cup TID with meals, each supplement provides 290 kcal and 9 grams of protein  MVI po daily   Liberalize diet   Pt at high refeed risk; recommend monitor potassium, magnesium  and phosphorus labs daily until stable  Daily weights   NUTRITION DIAGNOSIS:   Severe Malnutrition related to chronic illness as evidenced by percent weight loss, severe fat depletion, severe muscle depletion.  GOAL:   Patient will meet greater than or equal to 90% of their needs  MONITOR:   PO intake, Supplement acceptance, Labs, Weight trends, Skin, I & O's  REASON FOR ASSESSMENT:   Consult Assessment of nutrition requirement/status  ASSESSMENT:   78 y/o male with h/o COPD, HTN, BPH, IDA, anxiety, HLD, TB, Stage IIIA non-small cell lung cancer s/p chemoradiation (squamous cell carcinoma diagnosed in June 2016) and CHF who is admitted with PNA and COPD exacerbation.  Met with pt in room today. Pt reports good appetite and oral intake pta and in hospital. Pt reports eating most of his meals today. Per chart, pt is documented to be eating 100% of meals. Pt reports some weight loss pta but is unsure how much. Per chart, pt is down 36lbs(20%) over the past 7 months; this is severe weight loss. RD discussed with pt the importance of adequate nutrition needed to preserve lean muscle. Pt reports that he drinks chocolate Ensure at home and is willing to drink this in hospital. RD will add supplements and MVI. RD will also liberalize pt's diet. Pt is at high refeed risk.   Medications reviewed and include: aspirin , lovenox , lasix , nicotine , prednisone , azithromycin , ceftriaxone    Labs reviewed: K 3.7 wnl Hgb 10.4(L), Hct 34.7(L)- 7/24  NUTRITION - FOCUSED PHYSICAL EXAM:  Flowsheet Row Most  Recent Value  Orbital Region Moderate depletion  Upper Arm Region Severe depletion  Thoracic and Lumbar Region Severe depletion  Buccal Region Moderate depletion  Temple Region Moderate depletion  Clavicle Bone Region Severe depletion  Clavicle and Acromion Bone Region Severe depletion  Scapular Bone Region Moderate depletion  Dorsal Hand Severe depletion  Patellar Region Severe depletion  Anterior Thigh Region Severe depletion  Posterior Calf Region Severe depletion  Edema (RD Assessment) None  Hair Reviewed  Eyes Reviewed  Mouth Reviewed  Skin Reviewed  Nails Reviewed   Diet Order:   Diet Order             Diet Heart Room service appropriate? Yes; Fluid consistency: Thin  Diet effective now                  EDUCATION NEEDS:   Education needs have been addressed  Skin:  Skin Assessment: Reviewed RN Assessment  Last BM:  pta  Height:   Ht Readings from Last 1 Encounters:  03/30/24 5' 8 (1.727 m)    Weight:   Wt Readings from Last 1 Encounters:  03/31/24 62.9 kg    Ideal Body Weight:  70 kg  BMI:  Body mass index is 21.08 kg/m.  Estimated Nutritional Needs:   Kcal:  1800-2100kcal/day  Protein:  90-105g/day  Fluid:  1.7-1.9L/day  Augustin Shams MS, RD, LDN If unable to be reached, please send secure chat to RD inpatient available from 8:00a-4:00p daily

## 2024-03-31 NOTE — Plan of Care (Signed)

## 2024-04-01 DIAGNOSIS — E43 Unspecified severe protein-calorie malnutrition: Secondary | ICD-10-CM | POA: Insufficient documentation

## 2024-04-01 DIAGNOSIS — J441 Chronic obstructive pulmonary disease with (acute) exacerbation: Secondary | ICD-10-CM | POA: Diagnosis not present

## 2024-04-01 LAB — BLOOD GAS, VENOUS
Acid-Base Excess: 17.3 mmol/L — ABNORMAL HIGH (ref 0.0–2.0)
Bicarbonate: 48.4 mmol/L — ABNORMAL HIGH (ref 20.0–28.0)
O2 Saturation: 37.9 %
Patient temperature: 37
pCO2, Ven: 94 mmHg (ref 44–60)
pH, Ven: 7.32 (ref 7.25–7.43)

## 2024-04-01 LAB — URINALYSIS, W/ REFLEX TO CULTURE (INFECTION SUSPECTED)
Bacteria, UA: NONE SEEN
Bilirubin Urine: NEGATIVE
Glucose, UA: NEGATIVE mg/dL
Hgb urine dipstick: NEGATIVE
Ketones, ur: NEGATIVE mg/dL
Leukocytes,Ua: NEGATIVE
Nitrite: NEGATIVE
Protein, ur: NEGATIVE mg/dL
Specific Gravity, Urine: 1.006 (ref 1.005–1.030)
pH: 8 (ref 5.0–8.0)

## 2024-04-01 LAB — BASIC METABOLIC PANEL WITH GFR
Anion gap: 8 (ref 5–15)
BUN: 33 mg/dL — ABNORMAL HIGH (ref 8–23)
CO2: 39 mmol/L — ABNORMAL HIGH (ref 22–32)
Calcium: 9 mg/dL (ref 8.9–10.3)
Chloride: 90 mmol/L — ABNORMAL LOW (ref 98–111)
Creatinine, Ser: 0.95 mg/dL (ref 0.61–1.24)
GFR, Estimated: >60 mL/min (ref >60)
Glucose, Bld: 89 mg/dL (ref 70–99)
Potassium: 3.4 mmol/L — ABNORMAL LOW (ref 3.5–5.1)
Sodium: 137 mmol/L (ref 135–145)

## 2024-04-01 LAB — PHOSPHORUS: Phosphorus: 2.7 mg/dL (ref 2.5–4.6)

## 2024-04-01 LAB — MAGNESIUM: Magnesium: 1.9 mg/dL (ref 1.7–2.4)

## 2024-04-01 MED ORDER — POTASSIUM CHLORIDE CRYS ER 20 MEQ PO TBCR
40.0000 meq | EXTENDED_RELEASE_TABLET | Freq: Once | ORAL | Status: AC
  Administered 2024-04-01: 40 meq via ORAL
  Filled 2024-04-01: qty 2

## 2024-04-01 MED ORDER — AZITHROMYCIN 250 MG PO TABS
500.0000 mg | ORAL_TABLET | Freq: Every day | ORAL | Status: DC
  Administered 2024-04-02 – 2024-04-04 (×3): 500 mg via ORAL
  Filled 2024-04-01 (×3): qty 2

## 2024-04-01 NOTE — Progress Notes (Signed)
 PHARMACIST - PHYSICIAN COMMUNICATION  DR:   Leesa  CONCERNING: IV to Oral Route Change Policy  RECOMMENDATION: This patient is receiving Azithromycin  by the intravenous route.  Based on criteria approved by the Pharmacy and Therapeutics Committee, the intravenous medication(s) is/are being converted to the equivalent oral dose form(s).   DESCRIPTION: These criteria include: The patient is eating (either orally or via tube) and/or has been taking other orally administered medications for a least 24 hours The patient has no evidence of active gastrointestinal bleeding or impaired GI absorption (gastrectomy, short bowel, patient on TNA or NPO).   Shaleah Nissley Rodriguez-Guzman PharmD, BCPS 04/01/2024 12:43 PM

## 2024-04-01 NOTE — Progress Notes (Addendum)
 Pt has removed telemetry several times overnight. Unable to put leads back on due to pt agitation regarding telemetry. Leads remain off. Erminio Cone, NP notified. No new orders. CCMD notified to put pt on standby.

## 2024-04-01 NOTE — Care Management Important Message (Signed)
 Important Message  Patient Details  Name: Colton Nguyen MRN: [id number] Date of Birth: [birth year]   Important Message Given:  Yes - Medicare IM     [name] 04/01/2024, 1:38 PM

## 2024-04-01 NOTE — Progress Notes (Addendum)
 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  On arrival to ED patient required BiPAP to maintain O2 sats.  CTA negative for PE but was concerning for right lower lobe pneumonia.  Head CT negative for acute findings.  VBG 7.3 2/90/31.  He was admitted for treatment of COPD exacerbation, and pneumonia.  Subjective: No acute events overnight. On evaluation today patient has no complaints but would like for me to call his sister.  I discussed his care with his Sister Colton Nguyen on the phone.  She reports that the patient has become increasingly deconditioned outpatient and has had some evidence of forgetfulness and medication nonadherence.  She reports that the plans are trying to help as much as possible, but believes he needs consistent daily help.  The patient lives with his son who has autism.  Objective: Vitals:   04/01/24 0322 04/01/24 0501 04/01/24 0745 04/01/24 1121  BP: (!) 177/84  (!) 169/94 121/88  Pulse: 87  77 (!) 106  Resp: (!) 22  14 14   Temp: 98 F (36.7 C)  98.2 F (36.8 C) 98.7 F (37.1 C)  TempSrc:      SpO2: 94%  99% 100%  Weight:  62.7 kg    Height:        Intake/Output Summary (Last 24 hours) at 04/01/2024 1355 Last data filed at 04/01/2024 1127 Gross per 24 hour  Intake 1080 ml  Output 1535 ml  Net -455 ml   Filed Weights   03/31/24 0519 03/31/24 1755 04/01/24 0501  Weight: 62 kg 62.9 kg 62.7 kg    Examination: General exam: Appears calm and comfortable, NAD  Respiratory system: No  work of breathing, 2 L O2 in place.  Poor aeration bilaterally Cardiovascular system: S1 & S2 heard, RRR.  Gastrointestinal system: Abdomen is nondistended, soft and nontender.  Neuro: Alert and oriented. No focal neurological deficits. Extremities: Symmetric, expected ROM Skin: No rashes, lesions Psychiatry: Mood & affect appropriate for situation.   Assessment & Plan:  Principal Problem:   COPD exacerbation (HCC) Active Problems:   COPD (chronic obstructive pulmonary disease) (HCC)   Protein-calorie malnutrition, severe    Acute on chronic hypoxic and hypercapnic respiratory failure Acute COPD exacerbation RLL CAP, bacteria unknown - VBG with improved CO2.  Patient is tapered off of CPAP now. - Continue supplemental O2 as needed.  Wean as able - At baseline patient requires 2 L O2 - Continue ceftriaxone  azithromycin  - Continue IV steroids, tapered to p.o. prednisone . - Continue with DuoNebs - Encourage incentive spirometer and flutter valve  Generalized weakness Chronic bilateral low back pain with bilateral psych attic, primary osteoarthritis involving multiple joints - Patient endorses generalized weakness which has been progressive over the last many months.  He recently saw PCP who arranged for home health, wheelchair, lift chair - Per physical therapy evaluations earlier this admission patient will likely require SNF at discharge.  TOC has been consulted to help in these arrangements.  CHF, ruled out - Patient carries diagnosis of heart failure on chart review though I cannot find  prior echocardiogram for review - During this admission repeat echocardiogram reveals preserved EF with indeterminate diastolic parameters and no significant valvular disease - Patient was initially IV Lasix .  This has been discontinued - Will follow I's and O's though bedside RN reports these are not accurate as patient is having significant difficulty getting urine in bedside urinal or keeping  on condom cath  Primary cancer right upper lobe of the lung - Prior stage IIIa non-small cell lung cancer. has been on observation since 2016 - Follows outpatient with oncology  Near syncope - Likely secondary to hypoxic and hypercapnic respiratory failure as above - Continue monitor on telemetry - Echo as above - Orthostatics negative - PT/OT, will need SNF  Moderate protein calorie malnutrition Unintentional weight loss - Questionable dysphagia - SLP consult reports no concerns of dysphagia.  Patient is tolerating regular diet.  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  Cessation counseling  Medication nonadherence - Complicating all of the above.  Patient has been counseled on taking all medications as prescribed.  He will be provided a new medication list at discharge, we will ensure all medications are affordable  Mixed hyperlipidemia -- cont statin  Prediabetes - Continue outpatient follow-up with PCP  Hypokalemia -- replace as needed. --Trend CMP  DVT prophylaxis: Lovenox    Code Status: Full Code Disposition: Inpatient pending clinical improvement.    Consultants:    Procedures:    Antimicrobials:  Anti-infectives (From admission, onward)    Start     Dose/Rate Route Frequency Ordered Stop   04/02/24 1000  azithromycin  (ZITHROMAX ) tablet 500 mg        500 mg Oral Daily 04/01/24 1243     03/31/24 1000  azithromycin  (ZITHROMAX ) 500 mg in sodium chloride  0.9 % 250 mL IVPB  Status:  Discontinued        500 mg 250 mL/hr over 60 Minutes Intravenous Every 24 hours 03/30/24 0844 04/01/24 1242   03/30/24 1000  cefTRIAXone  (ROCEPHIN ) 2 g in sodium chloride  0.9 % 100 mL IVPB        2 g 200 mL/hr over 30 Minutes Intravenous Every 24 hours 03/30/24 0844     03/30/24 0615  ceFEPIme  (MAXIPIME ) 2 g in sodium chloride  0.9 % 100 mL IVPB         2 g 200 mL/hr over 30 Minutes Intravenous  Once 03/30/24 0614 03/30/24 0752   03/30/24 0615  azithromycin  (ZITHROMAX ) 500 mg in sodium chloride  0.9 % 250 mL IVPB        500 mg 250 mL/hr over 60 Minutes Intravenous  Once 03/30/24 9385 03/30/24 0915       Data Reviewed: I have personally reviewed following labs and imaging studies CBC: Recent Labs  Lab 03/30/24 0510  WBC 7.6  NEUTROABS 5.8  HGB 10.4*  HCT 34.7*  MCV 80.0  PLT 215   Basic Metabolic Panel: Recent Labs  Lab 03/30/24 0245 03/31/24 0423 04/01/24 0228  NA 138 140 137  K 4.3 3.7 3.4*  CL 93* 91* 90*  CO2 34* 40* 39*  GLUCOSE 95 128* 89  BUN 17 21 33*  CREATININE 0.89 0.92 0.95  CALCIUM  8.5* 8.7* 9.0  MG  --   --  1.9  PHOS  --   --  2.7   GFR: Estimated Creatinine Clearance: 57.8 mL/min (by C-G formula based on SCr of 0.95 mg/dL). Liver Function  Tests: Recent Labs  Lab 03/30/24 0245  AST 26  ALT 18  ALKPHOS 67  BILITOT 0.6  PROT 6.1*  ALBUMIN  3.0*   CBG: No results for input(s): GLUCAP in the last 168 hours.  Recent Results (from the past 240 hours)  Blood culture (routine x 2)     Status: None (Preliminary result)   Collection Time: 03/30/24  7:04 AM   Specimen: BLOOD LEFT ARM  Result Value Ref Range Status   Specimen Description BLOOD LEFT ARM  Final   Special Requests   Final    BOTTLES DRAWN AEROBIC AND ANAEROBIC Blood Culture adequate volume   Culture   Final    NO GROWTH 2 DAYS Performed at [facility], [address] [place], [place] [address]    Report Status PENDING  Incomplete  Blood culture (routine x 2)     Status: None (Preliminary result)   Collection Time: 03/30/24 11:26 PM   Specimen: BLOOD  Result Value Ref Range Status   Specimen Description BLOOD BLOOD RIGHT ARM  Final   Special Requests   Final    BOTTLES DRAWN AEROBIC AND ANAEROBIC Blood Culture adequate volume   Culture   Final    NO GROWTH 2 DAYS Performed at [facility], [address], [place], [place] [address]    Report Status PENDING  Incomplete     Radiology Studies: ECHOCARDIOGRAM COMPLETE Result Date: 03/31/2024    ECHOCARDIOGRAM REPORT   Patient Name:   Colton Nguyen [facility] Date of Exam: 03/31/2024 Medical Rec #:  [id number]           Height:       68.0 in Accession #:    [id number]          Weight:       136.7 lb Date of Birth:  04-20-1946           BSA:          1.738 m Patient Age:    78 years            BP:           158/73 mmHg Patient Gender: M                   HR:           71 bpm. Exam Location:  [facility] Procedure: 2D Echo, Cardiac Doppler and Color Doppler (Both Spectral and Color            Flow Doppler were utilized during procedure). Indications:     CHF-acute diastolic I50.31  History:         Patient has no prior history of Echocardiogram examinations.                  CHF, COPD; Risk Factors:Hypertension.  Sonographer:     Christopher Furnace Referring Phys:  [id number] CORT ONEIDA MANA Diagnosing Phys: Marsa Dooms MD  Sonographer Comments: No parasternal window and no apical window. Image acquisition challenging due to COPD and Image acquisition challenging due to patient body habitus. IMPRESSIONS  1. Left ventricular ejection fraction, by estimation, is 60 to 65%. The left ventricle has normal function. The left ventricle has no regional wall motion abnormalities. Left ventricular diastolic parameters are indeterminate.  2. Right ventricular systolic function is normal. The right ventricular size is normal.  3. The mitral valve is normal in structure. Trivial mitral valve regurgitation. No evidence of mitral stenosis.  4. The aortic valve  is normal in structure. Aortic valve regurgitation is not visualized. No aortic stenosis is present.  5. The inferior vena cava is normal in size with greater than 50% respiratory variability, suggesting right atrial pressure of 3 mmHg. FINDINGS  Left Ventricle: Left ventricular ejection fraction, by estimation, is 60 to 65%. The left  ventricle has normal function. The left ventricle has no regional wall motion abnormalities. Strain was performed and the global longitudinal strain is indeterminate. The left ventricular internal cavity size was normal in size. There is no left ventricular hypertrophy. Left ventricular diastolic parameters are indeterminate. Right Ventricle: The right ventricular size is normal. No increase in right ventricular wall thickness. Right ventricular systolic function is normal. Left Atrium: Left atrial size was normal in size. Right Atrium: Right atrial size was normal in size. Pericardium: There is no evidence of pericardial effusion. Mitral Valve: The mitral valve is normal in structure. Trivial mitral valve regurgitation. No evidence of mitral valve stenosis. Tricuspid Valve: The tricuspid valve is normal in structure. Tricuspid valve regurgitation is trivial. No evidence of tricuspid stenosis. Aortic Valve: The aortic valve is normal in structure. Aortic valve regurgitation is not visualized. No aortic stenosis is present. Pulmonic Valve: The pulmonic valve was normal in structure. Pulmonic valve regurgitation is not visualized. No evidence of pulmonic stenosis. Aorta: The aortic root is normal in size and structure. Venous: The inferior vena cava is normal in size with greater than 50% respiratory variability, suggesting right atrial pressure of 3 mmHg. IAS/Shunts: No atrial level shunt detected by color flow Doppler. Additional Comments: 3D was performed not requiring image post processing on an independent workstation and was indeterminate.  LEFT VENTRICLE PLAX 2D LVIDd:         3.70 cm LVIDs:         2.20 cm LV PW:         1.40 cm LV IVS:        0.80 cm  LEFT ATRIUM           Index LA Vol (A4C): 45.1 ml 25.94 ml/m Marsa Dooms MD Electronically signed by Marsa Dooms MD Signature Date/Time: 03/31/2024/3:16:38 PM    Final     Scheduled Meds:  aspirin  EC  81 mg Oral Daily   atorvastatin   40 mg  Oral Daily   azelastine   1 spray Each Nare BID   [START ON 04/02/2024] azithromycin   500 mg Oral Daily   budesonide -glycopyrrolate -formoterol   2 puff Inhalation BID   enoxaparin  (LOVENOX ) injection  40 mg Subcutaneous Q24H   feeding supplement  237 mL Oral TID BM   finasteride   5 mg Oral Daily   ipratropium-albuterol   3 mL Nebulization BID   losartan   50 mg Oral Daily   multivitamin with minerals  1 tablet Oral Daily   nicotine   21 mg Transdermal Daily   predniSONE   40 mg Oral Q breakfast   sodium chloride  flush  3 mL Intravenous Q12H   tamsulosin   0.4 mg Oral Daily   Continuous Infusions:  cefTRIAXone  (ROCEPHIN )  IV 2 g (04/01/24 0905)     LOS: 2 days  MDM: Patient is high risk for one or more organ failure.  They necessitate ongoing hospitalization for continued IV therapies and subsequent lab monitoring. Total time spent interpreting labs and vitals, reviewing the medical record, coordinating care amongst consultants and care team members, directly assessing and discussing care with the patient and/or family: 55 min  Pacey Altizer, DO [facility]  To contact the attending physician  between 7A-7P please use Epic Chat. To contact the covering physician during after hours 7P-7A, please review Amion.  04/01/2024, 1:55 PM   *This document has been created with the assistance of dictation software. Please excuse typographical errors. *

## 2024-04-01 NOTE — [place] FL2 (Signed)
 [place]  MEDICAID FL2 LEVEL OF CARE FORM     IDENTIFICATION  Patient Name: Colton Nguyen Birthdate: 04-01-1946 Sex: male Admission Date (Current Location): 03/30/2024  [facility] and [place] Number:  Chiropodist and Address:  [facility], [address] [place], [place] [address]      Provider Number: [id number]  Attending Physician Name and Address:  Leesa Kast, DO  Relative Name and Phone Number:  Towana Abelson (Sister)  [phone]    Current Level of Care: Hospital Recommended Level of Care: Skilled Nursing Facility Prior Approval Number:    Date Approved/Denied:   PASRR Number: [id number]  Discharge Plan: SNF    Current Diagnoses: Patient Active Problem List   Diagnosis Date Noted   Protein-calorie malnutrition, severe 04/01/2024   COPD exacerbation (HCC) 03/30/2024   Prediabetes 01/30/2023   Chronic respiratory failure with hypoxia, on home O2 therapy (HCC) 01/28/2023   Iron deficiency anemia, unspecified 12/21/2019   Drug induced constipation 12/21/2019   Anxiety 12/21/2019   Tobacco use disorder 12/21/2019   Cognitive communication deficit 12/21/2019   Lumbar stenosis with neurogenic claudication 11/14/2019   BPH (benign prostatic hyperplasia) 08/01/2018   Hypertension 12/28/2016   COPD (chronic obstructive pulmonary disease) (HCC) 12/28/2016   Encounter for antineoplastic chemotherapy 06/03/2015   Smoking trying to quit 04/01/2015   Squamous cell carcinoma of lung, stage III 03/07/2015   spiculated right upper lobe mass 02/14/2015    Orientation RESPIRATION BLADDER Height & Weight     Self, Time, Situation  O2 Incontinent, Continent (occassional) Weight: 62.7 kg Height:  5' 8 (172.7 cm)  BEHAVIORAL SYMPTOMS/MOOD NEUROLOGICAL BOWEL NUTRITION STATUS      Continent Diet  AMBULATORY STATUS COMMUNICATION OF NEEDS Skin   Limited Assist Verbally Normal                       Personal Care Assistance  Level of Assistance  Bathing, Feeding, Dressing Bathing Assistance: Limited assistance Feeding assistance: Independent Dressing Assistance: Limited assistance     Functional Limitations Info  Sight, Hearing, Speech Sight Info: Adequate Hearing Info: Adequate Speech Info: Adequate    SPECIAL CARE FACTORS FREQUENCY                       Contractures      Additional Factors Info  Code Status, Allergies Code Status Info: Full code Allergies Info: No known Allergies           Current Medications (04/01/2024):  This is the current hospital active medication list Current Facility-Administered Medications  Medication Dose Route Frequency Provider Last Rate Last Admin   acetaminophen  (TYLENOL ) tablet 650 mg  650 mg Oral Q4H PRN Laurita Manor T, MD       albuterol  (PROVENTIL ) (2.5 MG/3ML) 0.083% nebulizer solution 2.5 mg  2.5 mg Inhalation Q6H PRN Laurita Manor DASEN, MD       aspirin  EC tablet 81 mg  81 mg Oral Daily Laurita Manor T, MD   81 mg at 04/01/24 9082   atorvastatin  (LIPITOR) tablet 40 mg  40 mg Oral Daily Zhang, Ping T, MD   40 mg at 04/01/24 9082   azelastine  (ASTELIN ) 0.1 % nasal spray 1 spray  1 spray Each Nare BID Laurita Manor T, MD   1 spray at 04/01/24 0921   [START ON 04/02/2024] azithromycin  (ZITHROMAX ) tablet 500 mg  500 mg Oral Daily Dezii, Alexandra, DO       budesonide -glycopyrrolate -formoterol  (BREZTRI ) 160-9-4.8 MCG/ACT  inhaler 2 puff  2 puff Inhalation BID Laurita Manor T, MD   2 puff at 04/01/24 0756   cefTRIAXone  (ROCEPHIN ) 2 g in sodium chloride  0.9 % 100 mL IVPB  2 g Intravenous Q24H Laurita Manor T, MD 200 mL/hr at 04/01/24 0905 2 g at 04/01/24 9094   cyclobenzaprine  (FLEXERIL ) tablet 5 mg  5 mg Oral TID PRN Laurita Manor DASEN, MD       enoxaparin  (LOVENOX ) injection 40 mg  40 mg Subcutaneous Q24H Laurita Manor T, MD   40 mg at 03/31/24 2151   feeding supplement (ENSURE PLUS HIGH PROTEIN) liquid 237 mL  237 mL Oral TID BM Dezii, Alexandra, DO   237 mL at 04/01/24 1419    finasteride  (PROSCAR ) tablet 5 mg  5 mg Oral Daily Laurita Manor T, MD   5 mg at 04/01/24 9082   fluticasone  (FLONASE ) 50 MCG/ACT nasal spray 2 spray  2 spray Each Nare Daily PRN Laurita Manor T, MD       hydrALAZINE  (APRESOLINE ) injection 5 mg  5 mg Intravenous Q6H PRN Laurita Manor T, MD   5 mg at 03/31/24 0319   ipratropium-albuterol  (DUONEB) 0.5-2.5 (3) MG/3ML nebulizer solution 3 mL  3 mL Nebulization BID Dezii, Alexandra, DO   3 mL at 04/01/24 9253   LORazepam  (ATIVAN ) tablet 0.5 mg  0.5 mg Oral Daily PRN Laurita Manor T, MD       losartan  (COZAAR ) tablet 50 mg  50 mg Oral Daily Laurita Manor T, MD   50 mg at 04/01/24 0913   multivitamin with minerals tablet 1 tablet  1 tablet Oral Daily Dezii, Alexandra, DO   1 tablet at 04/01/24 0913   nicotine  (NICODERM CQ  - dosed in mg/24 hours) patch 21 mg  21 mg Transdermal Daily Zhang, Ping T, MD   21 mg at 04/01/24 0912   ondansetron  (ZOFRAN ) injection 4 mg  4 mg Intravenous Q6H PRN Laurita Manor T, MD       predniSONE  (DELTASONE ) tablet 40 mg  40 mg Oral Q breakfast Laurita Manor T, MD   40 mg at 04/01/24 0756   sodium chloride  flush (NS) 0.9 % injection 3 mL  3 mL Intravenous Q12H Laurita Manor T, MD   3 mL at 04/01/24 9083   sodium chloride  flush (NS) 0.9 % injection 3 mL  3 mL Intravenous PRN Laurita Manor T, MD       tamsulosin  (FLOMAX ) capsule 0.4 mg  0.4 mg Oral Daily Laurita Manor T, MD   0.4 mg at 04/01/24 9083   traMADol  (ULTRAM ) tablet 50 mg  50 mg Oral Q12H PRN Laurita Manor DASEN, MD         Discharge Medications: Please see discharge summary for a list of discharge medications.  Relevant Imaging Results:  Relevant Lab Results:   Additional Information SSN [id number]  Delphine KANDICE Bring, RN

## 2024-04-01 NOTE — Progress Notes (Signed)
 Physical Therapy Treatment Patient Details Name: Colton Nguyen MRN: [id number] DOB: [birth year] Today's Date: 04/01/2024   History of Present Illness 78 y.o. male past medical history significant for COPD on a baseline home 2 L of oxygen , CAD, thyroid disorder, hypertension, hyperlipidemia presents to the emergency department after multiple falls.  Patient had multiple episodes of passing out over the past couple of days according to his sister.  She states that he has been more confused and having memory issues that has been worsening over the past week.    PT Comments  Pt is making improved progress towards goals with decreased physical assistance required for mobility. Received seated in chair on 6L of O2 with sats at 99%. Reduced to 4L for trial of ambulation. Delayed responses with command following. Pt very pleasant and agreeable. Slow gait speed with min assist for turns/directions/attention to task. Pt remains at risk for falls, chair alarm activated once returned to recliner. Asked to be positioned towards window. All mobility on 4L with sats at 97%. Will continue to progress as pt is not quite at baseline level.    If plan is discharge home, recommend the following: A little help with walking and/or transfers;A little help with bathing/dressing/bathroom;Assist for transportation;Help with stairs or ramp for entrance   Can travel by private vehicle     Yes  Equipment Recommendations  None recommended by PT    Recommendations for Other Services       Precautions / Restrictions Precautions Precautions: Fall Recall of Precautions/Restrictions: Intact Restrictions Weight Bearing Restrictions Per Provider Order: No     Mobility  Bed Mobility               General bed mobility comments: NT, received in recliner    Transfers Overall transfer level: Needs assistance Equipment used: Rolling walker (2 wheels) Transfers: Sit to/from Stand, Bed to  chair/wheelchair/BSC Sit to Stand: Min assist           General transfer comment: needs cues for sequencing. RW used    Ambulation/Gait Ambulation/Gait assistance: Min Chemical engineer (Feet): 65 Feet Assistive device: Rolling walker (2 wheels) Gait Pattern/deviations: Step-to pattern       General Gait Details: slow and effortful with increased WOB with exertion. RW used and pt on 4L of O2 (2L baseline). O2 sats at 96% with exertion   Stairs             Wheelchair Mobility     Tilt Bed    Modified Rankin (Stroke Patients Only)       Balance Overall balance assessment: Needs assistance Sitting-balance support: No upper extremity supported, Feet supported Sitting balance-Leahy Scale: Good     Standing balance support: During functional activity, Single extremity supported Standing balance-Leahy Scale: Poor                              Communication Communication Communication: No apparent difficulties  Cognition Arousal: Alert Behavior During Therapy: WFL for tasks assessed/performed, Impulsive   PT - Cognitive impairments: No family/caregiver present to determine baseline                       PT - Cognition Comments: alert and oriented, however delayed in responses. Limited carryover and awareness of situation Following commands: Intact      Cueing Cueing Techniques: Verbal cues  Exercises      General Comments  Pertinent Vitals/Pain Pain Assessment Pain Assessment: No/denies pain    Home Living                          Prior Function            PT Goals (current goals can now be found in the care plan section) Acute Rehab PT Goals Patient Stated Goal: sit near the sunlight PT Goal Formulation: With patient Time For Goal Achievement: 04/13/24 Potential to Achieve Goals: Good Progress towards PT goals: Progressing toward goals    Frequency    Min 2X/week      PT Plan       Co-evaluation              AM-PAC PT 6 Clicks Mobility   Outcome Measure  Help needed turning from your back to your side while in a flat bed without using bedrails?: None Help needed moving from lying on your back to sitting on the side of a flat bed without using bedrails?: A Little Help needed moving to and from a bed to a chair (including a wheelchair)?: A Little Help needed standing up from a chair using your arms (e.g., wheelchair or bedside chair)?: A Little Help needed to walk in hospital room?: A Little Help needed climbing 3-5 steps with a railing? : A Lot 6 Click Score: 18    End of Session Equipment Utilized During Treatment: Gait belt;Oxygen  Activity Tolerance: Patient tolerated treatment well Patient left: in chair;with chair alarm set Nurse Communication: Mobility status PT Visit Diagnosis: Muscle weakness (generalized) (M62.81);Difficulty in walking, not elsewhere classified (R26.2)     Time: 8569-8557 PT Time Calculation (min) (ACUTE ONLY): 12 min  Charges:    $Gait Training: 8-22 mins PT General Charges $$ ACUTE PT VISIT: 1 Visit                     Corean Dade, PT, DPT, GCS [phone]    Rockelle Heuerman 04/01/2024, 3:03 PM

## 2024-04-01 NOTE — Plan of Care (Signed)

## 2024-04-01 NOTE — TOC Progression Note (Signed)
 Transition of Care [facility]) - Progression Note    Patient Details  Name: Colton Nguyen MRN: [id number] Date of Birth: 11-02-1945  Transition of Care [facility]) CM/SW Contact  Delphine KANDICE Bring, RN Phone Number: 04/01/2024, 4:51 PM  Clinical Narrative:    Patient sitting up in chair. No family at bedside. CM spoke with patient about therapy's recommendation for SNF. CM explained SNF. Patient want to go home. CM asked who will assist him at home. Patient states his son and his sister, [name] Arts.. CM asked if CM can call his sister. Patient called sister on his phone. CM informed her of therapy recommendation for SNF and patient's desire to go home w/ HH. Sister states that his son is autix with limited ability to assist him and she states that she can't help much.  CM  reviewed list of SNF from Medicare.gov website. Patient is in agreement with SNF. His choices are: 1. [facility] 3. [facility]( Patient has been there in the past). Cm left the SNF list in his room.     Expected Discharge Plan: Skilled Nursing Facility                 Expected Discharge Plan and Services                                               Social Drivers of Health (SDOH) Interventions SDOH Screenings   Transportation Needs: No Transportation Needs (01/30/2023)  Utilities: Not At Risk (01/30/2023)  Depression (PHQ2-9): Low Risk  (01/30/2023)  Financial Resource Strain: Low Risk  (01/30/2023)  Social Connections: Unknown (01/20/2022)   Received from [facility]  Tobacco Use: High Risk (03/30/2024)    Readmission Risk Interventions     No data to display

## 2024-04-02 DIAGNOSIS — J441 Chronic obstructive pulmonary disease with (acute) exacerbation: Secondary | ICD-10-CM | POA: Diagnosis not present

## 2024-04-02 DIAGNOSIS — E43 Unspecified severe protein-calorie malnutrition: Secondary | ICD-10-CM

## 2024-04-02 LAB — COMPREHENSIVE METABOLIC PANEL WITH GFR
ALT: 16 U/L (ref 0–44)
AST: 24 U/L (ref 15–41)
Albumin: 2.6 g/dL — ABNORMAL LOW (ref 3.5–5.0)
Alkaline Phosphatase: 52 U/L (ref 38–126)
Anion gap: 7 (ref 5–15)
BUN: 32 mg/dL — ABNORMAL HIGH (ref 8–23)
CO2: 35 mmol/L — ABNORMAL HIGH (ref 22–32)
Calcium: 8.3 mg/dL — ABNORMAL LOW (ref 8.9–10.3)
Chloride: 95 mmol/L — ABNORMAL LOW (ref 98–111)
Creatinine, Ser: 0.98 mg/dL (ref 0.61–1.24)
GFR, Estimated: >60 mL/min (ref >60)
Glucose, Bld: 97 mg/dL (ref 70–99)
Potassium: 3.9 mmol/L (ref 3.5–5.1)
Sodium: 137 mmol/L (ref 135–145)
Total Bilirubin: 0.4 mg/dL (ref 0.0–1.2)
Total Protein: 5 g/dL — ABNORMAL LOW (ref 6.5–8.1)

## 2024-04-02 LAB — CBC WITH DIFFERENTIAL/PLATELET
Abs Immature Granulocytes: 0.03 K/uL (ref 0.00–0.07)
Basophils Absolute: 0 K/uL (ref 0.0–0.1)
Basophils Relative: 0 %
Eosinophils Absolute: 0 K/uL (ref 0.0–0.5)
Eosinophils Relative: 0 %
HCT: 32.1 % — ABNORMAL LOW (ref 39.0–52.0)
Hemoglobin: 10.2 g/dL — ABNORMAL LOW (ref 13.0–17.0)
Immature Granulocytes: 0 %
Lymphocytes Relative: 13 %
Lymphs Abs: 1.1 K/uL (ref 0.7–4.0)
MCH: 24.2 pg — ABNORMAL LOW (ref 26.0–34.0)
MCHC: 31.8 g/dL (ref 30.0–36.0)
MCV: 76.1 fL — ABNORMAL LOW (ref 80.0–100.0)
Monocytes Absolute: 0.7 K/uL (ref 0.1–1.0)
Monocytes Relative: 9 %
Neutro Abs: 6.5 K/uL (ref 1.7–7.7)
Neutrophils Relative %: 78 %
Platelets: 210 K/uL (ref 150–400)
RBC: 4.22 MIL/uL (ref 4.22–5.81)
RDW: 18.3 % — ABNORMAL HIGH (ref 11.5–15.5)
WBC: 8.3 K/uL (ref 4.0–10.5)
nRBC: 0 % (ref 0.0–0.2)

## 2024-04-02 NOTE — Plan of Care (Signed)

## 2024-04-02 NOTE — Progress Notes (Signed)
 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  On arrival to ED patient required BiPAP to maintain O2 sats.  CTA negative for PE but was concerning for right lower lobe pneumonia.  Head CT negative for acute findings.  VBG 7.3 2/90/31.  He was admitted for treatment of COPD exacerbation, and pneumonia.  Subjective: No events overnight.  Patient is continuing to do well this morning.  We are weaning his oxygen .  He has no acute complaints.  Objective: Vitals:   04/02/24 0727 04/02/24 0747 04/02/24 1000 04/02/24 1122  BP:  (!) 125/57  110/60  Pulse:  89  94  Resp:  19  (!) 21  Temp:  98.1 F (36.7 C)  98.5 F (36.9 C)  TempSrc:      SpO2: 94% 100% 94% 98%  Weight:      Height:        Intake/Output Summary (Last 24 hours) at 04/02/2024 1249 Last data filed at 04/02/2024 0900 Gross per 24 hour  Intake 943 ml  Output 1075 ml  Net -132 ml   Filed Weights   03/31/24 1755 04/01/24 0501 04/02/24 0500  Weight: 62.9 kg 62.7 kg 62.8 kg    Examination: General exam: Appears calm and comfortable, NAD  Respiratory system: No work of breathing, 2 L O2 in place.  Poor aeration bilaterally Cardiovascular system: S1 & S2 heard, RRR.  Gastrointestinal system: Abdomen is nondistended, soft and nontender.  Neuro: Alert and oriented. No focal neurological deficits. Extremities: Symmetric, expected ROM Skin: No rashes, lesions Psychiatry: Mood & affect appropriate for situation.   Assessment & Plan:  Principal  Problem:   COPD exacerbation (HCC) Active Problems:   COPD (chronic obstructive pulmonary disease) (HCC)   Protein-calorie malnutrition, severe    Acute on chronic hypoxic and hypercapnic respiratory failure Acute COPD exacerbation RLL CAP, bacteria unknown - VBG with improved CO2.  Patient is tapered off of CPAP now. - Continue with supplemental O2, wean as tolerated.  At baseline patient requires 2 L.  Suspect he is nearing that now - Continue ceftriaxone  and azithromycin . - Continue with steroids, prednisone  taper. - Continue DuoNebs - Encourage incentive parameter and flutter valve  Generalized weakness Chronic bilateral low back pain with bilateral psych attic, primary osteoarthritis involving multiple joints - Patient endorses generalized weakness which has been progressive over the last many months.  He recently saw PCP who arranged for home health, wheelchair, lift chair - Per physical therapy evaluations earlier this admission patient will likely require SNF at discharge.  TOC has been consulted to help in these arrangements.  CHF, ruled out - Patient carries diagnosis of heart failure on chart review though I cannot find prior echocardiogram for review - During this admission repeat echocardiogram reveals preserved EF with indeterminate diastolic parameters and no significant valvular disease - Patient was initially IV Lasix .  This has been discontinued - Will follow I's and O's though bedside RN reports these are not accurate as patient is having significant difficulty getting urine in bedside urinal or keeping on condom cath -  Clinically patient appears euvolemic.  Primary cancer right upper lobe of the lung - Prior stage IIIa non-small cell lung cancer. has been on observation since 2016 - Follows outpatient with oncology  Near syncope - Likely secondary to hypoxic and hypercapnic respiratory failure as above - Continue monitor on telemetry, no acute events. - Echo  as above - Orthostatics negative - PT/OT, will need SNF  Moderate protein calorie malnutrition Unintentional weight loss - Questionable dysphagia - SLP consult reports no concerns of dysphagia.  Patient is tolerating regular diet.  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  Cessation counseling  Medication nonadherence - Complicating all of the above.  Patient has been counseled on taking all medications as prescribed.  He will be provided a new medication list at discharge, we will ensure all medications are affordable  Mixed hyperlipidemia -- cont statin  Prediabetes - Continue outpatient follow-up with PCP  Hypokalemia -- replace as needed. --Trend CMP  DVT prophylaxis: Lovenox    Code Status: Full Code Disposition: Nearing medical readiness.  Needs SNF  Consultants:    Procedures:    Antimicrobials:  Anti-infectives (From admission, onward)    Start     Dose/Rate Route Frequency Ordered Stop   04/02/24 1000  azithromycin  (ZITHROMAX ) tablet 500 mg        500 mg Oral Daily 04/01/24 1243     03/31/24 1000  azithromycin  (ZITHROMAX ) 500 mg in sodium chloride  0.9 % 250 mL IVPB  Status:  Discontinued        500 mg 250 mL/hr over 60 Minutes Intravenous Every 24 hours 03/30/24 0844 04/01/24 1242   03/30/24 1000  cefTRIAXone  (ROCEPHIN ) 2 g in sodium chloride  0.9 % 100 mL IVPB        2 g 200 mL/hr over 30 Minutes Intravenous Every 24 hours 03/30/24 0844     03/30/24 0615  ceFEPIme  (MAXIPIME ) 2 g in sodium chloride  0.9 % 100 mL IVPB        2 g 200 mL/hr over 30 Minutes Intravenous  Once 03/30/24 0614 03/30/24 0752   03/30/24 0615  azithromycin  (ZITHROMAX ) 500 mg in sodium chloride  0.9 % 250 mL IVPB        500 mg 250 mL/hr over 60 Minutes Intravenous  Once 03/30/24 9385 03/30/24 0915       Data Reviewed: I have personally reviewed following  labs and imaging studies CBC: Recent Labs  Lab 03/30/24 0510 04/02/24 0844  WBC 7.6 8.3  NEUTROABS 5.8 6.5  HGB 10.4* 10.2*  HCT 34.7* 32.1*  MCV 80.0 76.1*  PLT 215 210   Basic Metabolic Panel: Recent Labs  Lab 03/30/24 0245 03/31/24 0423 04/01/24 0228 04/02/24 0844  NA 138 140 137 137  K 4.3 3.7 3.4* 3.9  CL 93* 91* 90* 95*  CO2 34* 40* 39* 35*  GLUCOSE 95 128* 89 97  BUN 17 21 33* 32*  CREATININE 0.89 0.92 0.95 0.98  CALCIUM  8.5* 8.7* 9.0 8.3*  MG  --   --  1.9  --   PHOS  --   --  2.7  --    GFR: Estimated Creatinine Clearance: 56.1 mL/min (by C-G formula based on SCr of 0.98 mg/dL). Liver Function Tests: Recent Labs  Lab 03/30/24 0245 04/02/24 0844  AST 26 24  ALT 18 16  ALKPHOS 67 52  BILITOT 0.6 0.4  PROT 6.1* 5.0*  ALBUMIN  3.0*  2.6*   CBG: No results for input(s): GLUCAP in the last 168 hours.  Recent Results (from the past 240 hours)  Blood culture (routine x 2)     Status: None (Preliminary result)   Collection Time: 03/30/24  7:04 AM   Specimen: BLOOD LEFT ARM  Result Value Ref Range Status   Specimen Description BLOOD LEFT ARM  Final   Special Requests   Final    BOTTLES DRAWN AEROBIC AND ANAEROBIC Blood Culture adequate volume   Culture   Final    NO GROWTH 3 DAYS Performed at [facility], [address] [place], [place] [address]    Report Status PENDING  Incomplete  Blood culture (routine x 2)     Status: None (Preliminary result)   Collection Time: 03/30/24 11:26 PM   Specimen: BLOOD  Result Value Ref Range Status   Specimen Description BLOOD BLOOD RIGHT ARM  Final   Special Requests   Final    BOTTLES DRAWN AEROBIC AND ANAEROBIC Blood Culture adequate volume   Culture   Final    NO GROWTH 3 DAYS Performed at [facility], [address], [place], [place] [address]    Report Status PENDING  Incomplete     Radiology Studies: No results found.   Scheduled Meds:  aspirin  EC  81 mg Oral Daily    atorvastatin   40 mg Oral Daily   azelastine   1 spray Each Nare BID   azithromycin   500 mg Oral Daily   budesonide -glycopyrrolate -formoterol   2 puff Inhalation BID   enoxaparin  (LOVENOX ) injection  40 mg Subcutaneous Q24H   feeding supplement  237 mL Oral TID BM   finasteride   5 mg Oral Daily   ipratropium-albuterol   3 mL Nebulization BID   losartan   50 mg Oral Daily   multivitamin with minerals  1 tablet Oral Daily   nicotine   21 mg Transdermal Daily   predniSONE   40 mg Oral Q breakfast   sodium chloride  flush  3 mL Intravenous Q12H   tamsulosin   0.4 mg Oral Daily   Continuous Infusions:  cefTRIAXone  (ROCEPHIN )  IV 2 g (04/02/24 1002)     LOS: 3 days  MDM: Patient is high risk for one or more organ failure.  They necessitate ongoing hospitalization for continued IV therapies and subsequent lab monitoring. Total time spent interpreting labs and vitals, reviewing the medical record, coordinating care amongst consultants and care team members, directly assessing and discussing care with the patient and/or family: 55 min  Taite Schoeppner, DO [facility]  To contact the attending physician between 7A-7P please use Epic Chat. To contact the covering physician during after hours 7P-7A, please review Amion.  04/02/2024, 12:49 PM   *This document has been created with the assistance of dictation software. Please excuse typographical errors. *

## 2024-04-03 DIAGNOSIS — J441 Chronic obstructive pulmonary disease with (acute) exacerbation: Secondary | ICD-10-CM | POA: Diagnosis not present

## 2024-04-03 LAB — CBC WITH DIFFERENTIAL/PLATELET
Abs Immature Granulocytes: 0.02 K/uL (ref 0.00–0.07)
Basophils Absolute: 0 K/uL (ref 0.0–0.1)
Basophils Relative: 0 %
Eosinophils Absolute: 0 K/uL (ref 0.0–0.5)
Eosinophils Relative: 0 %
HCT: 31.8 % — ABNORMAL LOW (ref 39.0–52.0)
Hemoglobin: 9.7 g/dL — ABNORMAL LOW (ref 13.0–17.0)
Immature Granulocytes: 0 %
Lymphocytes Relative: 16 %
Lymphs Abs: 1.2 K/uL (ref 0.7–4.0)
MCH: 23.4 pg — ABNORMAL LOW (ref 26.0–34.0)
MCHC: 30.5 g/dL (ref 30.0–36.0)
MCV: 76.8 fL — ABNORMAL LOW (ref 80.0–100.0)
Monocytes Absolute: 0.7 K/uL (ref 0.1–1.0)
Monocytes Relative: 9 %
Neutro Abs: 5.5 K/uL (ref 1.7–7.7)
Neutrophils Relative %: 75 %
Platelets: 210 K/uL (ref 150–400)
RBC: 4.14 MIL/uL — ABNORMAL LOW (ref 4.22–5.81)
RDW: 18.4 % — ABNORMAL HIGH (ref 11.5–15.5)
WBC: 7.4 K/uL (ref 4.0–10.5)
nRBC: 0 % (ref 0.0–0.2)

## 2024-04-03 LAB — COMPREHENSIVE METABOLIC PANEL WITH GFR
ALT: 14 U/L (ref 0–44)
AST: 18 U/L (ref 15–41)
Albumin: 2.6 g/dL — ABNORMAL LOW (ref 3.5–5.0)
Alkaline Phosphatase: 49 U/L (ref 38–126)
Anion gap: 10 (ref 5–15)
BUN: 41 mg/dL — ABNORMAL HIGH (ref 8–23)
CO2: 35 mmol/L — ABNORMAL HIGH (ref 22–32)
Calcium: 8.6 mg/dL — ABNORMAL LOW (ref 8.9–10.3)
Chloride: 97 mmol/L — ABNORMAL LOW (ref 98–111)
Creatinine, Ser: 0.89 mg/dL (ref 0.61–1.24)
GFR, Estimated: >60 mL/min (ref >60)
Glucose, Bld: 99 mg/dL (ref 70–99)
Potassium: 3.8 mmol/L (ref 3.5–5.1)
Sodium: 142 mmol/L (ref 135–145)
Total Bilirubin: 0.6 mg/dL (ref 0.0–1.2)
Total Protein: 5.1 g/dL — ABNORMAL LOW (ref 6.5–8.1)

## 2024-04-03 LAB — MAGNESIUM: Magnesium: 2 mg/dL (ref 1.7–2.4)

## 2024-04-03 LAB — PHOSPHORUS: Phosphorus: 4 mg/dL (ref 2.5–4.6)

## 2024-04-03 NOTE — Progress Notes (Signed)
 Occupational Therapy Treatment Patient Details Name: Colton Nguyen MRN: [id number] DOB: 1945-10-20 Today's Date: 04/03/2024   History of present illness 78 y.o. male past medical history significant for COPD on a baseline home 2 L of oxygen , CAD, thyroid disorder, hypertension, hyperlipidemia presents to the emergency department after multiple falls.  Patient had multiple episodes of passing out over the past couple of days according to his sister.  She states that he has been more confused and having memory issues that has been worsening over the past week.   OT comments  Pt seen for OT tx this date. Pt found to have large amounts of BM in bed, requiring full bed change with pt generally unaware of BM. Pt performs bed mobility with supervision, CGA for multiple STS transfers using RW from various surface levels, MAX A for pericare + clothing mgmt. Pt on 2L/min [place] throughout. Pt left in recliner, needs in reach, RN aware of need for new male purewick. Discharge recommendation appropriate, OT will continue to follow.       If plan is discharge home, recommend the following:  A little help with walking and/or transfers;A lot of help with bathing/dressing/bathroom;Assistance with cooking/housework;Assist for transportation;Help with stairs or ramp for entrance   Equipment Recommendations  Other (comment)       Precautions / Restrictions Precautions Precautions: Fall Recall of Precautions/Restrictions: Intact Restrictions Weight Bearing Restrictions Per Provider Order: No       Mobility Bed Mobility Overal bed mobility: Needs Assistance Bed Mobility: Supine to Sit     Supine to sit: Contact guard          Transfers Overall transfer level: Needs assistance Equipment used: Rolling walker (2 wheels) Transfers: Sit to/from Stand, Bed to chair/wheelchair/BSC Sit to Stand: Contact guard assist     Step pivot transfers: Contact guard assist     General transfer comment:  improved mobility from previous sessions     Balance Overall balance assessment: Needs assistance Sitting-balance support: No upper extremity supported, Feet supported Sitting balance-Leahy Scale: Good     Standing balance support: During functional activity, Single extremity supported Standing balance-Leahy Scale: Fair Standing balance comment: able to maintain balance with UE support on RW for pericare                           ADL either performed or assessed with clinical judgement   ADL Overall ADL's : Needs assistance/impaired                             Toileting- Clothing Manipulation and Hygiene: Maximal assistance;Sit to/from stand Toileting - Clothing Manipulation Details (indicate cue type and reason): assistance for clothing management and hygiene     Functional mobility during ADLs: Contact guard assist General ADL Comments: pt completing multiple bouts of STS with RW after episode of BM incontinence. pt requires CGA for functional transfers, is able to ambulate short distance in room to recliner with CGA     Communication Communication Communication: No apparent difficulties   Cognition Arousal: Alert Behavior During Therapy: WFL for tasks assessed/performed, Impulsive               OT - Cognition Comments: pt found to be incontinent of large amounts of BM, unaware                 Following commands: Intact        Cueing  Cueing Techniques: Verbal cues        General Comments RN notified of need for new male purewick and bed linens (linens brought in room)    Pertinent Vitals/ Pain       Pain Assessment Pain Assessment: No/denies pain   Frequency  Min 2X/week        Progress Toward Goals  OT Goals(current goals can now be found in the care plan section)  Progress towards OT goals: Progressing toward goals  Acute Rehab OT Goals OT Goal Formulation: With patient/family Time For Goal Achievement:  04/13/24 Potential to Achieve Goals: Fair ADL Goals Pt Will Perform Grooming: with supervision;standing Pt Will Perform Lower Body Dressing: with supervision;sit to/from stand Pt Will Transfer to Toilet: with supervision;ambulating Pt Will Perform Toileting - Clothing Manipulation and hygiene: with supervision;sit to/from stand  Plan         AM-PAC OT 6 Clicks Daily Activity     Outcome Measure   Help from another person eating meals?: None Help from another person taking care of personal grooming?: None Help from another person toileting, which includes using toliet, bedpan, or urinal?: A Lot Help from another person bathing (including washing, rinsing, drying)?: A Lot Help from another person to put on and taking off regular upper body clothing?: A Little Help from another person to put on and taking off regular lower body clothing?: A Lot 6 Click Score: 17    End of Session Equipment Utilized During Treatment: Rolling walker (2 wheels)  OT Visit Diagnosis: Unsteadiness on feet (R26.81);Muscle weakness (generalized) (M62.81)   Activity Tolerance Patient tolerated treatment well   Patient Left in chair;with chair alarm set;with call bell/phone within reach   Nurse Communication Mobility status        Time: 8386-8357 OT Time Calculation (min): 29 min  Charges: OT General Charges $OT Visit: 1 Visit OT Treatments $Self Care/Home Management : 23-37 mins  Renzo Vincelette L. Avalin Briley, OTR/L  04/03/24, 4:52 PM

## 2024-04-03 NOTE — Plan of Care (Signed)

## 2024-04-03 NOTE — Progress Notes (Signed)
 Heart Failure Navigator Progress Note  Assessed for Heart & Vascular TOC clinic readiness.  Patient does not meet criteria due to CHF ruled out per note.   Navigator available for reassessment of patient if needed.  Charmaine Pines, RN, BSN [facility] Heart Failure Navigator Secure Chat Only

## 2024-04-03 NOTE — Progress Notes (Signed)
 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  On arrival to ED patient required BiPAP to maintain O2 sats.  CTA negative for PE but was concerning for right lower lobe pneumonia.  Head CT negative for acute findings.  VBG 7.3 2/90/31.  He was admitted for treatment of COPD exacerbation, and pneumonia.  Subjective: No acute events overnight.  Patient is doing well this morning.  Has no complaints currently.  He does appear to be dyspneic when speaking but he reports that this is baseline.  Objective: Vitals:   04/03/24 0500 04/03/24 0729 04/03/24 0753 04/03/24 1133  BP:  118/65  131/73  Pulse:  76  99  Resp:  14  16  Temp:  98.3 F (36.8 C)  98.4 F (36.9 C)  TempSrc:      SpO2:  99% 99% 99%  Weight: 65 kg     Height:        Intake/Output Summary (Last 24 hours) at 04/03/2024 1425 Last data filed at 04/03/2024 1300 Gross per 24 hour  Intake 443 ml  Output 600 ml  Net -157 ml   Filed Weights   04/01/24 0501 04/02/24 0500 04/03/24 0500  Weight: 62.7 kg 62.8 kg 65 kg    Examination: General exam: Appears calm and comfortable, NAD  Respiratory system: No work of breathing, 2 L O2 in place.  No wheeze,  Cardiovascular system: S1 & S2 heard, RRR.  Gastrointestinal system: Abdomen is nondistended, soft and nontender.  Neuro: Alert and oriented. No focal neurological deficits. Extremities: Symmetric, expected ROM Skin: No rashes, lesions Psychiatry: Mood & affect appropriate for situation.   Assessment  & Plan:  Principal Problem:   COPD exacerbation (HCC) Active Problems:   COPD (chronic obstructive pulmonary disease) (HCC)   Protein-calorie malnutrition, severe    Acute on chronic hypoxic and hypercapnic respiratory failure Acute COPD exacerbation RLL CAP, bacteria unknown - VBG with improved CO2.  Patient is tapered off of CPAP now. - Continue with supplemental O2, wean as tolerated.  At baseline patient requires 2 L.  -Patient does appear dyspneic when speaking and intermittently tachypneic but he reports that this is consistent with his baseline. - Continue ceftriaxone  and azithromycin . - Continue with prednisone  taper - Continue DuoNebs - Continue incentive spirometer and flutter valve  Generalized weakness Chronic bilateral low back pain with bilateral psych attic, primary osteoarthritis involving multiple joints - Patient endorses generalized weakness which has been progressive over the last many months.  He recently saw PCP who arranged for home health, wheelchair, lift chair - PT has recommended SNF.  Patient and family agree.  TOC consulted  CHF, ruled out - Patient carries diagnosis of heart failure on chart review though I cannot find prior echocardiogram for review - During this admission repeat echocardiogram reveals preserved EF with indeterminate diastolic parameters and no significant valvular disease - Patient was initially IV Lasix .  This has been discontinued - Will follow I's and O's though bedside RN reports these are not accurate as patient is having significant difficulty getting urine in bedside  urinal or keeping on condom cath - Clinically patient appears euvolemic.  Primary cancer right upper lobe of the lung - Prior stage IIIa non-small cell lung cancer. has been on observation since 2016 - Follows outpatient with oncology  Near syncope - Likely secondary to hypoxic and hypercapnic respiratory failure as above - Continue monitor on telemetry, no  acute events. - Echo as above - Orthostatics negative - PT/OT, will need SNF  Moderate protein calorie malnutrition Unintentional weight loss - Questionable dysphagia - SLP consult reports no concerns of dysphagia.  Patient is tolerating regular diet.  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  Cessation counseling  Medication nonadherence - Complicating all of the above.  Patient has been counseled on taking all medications as prescribed.  He will be provided a new medication list at discharge, we will ensure all medications are affordable  Mixed hyperlipidemia -- cont statin  Prediabetes - Continue outpatient follow-up with PCP  Hypokalemia -- replace as needed. --Trend CMP  Forgetfulness -Patient sister endorses patient has become increasingly forgetful outpatient.  She wonders if he has dementia.  Head CT on arrival does demonstrate small vascular ischemic disease - Will refer to neurology at discharge for cognitive testing.  Given patient's comorbidities vascular dementia is a certain possibility.  At this time he is alert and oriented x 3  DVT prophylaxis: Lovenox    Code Status: Full Code Disposition: Nearing medical readiness.  Needs SNF  Consultants:    Procedures:    Antimicrobials:  Anti-infectives (From admission, onward)    Start     Dose/Rate Route Frequency Ordered Stop   04/02/24 1000  azithromycin  (ZITHROMAX ) tablet 500 mg        500 mg Oral Daily 04/01/24 1243     03/31/24 1000  azithromycin  (ZITHROMAX ) 500 mg in sodium chloride  0.9 % 250 mL IVPB  Status:  Discontinued        500 mg 250 mL/hr over 60 Minutes Intravenous Every 24 hours 03/30/24 0844 04/01/24 1242   03/30/24 1000  cefTRIAXone  (ROCEPHIN ) 2 g in sodium chloride  0.9 % 100 mL IVPB        2 g 200 mL/hr over 30 Minutes Intravenous Every 24 hours 03/30/24 0844      03/30/24 0615  ceFEPIme  (MAXIPIME ) 2 g in sodium chloride  0.9 % 100 mL IVPB        2 g 200 mL/hr over 30 Minutes Intravenous  Once 03/30/24 0614 03/30/24 0752   03/30/24 0615  azithromycin  (ZITHROMAX ) 500 mg in sodium chloride  0.9 % 250 mL IVPB        500 mg 250 mL/hr over 60 Minutes Intravenous  Once 03/30/24 9385 03/30/24 0915       Data Reviewed: I have personally reviewed following labs and imaging studies CBC: Recent Labs  Lab 03/30/24 0510 04/02/24 0844 04/03/24 0347  WBC 7.6 8.3 7.4  NEUTROABS 5.8 6.5 5.5  HGB 10.4* 10.2* 9.7*  HCT 34.7* 32.1* 31.8*  MCV 80.0 76.1* 76.8*  PLT 215 210 210   Basic Metabolic Panel: Recent Labs  Lab 03/30/24 0245 03/31/24 0423 04/01/24 0228 04/02/24 0844 04/03/24 0347  NA 138 140 137 137 142  K 4.3 3.7 3.4* 3.9 3.8  CL 93* 91* 90* 95* 97*  CO2 34* 40* 39* 35* 35*  GLUCOSE 95 128* 89 97 99  BUN 17 21 33* 32* 41*  CREATININE 0.89 0.92  0.95 0.98 0.89  CALCIUM  8.5* 8.7* 9.0 8.3* 8.6*  MG  --   --  1.9  --  2.0  PHOS  --   --  2.7  --  4.0   GFR: Estimated Creatinine Clearance: 63.9 mL/min (by C-G formula based on SCr of 0.89 mg/dL). Liver Function Tests: Recent Labs  Lab 03/30/24 0245 04/02/24 0844 04/03/24 0347  AST 26 24 18   ALT 18 16 14   ALKPHOS 67 52 49  BILITOT 0.6 0.4 0.6  PROT 6.1* 5.0* 5.1*  ALBUMIN  3.0* 2.6* 2.6*   CBG: No results for input(s): GLUCAP in the last 168 hours.  Recent Results (from the past 240 hours)  Blood culture (routine x 2)     Status: None (Preliminary result)   Collection Time: 03/30/24  7:04 AM   Specimen: BLOOD LEFT ARM  Result Value Ref Range Status   Specimen Description BLOOD LEFT ARM  Final   Special Requests   Final    BOTTLES DRAWN AEROBIC AND ANAEROBIC Blood Culture adequate volume   Culture   Final    NO GROWTH 4 DAYS Performed at [facility], [address] [place], [place] [address]    Report Status PENDING  Incomplete  Blood culture (routine x 2)      Status: None (Preliminary result)   Collection Time: 03/30/24 11:26 PM   Specimen: BLOOD  Result Value Ref Range Status   Specimen Description BLOOD BLOOD RIGHT ARM  Final   Special Requests   Final    BOTTLES DRAWN AEROBIC AND ANAEROBIC Blood Culture adequate volume   Culture   Final    NO GROWTH 4 DAYS Performed at [facility], [address] [place], [place] [address]    Report Status PENDING  Incomplete     Radiology Studies: No results found.   Scheduled Meds:  aspirin  EC  81 mg Oral Daily   atorvastatin   40 mg Oral Daily   azelastine   1 spray Each Nare BID   azithromycin   500 mg Oral Daily   budesonide -glycopyrrolate -formoterol   2 puff Inhalation BID   enoxaparin  (LOVENOX ) injection  40 mg Subcutaneous Q24H   feeding supplement  237 mL Oral TID BM   finasteride   5 mg Oral Daily   ipratropium-albuterol   3 mL Nebulization BID   losartan   50 mg Oral Daily   multivitamin with minerals  1 tablet Oral Daily   nicotine   21 mg Transdermal Daily   sodium chloride  flush  3 mL Intravenous Q12H   tamsulosin   0.4 mg Oral Daily   Continuous Infusions:  cefTRIAXone  (ROCEPHIN )  IV 2 g (04/03/24 0910)     LOS: 4 days  MDM: Patient is high risk for one or more organ failure.  They necessitate ongoing hospitalization for continued IV therapies and subsequent lab monitoring. Total time spent interpreting labs and vitals, reviewing the medical record, coordinating care amongst consultants and care team members, directly assessing and discussing care with the patient and/or family: 55 min  Eastyn Dattilo, DO [facility]  To contact the attending physician between 7A-7P please use Epic Chat. To contact the covering physician during after hours 7P-7A, please review Amion.  04/03/2024, 2:25 PM   *This document has been created with the assistance of dictation software. Please excuse typographical errors. *

## 2024-04-04 DIAGNOSIS — E43 Unspecified severe protein-calorie malnutrition: Secondary | ICD-10-CM | POA: Diagnosis not present

## 2024-04-04 DIAGNOSIS — J441 Chronic obstructive pulmonary disease with (acute) exacerbation: Secondary | ICD-10-CM | POA: Diagnosis not present

## 2024-04-04 LAB — CULTURE, BLOOD (ROUTINE X 2)
Culture: NO GROWTH
Culture: NO GROWTH
Special Requests: ADEQUATE
Special Requests: ADEQUATE

## 2024-04-04 LAB — COMPREHENSIVE METABOLIC PANEL WITH GFR
ALT: 16 U/L (ref 0–44)
AST: 16 U/L (ref 15–41)
Albumin: 2.7 g/dL — ABNORMAL LOW (ref 3.5–5.0)
Alkaline Phosphatase: 50 U/L (ref 38–126)
Anion gap: 7 (ref 5–15)
BUN: 39 mg/dL — ABNORMAL HIGH (ref 8–23)
CO2: 33 mmol/L — ABNORMAL HIGH (ref 22–32)
Calcium: 8.5 mg/dL — ABNORMAL LOW (ref 8.9–10.3)
Chloride: 100 mmol/L (ref 98–111)
Creatinine, Ser: 0.83 mg/dL (ref 0.61–1.24)
GFR, Estimated: >60 mL/min (ref >60)
Glucose, Bld: 95 mg/dL (ref 70–99)
Potassium: 3.7 mmol/L (ref 3.5–5.1)
Sodium: 140 mmol/L (ref 135–145)
Total Bilirubin: 0.5 mg/dL (ref 0.0–1.2)
Total Protein: 5.5 g/dL — ABNORMAL LOW (ref 6.5–8.1)

## 2024-04-04 LAB — CBC WITH DIFFERENTIAL/PLATELET
Abs Immature Granulocytes: 0.03 K/uL (ref 0.00–0.07)
Basophils Absolute: 0 K/uL (ref 0.0–0.1)
Basophils Relative: 0 %
Eosinophils Absolute: 0 K/uL (ref 0.0–0.5)
Eosinophils Relative: 0 %
HCT: 30.6 % — ABNORMAL LOW (ref 39.0–52.0)
Hemoglobin: 9.6 g/dL — ABNORMAL LOW (ref 13.0–17.0)
Immature Granulocytes: 0 %
Lymphocytes Relative: 17 %
Lymphs Abs: 1.2 K/uL (ref 0.7–4.0)
MCH: 23.9 pg — ABNORMAL LOW (ref 26.0–34.0)
MCHC: 31.4 g/dL (ref 30.0–36.0)
MCV: 76.1 fL — ABNORMAL LOW (ref 80.0–100.0)
Monocytes Absolute: 0.7 K/uL (ref 0.1–1.0)
Monocytes Relative: 9 %
Neutro Abs: 5.5 K/uL (ref 1.7–7.7)
Neutrophils Relative %: 74 %
Platelets: 186 K/uL (ref 150–400)
RBC: 4.02 MIL/uL — ABNORMAL LOW (ref 4.22–5.81)
RDW: 18.4 % — ABNORMAL HIGH (ref 11.5–15.5)
WBC: 7.4 K/uL (ref 4.0–10.5)
nRBC: 0 % (ref 0.0–0.2)

## 2024-04-04 LAB — PHOSPHORUS: Phosphorus: 3.3 mg/dL (ref 2.5–4.6)

## 2024-04-04 LAB — MAGNESIUM: Magnesium: 2 mg/dL (ref 1.7–2.4)

## 2024-04-04 MED ORDER — AMOXICILLIN-POT CLAVULANATE 875-125 MG PO TABS: 1.0000 | ORAL_TABLET | Freq: Two times a day (BID) | ORAL | Status: AC

## 2024-04-04 MED ORDER — LOSARTAN POTASSIUM 50 MG PO TABS: 50.0000 mg | ORAL_TABLET | Freq: Every day | ORAL | Status: DC

## 2024-04-04 NOTE — TOC Progression Note (Signed)
 Transition of Care [facility]) - Progression Note    Patient Details  Name: Colton Nguyen MRN: [id number] Date of Birth: 02-10-1946  Transition [facility] CM/SW Contact  Tomasa JAYSON Childes, RN Phone Number: 04/04/2024, 10:37 AM  Clinical Narrative:    Beatris with Deanna at [facility].  She is checking to see if patient can be admitted today.    Expected Discharge Plan: Skilled Nursing Facility                 Expected Discharge Plan and Services                                               Social Drivers of Health (SDOH) Interventions SDOH Screenings   Transportation Needs: No Transportation Needs (01/30/2023)  Utilities: Not At Risk (01/30/2023)  Depression (PHQ2-9): Low Risk  (01/30/2023)  Financial Resource Strain: Low Risk  (01/30/2023)  Social Connections: Unknown (01/20/2022)   Received from [facility]  Tobacco Use: High Risk (03/30/2024)    Readmission Risk Interventions     No data to display

## 2024-04-04 NOTE — Progress Notes (Signed)
  Progress Note   Date: 04/03/2024  Patient Name: Colton Nguyen        MRN#: [id number]  Clarification of diagnosis:  Severe malnutrition

## 2024-04-04 NOTE — Progress Notes (Signed)
 Physical Therapy Treatment Patient Details Name: Colton Nguyen MRN: [id number] DOB: 1946/06/30 Today's Date: 04/04/2024   History of Present Illness 78 y.o. male past medical history significant for COPD on a baseline home 2 L of oxygen , CAD, thyroid disorder, hypertension, hyperlipidemia presents to the emergency department after multiple falls.  Patient had multiple episodes of passing out over the past couple of days according to his sister.  She states that he has been more confused and having memory issues that has been worsening over the past week.    PT Comments  Patient is agreeable to PT session. He reports feeling better overall. Mild dyspnea with short distance ambulation. Education on energy conservation techniques. CGA provided for safety with walking. Patient could benefit from rehabilitation < 3 hours/day. He prefers to return home if possible, but would likely need intermittent supervision/assistance for safe transition home. PT will continue to follow.    If plan is discharge home, recommend the following: A little help with walking and/or transfers;A little help with bathing/dressing/bathroom;Assist for transportation;Help with stairs or ramp for entrance   Can travel by private vehicle     Yes  Equipment Recommendations  None recommended by PT    Recommendations for Other Services       Precautions / Restrictions Precautions Precautions: Fall Recall of Precautions/Restrictions: Intact Restrictions Weight Bearing Restrictions Per Provider Order: No     Mobility  Bed Mobility Overal bed mobility: Needs Assistance Bed Mobility: Supine to Sit     Supine to sit: HOB elevated, Supervision     General bed mobility comments: increased time, no physical assistance required    Transfers Overall transfer level: Needs assistance Equipment used: Rolling walker (2 wheels) Transfers: Sit to/from Stand Sit to Stand: Contact guard assist           General  transfer comment: reinforcement of hand placement for safety    Ambulation/Gait Ambulation/Gait assistance: Contact guard assist Gait Distance (Feet): 40 Feet Assistive device: Rolling walker (2 wheels) Gait Pattern/deviations: Step-to pattern, Decreased stride length       General Gait Details: steady with short distance ambulation using rolling walker with mild dyspnea with exertion. cues for energy conservation techniques   Stairs             Wheelchair Mobility     Tilt Bed    Modified Rankin (Stroke Patients Only)       Balance Overall balance assessment: Needs assistance Sitting-balance support: No upper extremity supported, Feet supported Sitting balance-Leahy Scale: Good     Standing balance support: During functional activity, Single extremity supported Standing balance-Leahy Scale: Fair Standing balance comment: relying on rolling walker for support in standing                            Communication Communication Communication: No apparent difficulties  Cognition Arousal: Alert Behavior During Therapy: WFL for tasks assessed/performed, Impulsive   PT - Cognitive impairments: No family/caregiver present to determine baseline                         Following commands: Intact      Cueing Cueing Techniques: Verbal cues  Exercises      General Comments        Pertinent Vitals/Pain Pain Assessment Pain Assessment: No/denies pain    Home Living  Prior Function            PT Goals (current goals can now be found in the care plan section) Acute Rehab PT Goals Patient Stated Goal: to return home PT Goal Formulation: With patient Time For Goal Achievement: 04/13/24 Potential to Achieve Goals: Good Progress towards PT goals: Progressing toward goals    Frequency    Min 2X/week      PT Plan      Co-evaluation              AM-PAC PT 6 Clicks Mobility   Outcome  Measure  Help needed turning from your back to your side while in a flat bed without using bedrails?: None Help needed moving from lying on your back to sitting on the side of a flat bed without using bedrails?: A Little Help needed moving to and from a bed to a chair (including a wheelchair)?: A Little Help needed standing up from a chair using your arms (e.g., wheelchair or bedside chair)?: A Little Help needed to walk in hospital room?: A Little Help needed climbing 3-5 steps with a railing? : A Lot 6 Click Score: 18    End of Session Equipment Utilized During Treatment: Oxygen  Activity Tolerance: Patient tolerated treatment well Patient left: in chair;with chair alarm set;with call bell/phone within reach   PT Visit Diagnosis: Muscle weakness (generalized) (M62.81);Difficulty in walking, not elsewhere classified (R26.2)     Time: 9046-8992 PT Time Calculation (min) (ACUTE ONLY): 14 min  Charges:    $Therapeutic Activity: 8-22 mins PT General Charges $$ ACUTE PT VISIT: 1 Visit                     Randine Essex, PT, MPT    Randine LULLA Essex 04/04/2024, 10:13 AM

## 2024-04-04 NOTE — Progress Notes (Signed)
 Nutrition Follow-up  DOCUMENTATION CODES:   Severe malnutrition in context of chronic illness  INTERVENTION:   -Continue Ensure Plus High Protein po BID, each supplement provides 350 kcal and 20 grams of protein  -Continue Magic cup TID with meals, each supplement provides 290 kcal and 9 grams of protein  -Continue MVI with minerals daily -Continue regular diet   NUTRITION DIAGNOSIS:   Severe Malnutrition related to chronic illness as evidenced by percent weight loss, severe fat depletion, severe muscle depletion.  Ongoing  GOAL:   Patient will meet greater than or equal to 90% of their needs  Progressing   MONITOR:   PO intake, Supplement acceptance, Labs, Weight trends, Skin, I & O's  REASON FOR ASSESSMENT:   Consult Assessment of nutrition requirement/status  ASSESSMENT:   78 y/o male with h/o COPD, HTN, BPH, IDA, anxiety, HLD, TB, Stage IIIA non-small cell lung cancer s/p chemoradiation (squamous cell carcinoma diagnosed in June 2016) and CHF who is admitted with PNA and COPD exacerbation.  7/24- s/p BSE- regular diet with thin liquids  Reviewed I/O's: -200 ml x 24 hours and -764 ml since admission  UOP: 400 ml x 24 hours   Pt sitting up in recliner chair, watching TV at time of visit. Pt pleasant and in good spirits, eager to engage RD in conversation. Pt reports feeling better. He shares that his appetite has improved. He shares that he consumed most of his breakfast, but is unsure what he ate. He estimates he has been eating most of his meals since admission. Noted meal completions 60-100%.   Discussed importance of good meal and supplement intake to promote healing. Pt amenable to continue Ensure supplements. Per pt, he is not fond of them, but will drink them as he knows they are important for his recovery.   Wt has been stable since last visit.   Per TOC notes, pt awaiting insurance authorization for SNF placement.   Medications reviewed and include  lovenox .   Labs reviewed: CBGS: 112.   Diet Order:   Diet Order             Diet regular Room service appropriate? Yes; Fluid consistency: Thin  Diet effective now                   EDUCATION NEEDS:   Education needs have been addressed  Skin:  Skin Assessment: Reviewed RN Assessment  Last BM:  04/01/24 (type 6)  Height:   Ht Readings from Last 1 Encounters:  03/30/24 5' 8 (1.727 m)    Weight:   Wt Readings from Last 1 Encounters:  04/04/24 63.7 kg    Ideal Body Weight:  70 kg  BMI:  Body mass index is 21.35 kg/m.  Estimated Nutritional Needs:   Kcal:  1800-2100kcal/day  Protein:  90-105g/day  Fluid:  1.7-1.9L/day    Margery ORN, RD, LDN, CDCES Registered Dietitian III Certified Diabetes Care and Education Specialist If unable to reach this RD, please use RD Inpatient group chat on secure chat between hours of 8am-4 pm daily

## 2024-04-04 NOTE — Discharge Summary (Signed)
 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  On arrival to ED patient required BiPAP to maintain O2 sats.  CTA negative for PE but was concerning for right lower lobe pneumonia.  Head CT negative for acute findings.  VBG 7.3 2/90/31.  He was admitted for treatment of COPD exacerbation, and pneumonia.  He gradually improved and returned to his baseline O2 requirement.  He was very deconditioned from his stay and we arranged for skilled nursing facility at discharge for continued physical therapy.  He is discharging today directly to skilled nursing.   Acute on chronic hypoxic and hypercapnic respiratory failure Acute COPD exacerbation RLL CAP, bacteria unknown - VBG with improved CO2.  Patient is tapered off of CPAP now. - Continue with supplemental O2, wean as tolerated.  At baseline patient requires 2 L.  -Patient does appear dyspneic when speaking and intermittently tachypneic but he reports that this is consistent with his baseline. - Continue ceftriaxone  and azithromycin .  Augmentin  at discharge for 2 additional days - Status post prednisone  - Continue incentive spirometer and flutter valve   Generalized weakness Chronic bilateral low back pain with bilateral psych attic, primary osteoarthritis involving multiple joints - Patient endorses generalized weakness which has  been progressive over the last many months.  He recently saw PCP who arranged for home health, wheelchair, lift chair - Discharged directly to SNF today.  Eventually home with home health   CHF, ruled out - Patient carries diagnosis of heart failure on chart review though I cannot find prior echocardiogram for review - During this admission repeat echocardiogram reveals preserved EF with indeterminate diastolic parameters and no significant valvular disease - Patient was initially IV Lasix .  This has been discontinued - Clinically patient appears euvolemic.   Primary cancer right upper lobe of the lung - Prior stage IIIa non-small cell lung cancer. has been on observation since 2016 - Follows outpatient with oncology   Near syncope - Likely secondary to hypoxic and hypercapnic respiratory failure as above - Continue monitor on telemetry, no acute events. - Echo as above - Orthostatics negative   Moderate protein calorie malnutrition Unintentional weight loss - Questionable dysphagia - SLP consult reports no concerns of dysphagia.  Patient is tolerating regular diet.   Hypertension - Resume home medications   BPH Urinary incontinence without sensory awareness - Continue home dose Flomax  and Proscar    Tobacco abuse - Nicotine  patch.  Cessation counseling   Medication nonadherence - Complicating all of the above.  Patient has been counseled on taking all medications as prescribed.  He will be provided a new medication list at discharge, we will ensure all medications are affordable   Mixed hyperlipidemia -- cont statin   Prediabetes - Continue outpatient follow-up with PCP   Hypokalemia -- replace as needed. --Trend CMP   Forgetfulness -Patient sister endorses patient has become increasingly forgetful outpatient.  She wonders if he has dementia.  Head CT on arrival does demonstrate  small vascular ischemic disease - Will refer to neurology at discharge for cognitive  testing.  Given patient's comorbidities vascular dementia is a certain possibility.  At this time he is alert and oriented x 3  Discharge Instructions  Discharge Instructions     Ambulatory referral to Neurology   Complete by: As directed    An appointment is requested in approximately: 4 weeks for dementia work up   Call MD for:  difficulty breathing, headache or visual disturbances   Complete by: As directed    Call MD for:  persistant dizziness or light-headedness   Complete by: As directed    Call MD for:  persistant nausea and vomiting   Complete by: As directed    Call MD for:  severe uncontrolled pain   Complete by: As directed    Call MD for:  temperature >100.4   Complete by: As directed    Diet general   Complete by: As directed    Discharge instructions   Complete by: As directed    While admitted we made some changes to your blood pressure medications. Please review your discharge medications closely to ensure you are taking the correct meds at home. Please take your blood pressure once a day and keep a log. See your primary care doctor in one week to review this log and make further medication changes.   Increase activity slowly   Complete by: As directed       Allergies as of 04/04/2024   No Known Allergies      Medication List     STOP taking these medications    amLODipine  10 MG tablet Commonly known as: NORVASC    gabapentin 100 MG capsule Commonly known as: NEURONTIN   losartan -hydrochlorothiazide  100-12.5 MG tablet Commonly known as: HYZAAR       TAKE these medications    acetaminophen  500 MG tablet Commonly known as: TYLENOL  Take 1,000 mg by mouth every 6 (six) hours as needed for moderate pain or headache.   albuterol  108 (90 Base) MCG/ACT inhaler Commonly known as: VENTOLIN  HFA INHALE ONE TO TWO PUFFS BY MOUTH EVERY FOUR TO SIX HOURS AS NEEDED FOR SHORTNESS OF BREATH/WHEEZING   amoxicillin -clavulanate 875-125 MG tablet Commonly known  as: AUGMENTIN  Take 1 tablet by mouth 2 (two) times daily for 2 days.   aspirin  81 MG tablet Take 81 mg by mouth daily.   atorvastatin  40 MG tablet Commonly known as: LIPITOR Take 1 tablet (40 mg total) by mouth daily.   azelastine  0.1 % nasal spray Commonly known as: ASTELIN  Place 1 spray into both nostrils 2 (two) times daily.   cyclobenzaprine  5 MG tablet Commonly known as: FLEXERIL  TAKE ONE TABLET (5 MG TOTAL) BY MOUTH THREE TIMES DAILY AS NEEDED FOR MUSCLE SPASMS.   ferrous sulfate  325 (65 FE) MG tablet Take 1 tablet (325 mg total) by mouth daily with breakfast.   finasteride  5 MG tablet Commonly known as: PROSCAR  Take 1 tablet (5 mg total) by mouth daily.   fluticasone  50 MCG/ACT nasal spray Commonly known as: FLONASE  Place 2 sprays into both nostrils daily as needed for allergies.   LORazepam  0.5 MG tablet Commonly known as: ATIVAN  Take 0.5 mg by mouth daily as needed for anxiety.   losartan  50 MG tablet Commonly known as: COZAAR  Take 1 tablet (50 mg total) by mouth daily. Start taking on: April 05, 2024   tamsulosin  0.4 MG Caps capsule Commonly known as: FLOMAX  Take 1 capsule (0.4 mg total) by mouth  daily.   traMADol  50 MG tablet Commonly known as: ULTRAM  Take 1 tablet (50 mg total) by mouth every 12 (twelve) hours as needed.   Trelegy Ellipta  100-62.5-25 MCG/ACT Aepb Generic drug: Fluticasone -Umeclidin-Vilant Inhale 1 puff into the lungs daily.   Vitamin D3 125 MCG (5000 UT) Tabs Take 5,000 Units by mouth daily.        No Known Allergies  Consultations:    Procedures/Studies: ECHOCARDIOGRAM COMPLETE Result Date: 03/31/2024    ECHOCARDIOGRAM REPORT   Patient Name:   Colton Nguyen [facility] Date of Exam: 03/31/2024 Medical Rec #:  [id number]           Height:       68.0 in Accession #:    [id number]          Weight:       136.7 lb Date of Birth:  08-Jun-1946           BSA:          1.738 m Patient Age:    78 years            BP:           158/73 mmHg  Patient Gender: M                   HR:           71 bpm. Exam Location:  [facility] Procedure: 2D Echo, Cardiac Doppler and Color Doppler (Both Spectral and Color            Flow Doppler were utilized during procedure). Indications:     CHF-acute diastolic I50.31  History:         Patient has no prior history of Echocardiogram examinations.                  CHF, COPD; Risk Factors:Hypertension.  Sonographer:     Christopher Furnace Referring Phys:  [id number] CORT ONEIDA MANA Diagnosing Phys: Marsa Dooms MD  Sonographer Comments: No parasternal window and no apical window. Image acquisition challenging due to COPD and Image acquisition challenging due to patient body habitus. IMPRESSIONS  1. Left ventricular ejection fraction, by estimation, is 60 to 65%. The left ventricle has normal function. The left ventricle has no regional wall motion abnormalities. Left ventricular diastolic parameters are indeterminate.  2. Right ventricular systolic function is normal. The right ventricular size is normal.  3. The mitral valve is normal in structure. Trivial mitral valve regurgitation. No evidence of mitral stenosis.  4. The aortic valve is normal in structure. Aortic valve regurgitation is not visualized. No aortic stenosis is present.  5. The inferior vena cava is normal in size with greater than 50% respiratory variability, suggesting right atrial pressure of 3 mmHg. FINDINGS  Left Ventricle: Left ventricular ejection fraction, by estimation, is 60 to 65%. The left ventricle has normal function. The left ventricle has no regional wall motion abnormalities. Strain was performed and the global longitudinal strain is indeterminate. The left ventricular internal cavity size was normal in size. There is no left ventricular hypertrophy. Left ventricular diastolic parameters are indeterminate. Right Ventricle: The right ventricular size is normal. No increase in right ventricular wall thickness. Right ventricular systolic function is  normal. Left Atrium: Left atrial size was normal in size. Right Atrium: Right atrial size was normal in size. Pericardium: There is no evidence of pericardial effusion. Mitral Valve: The mitral valve is normal in structure. Trivial mitral valve regurgitation. No evidence of mitral valve stenosis. Tricuspid  Valve: The tricuspid valve is normal in structure. Tricuspid valve regurgitation is trivial. No evidence of tricuspid stenosis. Aortic Valve: The aortic valve is normal in structure. Aortic valve regurgitation is not visualized. No aortic stenosis is present. Pulmonic Valve: The pulmonic valve was normal in structure. Pulmonic valve regurgitation is not visualized. No evidence of pulmonic stenosis. Aorta: The aortic root is normal in size and structure. Venous: The inferior vena cava is normal in size with greater than 50% respiratory variability, suggesting right atrial pressure of 3 mmHg. IAS/Shunts: No atrial level shunt detected by color flow Doppler. Additional Comments: 3D was performed not requiring image post processing on an independent workstation and was indeterminate.  LEFT VENTRICLE PLAX 2D LVIDd:         3.70 cm LVIDs:         2.20 cm LV PW:         1.40 cm LV IVS:        0.80 cm  LEFT ATRIUM           Index LA Vol (A4C): 45.1 ml 25.94 ml/m Marsa Dooms MD Electronically signed by Marsa Dooms MD Signature Date/Time: 03/31/2024/3:16:38 PM    Final    CT Angio Chest PE W/Cm &/Or Wo Cm Result Date: 03/30/2024 CLINICAL DATA:  Multiple falls with decreased p.o. intake. Increased weakness. Non-small-cell lung cancer. * Tracking Code: BO * EXAM: CT ANGIOGRAPHY CHEST WITH CONTRAST TECHNIQUE: Multidetector CT imaging of the chest was performed using the standard protocol during bolus administration of intravenous contrast. Multiplanar CT image reconstructions and MIPs were obtained to evaluate the vascular anatomy. RADIATION DOSE REDUCTION: This exam was performed according to the  departmental dose-optimization program which includes automated exposure control, adjustment of the mA and/or kV according to patient size and/or use of iterative reconstruction technique. CONTRAST:  OMNIPAQUE  IOHEXOL  350 MG/ML SOLN COMPARISON:  Chest CT 07/21/2023 FINDINGS: Cardiovascular: The heart size is normal. No substantial pericardial effusion. Moderate atherosclerotic calcification is noted in the wall of the thoracic aorta. Enlargement of the pulmonary outflow tract/main pulmonary arteries suggests pulmonary arterial hypertension. There is no filling defect within the opacified pulmonary arteries to suggest the presence of an acute pulmonary embolus. Mediastinum/Nodes: No mediastinal lymphadenopathy. Multinodular thyroid enlargement is similar to prior. There is no hilar lymphadenopathy. Similar soft tissue fullness in the right hilum is likely treatment related. The esophagus has normal imaging features. There is no axillary lymphadenopathy. Lungs/Pleura: Centrilobular and paraseptal emphysema evident. Architectural distortion and scarring in the suprahilar right lung is similar to prior and compatible with treatment related fibrosis. New patchy airspace disease identified posterior right lower lobe, suspicious for pneumonia and aspiration would be a consideration. 6 mm left upper lobe pulmonary nodule is stable in the interval on image 36/5. Subpleural 5 mm posterior right lung nodule on 101/5 is unchanged. Stable 7 mm subpleural left lower lobe nodule on 116/5. Upper Abdomen: Stable nodular thickening of both adrenal glands. Musculoskeletal: No worrisome lytic or sclerotic osseous abnormality. Review of the MIP images confirms the above findings. IMPRESSION: 1. No CT evidence for acute pulmonary embolus. 2. New patchy airspace disease posterior right lower lobe, suspicious for pneumonia and aspiration would be a consideration. 3. Stable architectural distortion and scarring in the suprahilar right  lung compatible with treatment related fibrosis. 4. Stable bilateral pulmonary nodules. 5. Stable nodular thickening of both adrenal glands. 6. Aortic Atherosclerosis (ICD10-I70.0) and Emphysema (ICD10-J43.9). Electronically Signed   By: Camellia Candle M.D.   On: 03/30/2024 05:57  US  Venous Img Lower Bilateral Result Date: 03/30/2024 CLINICAL DATA:  Bilateral lower extremity swelling. EXAM: BILATERAL LOWER EXTREMITY VENOUS DOPPLER ULTRASOUND TECHNIQUE: Gray-scale sonography with graded compression, as well as color Doppler and duplex ultrasound were performed to evaluate the lower extremity deep venous systems from the level of the common femoral vein and including the common femoral, femoral, profunda femoral, popliteal and calf veins including the posterior tibial, peroneal and gastrocnemius veins when visible. The superficial great saphenous vein was also interrogated. Spectral Doppler was utilized to evaluate flow at rest and with distal augmentation maneuvers in the common femoral, femoral and popliteal veins. COMPARISON:  None Available. FINDINGS: RIGHT LOWER EXTREMITY Common Femoral Vein: No evidence of thrombus. Normal compressibility, respiratory phasicity and response to augmentation. Saphenofemoral Junction: No evidence of thrombus. Normal compressibility and flow on color Doppler imaging. Profunda Femoral Vein: No evidence of thrombus. Normal compressibility and flow on color Doppler imaging. Femoral Vein: No evidence of thrombus. Normal compressibility, respiratory phasicity and response to augmentation. Popliteal Vein: No evidence of thrombus. Normal compressibility, respiratory phasicity and response to augmentation. Calf Veins: No evidence of thrombus. Normal compressibility and flow on color Doppler imaging. Other Findings:  None. LEFT LOWER EXTREMITY Common Femoral Vein: No evidence of thrombus. Normal compressibility, respiratory phasicity and response to augmentation. Saphenofemoral Junction:  No evidence of thrombus. Normal compressibility and flow on color Doppler imaging. Profunda Femoral Vein: No evidence of thrombus. Normal compressibility and flow on color Doppler imaging. Femoral Vein: No evidence of thrombus. Normal compressibility, respiratory phasicity and response to augmentation. Popliteal Vein: No evidence of thrombus. Normal compressibility, respiratory phasicity and response to augmentation. Calf Veins: No evidence of thrombus. Normal compressibility and flow on color Doppler imaging. Other Findings:  None. IMPRESSION: No evidence of deep venous thrombosis in either lower extremity. Electronically Signed   By: Camellia Candle M.D.   On: 03/30/2024 05:19   DG Chest Portable 1 View Result Date: 03/30/2024 CLINICAL DATA:  Frequent falls, increased weakness. EXAM: PORTABLE CHEST 1 VIEW COMPARISON:  PA Lat chest 02/06/2018.  Chest CT 07/21/2023. FINDINGS: Post treatment changes, fibrotic consolidation and volume loss are again noted in the right upper lobe. The lungs are emphysematous and otherwise clear. There is chronic right lateral sulcal blunting. Mediastinal configuration appear stable with aortic uncoiling and atherosclerosis. The cardiac size is normal. The left sulci are sharp. Thoracic cage appears intact. Slight thoracic dextroscoliosis. IMPRESSION: 1. No evidence of acute chest disease. 2. Emphysema. 3. Post treatment changes, fibrotic consolidation and volume loss in the right upper lobe. 4. Aortic atherosclerosis. Electronically Signed   By: Francis Quam M.D.   On: 03/30/2024 03:02   CT Head Wo Contrast Result Date: 03/30/2024 EXAM: CT HEAD WITHOUT CONTRAST 03/30/2024 02:31:41 AM TECHNIQUE: CT of the head was performed without the administration of intravenous contrast. Automated exposure control, iterative reconstruction, and/or weight based adjustment of the mA/kV was utilized to reduce the radiation dose to as low as reasonably achievable. COMPARISON: MRI brain dated  02/21/2015. CLINICAL HISTORY: Head trauma, minor (Age >= 65y). Per ed notes; BIBA due to multiple falls - at least 3 per EMS. Reduced PO. Increased weakness. FINDINGS: BRAIN AND VENTRICLES: Global cortical atrophy. Subcortical and periventricular small vessel ischemic changes. No acute hemorrhage. Gray-white differentiation is preserved. No hydrocephalus. No extra-axial collection. No mass effect or midline shift. ORBITS: No acute abnormality. SINUSES: No acute abnormality. SOFT TISSUES AND SKULL: No acute soft tissue abnormality. No skull fracture. IMPRESSION: 1. No acute intracranial abnormality. 2. Atrophy  with small vessel ischemic changes. Electronically signed by: Pinkie Pebbles MD 03/30/2024 02:39 AM EDT RP Workstation: [id number]      Discharge Exam: Vitals:   04/04/24 0352 04/04/24 1153  BP: (!) 160/74 107/61  Pulse: 83 93  Resp: 20 18  Temp: 98.7 F (37.1 C) 98.5 F (36.9 C)  SpO2: 98% 98%   Vitals:   04/03/24 2035 04/04/24 0352 04/04/24 0501 04/04/24 1153  BP:  (!) 160/74  107/61  Pulse:  83  93  Resp:  20  18  Temp:  98.7 F (37.1 C)  98.5 F (36.9 C)  TempSrc:      SpO2: 99% 98%  98%  Weight:   63.7 kg   Height:        Constitutional:  Normal appearance. Non toxic-appearing.  HENT: Head Normocephalic and atraumatic.  Mucous membranes are moist.  Eyes:  Extraocular intact. Conjunctivae normal.  Cardiovascular: Rate and Rhythm: Normal rate and regular rhythm.  Pulmonary: Non labored, symmetric rise of chest wall. 2L O2 in place.  Skin: warm and dry. not jaundiced.  Neurological: No focal deficit present. alert. Oriented.  Psychiatric: Mood and Affect congruent.    The results of significant diagnostics from this hospitalization (including imaging, microbiology, ancillary and laboratory) are listed below for reference.     Microbiology: Recent Results (from the past 240 hours)  Blood culture (routine x 2)     Status: None   Collection Time: 03/30/24  7:04 AM    Specimen: BLOOD LEFT ARM  Result Value Ref Range Status   Specimen Description BLOOD LEFT ARM  Final   Special Requests   Final    BOTTLES DRAWN AEROBIC AND ANAEROBIC Blood Culture adequate volume   Culture   Final    NO GROWTH 5 DAYS Performed at [facility], [address] [place], [place] [address]    Report Status 04/04/2024 FINAL  Final  Blood culture (routine x 2)     Status: None   Collection Time: 03/30/24 11:26 PM   Specimen: BLOOD  Result Value Ref Range Status   Specimen Description BLOOD BLOOD RIGHT ARM  Final   Special Requests   Final    BOTTLES DRAWN AEROBIC AND ANAEROBIC Blood Culture adequate volume   Culture   Final    NO GROWTH 5 DAYS Performed at [facility], [address] [place], [place] [address]    Report Status 04/04/2024 FINAL  Final     Labs: BNP (last 3 results) Recent Labs    03/30/24 0510  BNP 62.3   Basic Metabolic Panel: Recent Labs  Lab 03/31/24 0423 04/01/24 0228 04/02/24 0844 04/03/24 0347 04/04/24 0427  NA 140 137 137 142 140  K 3.7 3.4* 3.9 3.8 3.7  CL 91* 90* 95* 97* 100  CO2 40* 39* 35* 35* 33*  GLUCOSE 128* 89 97 99 95  BUN 21 33* 32* 41* 39*  CREATININE 0.92 0.95 0.98 0.89 0.83  CALCIUM  8.7* 9.0 8.3* 8.6* 8.5*  MG  --  1.9  --  2.0 2.0  PHOS  --  2.7  --  4.0 3.3   Liver Function Tests: Recent Labs  Lab 03/30/24 0245 04/02/24 0844 04/03/24 0347 04/04/24 0427  AST 26 24 18 16   ALT 18 16 14 16   ALKPHOS 67 52 49 50  BILITOT 0.6 0.4 0.6 0.5  PROT 6.1* 5.0* 5.1* 5.5*  ALBUMIN  3.0* 2.6* 2.6* 2.7*   No results for input(s): LIPASE, AMYLASE in the last  168 hours. No results for input(s): AMMONIA in the last 168 hours. CBC: Recent Labs  Lab 03/30/24 0510 04/02/24 0844 04/03/24 0347 04/04/24 0427  WBC 7.6 8.3 7.4 7.4  NEUTROABS 5.8 6.5 5.5 5.5  HGB 10.4* 10.2* 9.7* 9.6*  HCT 34.7* 32.1* 31.8* 30.6*  MCV 80.0 76.1* 76.8* 76.1*  PLT 215 210 210 186   Cardiac Enzymes: No  results for input(s): CKTOTAL, CKMB, CKMBINDEX, TROPONINI in the last 168 hours. BNP: Invalid input(s): POCBNP CBG: No results for input(s): GLUCAP in the last 168 hours. D-Dimer No results for input(s): DDIMER in the last 72 hours. Hgb A1c No results for input(s): HGBA1C in the last 72 hours. Lipid Profile No results for input(s): CHOL, HDL, LDLCALC, TRIG, CHOLHDL, LDLDIRECT in the last 72 hours. Thyroid function studies No results for input(s): TSH, T4TOTAL, T3FREE, THYROIDAB in the last 72 hours.  Invalid input(s): FREET3 Anemia work up No results for input(s): VITAMINB12, FOLATE, FERRITIN, TIBC, IRON, RETICCTPCT in the last 72 hours. Urinalysis    Component Value Date/Time   COLORURINE STRAW (A) 04/01/2024 1136   APPEARANCEUR CLEAR (A) 04/01/2024 1136   LABSPEC 1.006 04/01/2024 1136   PHURINE 8.0 04/01/2024 1136   GLUCOSEU NEGATIVE 04/01/2024 1136   HGBUR NEGATIVE 04/01/2024 1136   BILIRUBINUR NEGATIVE 04/01/2024 1136   KETONESUR NEGATIVE 04/01/2024 1136   PROTEINUR NEGATIVE 04/01/2024 1136   NITRITE NEGATIVE 04/01/2024 1136   LEUKOCYTESUR NEGATIVE 04/01/2024 1136   Sepsis Labs Recent Labs  Lab 03/30/24 0510 04/02/24 0844 04/03/24 0347 04/04/24 0427  WBC 7.6 8.3 7.4 7.4   Microbiology Recent Results (from the past 240 hours)  Blood culture (routine x 2)     Status: None   Collection Time: 03/30/24  7:04 AM   Specimen: BLOOD LEFT ARM  Result Value Ref Range Status   Specimen Description BLOOD LEFT ARM  Final   Special Requests   Final    BOTTLES DRAWN AEROBIC AND ANAEROBIC Blood Culture adequate volume   Culture   Final    NO GROWTH 5 DAYS Performed at [facility], [address], [place], [place] [address]    Report Status 04/04/2024 FINAL  Final  Blood culture (routine x 2)     Status: None   Collection Time: 03/30/24 11:26 PM   Specimen: BLOOD  Result Value Ref Range Status   Specimen  Description BLOOD BLOOD RIGHT ARM  Final   Special Requests   Final    BOTTLES DRAWN AEROBIC AND ANAEROBIC Blood Culture adequate volume   Culture   Final    NO GROWTH 5 DAYS Performed at [facility], [address], [place], [place] [address]    Report Status 04/04/2024 FINAL  Final     Time coordinating discharge: 32 min    SIGNED: Wilhelm Ganaway, DO [facility] 04/04/2024, 2:22 PM Pager   If 7PM-7AM, please contact night-coverage

## 2024-04-04 NOTE — Progress Notes (Signed)
  Progress Note   Date: 04/03/2024  Patient Name: Colton Nguyen        MRN#: [id number]   The diagnosis of pneumonia presumed aspiration pneumonia ruled in.  Is currently on antibiotic treatment.

## 2024-04-04 NOTE — TOC Transition Note (Signed)
 Transition of Care [facility]) - Discharge Note   Patient Details  Name: Colton Nguyen MRN: [id number] Date of Birth: 03-20-1946  Transition [facility] Care [facility]) CM/SW Contact:  Tomasa JAYSON Childes, RN Phone Number: 04/04/2024, 3:36 PM   Clinical Narrative:    Spoke with Darrian in admissions @ [facility]  Per facility patient admission confirmed for today. Patient assigned room # 101 Report will be called to [phone] Face sheet and medical necessity forms printed to the floor to be added to the EMS pack EMS arranged Maybe before 5:30pm.  Discharge summary and SNF transfer report sent in HUB.  Nurse, and family notified spoke with patient's sister, [name] SINNING signing off.    Final next level of care: Skilled Nursing Facility Barriers to Discharge: Barriers Resolved   Patient Goals and CMS Choice Patient states their goals for this hospitalization and ongoing recovery are:: SNF CMS Medicare.gov Compare Post Acute Care list provided to:: Patient Choice offered to / list presented to : Patient      Discharge Placement              Patient chooses bed at: [facility] Patient to be transferred to facility by: [facility] Name of family member notified: Sister, [name] Patient and family notified of of transfer: 04/04/24  Discharge Plan and Services Additional resources added to the After Visit Summary for                                       Social Drivers of Health (SDOH) Interventions SDOH Screenings   Transportation Needs: No Transportation Needs (01/30/2023)  Utilities: Not At Risk (01/30/2023)  Depression (PHQ2-9): Low Risk  (01/30/2023)  Financial Resource Strain: Low Risk  (01/30/2023)  Social Connections: Unknown (01/20/2022)   Received from [facility]  Tobacco Use: High Risk (03/30/2024)     Readmission Risk Interventions     No data to display

## 2024-04-04 NOTE — Progress Notes (Signed)
 Occupational Therapy Treatment Patient Details Name: Colton Nguyen MRN: [id number] DOB: 03-17-1946 Today's Date: 04/04/2024   History of present illness 78 y.o. male past medical history significant for COPD on a baseline home 2 L of oxygen , CAD, thyroid disorder, hypertension, hyperlipidemia presents to the emergency department after multiple falls.  Patient had multiple episodes of passing out over the past couple of days according to his sister.  She states that he has been more confused and having memory issues that has been worsening over the past week.   OT comments  Pt received in room with NT, pt expressing need for toileting. Pt completes functional STS transfers and mobility t/f bathroom with CGA, MIN A required to control descent to standard height commode. MAX A for clothing mgmt and pericare after continent BM. LB bathing / dressing completed from STS, pt fatigues quickly and experiences DOE, O2 sats and HR WNL. Pt left in recliner, needs in reach. OT will continue to follow, discharge recommendation remains appropriate.       If plan is discharge home, recommend the following:  A little help with walking and/or transfers;A lot of help with bathing/dressing/bathroom;Assistance with cooking/housework;Assist for transportation;Help with stairs or ramp for entrance   Equipment Recommendations  Other (comment)       Precautions / Restrictions Precautions Precautions: Fall Recall of Precautions/Restrictions: Intact Restrictions Weight Bearing Restrictions Per Provider Order: No       Mobility Bed Mobility Overal bed mobility: Needs Assistance             General bed mobility comments: Pt recieved and left in recliner    Transfers Overall transfer level: Needs assistance Equipment used: Rolling walker (2 wheels) Transfers: Sit to/from Stand Sit to Stand: Contact guard assist     Step pivot transfers: Min assist     General transfer comment: MIN A for  transfers to lower commode. cues for hand placement and assist at trunk to control descent     Balance Overall balance assessment: Needs assistance Sitting-balance support: No upper extremity supported, Feet supported Sitting balance-Leahy Scale: Good     Standing balance support: During functional activity, Single extremity supported Standing balance-Leahy Scale: Fair Standing balance comment: reliant on UE support in standing                           ADL either performed or assessed with clinical judgement   ADL Overall ADL's : Needs assistance/impaired     Grooming: Wash/dry hands;Sitting       Lower Body Bathing: Minimal assistance;Sit to/from stand       Lower Body Dressing: Minimal assistance;Maximal assistance;Sit to/from stand Lower Body Dressing Details (indicate cue type and reason): maxA to doff/don both socks. pt able to lift leg against gravity Toilet Transfer: Minimal assistance;Grab bars Toilet Transfer Details (indicate cue type and reason): MIN A to lower/rise from standard commode, heavy use of grab bars Toileting- Clothing Manipulation and Hygiene: Maximal assistance;Sit to/from stand Toileting - Clothing Manipulation Details (indicate cue type and reason): assistance for clothing management and hygiene     Functional mobility during ADLs: Contact guard assist General ADL Comments: t/f bathroom using RW, CGA for mobility, pt requires assist for pericare and clothing mgmt after continent BM. LB bathing completed from STS     Communication Communication Communication: No apparent difficulties   Cognition Arousal: Alert Behavior During Therapy: [facility] for tasks assessed/performed, Impulsive  Following commands: Intact        Cueing   Cueing Techniques: Verbal cues        General Comments NT in room upon arrival; pt with increased DOE with activity, HR and O2 WNL on 2L/min (pt baseline)     Pertinent Vitals/ Pain       Pain Assessment Pain Assessment: No/denies pain   Frequency  Min 2X/week        Progress Toward Goals  OT Goals(current goals can now be found in the care plan section)  Progress towards OT goals: Progressing toward goals  Acute Rehab OT Goals OT Goal Formulation: With patient/family Time For Goal Achievement: 04/13/24 Potential to Achieve Goals: Fair ADL Goals Pt Will Perform Grooming: with supervision;standing Pt Will Perform Lower Body Dressing: with supervision;sit to/from stand Pt Will Transfer to Toilet: with supervision;ambulating Pt Will Perform Toileting - Clothing Manipulation and hygiene: with supervision;sit to/from stand  Plan         AM-PAC OT 6 Clicks Daily Activity     Outcome Measure   Help from another person eating meals?: None Help from another person taking care of personal grooming?: None Help from another person toileting, which includes using toliet, bedpan, or urinal?: A Lot Help from another person bathing (including washing, rinsing, drying)?: A Lot Help from another person to put on and taking off regular upper body clothing?: A Little Help from another person to put on and taking off regular lower body clothing?: A Lot 6 Click Score: 17    End of Session Equipment Utilized During Treatment: Rolling walker (2 wheels);Oxygen   OT Visit Diagnosis: Unsteadiness on feet (R26.81);Muscle weakness (generalized) (M62.81)   Activity Tolerance Patient tolerated treatment well   Patient Left in chair;with chair alarm set;with call bell/phone within reach   Nurse Communication Mobility status        Time: 1350-1414 OT Time Calculation (min): 24 min  Charges: OT General Charges $OT Visit: 1 Visit OT Treatments $Self Care/Home Management : 23-37 mins  Hosea Hanawalt L. Yachet Mattson, OTR/L  04/04/24, 2:25 PM

## 2024-04-11 ENCOUNTER — Telehealth: Payer: Self-pay | Admitting: Family

## 2024-04-11 NOTE — Telephone Encounter (Signed)
 Lonell, RN with [facility], called in to inform us  they do not currently have a bath aide, nursing will be seeing him 2w1, then 1w4. May need additional PRN visits for COPD flare-ups.   Verbal orders given to Tenisha,

## 2024-04-14 ENCOUNTER — Ambulatory Visit (INDEPENDENT_AMBULATORY_CARE_PROVIDER_SITE_OTHER): Admitting: Cardiology

## 2024-04-14 ENCOUNTER — Encounter: Payer: Self-pay | Admitting: Cardiology

## 2024-04-14 VITALS — BP 124/85 | HR 76 | Ht 68.0 in

## 2024-04-14 DIAGNOSIS — J9611 Chronic respiratory failure with hypoxia: Secondary | ICD-10-CM

## 2024-04-14 DIAGNOSIS — J441 Chronic obstructive pulmonary disease with (acute) exacerbation: Secondary | ICD-10-CM | POA: Diagnosis not present

## 2024-04-14 DIAGNOSIS — F32A Depression, unspecified: Secondary | ICD-10-CM | POA: Diagnosis not present

## 2024-04-14 DIAGNOSIS — Z9981 Dependence on supplemental oxygen: Secondary | ICD-10-CM

## 2024-04-14 DIAGNOSIS — G2581 Restless legs syndrome: Secondary | ICD-10-CM

## 2024-04-14 DIAGNOSIS — Z720 Tobacco use: Secondary | ICD-10-CM

## 2024-04-14 DIAGNOSIS — Z7689 Persons encountering health services in other specified circumstances: Secondary | ICD-10-CM

## 2024-04-14 MED ORDER — BUPROPION HCL ER (SR) 150 MG PO TB12: 150.0000 mg | ORAL_TABLET | Freq: Two times a day (BID) | ORAL | 2 refills | Status: DC

## 2024-04-14 MED ORDER — ROPINIROLE HCL 0.25 MG PO TABS: 0.2500 mg | ORAL_TABLET | Freq: Three times a day (TID) | ORAL | 0 refills | Status: DC

## 2024-04-14 MED ORDER — NICOTINE 21 MG/24HR TD PT24: 21.0000 mg | MEDICATED_PATCH | TRANSDERMAL | 1 refills | Status: DC

## 2024-04-14 MED ORDER — LIDOCAINE 5 % EX PTCH: 1.0000 | MEDICATED_PATCH | CUTANEOUS | 0 refills | Status: AC

## 2024-04-14 MED ORDER — FUROSEMIDE 20 MG PO TABS: 20.0000 mg | ORAL_TABLET | Freq: Every day | ORAL | 0 refills | Status: DC

## 2024-04-14 NOTE — Progress Notes (Signed)
 Established Patient Office Visit  Subjective:  Patient ID: Colton Nguyen, male    DOB: [birth year]  Age: 78 y.o. MRN: [id number]  Chief Complaint  Patient presents with   Follow-up    Hospital follow up    Patient in office for hospital follow up. Patient sister advocating for patient. Patient to ED on 03/30/2024 for multiple falls, decrease food intake. Patient treated for pneumonia. Patient presents today with multipole complaints and requests.  Patient oxygen  normal on 2 L O2. Patient complaining of lower extremity edema, will send in furosemide  20 mg daily. Echo 03/2024 unremarkable. Also recommend compression hose 12- 14 mmHg. Patient requesting lidocaine  patches, prescription sent in.  Patient attempting to quit smoking, requesting nicotine  patches. Prescription sent in. Sister reports patient has been having bouts of depression, will send in Wellbutrin  to also help with smoking cessation.  Patient complaining of restless legs, will send in Requip .  Patient needing nail care, will send referral to podiatry. Patient has COPD, recent exacerbation, on supplemental O2. Will send referral to pulmonary.     No other concerns at this time.   Past Medical History:  Diagnosis Date   Acute respiratory failure with hypoxia (HCC) 01/28/2023   Anxiety    situational   Arthritis    BPH (benign prostatic hyperplasia)    CAD (coronary artery disease)    CHF (congestive heart failure) (HCC)    pt denies 11/13/2019   Chronic cardiopulmonary disease (HCC)    COPD (chronic obstructive pulmonary disease) (HCC)    History of kidney stones    Hyperlipidemia    Hypertension    Hypertension 12/28/2016   lung ca dx'd 03/2015   Mass of lung    Nicotine  dependence    Personal history of kidney stones    Restless leg    Shortness of breath dyspnea    with exertion   Tuberculosis    Wears glasses     Past Surgical History:  Procedure Laterality Date   BACK SURGERY     BIOPSY   01/25/2020   Procedure: BIOPSY;  Surgeon: Legrand Victory LITTIE DOUGLAS, MD;  Location: [facility];  Service: Gastroenterology;;   CAD CCTA 01/31/15     CERVICAL FUSION     COLONOSCOPY     COLONOSCOPY WITH PROPOFOL  N/A 01/25/2020   Procedure: COLONOSCOPY WITH PROPOFOL ;  Surgeon: Legrand Victory LITTIE DOUGLAS, MD;  Location: [facility];  Service: Gastroenterology;  Laterality: N/A;   ESOPHAGOGASTRODUODENOSCOPY (EGD) WITH PROPOFOL  N/A 01/25/2020   Procedure: ESOPHAGOGASTRODUODENOSCOPY (EGD) WITH PROPOFOL ;  Surgeon: Legrand Victory LITTIE DOUGLAS, MD;  Location: [facility];  Service: Gastroenterology;  Laterality: N/A;   FRACTURE SURGERY     left ankle   HEMOSTASIS CLIP PLACEMENT  01/25/2020   Procedure: HEMOSTASIS CLIP PLACEMENT;  Surgeon: Legrand Victory LITTIE DOUGLAS, MD;  Location: [facility];  Service: Gastroenterology;;   HOT HEMOSTASIS N/A 01/25/2020   Procedure: HOT HEMOSTASIS (ARGON PLASMA COAGULATION/BICAP);  Surgeon: Legrand Victory LITTIE DOUGLAS, MD;  Location: [facility];  Service: Gastroenterology;  Laterality: N/A;   LUNG BIOPSY N/A 02/27/2015   Procedure: LUNG BIOPSY;  Surgeon: Dallas KATHEE Jude, MD;  Location: [facility];  Service: Thoracic;  Laterality: N/A;   right inguinal hernia repair at age 86     VIDEO BRONCHOSCOPY WITH ENDOBRONCHIAL ULTRASOUND N/A 02/27/2015   Procedure: VIDEO BRONCHOSCOPY WITH ENDOBRONCHIAL ULTRASOUND;  Surgeon: Dallas KATHEE Jude, MD;  Location: [facility];  Service: Thoracic;  Laterality: N/A;    Social History   Socioeconomic History  Marital status: Divorced    Spouse name: Not on file   Number of children: Not on file   Years of education: Not on file   Highest education level: Not on file  Occupational History   Not on file  Tobacco Use   Smoking status: Every Day    Current packs/day: 1.50    Average packs/day: 1.5 packs/day for 57.2 years (85.7 ttl pk-yrs)    Types: Cigarettes    Start date: 02/14/1967    Passive exposure: Current   Smokeless tobacco: Never  Vaping Use   Vaping status: Former   Substance and Sexual Activity   Alcohol use: Yes    Alcohol/week: 0.0 standard drinks of alcohol    Comment: ocassional   Drug use: No   Sexual activity: Yes  Other Topics Concern   Not on file  Social History Narrative   Not on file   Social Drivers of Health   Financial Resource Strain: Low Risk  (01/30/2023)   Overall Financial Resource Strain (CARDIA)    Difficulty of Paying Living Expenses: Not very hard  Food Insecurity: Not on file  Transportation Needs: No Transportation Needs (01/30/2023)   PRAPARE - Transportation    Lack of Transportation (Medical): No    Lack of Transportation (Non-Medical): No  Physical Activity: Not on file  Stress: Not on file  Social Connections: Unknown (01/20/2022)   [facility]   Social Network    Social Network: Not on file  Intimate Partner Violence: Not At Risk (01/30/2023)   Humiliation, Afraid, Rape, and Kick questionnaire    Fear of Current or Ex-Partner: No    Emotionally Abused: No    Physically Abused: No    Sexually Abused: No    Family History  Problem Relation Age of Onset   Cancer Other    Heart disease Father    Lung cancer Father    Hypertension Other    Diabetes Mother    Valvular heart disease Mother    Diabetes Sister    Colon polyps Maternal Uncle    Diabetes Sister    Diabetes Sister    Diabetes Niece    Colon cancer Cousin    Esophageal cancer Neg Hx    Stomach cancer Neg Hx    Pancreatic cancer Neg Hx     No Known Allergies  Outpatient Medications Prior to Visit  Medication Sig   acetaminophen  (TYLENOL ) 500 MG tablet Take 1,000 mg by mouth every 6 (six) hours as needed for moderate pain or headache.   albuterol  (VENTOLIN  HFA) 108 (90 Base) MCG/ACT inhaler INHALE ONE TO TWO PUFFS BY MOUTH EVERY FOUR TO SIX HOURS AS NEEDED FOR SHORTNESS OF BREATH/WHEEZING   aspirin  81 MG tablet Take 81 mg by mouth daily.   atorvastatin  (LIPITOR) 40 MG tablet Take 1 tablet (40 mg total) by mouth daily.    azelastine  (ASTELIN ) 0.1 % nasal spray Place 1 spray into both nostrils 2 (two) times daily.   Cholecalciferol  (VITAMIN D3) 5000 UNITS TABS Take 5,000 Units by mouth daily.   cyclobenzaprine  (FLEXERIL ) 5 MG tablet TAKE ONE TABLET (5 MG TOTAL) BY MOUTH THREE TIMES DAILY AS NEEDED FOR MUSCLE SPASMS.   ferrous sulfate  325 (65 FE) MG tablet Take 1 tablet (325 mg total) by mouth daily with breakfast.   finasteride  (PROSCAR ) 5 MG tablet Take 1 tablet (5 mg total) by mouth daily.   fluticasone  (FLONASE ) 50 MCG/ACT nasal spray Place 2 sprays into both nostrils daily as needed  for allergies.    Fluticasone -Umeclidin-Vilant (TRELEGY ELLIPTA ) 100-62.5-25 MCG/ACT AEPB Inhale 1 puff into the lungs daily.   LORazepam  (ATIVAN ) 0.5 MG tablet Take 0.5 mg by mouth daily as needed for anxiety.   losartan  (COZAAR ) 50 MG tablet Take 1 tablet (50 mg total) by mouth daily.   tamsulosin  (FLOMAX ) 0.4 MG CAPS capsule Take 1 capsule (0.4 mg total) by mouth daily.   traMADol  (ULTRAM ) 50 MG tablet Take 1 tablet (50 mg total) by mouth every 12 (twelve) hours as needed.   No facility-administered medications prior to visit.    Review of Systems  Constitutional: Negative.   HENT: Negative.    Eyes: Negative.   Respiratory: Negative.  Negative for shortness of breath.   Cardiovascular:  Positive for leg swelling. Negative for chest pain.  Gastrointestinal: Negative.  Negative for abdominal pain, constipation and diarrhea.  Genitourinary: Negative.   Musculoskeletal:  Positive for back pain. Negative for joint pain and myalgias.  Skin: Negative.   Neurological: Negative.  Negative for dizziness and headaches.  Endo/Heme/Allergies: Negative.   Psychiatric/Behavioral:  Positive for depression.   All other systems reviewed and are negative.      Objective:   BP 124/85   Pulse 76   Ht 5' 8 (1.727 m)   SpO2 97%   BMI 21.35 kg/m   Vitals:   04/14/24 1121  BP: 124/85  Pulse: 76  Height: 5' 8 (1.727 m)  SpO2:  97%    Physical Exam Vitals and nursing note reviewed.  Constitutional:      Appearance: Normal appearance. He is normal weight.  HENT:     Head: Normocephalic and atraumatic.     Nose: Nose normal.     Mouth/Throat:     Mouth: Mucous membranes are moist.     Pharynx: Oropharynx is clear.  Eyes:     Extraocular Movements: Extraocular movements intact.     Conjunctiva/sclera: Conjunctivae normal.     Pupils: Pupils are equal, round, and reactive to light.  Cardiovascular:     Rate and Rhythm: Normal rate and regular rhythm.     Pulses: Normal pulses.     Heart sounds: Normal heart sounds.  Pulmonary:     Effort: Pulmonary effort is normal.     Breath sounds: Wheezing present.  Abdominal:     General: Abdomen is flat. Bowel sounds are normal.     Palpations: Abdomen is soft.  Musculoskeletal:        General: Normal range of motion.     Cervical back: Normal range of motion.     Right lower leg: Edema present.     Left lower leg: Edema present.  Skin:    General: Skin is warm and dry.  Neurological:     General: No focal deficit present.     Mental Status: He is alert and oriented to person, place, and time.  Psychiatric:        Mood and Affect: Mood normal.        Behavior: Behavior normal.        Thought Content: Thought content normal.        Judgment: Judgment normal.      No results found for any visits on 04/14/24.  Recent Results (from the past 2160 hours)  Comprehensive metabolic panel     Status: Abnormal   Collection Time: 03/30/24  2:45 AM  Result Value Ref Range   Sodium 138 135 - 145 mmol/L   Potassium 4.3 3.5 - 5.1  mmol/L   Chloride 93 (L) 98 - 111 mmol/L   CO2 34 (H) 22 - 32 mmol/L   Glucose, Bld 95 70 - 99 mg/dL    Comment: Glucose reference range applies only to samples taken after fasting for at least 8 hours.   BUN 17 8 - 23 mg/dL   Creatinine, Ser 9.10 0.61 - 1.24 mg/dL   Calcium  8.5 (L) 8.9 - 10.3 mg/dL   Total Protein 6.1 (L) 6.5 - 8.1  g/dL   Albumin  3.0 (L) 3.5 - 5.0 g/dL   AST 26 15 - 41 U/L   ALT 18 0 - 44 U/L   Alkaline Phosphatase 67 38 - 126 U/L   Total Bilirubin 0.6 0.0 - 1.2 mg/dL   GFR, Estimated >39 >39 mL/min    Comment: (NOTE) Calculated using the CKD-EPI Creatinine Equation (2021)    Anion gap 11 5 - 15    Comment: Performed at [facility], [address], [place], [place] [address]  Lactic acid, plasma     Status: None   Collection Time: 03/30/24  2:45 AM  Result Value Ref Range   Lactic Acid, Venous 0.9 0.5 - 1.9 mmol/L    Comment: Performed at [facility], [address], [place], [place] [address]  Troponin I (High Sensitivity)     Status: None   Collection Time: 03/30/24  2:45 AM  Result Value Ref Range   Troponin I (High Sensitivity) 8 <18 ng/L    Comment: (NOTE) Elevated high sensitivity troponin I (hsTnI) values and significant  changes across serial measurements may suggest ACS but many other  chronic and acute conditions are known to elevate hsTnI results.  Refer to the Links section for chest pain algorithms and additional  guidance. Performed at [facility], [address] [place], [place] [address]   TSH     Status: None   Collection Time: 03/30/24  2:45 AM  Result Value Ref Range   TSH 0.814 0.350 - 4.500 uIU/mL    Comment: Performed by a 3rd Generation assay with a functional sensitivity of <=0.01 uIU/mL. Performed at [facility], [address] [place], [place] [address]   Blood gas, venous     Status: Abnormal   Collection Time: 03/30/24  3:28 AM  Result Value Ref Range   pH, Ven 7.32 7.25 - 7.43   pCO2, Ven 94 (HH) 44 - 60 mmHg    Comment: CRITICAL RESULT CALLED TO, READ BACK BY AND VERIFIED WITH: DR. CLOTILDA MUMMA AT [id number] ON [id number] BE    Bicarbonate 48.4 (H) 20.0 - 28.0 mmol/L   Acid-Base Excess 17.3 (H) 0.0 - 2.0 mmol/L   O2 Saturation 37.9 %   Patient temperature 37.0    Collection site VEIN     Comment: Performed at  [facility], [address] [place], [place] [address]  D-dimer, quantitative     Status: Abnormal   Collection Time: 03/30/24  3:28 AM  Result Value Ref Range   D-Dimer, Quant 1.43 (H) 0.00 - 0.50 ug/mL-FEU    Comment: (NOTE) At the manufacturer cut-off value of 0.5 g/mL FEU, this assay has a negative predictive value of 95-100%.This assay is intended for use in conjunction with a clinical pretest probability (PTP) assessment model to exclude pulmonary embolism (PE) and deep venous thrombosis (DVT) in outpatients suspected of PE or DVT. Results should be correlated with clinical presentation. Performed at [facility], [address], [place], [place] [address]  Protime-INR     Status: None   Collection Time: 03/30/24  3:28 AM  Result Value Ref Range   Prothrombin Time 14.6 11.4 - 15.2 seconds   INR 1.1 0.8 - 1.2    Comment: (NOTE) INR goal varies based on device and disease states. Performed at [facility], [address] [place], [place] [address]   Brain natriuretic peptide     Status: None   Collection Time: 03/30/24  5:10 AM  Result Value Ref Range   B Natriuretic Peptide 62.3 0.0 - 100.0 pg/mL    Comment: Performed at [facility], [address] [place], [place] [address]  CBC with Differential/Platelet     Status: Abnormal   Collection Time: 03/30/24  5:10 AM  Result Value Ref Range   WBC 7.6 4.0 - 10.5 K/uL   RBC 4.34 4.22 - 5.81 MIL/uL   Hemoglobin 10.4 (L) 13.0 - 17.0 g/dL   HCT 65.2 (L) 60.9 - 47.9 %   MCV 80.0 80.0 - 100.0 fL   MCH 24.0 (L) 26.0 - 34.0 pg   MCHC 30.0 30.0 - 36.0 g/dL   RDW 81.8 (H) 88.4 - 84.4 %   Platelets 215 150 - 400 K/uL   nRBC 0.0 0.0 - 0.2 %   Neutrophils Relative % 77 %   Neutro Abs 5.8 1.7 - 7.7 K/uL   Lymphocytes Relative 14 %   Lymphs Abs 1.1 0.7 - 4.0 K/uL   Monocytes Relative 8 %   Monocytes Absolute 0.6 0.1 - 1.0 K/uL   Eosinophils Relative 1 %   Eosinophils Absolute 0.1 0.0 -  0.5 K/uL   Basophils Relative 0 %   Basophils Absolute 0.0 0.0 - 0.1 K/uL   Immature Granulocytes 0 %   Abs Immature Granulocytes 0.02 0.00 - 0.07 K/uL    Comment: Performed at [facility], [address] [place], [place] [address]  Blood culture (routine x 2)     Status: None   Collection Time: 03/30/24  7:04 AM   Specimen: BLOOD LEFT ARM  Result Value Ref Range   Specimen Description BLOOD LEFT ARM    Special Requests      BOTTLES DRAWN AEROBIC AND ANAEROBIC Blood Culture adequate volume   Culture      NO GROWTH 5 DAYS Performed at [facility], [address] [place], [place] [address]    Report Status 04/04/2024 FINAL   Blood gas, venous     Status: Abnormal   Collection Time: 03/30/24  7:58 AM  Result Value Ref Range   Delivery systems BILEVEL POSITIVE AIRWAY PRESSURE    pH, Ven 7.47 (H) 7.25 - 7.43   pCO2, Ven 58 44 - 60 mmHg   pO2, Ven 62 (H) 32 - 45 mmHg   Bicarbonate 42.2 (H) 20.0 - 28.0 mmol/L   Acid-Base Excess 15.6 (H) 0.0 - 2.0 mmol/L   O2 Saturation 94 %   Patient temperature 37.0    Collection site VENOUS     Comment: Performed at [facility], [address] [place], [place] [address]  Blood culture (routine x 2)     Status: None   Collection Time: 03/30/24 11:26 PM   Specimen: BLOOD  Result Value Ref Range   Specimen Description BLOOD BLOOD RIGHT ARM    Special Requests      BOTTLES DRAWN AEROBIC AND ANAEROBIC Blood Culture adequate volume   Culture      NO GROWTH 5 DAYS Performed at [facility], [address] [place], [place] [address]    Report Status 04/04/2024 FINAL   HIV Antibody (routine testing w rflx)     Status: None   Collection Time: 03/30/24 11:26 PM  Result Value Ref Range   HIV Screen 4th Generation wRfx Non Reactive Non Reactive    Comment: Performed at [facility], [address], [place], [place] [address]  Basic metabolic panel     Status: Abnormal   Collection Time: 03/31/24   4:23 AM  Result Value Ref Range   Sodium 140 135 - 145 mmol/L   Potassium 3.7 3.5 - 5.1 mmol/L   Chloride 91 (L) 98 - 111 mmol/L   CO2 40 (H) 22 - 32 mmol/L   Glucose, Bld 128 (H) 70 - 99 mg/dL    Comment: Glucose reference range applies only to samples taken after fasting for at least 8 hours.   BUN 21 8 - 23 mg/dL   Creatinine, Ser 9.07 0.61 - 1.24 mg/dL   Calcium  8.7 (L) 8.9 - 10.3 mg/dL   GFR, Estimated >39 >39 mL/min    Comment: (NOTE) Calculated using the CKD-EPI Creatinine Equation (2021)    Anion gap 9 5 - 15    Comment: Performed at [facility], [address] [place], [place] [address]  ECHOCARDIOGRAM COMPLETE     Status: None   Collection Time: 03/31/24  8:31 AM  Result Value Ref Range   Weight 2,186.96 oz   Height 68 in   BP 158/73 mmHg   S' Lateral 2.20 cm   Est EF 60 - 65%   Basic metabolic panel     Status: Abnormal   Collection Time: 04/01/24  2:28 AM  Result Value Ref Range   Sodium 137 135 - 145 mmol/L   Potassium 3.4 (L) 3.5 - 5.1 mmol/L   Chloride 90 (L) 98 - 111 mmol/L   CO2 39 (H) 22 - 32 mmol/L   Glucose, Bld 89 70 - 99 mg/dL    Comment: Glucose reference range applies only to samples taken after fasting for at least 8 hours.   BUN 33 (H) 8 - 23 mg/dL   Creatinine, Ser 9.04 0.61 - 1.24 mg/dL   Calcium  9.0 8.9 - 10.3 mg/dL   GFR, Estimated >39 >39 mL/min    Comment: (NOTE) Calculated using the CKD-EPI Creatinine Equation (2021)    Anion gap 8 5 - 15    Comment: Performed at [facility], [address] [place], [place] [address]  Magnesium      Status: None   Collection Time: 04/01/24  2:28 AM  Result Value Ref Range   Magnesium  1.9 1.7 - 2.4 mg/dL    Comment: Performed at [facility], [address] [place], [place] [address]  Phosphorus     Status: None   Collection Time: 04/01/24  2:28 AM  Result Value Ref Range   Phosphorus 2.7 2.5 - 4.6 mg/dL    Comment: Performed at [facility], [address] [place], [place] [address]  Urinalysis, w/ Reflex to Culture (Infection Suspected) -Urine, Clean Catch     Status: Abnormal   Collection Time: 04/01/24 11:36 AM  Result Value Ref Range   Specimen Source URINE, CLEAN CATCH    Color, Urine STRAW (A) YELLOW   APPearance CLEAR (A) CLEAR   Specific Gravity, Urine 1.006 1.005 - 1.030   pH 8.0 5.0 - 8.0   Glucose, UA NEGATIVE NEGATIVE mg/dL  Hgb urine dipstick NEGATIVE NEGATIVE   Bilirubin Urine NEGATIVE NEGATIVE   Ketones, ur NEGATIVE NEGATIVE mg/dL   Protein, ur NEGATIVE NEGATIVE mg/dL   Nitrite NEGATIVE NEGATIVE   Leukocytes,Ua NEGATIVE NEGATIVE   RBC / HPF 0-5 0 - 5 RBC/hpf   WBC, UA 0-5 0 - 5 WBC/hpf    Comment:        Reflex urine culture not performed if WBC <=10, OR if Squamous epithelial cells >5. If Squamous epithelial cells >5 suggest recollection.    Bacteria, UA NONE SEEN NONE SEEN   Squamous Epithelial / HPF 0-5 0 - 5 /HPF   Mucus PRESENT     Comment: Performed at [facility], [address] [place], [place] [address]  CBC with Differential/Platelet     Status: Abnormal   Collection Time: 04/02/24  8:44 AM  Result Value Ref Range   WBC 8.3 4.0 - 10.5 K/uL   RBC 4.22 4.22 - 5.81 MIL/uL   Hemoglobin 10.2 (L) 13.0 - 17.0 g/dL   HCT 67.8 (L) 60.9 - 47.9 %   MCV 76.1 (L) 80.0 - 100.0 fL   MCH 24.2 (L) 26.0 - 34.0 pg   MCHC 31.8 30.0 - 36.0 g/dL   RDW 81.6 (H) 88.4 - 84.4 %   Platelets 210 150 - 400 K/uL   nRBC 0.0 0.0 - 0.2 %   Neutrophils Relative % 78 %   Neutro Abs 6.5 1.7 - 7.7 K/uL   Lymphocytes Relative 13 %   Lymphs Abs 1.1 0.7 - 4.0 K/uL   Monocytes Relative 9 %   Monocytes Absolute 0.7 0.1 - 1.0 K/uL   Eosinophils Relative 0 %   Eosinophils Absolute 0.0 0.0 - 0.5 K/uL   Basophils Relative 0 %   Basophils Absolute 0.0 0.0 - 0.1 K/uL   Immature Granulocytes 0 %   Abs Immature Granulocytes 0.03 0.00 - 0.07 K/uL    Comment: Performed at [facility], [address]  [place], [place] [address]  Comprehensive metabolic panel     Status: Abnormal   Collection Time: 04/02/24  8:44 AM  Result Value Ref Range   Sodium 137 135 - 145 mmol/L   Potassium 3.9 3.5 - 5.1 mmol/L   Chloride 95 (L) 98 - 111 mmol/L   CO2 35 (H) 22 - 32 mmol/L   Glucose, Bld 97 70 - 99 mg/dL    Comment: Glucose reference range applies only to samples taken after fasting for at least 8 hours.   BUN 32 (H) 8 - 23 mg/dL   Creatinine, Ser 9.01 0.61 - 1.24 mg/dL   Calcium  8.3 (L) 8.9 - 10.3 mg/dL   Total Protein 5.0 (L) 6.5 - 8.1 g/dL   Albumin  2.6 (L) 3.5 - 5.0 g/dL   AST 24 15 - 41 U/L   ALT 16 0 - 44 U/L   Alkaline Phosphatase 52 38 - 126 U/L   Total Bilirubin 0.4 0.0 - 1.2 mg/dL   GFR, Estimated >39 >39 mL/min    Comment: (NOTE) Calculated using the CKD-EPI Creatinine Equation (2021)    Anion gap 7 5 - 15    Comment: Performed at [facility], [address], [place], [place] [address]  Magnesium      Status: None   Collection Time: 04/03/24  3:47 AM  Result Value Ref Range   Magnesium  2.0 1.7 - 2.4 mg/dL    Comment: Performed at [facility], [address] [place], [place] [address]  Comprehensive  metabolic panel with GFR     Status: Abnormal   Collection Time: 04/03/24  3:47 AM  Result Value Ref Range   Sodium 142 135 - 145 mmol/L   Potassium 3.8 3.5 - 5.1 mmol/L   Chloride 97 (L) 98 - 111 mmol/L   CO2 35 (H) 22 - 32 mmol/L   Glucose, Bld 99 70 - 99 mg/dL    Comment: Glucose reference range applies only to samples taken after fasting for at least 8 hours.   BUN 41 (H) 8 - 23 mg/dL   Creatinine, Ser 9.10 0.61 - 1.24 mg/dL   Calcium  8.6 (L) 8.9 - 10.3 mg/dL   Total Protein 5.1 (L) 6.5 - 8.1 g/dL   Albumin  2.6 (L) 3.5 - 5.0 g/dL   AST 18 15 - 41 U/L   ALT 14 0 - 44 U/L   Alkaline Phosphatase 49 38 - 126 U/L   Total Bilirubin 0.6 0.0 - 1.2 mg/dL   GFR, Estimated >39 >39 mL/min    Comment: (NOTE) Calculated using the CKD-EPI Creatinine Equation  (2021)    Anion gap 10 5 - 15    Comment: Performed at [facility], [address] [place], [place] [address]  CBC with Differential/Platelet     Status: Abnormal   Collection Time: 04/03/24  3:47 AM  Result Value Ref Range   WBC 7.4 4.0 - 10.5 K/uL   RBC 4.14 (L) 4.22 - 5.81 MIL/uL   Hemoglobin 9.7 (L) 13.0 - 17.0 g/dL   HCT 68.1 (L) 60.9 - 47.9 %   MCV 76.8 (L) 80.0 - 100.0 fL   MCH 23.4 (L) 26.0 - 34.0 pg   MCHC 30.5 30.0 - 36.0 g/dL   RDW 81.5 (H) 88.4 - 84.4 %   Platelets 210 150 - 400 K/uL   nRBC 0.0 0.0 - 0.2 %   Neutrophils Relative % 75 %   Neutro Abs 5.5 1.7 - 7.7 K/uL   Lymphocytes Relative 16 %   Lymphs Abs 1.2 0.7 - 4.0 K/uL   Monocytes Relative 9 %   Monocytes Absolute 0.7 0.1 - 1.0 K/uL   Eosinophils Relative 0 %   Eosinophils Absolute 0.0 0.0 - 0.5 K/uL   Basophils Relative 0 %   Basophils Absolute 0.0 0.0 - 0.1 K/uL   Immature Granulocytes 0 %   Abs Immature Granulocytes 0.02 0.00 - 0.07 K/uL    Comment: Performed at [facility], [address], [place], [place] [address]  Phosphorus     Status: None   Collection Time: 04/03/24  3:47 AM  Result Value Ref Range   Phosphorus 4.0 2.5 - 4.6 mg/dL    Comment: Performed at [facility], [address] [place], [place] [address]  Magnesium      Status: None   Collection Time: 04/04/24  4:27 AM  Result Value Ref Range   Magnesium  2.0 1.7 - 2.4 mg/dL    Comment: Performed at [facility], [address] [place], [place] [address]  Comprehensive metabolic panel with GFR     Status: Abnormal   Collection Time: 04/04/24  4:27 AM  Result Value Ref Range   Sodium 140 135 - 145 mmol/L   Potassium 3.7 3.5 - 5.1 mmol/L   Chloride 100 98 - 111 mmol/L   CO2 33 (H) 22 - 32 mmol/L   Glucose, Bld 95 70 - 99 mg/dL    Comment: Glucose reference range applies only to samples taken after fasting for at  least 8 hours.   BUN 39 (H) 8 - 23 mg/dL   Creatinine, Ser 9.16 0.61 - 1.24 mg/dL    Calcium  8.5 (L) 8.9 - 10.3 mg/dL   Total Protein 5.5 (L) 6.5 - 8.1 g/dL   Albumin  2.7 (L) 3.5 - 5.0 g/dL   AST 16 15 - 41 U/L   ALT 16 0 - 44 U/L   Alkaline Phosphatase 50 38 - 126 U/L   Total Bilirubin 0.5 0.0 - 1.2 mg/dL   GFR, Estimated >39 >39 mL/min    Comment: (NOTE) Calculated using the CKD-EPI Creatinine Equation (2021)    Anion gap 7 5 - 15    Comment: Performed at [facility], [address] [place], [place] [address]  CBC with Differential/Platelet     Status: Abnormal   Collection Time: 04/04/24  4:27 AM  Result Value Ref Range   WBC 7.4 4.0 - 10.5 K/uL   RBC 4.02 (L) 4.22 - 5.81 MIL/uL   Hemoglobin 9.6 (L) 13.0 - 17.0 g/dL   HCT 69.3 (L) 60.9 - 47.9 %   MCV 76.1 (L) 80.0 - 100.0 fL   MCH 23.9 (L) 26.0 - 34.0 pg   MCHC 31.4 30.0 - 36.0 g/dL   RDW 81.5 (H) 88.4 - 84.4 %   Platelets 186 150 - 400 K/uL    Comment: REPEATED TO VERIFY   nRBC 0.0 0.0 - 0.2 %   Neutrophils Relative % 74 %   Neutro Abs 5.5 1.7 - 7.7 K/uL   Lymphocytes Relative 17 %   Lymphs Abs 1.2 0.7 - 4.0 K/uL   Monocytes Relative 9 %   Monocytes Absolute 0.7 0.1 - 1.0 K/uL   Eosinophils Relative 0 %   Eosinophils Absolute 0.0 0.0 - 0.5 K/uL   Basophils Relative 0 %   Basophils Absolute 0.0 0.0 - 0.1 K/uL   Immature Granulocytes 0 %   Abs Immature Granulocytes 0.03 0.00 - 0.07 K/uL    Comment: Performed at [facility], [address] [place], [place] [address]  Phosphorus     Status: None   Collection Time: 04/04/24  4:27 AM  Result Value Ref Range   Phosphorus 3.3 2.5 - 4.6 mg/dL    Comment: Performed at [facility], [address] [place], [place] [address]      Assessment & Plan:  Furosemide  20 mg daily Compression hose 12-14 mmHg Lidocaine  patches Nicotine  patches Wellbutrin  150 mg twice daily Requip  0.25 mg daily Referral sent to podiatry Referral sent to pulmonary  Problem List Items Addressed This Visit       Respiratory   COPD (chronic  obstructive pulmonary disease) (HCC) - Primary   Relevant Medications   nicotine  (NICODERM CQ  - DOSED IN MG/24 HOURS) 21 mg/24hr patch   Other Relevant Orders   Ambulatory referral to Pulmonology   Chronic respiratory failure with hypoxia, on home O2 therapy (HCC)   Relevant Orders   Ambulatory referral to Pulmonology     Other   Smoking trying to quit   Encounter for nail care   Relevant Orders   Ambulatory referral to Podiatry   Restless leg syndrome   Depression   Relevant Medications   buPROPion  (WELLBUTRIN  SR) 150 MG 12 hr tablet    Return in about 4 weeks (around 05/12/2024) for with amanda.   Total time spent: 25 minutes  [employer], NP  04/14/2024   This document may have been prepared by [employer] Voice Recognition software and as  such may include unintentional dictation errors.

## 2024-04-16 ENCOUNTER — Other Ambulatory Visit: Payer: Self-pay

## 2024-04-16 ENCOUNTER — Emergency Department

## 2024-04-16 ENCOUNTER — Encounter: Payer: Self-pay | Admitting: Emergency Medicine

## 2024-04-16 ENCOUNTER — Inpatient Hospital Stay
Admission: EM | Admit: 2024-04-16 | Discharge: 2024-04-21 | DRG: 177 | Disposition: A | Discharge status: Skilled Nursing Facility | Attending: Student in an Organized Health Care Education/Training Program | Admitting: Student in an Organized Health Care Education/Training Program

## 2024-04-16 DIAGNOSIS — Z923 Personal history of irradiation: Secondary | ICD-10-CM

## 2024-04-16 DIAGNOSIS — I161 Hypertensive emergency: Secondary | ICD-10-CM | POA: Diagnosis present

## 2024-04-16 DIAGNOSIS — N400 Enlarged prostate without lower urinary tract symptoms: Secondary | ICD-10-CM | POA: Diagnosis present

## 2024-04-16 DIAGNOSIS — J9611 Chronic respiratory failure with hypoxia: Secondary | ICD-10-CM

## 2024-04-16 DIAGNOSIS — Z9221 Personal history of antineoplastic chemotherapy: Secondary | ICD-10-CM

## 2024-04-16 DIAGNOSIS — J9621 Acute and chronic respiratory failure with hypoxia: Secondary | ICD-10-CM | POA: Diagnosis present

## 2024-04-16 DIAGNOSIS — Z79899 Other long term (current) drug therapy: Secondary | ICD-10-CM | POA: Diagnosis not present

## 2024-04-16 DIAGNOSIS — I2510 Atherosclerotic heart disease of native coronary artery without angina pectoris: Secondary | ICD-10-CM | POA: Diagnosis present

## 2024-04-16 DIAGNOSIS — Z833 Family history of diabetes mellitus: Secondary | ICD-10-CM

## 2024-04-16 DIAGNOSIS — I110 Hypertensive heart disease with heart failure: Secondary | ICD-10-CM | POA: Diagnosis present

## 2024-04-16 DIAGNOSIS — R0602 Shortness of breath: Secondary | ICD-10-CM | POA: Diagnosis present

## 2024-04-16 DIAGNOSIS — J441 Chronic obstructive pulmonary disease with (acute) exacerbation: Secondary | ICD-10-CM | POA: Diagnosis present

## 2024-04-16 DIAGNOSIS — G2581 Restless legs syndrome: Secondary | ICD-10-CM | POA: Diagnosis present

## 2024-04-16 DIAGNOSIS — I5033 Acute on chronic diastolic (congestive) heart failure: Secondary | ICD-10-CM | POA: Diagnosis present

## 2024-04-16 DIAGNOSIS — E785 Hyperlipidemia, unspecified: Secondary | ICD-10-CM | POA: Diagnosis present

## 2024-04-16 DIAGNOSIS — J189 Pneumonia, unspecified organism: Secondary | ICD-10-CM | POA: Diagnosis present

## 2024-04-16 DIAGNOSIS — J690 Pneumonitis due to inhalation of food and vomit: Principal | ICD-10-CM | POA: Insufficient documentation

## 2024-04-16 DIAGNOSIS — F17210 Nicotine dependence, cigarettes, uncomplicated: Secondary | ICD-10-CM | POA: Diagnosis present

## 2024-04-16 DIAGNOSIS — J9622 Acute and chronic respiratory failure with hypercapnia: Secondary | ICD-10-CM | POA: Diagnosis present

## 2024-04-16 DIAGNOSIS — Z9981 Dependence on supplemental oxygen: Secondary | ICD-10-CM | POA: Diagnosis not present

## 2024-04-16 DIAGNOSIS — Z801 Family history of malignant neoplasm of trachea, bronchus and lung: Secondary | ICD-10-CM

## 2024-04-16 DIAGNOSIS — Z85118 Personal history of other malignant neoplasm of bronchus and lung: Secondary | ICD-10-CM | POA: Diagnosis not present

## 2024-04-16 DIAGNOSIS — Z7982 Long term (current) use of aspirin: Secondary | ICD-10-CM

## 2024-04-16 DIAGNOSIS — Z8249 Family history of ischemic heart disease and other diseases of the circulatory system: Secondary | ICD-10-CM

## 2024-04-16 DIAGNOSIS — Z87442 Personal history of urinary calculi: Secondary | ICD-10-CM

## 2024-04-16 DIAGNOSIS — Z800 Family history of malignant neoplasm of digestive organs: Secondary | ICD-10-CM | POA: Diagnosis not present

## 2024-04-16 DIAGNOSIS — Z981 Arthrodesis status: Secondary | ICD-10-CM | POA: Diagnosis not present

## 2024-04-16 DIAGNOSIS — J9601 Acute respiratory failure with hypoxia: Principal | ICD-10-CM

## 2024-04-16 LAB — COMPREHENSIVE METABOLIC PANEL WITH GFR
ALT: 18 U/L (ref 0–44)
AST: 20 U/L (ref 15–41)
Albumin: 3.5 g/dL (ref 3.5–5.0)
Alkaline Phosphatase: 102 U/L (ref 38–126)
Anion gap: 7 (ref 5–15)
BUN: 11 mg/dL (ref 8–23)
CO2: 33 mmol/L — ABNORMAL HIGH (ref 22–32)
Calcium: 8.5 mg/dL — ABNORMAL LOW (ref 8.9–10.3)
Chloride: 94 mmol/L — ABNORMAL LOW (ref 98–111)
Creatinine, Ser: 0.94 mg/dL (ref 0.61–1.24)
GFR, Estimated: >60 mL/min (ref >60)
Glucose, Bld: 117 mg/dL — ABNORMAL HIGH (ref 70–99)
Potassium: 3.7 mmol/L (ref 3.5–5.1)
Sodium: 134 mmol/L — ABNORMAL LOW (ref 135–145)
Total Bilirubin: 0.5 mg/dL (ref 0.0–1.2)
Total Protein: 6.8 g/dL (ref 6.5–8.1)

## 2024-04-16 LAB — BLOOD GAS, VENOUS
Acid-Base Excess: 13.1 mmol/L — ABNORMAL HIGH (ref 0.0–2.0)
Bicarbonate: 41.2 mmol/L — ABNORMAL HIGH (ref 20.0–28.0)
O2 Saturation: 84.7 %
Patient temperature: 37
pCO2, Ven: 68 mmHg — ABNORMAL HIGH (ref 44–60)
pH, Ven: 7.39 (ref 7.25–7.43)
pO2, Ven: 55 mmHg — ABNORMAL HIGH (ref 32–45)

## 2024-04-16 LAB — CBC WITH DIFFERENTIAL/PLATELET
Abs Immature Granulocytes: 0.03 K/uL (ref 0.00–0.07)
Basophils Absolute: 0 K/uL (ref 0.0–0.1)
Basophils Relative: 0 %
Eosinophils Absolute: 0 K/uL (ref 0.0–0.5)
Eosinophils Relative: 0 %
HCT: 34.3 % — ABNORMAL LOW (ref 39.0–52.0)
Hemoglobin: 10.4 g/dL — ABNORMAL LOW (ref 13.0–17.0)
Immature Granulocytes: 0 %
Lymphocytes Relative: 9 %
Lymphs Abs: 0.9 K/uL (ref 0.7–4.0)
MCH: 24.2 pg — ABNORMAL LOW (ref 26.0–34.0)
MCHC: 30.3 g/dL (ref 30.0–36.0)
MCV: 80 fL (ref 80.0–100.0)
Monocytes Absolute: 0.3 K/uL (ref 0.1–1.0)
Monocytes Relative: 3 %
Neutro Abs: 8.1 K/uL — ABNORMAL HIGH (ref 1.7–7.7)
Neutrophils Relative %: 88 %
Platelets: 316 K/uL (ref 150–400)
RBC: 4.29 MIL/uL (ref 4.22–5.81)
RDW: 18.4 % — ABNORMAL HIGH (ref 11.5–15.5)
WBC: 9.3 K/uL (ref 4.0–10.5)
nRBC: 0 % (ref 0.0–0.2)

## 2024-04-16 LAB — TROPONIN I (HIGH SENSITIVITY)
Troponin I (High Sensitivity): 6 ng/L (ref <18)
Troponin I (High Sensitivity): 6 ng/L (ref <18)

## 2024-04-16 LAB — PROCALCITONIN: Procalcitonin: <0.1 ng/mL

## 2024-04-16 LAB — BRAIN NATRIURETIC PEPTIDE: B Natriuretic Peptide: 50.9 pg/mL (ref 0.0–100.0)

## 2024-04-16 LAB — LIPASE, BLOOD: Lipase: 30 U/L (ref 11–51)

## 2024-04-16 LAB — LACTIC ACID, PLASMA: Lactic Acid, Venous: 0.8 mmol/L (ref 0.5–1.9)

## 2024-04-16 MED ORDER — ENOXAPARIN SODIUM 40 MG/0.4ML IJ SOSY
40.0000 mg | PREFILLED_SYRINGE | INTRAMUSCULAR | Status: DC
  Administered 2024-04-16 – 2024-04-20 (×8): 40 mg via SUBCUTANEOUS
  Filled 2024-04-16 (×5): qty 0.4

## 2024-04-16 MED ORDER — ATORVASTATIN CALCIUM 20 MG PO TABS
40.0000 mg | ORAL_TABLET | Freq: Every day | ORAL | Status: DC
  Administered 2024-04-16 – 2024-04-21 (×9): 40 mg via ORAL
  Filled 2024-04-16 (×6): qty 2

## 2024-04-16 MED ORDER — IPRATROPIUM-ALBUTEROL 0.5-2.5 (3) MG/3ML IN SOLN
3.0000 mL | Freq: Once | RESPIRATORY_TRACT | Status: AC
  Administered 2024-04-16: 3 mL via RESPIRATORY_TRACT
  Filled 2024-04-16: qty 3

## 2024-04-16 MED ORDER — TAMSULOSIN HCL 0.4 MG PO CAPS
0.4000 mg | ORAL_CAPSULE | Freq: Every day | ORAL | Status: DC
  Administered 2024-04-16 – 2024-04-21 (×9): 0.4 mg via ORAL
  Filled 2024-04-16 (×6): qty 1

## 2024-04-16 MED ORDER — ASPIRIN 81 MG PO TBEC
81.0000 mg | DELAYED_RELEASE_TABLET | Freq: Every day | ORAL | Status: DC
  Administered 2024-04-16 – 2024-04-21 (×9): 81 mg via ORAL
  Filled 2024-04-16 (×6): qty 1

## 2024-04-16 MED ORDER — PANTOPRAZOLE SODIUM 40 MG PO TBEC
40.0000 mg | DELAYED_RELEASE_TABLET | Freq: Two times a day (BID) | ORAL | Status: DC
  Administered 2024-04-16 – 2024-04-21 (×16): 40 mg via ORAL
  Filled 2024-04-16 (×10): qty 1

## 2024-04-16 MED ORDER — CYCLOBENZAPRINE HCL 10 MG PO TABS: 5.0000 mg | ORAL_TABLET | Freq: Three times a day (TID) | ORAL | Status: DC | PRN

## 2024-04-16 MED ORDER — FLUTICASONE PROPIONATE 50 MCG/ACT NA SUSP: 2.0000 | Freq: Every day | NASAL | Status: DC | PRN

## 2024-04-16 MED ORDER — AZELASTINE HCL 0.1 % NA SOLN
1.0000 | Freq: Two times a day (BID) | NASAL | Status: DC
  Administered 2024-04-16 – 2024-04-21 (×17): 1 via NASAL
  Filled 2024-04-16: qty 30

## 2024-04-16 MED ORDER — FUROSEMIDE 20 MG PO TABS
20.0000 mg | ORAL_TABLET | Freq: Every day | ORAL | Status: DC
  Administered 2024-04-17 – 2024-04-21 (×8): 20 mg via ORAL
  Filled 2024-04-16 (×5): qty 1

## 2024-04-16 MED ORDER — FUROSEMIDE 10 MG/ML IJ SOLN
20.0000 mg | Freq: Once | INTRAMUSCULAR | Status: AC
  Administered 2024-04-16: 20 mg via INTRAVENOUS
  Filled 2024-04-16: qty 2

## 2024-04-16 MED ORDER — PREDNISONE 20 MG PO TABS
40.0000 mg | ORAL_TABLET | Freq: Every day | ORAL | Status: AC
  Administered 2024-04-16 – 2024-04-20 (×8): 40 mg via ORAL
  Filled 2024-04-16 (×5): qty 2

## 2024-04-16 MED ORDER — BUPROPION HCL ER (SR) 150 MG PO TB12
150.0000 mg | ORAL_TABLET | Freq: Two times a day (BID) | ORAL | Status: DC
  Administered 2024-04-16 – 2024-04-21 (×17): 150 mg via ORAL
  Filled 2024-04-16 (×12): qty 1

## 2024-04-16 MED ORDER — LOSARTAN POTASSIUM 50 MG PO TABS
50.0000 mg | ORAL_TABLET | Freq: Every day | ORAL | Status: DC
  Administered 2024-04-16 – 2024-04-21 (×9): 50 mg via ORAL
  Filled 2024-04-16 (×6): qty 1

## 2024-04-16 MED ORDER — NITROGLYCERIN 2 % TD OINT
1.0000 [in_us] | TOPICAL_OINTMENT | Freq: Once | TRANSDERMAL | Status: AC
  Administered 2024-04-16: 1 [in_us] via TOPICAL
  Filled 2024-04-16: qty 1

## 2024-04-16 MED ORDER — SODIUM CHLORIDE 0.9 % IV SOLN
3.0000 g | Freq: Four times a day (QID) | INTRAVENOUS | Status: DC
  Administered 2024-04-16 – 2024-04-19 (×21): 3 g via INTRAVENOUS
  Filled 2024-04-16 (×13): qty 8

## 2024-04-16 MED ORDER — LORAZEPAM 0.5 MG PO TABS: 0.5000 mg | ORAL_TABLET | Freq: Every day | ORAL | Status: DC | PRN

## 2024-04-16 MED ORDER — TRAMADOL HCL 50 MG PO TABS: 50.0000 mg | ORAL_TABLET | Freq: Two times a day (BID) | ORAL | Status: DC | PRN

## 2024-04-16 MED ORDER — NICOTINE 21 MG/24HR TD PT24
21.0000 mg | MEDICATED_PATCH | TRANSDERMAL | Status: DC
  Administered 2024-04-17 – 2024-04-21 (×8): 21 mg via TRANSDERMAL
  Filled 2024-04-16 (×6): qty 1

## 2024-04-16 MED ORDER — ROPINIROLE HCL 0.25 MG PO TABS
0.2500 mg | ORAL_TABLET | Freq: Three times a day (TID) | ORAL | Status: DC
  Administered 2024-04-16 – 2024-04-21 (×25): 0.25 mg via ORAL
  Filled 2024-04-16 (×16): qty 1

## 2024-04-16 MED ORDER — GUAIFENESIN ER 600 MG PO TB12
1200.0000 mg | ORAL_TABLET | Freq: Two times a day (BID) | ORAL | Status: DC
  Administered 2024-04-16 – 2024-04-21 (×17): 1200 mg via ORAL
  Filled 2024-04-16 (×11): qty 2

## 2024-04-16 MED ORDER — ALBUTEROL SULFATE (2.5 MG/3ML) 0.083% IN NEBU: 3.0000 mL | INHALATION_SOLUTION | Freq: Four times a day (QID) | RESPIRATORY_TRACT | Status: DC | PRN

## 2024-04-16 MED ORDER — BUDESON-GLYCOPYRROL-FORMOTEROL 160-9-4.8 MCG/ACT IN AERO
2.0000 | INHALATION_SPRAY | Freq: Two times a day (BID) | RESPIRATORY_TRACT | Status: DC
  Administered 2024-04-16 – 2024-04-21 (×17): 2 via RESPIRATORY_TRACT
  Filled 2024-04-16: qty 5.9

## 2024-04-16 MED ORDER — IPRATROPIUM-ALBUTEROL 0.5-2.5 (3) MG/3ML IN SOLN
3.0000 mL | Freq: Four times a day (QID) | RESPIRATORY_TRACT | Status: DC
  Administered 2024-04-16 – 2024-04-18 (×14): 3 mL via RESPIRATORY_TRACT
  Filled 2024-04-16 (×7): qty 3

## 2024-04-16 MED ORDER — FUROSEMIDE 40 MG PO TABS: 20.0000 mg | ORAL_TABLET | Freq: Every day | ORAL | Status: DC

## 2024-04-16 MED ORDER — FINASTERIDE 5 MG PO TABS
5.0000 mg | ORAL_TABLET | Freq: Every day | ORAL | Status: DC
  Administered 2024-04-16 – 2024-04-21 (×9): 5 mg via ORAL
  Filled 2024-04-16 (×6): qty 1

## 2024-04-16 NOTE — ED Triage Notes (Signed)
 Patient here with shortness of breath, recently diagnosed with PNA 3-4 days ago. Per EMS wheezing in lung fields, gave 2 duonebs along with 125 mg of solumedrol with some improvement however patient still labored with breathing. RA O2 sat with EMS was 86 %. On 2 L of O2 chronically.

## 2024-04-16 NOTE — Evaluation (Addendum)
 Clinical/Bedside Swallow Evaluation Patient Details  Name: Colton Nguyen MRN: [id number] Date of Birth: 02-03-1946  Today's Date: 04/16/2024 Time: SLP Start Time (ACUTE ONLY): 0901 SLP Stop Time (ACUTE ONLY): 1000 SLP Time Calculation (min) (ACUTE ONLY): 59 min  Past Medical History:  Past Medical History:  Diagnosis Date   Acute respiratory failure with hypoxia (HCC) 01/28/2023   Anxiety    situational   Arthritis    BPH (benign prostatic hyperplasia)    CAD (coronary artery disease)    CHF (congestive heart failure) (HCC)    pt denies 11/13/2019   Chronic cardiopulmonary disease (HCC)    COPD (chronic obstructive pulmonary disease) (HCC)    History of kidney stones    Hyperlipidemia    Hypertension    Hypertension 12/28/2016   lung ca dx'd 03/2015   Mass of lung    Nicotine  dependence    Personal history of kidney stones    Restless leg    Shortness of breath dyspnea    with exertion   Tuberculosis    Wears glasses    Past Surgical History:  Past Surgical History:  Procedure Laterality Date   BACK SURGERY     BIOPSY  01/25/2020   Procedure: BIOPSY;  Surgeon: Legrand Victory LITTIE DOUGLAS, MD;  Location: [facility];  Service: Gastroenterology;;   CAD CCTA 01/31/15     CERVICAL FUSION     COLONOSCOPY     COLONOSCOPY WITH PROPOFOL  N/A 01/25/2020   Procedure: COLONOSCOPY WITH PROPOFOL ;  Surgeon: Legrand Victory LITTIE DOUGLAS, MD;  Location: [facility];  Service: Gastroenterology;  Laterality: N/A;   ESOPHAGOGASTRODUODENOSCOPY (EGD) WITH PROPOFOL  N/A 01/25/2020   Procedure: ESOPHAGOGASTRODUODENOSCOPY (EGD) WITH PROPOFOL ;  Surgeon: Legrand Victory LITTIE DOUGLAS, MD;  Location: [facility];  Service: Gastroenterology;  Laterality: N/A;   FRACTURE SURGERY     left ankle   HEMOSTASIS CLIP PLACEMENT  01/25/2020   Procedure: HEMOSTASIS CLIP PLACEMENT;  Surgeon: Legrand Victory LITTIE DOUGLAS, MD;  Location: [facility];  Service: Gastroenterology;;   HOT HEMOSTASIS N/A 01/25/2020   Procedure: HOT HEMOSTASIS (ARGON  PLASMA COAGULATION/BICAP);  Surgeon: Legrand Victory LITTIE DOUGLAS, MD;  Location: [facility];  Service: Gastroenterology;  Laterality: N/A;   LUNG BIOPSY N/A 02/27/2015   Procedure: LUNG BIOPSY;  Surgeon: Dallas KATHEE Jude, MD;  Location: [facility] OR;  Service: Thoracic;  Laterality: N/A;   right inguinal hernia repair at age 23     VIDEO BRONCHOSCOPY WITH ENDOBRONCHIAL ULTRASOUND N/A 02/27/2015   Procedure: VIDEO BRONCHOSCOPY WITH ENDOBRONCHIAL ULTRASOUND;  Surgeon: Dallas KATHEE Jude, MD;  Location: [facility] OR;  Service: Thoracic;  Laterality: N/A;   HPI:  Colton Nguyen is a 78 y.o. 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   Current CT Imaging this admit:   There is a patulous esophagus with normal wall thickness.  Unremarkable trachea except for retained secretions in the distal  lumen. Axillary spaces are clear.     Lungs/Pleura: There is fluid in the right main bronchus and  scattered right lower lobe subsegmental bronchial impactions.     Patchy airspace disease continues to be seen in the posterior basal  right lower lobe with interval development of a small layering right  pleural effusion.  There is a stable appearance of post treatment fibrotic  consolidation, volume loss and cystic changes in the right apex  extending to the upper hilum.  Architectural distortion and scarring in the suprahilar right lung  is similar to prior and compatible with treatment related fibrosis.  There is moderate to advanced emphysematous disease with  centrilobular changes predominating.    Assessment / Plan / Recommendation  Clinical Impression   Pt seen for BSE this morning. Pt  awake, verbal and followed instructions w/ cue. Noted mild SOB and WOB w/ the exertion of sitting himself upright in bed -- also noted he tended to lean onto his RIGHT side. Pt has a Baseline of RIGHT Lung Ca w/ XRT/chemotherapy in past. Frequent Belching noted during this session. Pt denied any Reflux issues at home but endorsed some Phlegm. On O2 at home; while smoking cigarettes.  On [place] 4L O2 (on O2 at home). Afebrile. WBC WNL.  Pt appears to present w/ grossly adequate oropharyngeal phase swallow w/ No overt oropharyngeal phase dysphagia noted, No neuromuscular deficits noted. Pt consumed po trials w/ No overt, clinical s/s of aspiration during po trials. Rest Breaks encouraged b/t trials during session.  Pt appears at reduced risk for aspiration following general aspiration precautions. However, pt does have challenging factors that could impact oropharyngeal swallowing to include deconditioning/weakness, lacking Most Dentition for effective mastication, significant Pulmonary decline (Baseline RIGHT Lung Ca w/ XRT txs) impacting energy for oral intake Frequent Belching, Phlegm = Patulous Esophagus this admit per CT Imaging suggesting Esophageal phase Dysmotility possibly (GI), and hospitalization. These factors can increase risk for dysphagia as well as decreased oral intake overall.   During po trials, pt consumed all consistencies w/ no overt coughing, decline in vocal quality, or change in respiratory presentation during/post trials. O2 sats 98%. Oral phase appeared [facility] w/ timely bolus management and control of bolus propulsion for A-P transfer for swallowing of liquids and purees; Min Increased oral phase Time for mashing/gumming of softened solids d/t mostly Edentulous status. Oral clearing achieved w/ all trial consistencies -- moistened, soft foods given. OM Exam appeared [facility] w/ no unilateral weakness noted. Speech Clear. Pt fed self w/ setup support- shaky UEs. Rest Breaks encouraged every 2-3  bites/sips to calm any WOB.   Recommend a more Mech soft/regular consistency diet w/ well-Cut meats, moistened foods; Thin liquids via Cup -- carefully monitor Breathing and take Rest Breaks during meals for conservation of energy. No Straw use! Recommend general aspiration precautions, including small sips/bites Slowly. Pills WHOLE in Puree for safer, easier swallowing -- pt did this during the session w/ NSG/SLP and it was encouraged now and for D/C to the pt. Tray setup and sitting up for po intake.    Education given on need for Pills in Puree for safer swallowing; food consistencies and easy to eat options; tips for conservation of energy(rest breaks often); general aspiration AND REFLUX precautions dt/ the belching to pt.  MD/NSG updated, agreed. MD to reconsult if any new needs arise during admit. Recommend Dietician and Palliative Care f/u for support. Precautions posted in room, chart. SLP Visit Diagnosis: Dysphagia, unspecified (R13.10) (lacking Most Dentition for effective mastication; Pulmonary decline impacting energy for oral intake; Frequent Belching, Phlegm)    Aspiration Risk  Mild aspiration risk;Risk for inadequate nutrition/hydration (reduced when following precautions)    Diet Recommendation   Thin;Dysphagia 3 (mechanical soft);Age appropriate regular (cut/chopped meats; moistened foods.) = a more Mech soft/regular consistency diet w/ well-Cut meats, moistened foods;  Thin liquids via Cup -- carefully monitor Breathing and take Rest Breaks during meals for conservation of energy. No Straw use! Recommend general aspiration precautions, including small sips/bites Slowly. Tray setup and sitting up for po intake.   Medication Administration: Whole meds with puree (for safer swallowing)    Other  Recommendations Recommended Consults: Consider GI evaluation;Consider esophageal assessment (management; Dietician; Palliative Care for GOC in setting of Pulmonary decline) Oral Care  Recommendations: Oral care BID;Oral care before and after PO;Patient independent with oral care (setup)     Assistance Recommended at Discharge  intermittent  Functional Status Assessment Patient has not had a recent decline in their functional status (per his report, he eats just fine at home)  Frequency and Duration  (n/a)   (n/a)       Prognosis Prognosis for improved oropharyngeal function: Fair (-Good) Barriers to Reach Goals: Time post onset;Severity of deficits Barriers/Prognosis Comment: lacking Most Dentition for effective mastication; Pulmonary decline impacting energy for oral intake; Frequent Belching, Phlegm = Patulous Esophagus this admit suggesting Esophageal phase Dysmotility possibly(GI)      Swallow Study   General Date of Onset: 04/16/24 HPI: Colton Nguyen is a 78 y.o. 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   Current CT Imaging this admit:   There is a patulous esophagus with normal wall thickness.  Unremarkable trachea except for retained secretions in the distal  lumen. Axillary spaces are clear.     Lungs/Pleura: There is fluid in the right main bronchus and  scattered right lower lobe subsegmental bronchial impactions.     Patchy airspace disease continues to be seen in the posterior basal  right lower lobe with interval development of a small layering right  pleural effusion.   There is a stable appearance of post treatment fibrotic  consolidation, volume loss and cystic changes in the right apex  extending to the upper hilum.  Architectural distortion and scarring in the suprahilar right lung  is similar to prior  and compatible with treatment related fibrosis.  There is moderate to advanced emphysematous disease with  centrilobular changes predominating. Type of Study: Bedside Swallow Evaluation Previous Swallow Assessment: 03/30/2024 Diet Prior to this Study: Regular;Thin liquids (Level 0) Temperature Spikes Noted: No (wbc 9.3) Respiratory Status: Nasal cannula (4L - on O2 at home) History of Recent Intubation: No Behavior/Cognition: Alert;Cooperative;Pleasant mood Oral Cavity Assessment: Dry (min) Oral Care Completed by SLP: Yes Oral Cavity - Dentition: Missing dentition;Poor condition (most-all) Vision: Functional for self-feeding Self-Feeding Abilities: Able to feed self;Needs set up (shaky UEs) Patient Positioning: Upright in bed (supported upright sitting) Baseline Vocal Quality: Normal (min choppy d/t decreased breath support for lengthy sentences) Volitional Cough: Strong;Congested Volitional Swallow: Able to elicit    Oral/Motor/Sensory Function Overall Oral Motor/Sensory Function: Within functional limits   Ice Chips Ice chips: Within functional limits Presentation: Spoon (fed; 2 trials)   Thin Liquid Thin Liquid: Within functional limits Presentation: Cup;Self Fed (12+ trials) Other Comments: water, coffee    Nectar Thick Nectar Thick Liquid: Not tested   Honey Thick Honey Thick Liquid: Not tested   Puree Puree: Within functional limits Presentation: Self Fed;Spoon (4 ozs) Other Comments: w/ Pills too   Solid  Solid: Impaired (min - mastication effort) Presentation: Self Fed;Spoon (8 trials) Oral Phase Impairments: Impaired mastication (mostly edentulous) Pharyngeal Phase Impairments:  (none) Other Comments: moistened foods well        Colton Portugal, MS, CCC-SLP Speech Language Pathologist Rehab Services; [facility] [phone] (ascom) Colton Nguyen 04/16/2024,12:00 PM

## 2024-04-16 NOTE — ED Provider Notes (Signed)
 [facility] Provider Note    Event Date/Time   First MD Initiated Contact with Patient 04/16/24 [phone]     (approximate)   History   Shortness of Breath   HPI Colton Nguyen is a 78 y.o. male with a history of COPD on 2 L of oxygen  chronically and a recent diagnosis of CHF.  He presents by EMS for acute shortness of breath.  He became increasingly short of breath at home tonight and EMS was called.  When firefighters arrived they found that he was about 86% on room air although he is supposed to be on 2 L at baseline.  They started him on oxygen  and when the paramedics arrived they began treatment with a total of 2 DuoNeb's and Solu-Medrol  125 mg IV.  They had decreased breath sounds throughout and he was working hard to breathe.  Upon arrival to the ED he said that he feels better but is still short of breath and working hard to breathe.  He denies having any pain.  reportedly he was recently admitted to the hospital for pneumonia.  He has been taking his medications including some furosemide  that he was recently started on.  However, he has noticed increased swelling in his legs over the last few days.  He denies nausea, vomiting, abdominal pain, and fever.     Physical Exam   ED Triage Vitals  Encounter Vitals Group     BP 04/16/24 0330 (!) 201/86     Girls Systolic BP Percentile --      Girls Diastolic BP Percentile --      Boys Systolic BP Percentile --      Boys Diastolic BP Percentile --      Pulse Rate 04/16/24 0327 (!) 109     Resp 04/16/24 0327 19     Temp 04/16/24 0330 98.4 F (36.9 C)     Temp Source 04/16/24 0330 Oral     SpO2 04/16/24 0327 100 %     Weight 04/16/24 0325 63 kg (138 lb 14.2 oz)     Height 04/16/24 0325 1.727 m (5' 8)     Head Circumference --      Peak Flow --      Pain Score 04/16/24 0325 0     Pain Loc --      Pain Education --      Exclude from Growth Chart --       Most recent vital signs: Vitals:    04/16/24 0515 04/16/24 0530  BP: (!) 150/82 137/70  Pulse: 100 97  Resp: (!) 26 (!) 21  Temp:    SpO2: 100% 100%    General: Awake,  Alert and oriented, but speaking in short phrases due to the respiratory distress. CV:  Good peripheral perfusion.  Mild tachycardia, regular rate and rhythm Resp:  Increased respiratory effort with accessory muscle usage and intercostal retractions.  Decreased air movement throughout lung fields, no wheezing but breath sounds are tight and minimal. Abd:  No distention.  No tenderness to palpation. Other:  Alert and oriented despite respiratory distress.   ED Results / Procedures / Treatments   Labs (all labs ordered are listed, but only abnormal results are displayed) Labs Reviewed  COMPREHENSIVE METABOLIC PANEL WITH GFR - Abnormal; Notable for the following components:      Result Value   Sodium 134 (*)    Chloride 94 (*)    CO2 33 (*)    Glucose, Bld 117 (*)  Calcium  8.5 (*)    All other components within normal limits  BLOOD GAS, VENOUS - Abnormal; Notable for the following components:   pCO2, Ven 68 (*)    pO2, Ven 55 (*)    Bicarbonate 41.2 (*)    Acid-Base Excess 13.1 (*)    All other components within normal limits  CBC WITH DIFFERENTIAL/PLATELET - Abnormal; Notable for the following components:   Hemoglobin 10.4 (*)    HCT 34.3 (*)    MCH 24.2 (*)    RDW 18.4 (*)    Neutro Abs 8.1 (*)    All other components within normal limits  CULTURE, BLOOD (ROUTINE X 2)  CULTURE, BLOOD (ROUTINE X 2)  BRAIN NATRIURETIC PEPTIDE  LIPASE, BLOOD  LACTIC ACID, PLASMA  CBC WITH DIFFERENTIAL/PLATELET  PROCALCITONIN  TROPONIN I (HIGH SENSITIVITY)  TROPONIN I (HIGH SENSITIVITY)     EKG  ED ECG REPORT I, Darleene Dome, the attending physician, personally viewed and interpreted this ECG.  Date: 04/16/2024 EKG Time: 3:50 AM Rate: 95 Rhythm: normal sinus rhythm QRS Axis: normal Intervals: normal ST/T Wave abnormalities: normal Narrative  Interpretation: no evidence of acute ischemia    RADIOLOGY See ED course for details   PROCEDURES:  Critical Care performed: Yes, see critical care procedure note(s)  .Critical Care  Performed by: Dome Darleene, MD Authorized by: Dome Darleene, MD   Critical care provider statement:    Critical care time (minutes):  30   Critical care time was exclusive of:  Separately billable procedures and treating other patients   Critical care was necessary to treat or prevent imminent or life-threatening deterioration of the following conditions:  Respiratory failure   Critical care was time spent personally by me on the following activities:  Development of treatment plan with patient or surrogate, evaluation of patient's response to treatment, examination of patient, obtaining history from patient or surrogate, ordering and performing treatments and interventions, ordering and review of laboratory studies, ordering and review of radiographic studies, pulse oximetry, re-evaluation of patient's condition and review of old charts     IMPRESSION / MDM / ASSESSMENT AND PLAN / ED COURSE  I reviewed the triage vital signs and the nursing notes.                              Differential diagnosis includes, but is not limited to, COPD exacerbation, CHF exacerbation, pulmonary edema, ACS, pneumonia.  Patient's presentation is most consistent with acute presentation with potential threat to life or bodily function.  Labs/studies ordered: VBG, lactic acid, CMP, CBC with differential, BNP, high-sensitivity troponin, lipase, 1 view chest x-ray, EKG  Interventions/Medications given:  Medications  ipratropium-albuterol  (DUONEB) 0.5-2.5 (3) MG/3ML nebulizer solution 3 mL (3 mLs Nebulization Given 04/16/24 0331)  nitroGLYCERIN  (NITROGLYN) 2 % ointment 1 inch (1 inch Topical Given 04/16/24 0434)    (Note:  hospital course my include additional interventions and/or labs/studies not listed  above.)   Patient still with at least moderate if not severe respiratory distress after getting 2 DuoNeb's and Solu-Medrol  prehospital.  History of severe COPD on baseline supplemental oxygen  and the patient reports he continues to smoke.  No obvious infectious signs or symptoms at this time but is possible.  Anticipate lactic acid will be elevated as a result of his respiratory failure with hypoxia.  Checking VBG for possibility of hypercapnia although he is alert and oriented and it does not appear somnolent or confused.  Labs  pending, starting BiPAP given the persistent respiratory distress, continuing with another DuoNeb as well.  The patient is on the cardiac monitor to evaluate for evidence of arrhythmia and/or significant heart rate changes.   Clinical Course as of 04/16/24 9374  [place] Apr 16, 2024  0408 pCO2, Ven(!): 68 [CF]  9590 Patient is quite hypertensive I am ordering 1 inch of nitroglycerin  paste to try to reduce preload while awaiting lab results [CF]  0414 DG Chest [facility] I independently viewed and interpreted the patient's chest x-ray and I see no obvious pulmonary edema.  Radiologist is concerned about a worsening air space opacity compared to about 3 weeks ago when he was previously admitted [CF]  0414 Lactic Acid, Venous: 0.8 [CF]  0434 B Natriuretic Peptide: 50.9 though not definitive, this is reassuring an not diagnostic of CHF exacerbation  [CF]  0435 The patient has no signs or symptoms that strongly suggest an infectious process, but the chest x-ray questions whether there is a worsening infiltrate.  Rather than treat the patient with powerful broad-spectrum antibiotics unnecessarily, I have ordered a CT chest without contrast to further assess the area and determine if he does in fact have an ongoing infection requiring antibiotic treatment. [CF]  [phone] I also ordered blood cultures to be on the safe side [CF]  0502 CBC is normal, no leukocytosis, lactic acid is  normal, metabolic panel is generally reassuring.  Highly doubt worsening pneumonia. [CF]  [id number] I reassessed the patient after his CT scan.  He is feeling much better and would like to come off of the BiPAP.  He is currently on 4 L of oxygen  by nasal cannula.  He is comfortable with the plan for admission and I have consulted the hospitalist. [CF]  [phone] I added on a procalcitonin for further restratification given the possibility of pneumonia. [CF]  [phone] I consulted by phone with the admitting hospitalist, and they will admit the patient - Dr. Lawence. [CF]  0615 CT Chest Wo Contrast Question of aspiration, no clear evidence of pneumonia [CF]    Clinical Course User Index [CF] Gordan Huxley, MD     FINAL CLINICAL IMPRESSION(S) / ED DIAGNOSES   Final diagnoses:  Acute respiratory failure with hypoxia and hypercapnia (HCC)  COPD exacerbation (HCC)     Rx / DC Orders   ED Discharge Orders     None        Note:  This document was prepared using Dragon voice recognition software and may include unintentional dictation errors.   Gordan Huxley, MD 04/16/24 [phone]

## 2024-04-16 NOTE — H&P (Signed)
 History and Physical    Colton Nguyen FMW:[id number] DOB: May 14, 1946 DOA: 04/16/2024  PCP: [name], FNP (Confirm with patient/family/NH records and if not entered, this has to be entered at [facility] point of entry) Patient coming from: Home  I have personally briefly reviewed patient's old medical records in [facility] Link  Chief Complaint: SOB  HPI: Colton Nguyen is a 78 y.o. 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  Symptoms started 4-5 days ago patient started to have exertional dyspnea, denied any cough no chest pains.  Denies any fever or chills.  He denied any cough or choking after eating or drink and denied any history of GERD or heartburn.  ED Course: Afebrile, borderline tachycardia blood pressure elevated was 200/86 and improved to 160/80 O2 saturation 100% on BiPAP, after BiPAP and off O2 saturation is 98% on room air.  CT chest without contrast showed main bronchus fluid and posterior segment of right lower lobe infiltrates indicating for ongoing aspiration.  VBG 7.3 9/68/55 blood work showed BUN 11 creatinine 0.9 bicarb 33K 3.7 WBC 9.2 hemoglobin 10.4.  Troponin negative x 2.  Patient was given IV Solu-Medrol  x 1 and Nitropaste x 1 in the ED.  Review of Systems: As per HPI otherwise 14 point review of systems negative.    Past Medical History:  Diagnosis Date   Acute respiratory failure with hypoxia (HCC) 01/28/2023   Anxiety    situational   Arthritis    BPH (benign prostatic hyperplasia)    CAD (coronary artery disease)    CHF (congestive heart failure) (HCC)    pt denies 11/13/2019   Chronic cardiopulmonary disease (HCC)    COPD (chronic obstructive  pulmonary disease) (HCC)    History of kidney stones    Hyperlipidemia    Hypertension    Hypertension 12/28/2016   lung ca dx'd 03/2015   Mass of lung    Nicotine  dependence    Personal history of kidney stones    Restless leg    Shortness of breath dyspnea    with exertion   Tuberculosis    Wears glasses     Past Surgical History:  Procedure Laterality Date   BACK SURGERY     BIOPSY  01/25/2020   Procedure: BIOPSY;  Surgeon: Legrand Victory LITTIE DOUGLAS, MD;  Location: [facility];  Service: Gastroenterology;;   CAD CCTA 01/31/15     CERVICAL FUSION     COLONOSCOPY     COLONOSCOPY WITH PROPOFOL  N/A 01/25/2020   Procedure: COLONOSCOPY WITH PROPOFOL ;  Surgeon: Legrand Victory LITTIE DOUGLAS, MD;  Location: [facility];  Service: Gastroenterology;  Laterality: N/A;   ESOPHAGOGASTRODUODENOSCOPY (EGD) WITH PROPOFOL  N/A 01/25/2020   Procedure: ESOPHAGOGASTRODUODENOSCOPY (EGD) WITH PROPOFOL ;  Surgeon: Legrand Victory LITTIE DOUGLAS, MD;  Location: [facility];  Service: Gastroenterology;  Laterality: N/A;   FRACTURE SURGERY     left ankle   HEMOSTASIS CLIP PLACEMENT  01/25/2020   Procedure: HEMOSTASIS CLIP PLACEMENT;  Surgeon: Legrand Victory LITTIE DOUGLAS, MD;  Location: [facility];  Service: Gastroenterology;;   HOT HEMOSTASIS N/A 01/25/2020   Procedure: HOT HEMOSTASIS (ARGON PLASMA COAGULATION/BICAP);  Surgeon: Legrand Victory LITTIE DOUGLAS, MD;  Location: [facility];  Service: Gastroenterology;  Laterality: N/A;  LUNG BIOPSY N/A 02/27/2015   Procedure: LUNG BIOPSY;  Surgeon: Dallas KATHEE Jude, MD;  Location: [facility] OR;  Service: Thoracic;  Laterality: N/A;   right inguinal hernia repair at age 26     VIDEO BRONCHOSCOPY WITH ENDOBRONCHIAL ULTRASOUND N/A 02/27/2015   Procedure: VIDEO BRONCHOSCOPY WITH ENDOBRONCHIAL ULTRASOUND;  Surgeon: Dallas KATHEE Jude, MD;  Location: [facility] OR;  Service: Thoracic;  Laterality: N/A;     reports that he has been smoking cigarettes. He started smoking about 57 years ago. He has a 85.8 pack-year smoking  history. He has been exposed to tobacco smoke. He has never used smokeless tobacco. He reports current alcohol use. He reports that he does not use drugs.  No Known Allergies  Family History  Problem Relation Age of Onset   Cancer Other    Heart disease Father    Lung cancer Father    Hypertension Other    Diabetes Mother    Valvular heart disease Mother    Diabetes Sister    Colon polyps Maternal Uncle    Diabetes Sister    Diabetes Sister    Diabetes Niece    Colon cancer Cousin    Esophageal cancer Neg Hx    Stomach cancer Neg Hx    Pancreatic cancer Neg Hx      Prior to Admission medications   Medication Sig Start Date End Date Taking? Authorizing Provider  acetaminophen  (TYLENOL ) 500 MG tablet Take 1,000 mg by mouth every 6 (six) hours as needed for moderate pain or headache.   Yes [provider]  albuterol  (VENTOLIN  HFA) 108 (90 Base) MCG/ACT inhaler INHALE ONE TO TWO PUFFS BY MOUTH EVERY FOUR TO SIX HOURS AS NEEDED FOR SHORTNESS OF BREATH/WHEEZING 10/25/23  Yes [name], FNP  aspirin  81 MG tablet Take 81 mg by mouth daily.   Yes [provider]  atorvastatin  (LIPITOR) 40 MG tablet Take 1 tablet (40 mg total) by mouth daily. 12/14/23  Yes [name], FNP  azelastine  (ASTELIN ) 0.1 % nasal spray Place 1 spray into both nostrils 2 (two) times daily. 12/14/23  Yes [name], FNP  buPROPion  (WELLBUTRIN  SR) 150 MG 12 hr tablet Take 1 tablet (150 mg total) by mouth 2 (two) times daily. 04/14/24 04/14/25 Yes Scoggins, Amber, NP  Cholecalciferol  (VITAMIN D3) 5000 UNITS TABS Take 5,000 Units by mouth daily.   Yes [provider]  cyclobenzaprine  (FLEXERIL ) 5 MG tablet TAKE ONE TABLET (5 MG TOTAL) BY MOUTH THREE TIMES DAILY AS NEEDED FOR MUSCLE SPASMS. 11/30/23  Yes [name], FNP  ferrous sulfate  325 (65 FE) MG tablet Take 1 tablet (325 mg total) by mouth daily with breakfast. 12/14/23  Yes [name], FNP  finasteride  (PROSCAR ) 5  MG tablet Take 1 tablet (5 mg total) by mouth daily. 12/14/23  Yes [name], FNP  fluticasone  (FLONASE ) 50 MCG/ACT nasal spray Place 2 sprays into both nostrils daily as needed for allergies.  04/02/15  Yes [provider]  Fluticasone -Umeclidin-Vilant (TRELEGY ELLIPTA ) 100-62.5-25 MCG/ACT AEPB Inhale 1 puff into the lungs daily. 12/14/23  Yes [name], FNP  furosemide  (LASIX ) 20 MG tablet Take 1 tablet (20 mg total) by mouth daily. 04/14/24  Yes Scoggins, Amber, NP  lidocaine  (LIDODERM ) 5 % Place 1 patch onto the skin daily for 10 days. Remove & Discard patch within 12 hours or as directed by MD 04/14/24 04/24/24 Yes Scoggins, Amber, NP  LORazepam  (ATIVAN ) 0.5 MG tablet Take 0.5 mg by  mouth daily as needed for anxiety.   Yes [provider]  losartan  (COZAAR ) 50 MG tablet Take 1 tablet (50 mg total) by mouth daily. 04/05/24  Yes Dezii, Alexandra, DO  nicotine  (NICODERM CQ  - DOSED IN MG/24 HOURS) 21 mg/24hr patch Place 1 patch (21 mg total) onto the skin daily. 04/14/24 04/14/25 Yes Scoggins, Amber, NP  rOPINIRole  (REQUIP ) 0.25 MG tablet Take 1 tablet (0.25 mg total) by mouth 3 (three) times daily. 04/14/24  Yes Scoggins, Amber, NP  tamsulosin  (FLOMAX ) 0.4 MG CAPS capsule Take 1 capsule (0.4 mg total) by mouth daily. 12/14/23  Yes [name], FNP  traMADol  (ULTRAM ) 50 MG tablet Take 1 tablet (50 mg total) by mouth every 12 (twelve) hours as needed. 02/14/24  Yes [name], FNP    Physical Exam: Vitals:   04/16/24 0530 04/16/24 0600 04/16/24 0615 04/16/24 0652  BP: 137/70 (!) 168/87    Pulse: 97 (!) 101 (!) 101   Resp: (!) 21 (!) 26 (!) 24   Temp:      TempSrc:    Oral  SpO2: 100% 100% 98%   Weight:      Height:        Constitutional: NAD, calm, comfortable Vitals:   04/16/24 0530 04/16/24 0600 04/16/24 0615 04/16/24 0652  BP: 137/70 (!) 168/87    Pulse: 97 (!) 101 (!) 101   Resp: (!) 21 (!) 26 (!) 24   Temp:      TempSrc:    Oral  SpO2: 100% 100% 98%    Weight:      Height:       Eyes: PERRL, lids and conjunctivae normal ENMT: Mucous membranes are moist. Posterior pharynx clear of any exudate or lesions.Normal dentition.  Neck: normal, supple, no masses, no thyromegaly Respiratory: clear to auscultation bilaterally, no wheezing, bilateral lower fields crackles, increased respiratory effort. No accessory muscle use.  Cardiovascular: Regular rate and rhythm, no murmurs / rubs / gallops. No extremity edema. 2+ pedal pulses. No carotid bruits.  Abdomen: no tenderness, no masses palpated. No hepatosplenomegaly. Bowel sounds positive.  Musculoskeletal: no clubbing / cyanosis. No joint deformity upper and lower extremities. Good ROM, no contractures. Normal muscle tone.  Skin: no rashes, lesions, ulcers. No induration Neurologic: CN 2-12 grossly intact. Sensation intact, DTR normal. Strength 5/5 in all 4.  Psychiatric: Normal judgment and insight. Alert and oriented x 3. Normal mood.    Labs on Admission: I have personally reviewed following labs and imaging studies  CBC: Recent Labs  Lab 04/16/24 0433  WBC 9.3  NEUTROABS 8.1*  HGB 10.4*  HCT 34.3*  MCV 80.0  PLT 316   Basic Metabolic Panel: Recent Labs  Lab 04/16/24 0333  NA 134*  K 3.7  CL 94*  CO2 33*  GLUCOSE 117*  BUN 11  CREATININE 0.94  CALCIUM  8.5*   GFR: Estimated Creatinine Clearance: 58.6 mL/min (by C-G formula based on SCr of 0.94 mg/dL). Liver Function Tests: Recent Labs  Lab 04/16/24 0333  AST 20  ALT 18  ALKPHOS 102  BILITOT 0.5  PROT 6.8  ALBUMIN  3.5   Recent Labs  Lab 04/16/24 0333  LIPASE 30   No results for input(s): AMMONIA in the last 168 hours. Coagulation Profile: No results for input(s): INR, PROTIME in the last 168 hours. Cardiac Enzymes: No results for input(s): CKTOTAL, CKMB, CKMBINDEX, TROPONINI in the last 168 hours. BNP (last 3 results) No results for input(s): PROBNP in the last 8760 hours. HbA1C:  No results  for input(s): HGBA1C in the last 72 hours. CBG: No results for input(s): GLUCAP in the last 168 hours. Lipid Profile: No results for input(s): CHOL, HDL, LDLCALC, TRIG, CHOLHDL, LDLDIRECT in the last 72 hours. Thyroid Function Tests: No results for input(s): TSH, T4TOTAL, FREET4, T3FREE, THYROIDAB in the last 72 hours. Anemia Panel: No results for input(s): VITAMINB12, FOLATE, FERRITIN, TIBC, IRON, RETICCTPCT in the last 72 hours. Urine analysis:    Component Value Date/Time   COLORURINE STRAW (A) 04/01/2024 1136   APPEARANCEUR CLEAR (A) 04/01/2024 1136   LABSPEC 1.006 04/01/2024 1136   PHURINE 8.0 04/01/2024 1136   GLUCOSEU NEGATIVE 04/01/2024 1136   HGBUR NEGATIVE 04/01/2024 1136   BILIRUBINUR NEGATIVE 04/01/2024 1136   KETONESUR NEGATIVE 04/01/2024 1136   PROTEINUR NEGATIVE 04/01/2024 1136   NITRITE NEGATIVE 04/01/2024 1136   LEUKOCYTESUR NEGATIVE 04/01/2024 1136    Radiological Exams on Admission: CT Chest Wo Contrast Result Date: 04/16/2024 CLINICAL DATA:  Respiratory illness. Nondiagnostic x-ray. COPD history. EXAM: CT CHEST WITHOUT CONTRAST TECHNIQUE: Multidetector CT imaging of the chest was performed following the standard protocol without IV contrast. RADIATION DOSE REDUCTION: This exam was performed according to the departmental dose-optimization program which includes automated exposure control, adjustment of the mA and/or kV according to patient size and/or use of iterative reconstruction technique. COMPARISON:  Portable chest today, portable chest and CTA chest both 03/30/2024, chest CT with contrast 07/21/2023, and chest CT with contrast 07/09/2022. FINDINGS: Cardiovascular: The cardiac size is normal. Coronary arteries are heavily calcified. No pericardial effusion is seen. There is prominence of the central pulmonary arteries and trunk with the main pulmonary artery 3.1 cm, unchanged. The pulmonary veins are normal caliber. There is a  mildly tortuous aorta with moderate atherosclerosis, scattered calcification in the great vessels. No aortic aneurysm. Mediastinum/Nodes: Stable CT appearance of a multinodular goiter, displacing the trachea to the right. No intrathoracic adenopathy is seen without contrast. There is a patulous esophagus with normal wall thickness. Unremarkable trachea except for retained secretions in the distal lumen. Axillary spaces are clear. Lungs/Pleura: There is fluid in the right main bronchus and scattered right lower lobe subsegmental bronchial impactions. Patchy airspace disease continues to be seen in the posterior basal right lower lobe with interval development of a small layering right pleural effusion. Findings are most likely due to ongoing aspiration. The airspace disease was first seen on the last CT, not present earlier. There is a stable appearance of post treatment fibrotic consolidation, volume loss and cystic changes in the right apex extending to the upper hilum. There is moderate to advanced emphysematous disease with centrilobular changes predominating. There is a stable 5 mm subpleural left upper lobe nodule on 4:53, stable 7 mm pleural-based left lower lobe nodule on 4:127. No new nodule is seen. No left pleural fluid. Upper Abdomen: Adenomatous hyperplasia of both adrenal glands. No acute findings. Musculoskeletal: Osteopenia and degenerative change thoracic spine. No bone metastasis is seen. A lumbar fusion construct partially visible beginning at L3. IMPRESSION: 1. Fluid in the right main bronchus and scattered right lower lobe subsegmental bronchial impactions, with patchy airspace disease in the posterior basal right lower lobe. Findings are most likely due to ongoing aspiration. 2. Interval development of a small layering right pleural effusion. 3. Stable post treatment fibrotic changes in the right apex. 4. Stable 5 mm left upper lobe and 7 mm left lower lobe nodules. 5. Aortic and coronary artery  atherosclerosis. 6. Prominent central pulmonary arteries. 7. 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 8. Emphysema. Aortic Atherosclerosis (ICD10-I70.0) and Emphysema (ICD10-J43.9). Electronically Signed   By: Francis Quam M.D.   On: 04/16/2024 05:45   DG Chest Port 1 View Result Date: 04/16/2024 CLINICAL DATA:  Acute dyspnea. Shortness of breath. Pneumonia diagnosed 3-4 days ago. EXAM: PORTABLE CHEST 1 VIEW COMPARISON:  CTA chest 03/30/2024. FINDINGS: Coarsely nodular and hazy airspace disease in the right lung base appears slightly worsened from 03/30/2024. Right apical fibrotic changes and right-sided volume loss are chronically seen with emphysematous changes. No other focal infiltrate. Heart size and vascular markings are normal. The stable mediastinum with aortic atherosclerosis and uncoiling. No new osseous finding. Degenerative change thoracic spine. Multiple overlying telemetry leads. IMPRESSION: 1. Coarsely nodular and hazy airspace disease in the right lung base appears slightly worsened from 03/30/2024. 2. Right apical fibrotic changes and right-sided volume loss are chronically seen with emphysematous changes. Electronically Signed   By: Francis Quam M.D.   On: 04/16/2024 04:04    EKG: Independently reviewed.  Sinus rhythm, no acute ST changes.  Assessment/Plan Principal Problem:   COPD exacerbation (HCC) Active Problems:   Chronic respiratory failure with hypoxia, on home O2 therapy (HCC)   Aspiration pneumonia (HCC)   PNA (pneumonia)  (please populate well all problems here in Problem List. (For example, if patient is on BP meds at home and you resume or decide to hold them, it is a problem that needs to be her. Same for CAD, COPD, HLD and so on)  Acute on chronic hypoxic respiratory failure, improving, off BiPAP RLL PNA, recurrent likely aspiration in nature Acute COPD exacerbation - Reconsult speech therapist - Unasyn  - Short course of p.o. prednisone  - Incentive  spirometry, flutter valve - Aspiration precaution  Acute on chronic HFpEF decompensation HTN emergency - IV Lasix  x 1 then switch back to p.o. Lasix  tomorrow - Echocardiogram also done on last admission, will not repeat - Resume home BP meds including losartan , p.o. Lasix  - Other DDx, troponin negative x 2 and EKG showed nonischemic ST changes, ACS ruled out.  Outpatient cardiology follow-up for stress test.  BPH - Continue Flomax  and Proscar   Cigarette smoking - Cessation education performed at bedside - Continue nicotine  patch therapy  Total time spent on patient care 55 minutes  DVT prophylaxis: Lovenox  Code Status: Full code Family Communication: None at bedside Disposition Plan: Patient is sick with recurrent pneumonia and COPD exacerbation requiring BiPAP and concurrent CHF decompensation requiring IV diuresis, expect more than 2 midnight hospital stay Consults called: None Admission status: Telemetry admission   Cort ONEIDA Mana MD [facility] Pager [phone]  04/16/2024, 8:47 AM

## 2024-04-17 DIAGNOSIS — J441 Chronic obstructive pulmonary disease with (acute) exacerbation: Secondary | ICD-10-CM | POA: Diagnosis not present

## 2024-04-17 NOTE — Progress Notes (Signed)
 PROGRESS NOTE    Colton Nguyen   FMW:[id number] DOB: Mar 10, 1946  DOA: 04/16/2024 Date of Service: 04/17/24 which is hospital day 1  PCP: [name], [facility] course / significant events:   HPI: Colton Nguyen is a 78 y.o. male with medical history significant of COPD with chronic hypoxic respite failure on 2 L via nasal cannula as needed for shortness of breath, cigarette smoking, remote history of squamous cell lung cancer in 2016, HTN, chronic HFpEF, HLD, BPH, CAD. He presents to ED 04/16/24 with worsening of shortness of breath and wheezing x4-5 days w/ exertional dyspnea   08/10: to ED< hypertensive 200/86 improved to 160/80, requiring BiPAP briefly then off this and onto RA/O2 2L. CT chest without contrast showed main bronchus fluid and posterior segment of right lower lobe infiltrates indicating for ongoing aspiration. Troponin neg x2. Admitted to hospitalist.  08/11: remains on O2 occasionally needing up to 4L, continue abx and steroids, assess O2 on ambulation      Consultants:  none  Procedures/Surgeries: none      ASSESSMENT & PLAN:   Acute on chronic hypoxic respiratory failure, improving, off BiPAP RLL PNA, recurrent likely aspiration in nature Acute COPD exacerbation - speech therapist - Unasyn  - Short course of p.o. prednisone  - Incentive spirometry, flutter valve - Aspiration precaution   Acute on chronic HFpEF decompensation HTN emergency CAD w/ ACS ruled out  - IV Lasix  x 1 then switch back to p.o. Lasix  tomorrow - Echocardiogram also done on last admission, will not repeat - Resume home BP meds including losartan , p.o. Lasix  - Outpatient cardiology follow-up for stress test. - strict I&O   BPH - Continue Flomax  and Proscar    Cigarette smoking - Cessation education completed - Continue nicotine  patch therapy    No concerns based on BMI: Body mass index is 21.12 kg/m.SABRA Significantly low or high BMI is associated  with higher medical risk.  Underweight - under 18  overweight - 25 to 29 obese - 30 or more Class 1 obesity: BMI of 30.0 to 34 Class 2 obesity: BMI of 35.0 to 39 Class 3 obesity: BMI of 40.0 to 49 Super Morbid Obesity: BMI 50-59 Super-super Morbid Obesity: BMI 60+ Healthy nutrition and physical activity advised as adjunct to other disease management and risk reduction treatments    DVT prophylaxis: lovenox  IV fluids: no continuous IV fluids  Nutrition: cardiac diet Central lines / other devices: none  Code Status: FULL CODE ACP documentation reviewed:  none on file in [facility]YNCA  TOC needs: TBD may need increased home O2 rx  Medical barriers to dispo: O2 requirement above baseline. Expected medical readiness for discharge 1-2 days.              Subjective / Brief ROS:  Patient reports feeling abit ebtter this morning Denies CP/SOB at rest, has not really been ambulating  Pain controlled.  Denies new weakness.  Tolerating diet.  Reports no concerns w/ urination/defecation.   Family Communication: none at this time     Objective Findings:  Vitals:   04/17/24 0350 04/17/24 0406 04/17/24 0708 04/17/24 0955  BP: 121/80 131/73  134/78  Pulse: 81 83  89  Resp:    16  Temp: 99 F (37.2 C) 98.4 F (36.9 C)  98.8 F (37.1 C)  TempSrc:  Oral    SpO2: 97% 98% 98% 98%  Weight:      Height:        Intake/Output Summary (  Last 24 hours) at 04/17/2024 1258 Last data filed at 04/17/2024 1238 Gross per 24 hour  Intake 720 ml  Output 700 ml  Net 20 ml   Filed Weights   04/16/24 0325  Weight: 63 kg    Examination:  Physical Exam Constitutional:      General: He is not in acute distress. Cardiovascular:     Rate and Rhythm: Normal rate and regular rhythm.  Pulmonary:     Breath sounds: Decreased breath sounds and wheezing present.  Abdominal:     Palpations: Abdomen is soft.  Musculoskeletal:     Right lower leg: No edema.     Left lower leg: No edema.   Neurological:     Mental Status: He is alert and oriented to person, place, and time.  Psychiatric:        Mood and Affect: Mood normal.        Behavior: Behavior normal.          Scheduled Medications:   aspirin  EC  81 mg Oral Daily   atorvastatin   40 mg Oral Daily   azelastine   1 spray Each Nare BID   budesonide -glycopyrrolate -formoterol   2 puff Inhalation BID   buPROPion   150 mg Oral BID   enoxaparin  (LOVENOX ) injection  40 mg Subcutaneous Q24H   finasteride   5 mg Oral Daily   furosemide   20 mg Oral Daily   guaiFENesin   1,200 mg Oral BID   ipratropium-albuterol   3 mL Nebulization Q6H   losartan   50 mg Oral Daily   nicotine   21 mg Transdermal Q24H   pantoprazole   40 mg Oral BID AC   predniSONE   40 mg Oral Q breakfast   rOPINIRole   0.25 mg Oral TID   tamsulosin   0.4 mg Oral Daily    Continuous Infusions:  ampicillin -sulbactam (UNASYN ) IV 3 g (04/17/24 0941)    PRN Medications:  albuterol , cyclobenzaprine , fluticasone , LORazepam , traMADol   Antimicrobials from admission:  Anti-infectives (From admission, onward)    Start     Dose/Rate Route Frequency Ordered Stop   04/16/24 1000  Ampicillin -Sulbactam (UNASYN ) 3 g in sodium chloride  0.9 % 100 mL IVPB        3 g 200 mL/hr over 30 Minutes Intravenous Every 6 hours 04/16/24 0830             Data Reviewed:  I have personally reviewed the following...  CBC: Recent Labs  Lab 04/16/24 0433  WBC 9.3  NEUTROABS 8.1*  HGB 10.4*  HCT 34.3*  MCV 80.0  PLT 316   Basic Metabolic Panel: Recent Labs  Lab 04/16/24 0333  NA 134*  K 3.7  CL 94*  CO2 33*  GLUCOSE 117*  BUN 11  CREATININE 0.94  CALCIUM  8.5*   GFR: Estimated Creatinine Clearance: 58.6 mL/min (by C-G formula based on SCr of 0.94 mg/dL). Liver Function Tests: Recent Labs  Lab 04/16/24 0333  AST 20  ALT 18  ALKPHOS 102  BILITOT 0.5  PROT 6.8  ALBUMIN  3.5   Recent Labs  Lab 04/16/24 0333  LIPASE 30   No results for input(s):  AMMONIA in the last 168 hours. Coagulation Profile: No results for input(s): INR, PROTIME in the last 168 hours. Cardiac Enzymes: No results for input(s): CKTOTAL, CKMB, CKMBINDEX, TROPONINI in the last 168 hours. BNP (last 3 results) No results for input(s): PROBNP in the last 8760 hours. HbA1C: No results for input(s): HGBA1C in the last 72 hours. CBG: No results for input(s): GLUCAP in the last  168 hours. Lipid Profile: No results for input(s): CHOL, HDL, LDLCALC, TRIG, CHOLHDL, LDLDIRECT in the last 72 hours. Thyroid Function Tests: No results for input(s): TSH, T4TOTAL, FREET4, T3FREE, THYROIDAB in the last 72 hours. Anemia Panel: No results for input(s): VITAMINB12, FOLATE, FERRITIN, TIBC, IRON, RETICCTPCT in the last 72 hours. Most Recent Urinalysis On File:     Component Value Date/Time   COLORURINE STRAW (A) 04/01/2024 1136   APPEARANCEUR CLEAR (A) 04/01/2024 1136   LABSPEC 1.006 04/01/2024 1136   PHURINE 8.0 04/01/2024 1136   GLUCOSEU NEGATIVE 04/01/2024 1136   HGBUR NEGATIVE 04/01/2024 1136   BILIRUBINUR NEGATIVE 04/01/2024 1136   KETONESUR NEGATIVE 04/01/2024 1136   PROTEINUR NEGATIVE 04/01/2024 1136   NITRITE NEGATIVE 04/01/2024 1136   LEUKOCYTESUR NEGATIVE 04/01/2024 1136   Sepsis Labs: @LABRCNTIP (procalcitonin:4,lacticidven:4) Microbiology: Recent Results (from the past 240 hours)  Blood Culture (routine x 2)     Status: None (Preliminary result)   Collection Time: 04/16/24  4:33 AM   Specimen: BLOOD  Result Value Ref Range Status   Specimen Description BLOOD BLOOD RIGHT ARM  Final   Special Requests   Final    BOTTLES DRAWN AEROBIC AND ANAEROBIC Blood Culture adequate volume   Culture   Final    NO GROWTH < 24 HOURS Performed at [facility], [address], [place], [place] [address]    Report Status PENDING  Incomplete  Blood Culture (routine x 2)     Status: None (Preliminary result)    Collection Time: 04/16/24  4:33 AM   Specimen: BLOOD  Result Value Ref Range Status   Specimen Description BLOOD BLOOD LEFT ARM  Final   Special Requests   Final    BOTTLES DRAWN AEROBIC AND ANAEROBIC Blood Culture adequate volume   Culture   Final    NO GROWTH < 24 HOURS Performed at [facility], [address] [place], [place] [address]    Report Status PENDING  Incomplete      Radiology Studies last 3 days: CT Chest Wo Contrast Result Date: 04/16/2024 CLINICAL DATA:  Respiratory illness. Nondiagnostic x-ray. COPD history. EXAM: CT CHEST WITHOUT CONTRAST TECHNIQUE: Multidetector CT imaging of the chest was performed following the standard protocol without IV contrast. RADIATION DOSE REDUCTION: This exam was performed according to the departmental dose-optimization program which includes automated exposure control, adjustment of the mA and/or kV according to patient size and/or use of iterative reconstruction technique. COMPARISON:  Portable chest today, portable chest and CTA chest both 03/30/2024, chest CT with contrast 07/21/2023, and chest CT with contrast 07/09/2022. FINDINGS: Cardiovascular: The cardiac size is normal. Coronary arteries are heavily calcified. No pericardial effusion is seen. There is prominence of the central pulmonary arteries and trunk with the main pulmonary artery 3.1 cm, unchanged. The pulmonary veins are normal caliber. There is a mildly tortuous aorta with moderate atherosclerosis, scattered calcification in the great vessels. No aortic aneurysm. Mediastinum/Nodes: Stable CT appearance of a multinodular goiter, displacing the trachea to the right. No intrathoracic adenopathy is seen without contrast. There is a patulous esophagus with normal wall thickness. Unremarkable trachea except for retained secretions in the distal lumen. Axillary spaces are clear. Lungs/Pleura: There is fluid in the right main bronchus and scattered right lower lobe subsegmental  bronchial impactions. Patchy airspace disease continues to be seen in the posterior basal right lower lobe with interval development of a small layering right pleural effusion. Findings are most likely due to ongoing aspiration. The airspace disease was first seen  on the last CT, not present earlier. There is a stable appearance of post treatment fibrotic consolidation, volume loss and cystic changes in the right apex extending to the upper hilum. There is moderate to advanced emphysematous disease with centrilobular changes predominating. There is a stable 5 mm subpleural left upper lobe nodule on 4:53, stable 7 mm pleural-based left lower lobe nodule on 4:127. No new nodule is seen. No left pleural fluid. Upper Abdomen: Adenomatous hyperplasia of both adrenal glands. No acute findings. Musculoskeletal: Osteopenia and degenerative change thoracic spine. No bone metastasis is seen. A lumbar fusion construct partially visible beginning at L3. IMPRESSION: 1. Fluid in the right main bronchus and scattered right lower lobe subsegmental bronchial impactions, with patchy airspace disease in the posterior basal right lower lobe. Findings are most likely due to ongoing aspiration. 2. Interval development of a small layering right pleural effusion. 3. Stable post treatment fibrotic changes in the right apex. 4. Stable 5 mm left upper lobe and 7 mm left lower lobe nodules. 5. Aortic and coronary artery atherosclerosis. 6. Prominent central pulmonary arteries. 7. 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 8. Emphysema. Aortic Atherosclerosis (ICD10-I70.0) and Emphysema (ICD10-J43.9). Electronically Signed   By: Francis Quam M.D.   On: 04/16/2024 05:45   DG Chest Port 1 View Result Date: 04/16/2024 CLINICAL DATA:  Acute dyspnea. Shortness of breath. Pneumonia diagnosed 3-4 days ago. EXAM: PORTABLE CHEST 1 VIEW COMPARISON:  CTA chest 03/30/2024. FINDINGS: Coarsely nodular and hazy airspace disease in the right lung base  appears slightly worsened from 03/30/2024. Right apical fibrotic changes and right-sided volume loss are chronically seen with emphysematous changes. No other focal infiltrate. Heart size and vascular markings are normal. The stable mediastinum with aortic atherosclerosis and uncoiling. No new osseous finding. Degenerative change thoracic spine. Multiple overlying telemetry leads. IMPRESSION: 1. Coarsely nodular and hazy airspace disease in the right lung base appears slightly worsened from 03/30/2024. 2. Right apical fibrotic changes and right-sided volume loss are chronically seen with emphysematous changes. Electronically Signed   By: Francis Quam M.D.   On: 04/16/2024 04:04         Laneta Blunt, DO [facility] 04/17/2024, 12:58 PM    Dictation software may have been used to generate the above note. Typos may occur and escape review in typed/dictated notes. Please contact Dr Blunt directly for clarity if needed.  Staff may message me via secure chat in Epic  but this may not receive an immediate response,  please page me for urgent matters!  If 7PM-7AM, please contact night coverage [email or web address]

## 2024-04-17 NOTE — Hospital Course (Addendum)
 Hospital course / significant events:   HPI: Colton Nguyen is a 78 y.o. male with medical history significant of COPD with chronic hypoxic respite failure on 2 L via nasal cannula as needed for shortness of breath, cigarette smoking, remote history of squamous cell lung cancer in 2016, HTN, chronic HFpEF, HLD, BPH, CAD. He presents to ED 04/16/24 with worsening of shortness of breath and wheezing x4-5 days w/ exertional dyspnea   08/10: to ED< hypertensive 200/86 improved to 160/80, requiring BiPAP briefly then off this and onto RA/O2 2L. CT chest without contrast showed main bronchus fluid and posterior segment of right lower lobe infiltrates indicating for ongoing aspiration. Troponin neg x2. Admitted to hospitalist.  08/11: remains on O2 occasionally needing up to 4L, continue abx and steroids, assess O2 on ambulation  08/12: remains very SOB on ambulation and generally weak, PT/OT recs for SNF rehab [facility] consulted      Consultants:  none  Procedures/Surgeries: none      ASSESSMENT & PLAN:   Acute on chronic hypoxic respiratory failure, improving, off BiPAP RLL PNA, recurrent likely aspiration in nature Acute COPD exacerbation speech therapist Unasyn  Short course of p.o. prednisone  Incentive spirometry, flutter valve Aspiration precaution   Acute on chronic HFpEF decompensation HTN emergency CAD w/ ACS ruled out  p.o. Lasix   Echocardiogram also done on last admission, will not repeat Resume home BP meds including losartan , p.o. Lasix  Outpatient cardiology follow-up for stress test. strict I&O   BPH Continue Flomax  and Proscar    Cigarette smoking Cessation education completed Continue nicotine  patch therapy    No concerns based on BMI: Body mass index is 21.12 kg/m.SABRA Significantly low or high BMI is associated with higher medical risk.  Underweight - under 18  overweight - 25 to 29 obese - 30 or more Class 1 obesity: BMI of 30.0 to 34 Class 2 obesity:  BMI of 35.0 to 39 Class 3 obesity: BMI of 40.0 to 49 Super Morbid Obesity: BMI 50-59 Super-super Morbid Obesity: BMI 60+ Healthy nutrition and physical activity advised as adjunct to other disease management and risk reduction treatments    DVT prophylaxis: lovenox  IV fluids: no continuous IV fluids  Nutrition: cardiac diet Central lines / other devices: none  Code Status: FULL CODE ACP documentation reviewed:  none on file in VYNCA  TOC needs: may need increased home O2 rx, SNF rehab  Medical barriers to dispo: O2 requirement above baseline. Expected medical readiness for discharge tomorrow

## 2024-04-17 NOTE — Plan of Care (Signed)
 Patient remains free from any noted signs of acute distress.  Continues to have O2, @ 2L, via ,[place], continuous.  Lung fields remain clear throughout, but diminished.  Patient has not had any noted cough during night shift.  No additional medical interventions required patient to continue to be monitored by hospital staff until discharged.

## 2024-04-18 DIAGNOSIS — J441 Chronic obstructive pulmonary disease with (acute) exacerbation: Secondary | ICD-10-CM | POA: Diagnosis not present

## 2024-04-18 LAB — CBC
HCT: 30.4 % — ABNORMAL LOW (ref 39.0–52.0)
Hemoglobin: 9.5 g/dL — ABNORMAL LOW (ref 13.0–17.0)
MCH: 24.2 pg — ABNORMAL LOW (ref 26.0–34.0)
MCHC: 31.3 g/dL (ref 30.0–36.0)
MCV: 77.6 fL — ABNORMAL LOW (ref 80.0–100.0)
Platelets: 249 K/uL (ref 150–400)
RBC: 3.92 MIL/uL — ABNORMAL LOW (ref 4.22–5.81)
RDW: 18.2 % — ABNORMAL HIGH (ref 11.5–15.5)
WBC: 7.8 K/uL (ref 4.0–10.5)
nRBC: 0 % (ref 0.0–0.2)

## 2024-04-18 LAB — BASIC METABOLIC PANEL WITH GFR
Anion gap: 6 (ref 5–15)
BUN: 15 mg/dL (ref 8–23)
CO2: 35 mmol/L — ABNORMAL HIGH (ref 22–32)
Calcium: 8.6 mg/dL — ABNORMAL LOW (ref 8.9–10.3)
Chloride: 97 mmol/L — ABNORMAL LOW (ref 98–111)
Creatinine, Ser: 0.82 mg/dL (ref 0.61–1.24)
GFR, Estimated: >60 mL/min (ref >60)
Glucose, Bld: 82 mg/dL (ref 70–99)
Potassium: 3.6 mmol/L (ref 3.5–5.1)
Sodium: 138 mmol/L (ref 135–145)

## 2024-04-18 MED ORDER — IPRATROPIUM-ALBUTEROL 0.5-2.5 (3) MG/3ML IN SOLN
3.0000 mL | Freq: Two times a day (BID) | RESPIRATORY_TRACT | Status: DC
  Administered 2024-04-18 (×2): 3 mL via RESPIRATORY_TRACT
  Filled 2024-04-18 (×2): qty 3

## 2024-04-18 NOTE — Evaluation (Signed)
 Physical Therapy Evaluation Patient Details Name: Colton Nguyen MRN: [id number] DOB: 20-Jul-1946 Today's Date: 04/18/2024  History of Present Illness  Pt is a 77 y/o M who presented to ED with c/o SOB and wheezing. PMH signficant for COPD on 2L via [place] at baseline, HFpEF, squamous cell lung cancer in 2016, HTN, HLD, BPH, CAD.  Clinical Impression  Pt A&Ox3, disoriented to time, agreeable to PT evaluation. Pt reports that at baseline he is IND with IADLs and cites using RW for ambulation outside of home. Pt denies pain, reports SOB and dyspnea with exertion. Pt able to complete STS transfers with RW and CGA. Pt required intermittent minA for changing soiled brief and for [place] management when changing shirt/gown. Pt able to ambulate ~65ft with RW, CGA, and intermittent standing rest breaks to assess SpO2. SpO2 remained steady within 94-96% throughout ambulation, though pt had noticeable dyspnea with exertion and trembling in LEs that was exaggerated by increased ambulation distance. Pt would benefit from skilled PT intervention to address listed deficits (see PT problem list), improve his activity tolerance, and return to PLOF.       If plan is discharge home, recommend the following: A little help with walking and/or transfers;A little help with bathing/dressing/bathroom;Assistance with cooking/housework;Assist for transportation;Help with stairs or ramp for entrance   Can travel by private vehicle   Yes    Equipment Recommendations None recommended by PT  Recommendations for Other Services       Functional Status Assessment Patient has had a recent decline in their functional status and demonstrates the ability to make significant improvements in function in a reasonable and predictable amount of time.     Precautions / Restrictions Precautions Precautions: Fall Restrictions Weight Bearing Restrictions Per Provider Order: No      Mobility  Bed Mobility                     Transfers Overall transfer level: Needs assistance Equipment used: Rolling walker (2 wheels) Transfers: Sit to/from Stand Sit to Stand: Contact guard assist                Ambulation/Gait Ambulation/Gait assistance: Contact guard assist Gait Distance (Feet): 40 Feet Assistive device: Rolling walker (2 wheels) Gait Pattern/deviations: Step-through pattern, Decreased stride length, Narrow base of support Gait velocity: decreased     General Gait Details: noticeable trembling in LEs throughout amb and dyspnea/SOB on exertion. No LOB.  Stairs            Wheelchair Mobility     Tilt Bed    Modified Rankin (Stroke Patients Only)       Balance Overall balance assessment: Needs assistance Sitting-balance support: Feet supported Sitting balance-Leahy Scale: Good     Standing balance support: Bilateral upper extremity supported, Reliant on assistive device for balance, No upper extremity supported Standing balance-Leahy Scale: Fair Standing balance comment: intermittent UE support, more steady with UE support on RW                             Pertinent Vitals/Pain Pain Assessment Pain Assessment: No/denies pain    Home Living Family/patient expects to be discharged to:: Private residence Living Arrangements: Children Available Help at Discharge: Family;Available PRN/intermittently (unsure how much assistance his son can provide) Type of Home: House Home Access: Stairs to enter Entrance Stairs-Rails: Right;Left;Can reach both Entrance Stairs-Number of Steps: 5   Home Layout: One level Home Equipment: Agricultural consultant (  2 wheels);Cane - single point;Shower seat Additional Comments: Pt on 2Ls via [place] at home    Prior Function Prior Level of Function : Independent/Modified Independent             Mobility Comments: Pt reports using RW for amb outside of home, intermittently for home amb. Pt denies falls ADLs Comments: Pt reports being IND with  IADLs     Extremity/Trunk Assessment   Upper Extremity Assessment Upper Extremity Assessment: Defer to OT evaluation    Lower Extremity Assessment Lower Extremity Assessment: Generalized weakness (grossly 4/5 bilateral LEs)    Cervical / Trunk Assessment Cervical / Trunk Assessment: Normal  Communication   Communication Communication: No apparent difficulties    Cognition Arousal: Alert Behavior During Therapy: WFL for tasks assessed/performed   PT - Cognitive impairments: Orientation                       PT - Cognition Comments: A&Ox3; reported month as June Following commands: Intact       Cueing Cueing Techniques: Verbal cues     General Comments      Exercises Other Exercises Other Exercises: SpO2 monitored throughout session with pt on 2L supplemental: initially 96-97% at rest, 94% after ~75ft amb, 96% after additional ~6ft, 96% at end of session. Intermittent minA required to change brief/shirt/gown.   Assessment/Plan    PT Assessment Patient needs continued PT services  PT Problem List Decreased strength;Decreased activity tolerance;Decreased balance;Decreased mobility;Decreased coordination;Decreased safety awareness;Cardiopulmonary status limiting activity       PT Treatment Interventions DME instruction;Gait training;Stair training;Functional mobility training;Therapeutic activities;Therapeutic exercise;Balance training;Neuromuscular re-education;Patient/family education;Cognitive remediation    PT Goals (Current goals can be found in the Care Plan section)  Acute Rehab PT Goals Patient Stated Goal: return home PT Goal Formulation: With patient Time For Goal Achievement: 05/02/24 Potential to Achieve Goals: Fair    Frequency Min 2X/week     Co-evaluation               AM-PAC PT 6 Clicks Mobility  Outcome Measure Help needed turning from your back to your side while in a flat bed without using bedrails?: A Little Help  needed moving from lying on your back to sitting on the side of a flat bed without using bedrails?: A Little Help needed moving to and from a bed to a chair (including a wheelchair)?: A Little Help needed standing up from a chair using your arms (e.g., wheelchair or bedside chair)?: A Little Help needed to walk in hospital room?: A Little Help needed climbing 3-5 steps with a railing? : A Lot 6 Click Score: 17    End of Session Equipment Utilized During Treatment: Gait belt;Oxygen  Activity Tolerance: Patient tolerated treatment well Patient left: in chair;with call bell/phone within reach;with chair alarm set Nurse Communication: Mobility status PT Visit Diagnosis: Unsteadiness on feet (R26.81);Other abnormalities of gait and mobility (R26.89);Muscle weakness (generalized) (M62.81);Difficulty in walking, not elsewhere classified (R26.2)    Time: 8878-8856 PT Time Calculation (min) (ACUTE ONLY): 22 min   Charges:   PT Evaluation $PT Eval Low Complexity: 1 Low PT Treatments $Therapeutic Activity: 8-22 mins PT General Charges $$ ACUTE PT VISIT: 1 Visit         Janell Axe, SPT

## 2024-04-18 NOTE — Progress Notes (Signed)
 SPO2 on Room Air at rest 94% SpO2 on room air with ambulation 87/88%.

## 2024-04-18 NOTE — Progress Notes (Signed)
 PROGRESS NOTE    Colton Nguyen   FMW:[id number] DOB: [birth year]  DOA: 04/16/2024 Date of Service: 04/18/24 which is hospital day 2  PCP: [name], [facility] course / significant events:   HPI: Colton Nguyen is a 78 y.o. male with medical history significant of COPD with chronic hypoxic respite failure on 2 L via nasal cannula as needed for shortness of breath, cigarette smoking, remote history of squamous cell lung cancer in 2016, HTN, chronic HFpEF, HLD, BPH, CAD. He presents to ED 04/16/24 with worsening of shortness of breath and wheezing x4-5 days w/ exertional dyspnea   08/10: to ED< hypertensive 200/86 improved to 160/80, requiring BiPAP briefly then off this and onto RA/O2 2L. CT chest without contrast showed main bronchus fluid and posterior segment of right lower lobe infiltrates indicating for ongoing aspiration. Troponin neg x2. Admitted to hospitalist.  08/11: remains on O2 occasionally needing up to 4L, continue abx and steroids, assess O2 on ambulation  08/12: remains very SOB on ambulation and generally weak, PT/OT recs for SNF rehab [facility] consulted      Consultants:  none  Procedures/Surgeries: none      ASSESSMENT & PLAN:   Acute on chronic hypoxic respiratory failure, improving, off BiPAP RLL PNA, recurrent likely aspiration in nature Acute COPD exacerbation speech therapist Unasyn  Short course of p.o. prednisone  Incentive spirometry, flutter valve Aspiration precaution   Acute on chronic HFpEF decompensation HTN emergency CAD w/ ACS ruled out  p.o. Lasix   Echocardiogram also done on last admission, will not repeat Resume home BP meds including losartan , p.o. Lasix  Outpatient cardiology follow-up for stress test. strict I&O   BPH Continue Flomax  and Proscar    Cigarette smoking Cessation education completed Continue nicotine  patch therapy    No concerns based on BMI: Body mass index is 21.12 kg/m.SABRA Significantly  low or high BMI is associated with higher medical risk.  Underweight - under 18  overweight - 25 to 29 obese - 30 or more Class 1 obesity: BMI of 30.0 to 34 Class 2 obesity: BMI of 35.0 to 39 Class 3 obesity: BMI of 40.0 to 49 Super Morbid Obesity: BMI 50-59 Super-super Morbid Obesity: BMI 60+ Healthy nutrition and physical activity advised as adjunct to other disease management and risk reduction treatments    DVT prophylaxis: lovenox  IV fluids: no continuous IV fluids  Nutrition: cardiac diet Central lines / other devices: none  Code Status: FULL CODE ACP documentation reviewed:  none on file in [facility]  TOC needs: may need increased home O2 rx, SNF rehab  Medical barriers to dispo: O2 requirement above baseline. Expected medical readiness for discharge tomorrow              Subjective / Brief ROS:  Patient reports feeling about same as yesterday, SOB w/ minimal exertion Denies CP/SOB at rest Pain controlled.  Denies new weakness.  Tolerating diet.  Reports no concerns w/ urination/defecation.   Family Communication: sister at bedside this afternoon     Objective Findings:  Vitals:   04/18/24 0550 04/18/24 0705 04/18/24 0750 04/18/24 1552  BP: (!) 166/88  (!) 161/85 (!) 160/88  Pulse: 90  93 81  Resp: 18  18 18   Temp: 99.1 F (37.3 C)  98.8 F (37.1 C) 98.3 F (36.8 C)  TempSrc: Oral  Oral   SpO2: 93% 97% 96% 100%  Weight:      Height:        Intake/Output Summary (Last 24  hours) at 04/18/2024 1633 Last data filed at 04/18/2024 0900 Gross per 24 hour  Intake 600 ml  Output --  Net 600 ml   Filed Weights   04/16/24 0325  Weight: 63 kg    Examination:  Physical Exam Constitutional:      General: He is not in acute distress. Cardiovascular:     Rate and Rhythm: Normal rate and regular rhythm.  Pulmonary:     Breath sounds: Decreased breath sounds and wheezing present.  Abdominal:     Palpations: Abdomen is soft.  Musculoskeletal:      Right lower leg: No edema.     Left lower leg: No edema.  Neurological:     Mental Status: He is alert and oriented to person, place, and time.  Psychiatric:        Mood and Affect: Mood normal.        Behavior: Behavior normal.          Scheduled Medications:   aspirin  EC  81 mg Oral Daily   atorvastatin   40 mg Oral Daily   azelastine   1 spray Each Nare BID   budesonide -glycopyrrolate -formoterol   2 puff Inhalation BID   buPROPion   150 mg Oral BID   enoxaparin  (LOVENOX ) injection  40 mg Subcutaneous Q24H   finasteride   5 mg Oral Daily   furosemide   20 mg Oral Daily   guaiFENesin   1,200 mg Oral BID   ipratropium-albuterol   3 mL Nebulization BID   losartan   50 mg Oral Daily   nicotine   21 mg Transdermal Q24H   pantoprazole   40 mg Oral BID AC   predniSONE   40 mg Oral Q breakfast   rOPINIRole   0.25 mg Oral TID   tamsulosin   0.4 mg Oral Daily    Continuous Infusions:  ampicillin -sulbactam (UNASYN ) IV 3 g (04/18/24 1617)    PRN Medications:  albuterol , cyclobenzaprine , fluticasone , LORazepam , traMADol   Antimicrobials from admission:  Anti-infectives (From admission, onward)    Start     Dose/Rate Route Frequency Ordered Stop   04/16/24 1000  Ampicillin -Sulbactam (UNASYN ) 3 g in sodium chloride  0.9 % 100 mL IVPB        3 g 200 mL/hr over 30 Minutes Intravenous Every 6 hours 04/16/24 0830             Data Reviewed:  I have personally reviewed the following...  CBC: Recent Labs  Lab 04/16/24 0433 04/18/24 0442  WBC 9.3 7.8  NEUTROABS 8.1*  --   HGB 10.4* 9.5*  HCT 34.3* 30.4*  MCV 80.0 77.6*  PLT 316 249   Basic Metabolic Panel: Recent Labs  Lab 04/16/24 0333 04/18/24 0442  NA 134* 138  K 3.7 3.6  CL 94* 97*  CO2 33* 35*  GLUCOSE 117* 82  BUN 11 15  CREATININE 0.94 0.82  CALCIUM  8.5* 8.6*   GFR: Estimated Creatinine Clearance: 67.2 mL/min (by C-G formula based on SCr of 0.82 mg/dL). Liver Function Tests: Recent Labs  Lab 04/16/24 0333   AST 20  ALT 18  ALKPHOS 102  BILITOT 0.5  PROT 6.8  ALBUMIN  3.5   Recent Labs  Lab 04/16/24 0333  LIPASE 30   No results for input(s): AMMONIA in the last 168 hours. Coagulation Profile: No results for input(s): INR, PROTIME in the last 168 hours. Cardiac Enzymes: No results for input(s): CKTOTAL, CKMB, CKMBINDEX, TROPONINI in the last 168 hours. BNP (last 3 results) No results for input(s): PROBNP in the last 8760 hours. HbA1C: No results  for input(s): HGBA1C in the last 72 hours. CBG: No results for input(s): GLUCAP in the last 168 hours. Lipid Profile: No results for input(s): CHOL, HDL, LDLCALC, TRIG, CHOLHDL, LDLDIRECT in the last 72 hours. Thyroid Function Tests: No results for input(s): TSH, T4TOTAL, FREET4, T3FREE, THYROIDAB in the last 72 hours. Anemia Panel: No results for input(s): VITAMINB12, FOLATE, FERRITIN, TIBC, IRON, RETICCTPCT in the last 72 hours. Most Recent Urinalysis On File:     Component Value Date/Time   COLORURINE STRAW (A) 04/01/2024 1136   APPEARANCEUR CLEAR (A) 04/01/2024 1136   LABSPEC 1.006 04/01/2024 1136   PHURINE 8.0 04/01/2024 1136   GLUCOSEU NEGATIVE 04/01/2024 1136   HGBUR NEGATIVE 04/01/2024 1136   BILIRUBINUR NEGATIVE 04/01/2024 1136   KETONESUR NEGATIVE 04/01/2024 1136   PROTEINUR NEGATIVE 04/01/2024 1136   NITRITE NEGATIVE 04/01/2024 1136   LEUKOCYTESUR NEGATIVE 04/01/2024 1136   Sepsis Labs: @LABRCNTIP (procalcitonin:4,lacticidven:4) Microbiology: Recent Results (from the past 240 hours)  Blood Culture (routine x 2)     Status: None (Preliminary result)   Collection Time: 04/16/24  4:33 AM   Specimen: BLOOD  Result Value Ref Range Status   Specimen Description BLOOD BLOOD RIGHT ARM  Final   Special Requests   Final    BOTTLES DRAWN AEROBIC AND ANAEROBIC Blood Culture adequate volume   Culture   Final    NO GROWTH < 24 HOURS Performed at [facility], [address] [place], [place] [address]    Report Status PENDING  Incomplete  Blood Culture (routine x 2)     Status: None (Preliminary result)   Collection Time: 04/16/24  4:33 AM   Specimen: BLOOD  Result Value Ref Range Status   Specimen Description BLOOD BLOOD LEFT ARM  Final   Special Requests   Final    BOTTLES DRAWN AEROBIC AND ANAEROBIC Blood Culture adequate volume   Culture   Final    NO GROWTH < 24 HOURS Performed at [facility], [address] [place], [place] [address]    Report Status PENDING  Incomplete      Radiology Studies last 3 days: CT Chest Wo Contrast Result Date: 04/16/2024 CLINICAL DATA:  Respiratory illness. Nondiagnostic x-ray. COPD history. EXAM: CT CHEST WITHOUT CONTRAST TECHNIQUE: Multidetector CT imaging of the chest was performed following the standard protocol without IV contrast. RADIATION DOSE REDUCTION: This exam was performed according to the departmental dose-optimization program which includes automated exposure control, adjustment of the mA and/or kV according to patient size and/or use of iterative reconstruction technique. COMPARISON:  Portable chest today, portable chest and CTA chest both 03/30/2024, chest CT with contrast 07/21/2023, and chest CT with contrast 07/09/2022. FINDINGS: Cardiovascular: The cardiac size is normal. Coronary arteries are heavily calcified. No pericardial effusion is seen. There is prominence of the central pulmonary arteries and trunk with the main pulmonary artery 3.1 cm, unchanged. The pulmonary veins are normal caliber. There is a mildly tortuous aorta with moderate atherosclerosis, scattered calcification in the great vessels. No aortic aneurysm. Mediastinum/Nodes: Stable CT appearance of a multinodular goiter, displacing the trachea to the right. No intrathoracic adenopathy is seen without contrast. There is a patulous esophagus with normal wall thickness. Unremarkable trachea except for retained secretions in  the distal lumen. Axillary spaces are clear. Lungs/Pleura: There is fluid in the right main bronchus and scattered right lower lobe subsegmental bronchial impactions. Patchy airspace disease continues to be seen in the posterior basal right lower lobe with interval development of a small layering  right pleural effusion. Findings are most likely due to ongoing aspiration. The airspace disease was first seen on the last CT, not present earlier. There is a stable appearance of post treatment fibrotic consolidation, volume loss and cystic changes in the right apex extending to the upper hilum. There is moderate to advanced emphysematous disease with centrilobular changes predominating. There is a stable 5 mm subpleural left upper lobe nodule on 4:53, stable 7 mm pleural-based left lower lobe nodule on 4:127. No new nodule is seen. No left pleural fluid. Upper Abdomen: Adenomatous hyperplasia of both adrenal glands. No acute findings. Musculoskeletal: Osteopenia and degenerative change thoracic spine. No bone metastasis is seen. A lumbar fusion construct partially visible beginning at L3. IMPRESSION: 1. Fluid in the right main bronchus and scattered right lower lobe subsegmental bronchial impactions, with patchy airspace disease in the posterior basal right lower lobe. Findings are most likely due to ongoing aspiration. 2. Interval development of a small layering right pleural effusion. 3. Stable post treatment fibrotic changes in the right apex. 4. Stable 5 mm left upper lobe and 7 mm left lower lobe nodules. 5. Aortic and coronary artery atherosclerosis. 6. Prominent central pulmonary arteries. 7. 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 8. Emphysema. Aortic Atherosclerosis (ICD10-I70.0) and Emphysema (ICD10-J43.9). Electronically Signed   By: Francis Quam M.D.   On: 04/16/2024 05:45   DG Chest Port 1 View Result Date: 04/16/2024 CLINICAL DATA:  Acute dyspnea. Shortness of breath. Pneumonia diagnosed 3-4 days  ago. EXAM: PORTABLE CHEST 1 VIEW COMPARISON:  CTA chest 03/30/2024. FINDINGS: Coarsely nodular and hazy airspace disease in the right lung base appears slightly worsened from 03/30/2024. Right apical fibrotic changes and right-sided volume loss are chronically seen with emphysematous changes. No other focal infiltrate. Heart size and vascular markings are normal. The stable mediastinum with aortic atherosclerosis and uncoiling. No new osseous finding. Degenerative change thoracic spine. Multiple overlying telemetry leads. IMPRESSION: 1. Coarsely nodular and hazy airspace disease in the right lung base appears slightly worsened from 03/30/2024. 2. Right apical fibrotic changes and right-sided volume loss are chronically seen with emphysematous changes. Electronically Signed   By: Francis Quam M.D.   On: 04/16/2024 04:04         Laneta Blunt, DO [facility] 04/18/2024, 4:33 PM    Dictation software may have been used to generate the above note. Typos may occur and escape review in typed/dictated notes. Please contact Dr Blunt directly for clarity if needed.  Staff may message me via secure chat in Epic  but this may not receive an immediate response,  please page me for urgent matters!  If 7PM-7AM, please contact night coverage [email or web address]

## 2024-04-18 NOTE — Plan of Care (Signed)
  Problem: Education: Goal: Knowledge of General Education information will improve Description: Including pain rating scale, medication(s)/side effects and non-pharmacologic comfort measures Outcome: Progressing   Problem: Health Behavior/Discharge Planning: Goal: Ability to manage health-related needs will improve Outcome: Progressing   Problem: Clinical Measurements: Goal: Ability to maintain clinical measurements within normal limits will improve Outcome: Progressing Goal: Will remain free from infection Outcome: Progressing Goal: Diagnostic test results will improve Outcome: Progressing Goal: Respiratory complications will improve Outcome: Progressing Goal: Cardiovascular complication will be avoided Outcome: Progressing   Problem: Activity: Goal: Risk for activity intolerance will decrease Outcome: Progressing   Problem: Nutrition: Goal: Adequate nutrition will be maintained Outcome: Progressing   Problem: Coping: Goal: Level of anxiety will decrease Outcome: Progressing   Problem: Elimination: Goal: Will not experience complications related to bowel motility Outcome: Progressing Goal: Will not experience complications related to urinary retention Outcome: Progressing   Problem: Pain Managment: Goal: General experience of comfort will improve and/or be controlled Outcome: Progressing   Problem: Safety: Goal: Ability to remain free from injury will improve Outcome: Progressing   Problem: Education: Goal: Knowledge of disease or condition will improve Outcome: Progressing Goal: Knowledge of the prescribed therapeutic regimen will improve Outcome: Progressing Goal: Individualized Educational Video(s) Outcome: Progressing

## 2024-04-18 NOTE — Plan of Care (Signed)
 Pt alert and oriented, denies any c/o pain. Pt receiving IV antibiotic Ampicillin . Pt on 3L/[place] baseline is 2L/[place]. Pt has dyspnea with any exertion. Bed is low, locked with call light in reach. Problem: Education: Goal: Knowledge of General Education information will improve Description: Including pain rating scale, medication(s)/side effects and non-pharmacologic comfort measures Outcome: Progressing   Problem: Health Behavior/Discharge Planning: Goal: Ability to manage health-related needs will improve Outcome: Progressing   Problem: Clinical Measurements: Goal: Ability to maintain clinical measurements within normal limits will improve Outcome: Progressing Goal: Will remain free from infection Outcome: Progressing Goal: Diagnostic test results will improve Outcome: Progressing Goal: Respiratory complications will improve Outcome: Progressing Goal: Cardiovascular complication will be avoided Outcome: Progressing   Problem: Activity: Goal: Risk for activity intolerance will decrease Outcome: Progressing   Problem: Nutrition: Goal: Adequate nutrition will be maintained Outcome: Progressing   Problem: Coping: Goal: Level of anxiety will decrease Outcome: Progressing   Problem: Elimination: Goal: Will not experience complications related to bowel motility Outcome: Progressing Goal: Will not experience complications related to urinary retention Outcome: Progressing   Problem: Pain Managment: Goal: General experience of comfort will improve and/or be controlled Outcome: Progressing   Problem: Safety: Goal: Ability to remain free from injury will improve Outcome: Progressing   Problem: Skin Integrity: Goal: Risk for impaired skin integrity will decrease Outcome: Progressing   Problem: Education: Goal: Knowledge of disease or condition will improve Outcome: Progressing Goal: Knowledge of the prescribed therapeutic regimen will improve Outcome: Progressing Goal:  Individualized Educational Video(s) Outcome: Progressing   Problem: Activity: Goal: Ability to tolerate increased activity will improve Outcome: Progressing Goal: Will verbalize the importance of balancing activity with adequate rest periods Outcome: Progressing   Problem: Respiratory: Goal: Ability to maintain a clear airway will improve Outcome: Progressing Goal: Levels of oxygenation will improve Outcome: Progressing Goal: Ability to maintain adequate ventilation will improve Outcome: Progressing

## 2024-04-18 NOTE — TOC Initial Note (Signed)
 Transition of Care [facility]) - Initial/Assessment Note    Patient Details  Name: Colton Nguyen MRN: [id number] Date of Birth: [birth year]  [facility]) CM/SW Contact:    Dalia GORMAN Fuse, RN Phone Number: 04/18/2024, 7:34 AM  Clinical Narrative:                 Patient is from home and admitted with COPD exacerbation he is receiving IV ABX and IV Lasix . He is on home O2 at 4 L [place]. Patient was at [facility] for [employer]. Therapy recs are pending at this time.         Patient Goals and CMS Choice            Expected Discharge Plan and Services                                              Prior Living Arrangements/Services                       Activities of Daily Living   ADL Screening (condition at time of admission) Independently performs ADLs?: Yes (appropriate for developmental age) Is the patient deaf or have difficulty hearing?: No Does the patient have difficulty seeing, even when wearing glasses/contacts?: No Does the patient have difficulty concentrating, remembering, or making decisions?: No  Permission Sought/Granted                  Emotional Assessment              Admission diagnosis:  COPD exacerbation (HCC) [J44.1] PNA (pneumonia) [J18.9] Acute respiratory failure with hypoxia and hypercapnia (HCC) [J96.01, J96.02] Patient Active Problem List   Diagnosis Date Noted   Aspiration pneumonia (HCC) 04/16/2024   PNA (pneumonia) 04/16/2024   Restless leg syndrome 04/14/2024   Depression 04/14/2024   Protein-calorie malnutrition, severe 04/01/2024   COPD exacerbation (HCC) 03/30/2024   Prediabetes 01/30/2023   Chronic respiratory failure with hypoxia, on home O2 therapy (HCC) 01/28/2023   Iron deficiency anemia, unspecified 12/21/2019   Drug induced constipation 12/21/2019   Anxiety 12/21/2019   Tobacco use disorder 12/21/2019   Cognitive communication deficit 12/21/2019   Lumbar stenosis with neurogenic  claudication 11/14/2019   BPH (benign prostatic hyperplasia) 08/01/2018   Hypertension 12/28/2016   COPD (chronic obstructive pulmonary disease) (HCC) 12/28/2016   Encounter for nail care 06/03/2015   Smoking trying to quit 04/01/2015   Squamous cell carcinoma of lung, stage III 03/07/2015   spiculated right upper lobe mass 02/14/2015   PCP:  [name], FNP Pharmacy:   [facility] [place] [place] - [address] [place] [address] Phone: [phone] Fax: [phone]     Social Drivers of Health (SDOH) Social History: SDOH Screenings   Food Insecurity: No Food Insecurity (04/16/2024)  Housing: Low Risk  (04/16/2024)  Transportation Needs: No Transportation Needs (04/16/2024)  Utilities: Not At Risk (04/16/2024)  Depression (PHQ2-9): Low Risk  (01/30/2023)  Financial Resource Strain: Low Risk  (01/30/2023)  Social Connections: Socially Isolated (04/16/2024)  Tobacco Use: High Risk (04/16/2024)   SDOH Interventions:     Readmission Risk Interventions     No data to display

## 2024-04-18 NOTE — Evaluation (Signed)
 Occupational Therapy Evaluation Patient Details Name: Colton Nguyen MRN: [id number] DOB: Jun 15, 1946 Today's Date: 04/18/2024   History of Present Illness   Pt is a 78 y/o M who presented to ED with c/o SOB and wheezing. PMH signficant for COPD on 2L via [place] at baseline, HFpEF, squamous cell lung cancer in 2016, HTN, HLD, BPH, CAD.     Clinical Impressions Pt was seen for OT evaluation this date. Prior to hospital admission, pt was receiving rehab at a STR facility. Pt lives Prior to rehab stay pt was indep in ADL/IADLs, amb with RW on a PRN bases. Pt was living with his children in a one level home with 5 steps to enter. Pt presents with deficits in functional mobility and safety awareness affecting safe and optimal ADL completion. Pt currently requires supervision for bed mobility, CGA for STS from standard bed height with good use learned techniques. Pt took lateral steps up the head of bed and then step pivoted into recliner with MINA for DME management and verbal cues for pursed lip breathing technique, as pt began to get SOB. Pt returned to recliner Spo2 levels <90%, on 2.5L/min. Pt quickly recovered and was left with all needs in reach.  Pt would benefit from skilled OT services to address noted impairments and functional limitations (see below for any additional details) in order to maximize safety and independence while minimizing falls risk and caregiver burden. OT will follow acutely.     If plan is discharge home, recommend the following:   A little help with walking and/or transfers;A lot of help with bathing/dressing/bathroom;Assistance with cooking/housework;Assist for transportation;Help with stairs or ramp for entrance     Functional Status Assessment   Patient has had a recent decline in their functional status and demonstrates the ability to make significant improvements in function in a reasonable and predictable amount of time.     Equipment Recommendations    Other (comment) (Defer to next venue of care)     Recommendations for Other Services         Precautions/Restrictions   Precautions Precautions: Fall Recall of Precautions/Restrictions: Intact Restrictions Weight Bearing Restrictions Per Provider Order: No     Mobility Bed Mobility Overal bed mobility: Needs Assistance Bed Mobility: Supine to Sit     Supine to sit: HOB elevated, Supervision     General bed mobility comments: Increased time to completed with no assistance    Transfers Overall transfer level: Needs assistance Equipment used: Rolling walker (2 wheels) Transfers: Sit to/from Stand, Bed to chair/wheelchair/BSC Sit to Stand: Contact guard assist     Step pivot transfers: Min assist     General transfer comment: MINA for transfer technique and sudden SOB when mobility began      Balance Overall balance assessment: Needs assistance Sitting-balance support: No upper extremity supported, Feet supported Sitting balance-Leahy Scale: Good Sitting balance - Comments: Steady reaching within BOS   Standing balance support: During functional activity, Bilateral upper extremity supported Standing balance-Leahy Scale: Fair Standing balance comment: reliant on UE support in standing                           ADL either performed or assessed with clinical judgement   ADL Overall ADL's : Needs assistance/impaired Eating/Feeding: Set up;Sitting   Grooming: Wash/dry face;Sitting               Lower Body Dressing: Moderate assistance   Toilet Transfer: Ambulation;Minimal assistance;Rolling walker (  2 wheels);BSC/3in1 Toilet Transfer Details (indicate cue type and reason): Step pivot tranfser, simulated toilet tranfer         Functional mobility during ADLs: Contact guard assist;Minimal assistance;Rolling walker (2 wheels);Cueing for sequencing General ADL Comments: Simulated toilet transfer from the EOB<> recliner with use of RW +  CGA-MINA                                Pertinent Vitals/Pain Pain Assessment Pain Assessment: No/denies pain     Extremity/Trunk Assessment Upper Extremity Assessment Upper Extremity Assessment: Generalized weakness   Lower Extremity Assessment Lower Extremity Assessment: Generalized weakness   Cervical / Trunk Assessment Cervical / Trunk Assessment: Normal   Communication Communication Communication: No apparent difficulties   Cognition Arousal: Alert Behavior During Therapy: WFL for tasks assessed/performed, Impulsive Cognition: No apparent impairments             OT - Cognition Comments: A/Ox3, with cues came to correct date                 Following commands: Intact       Cueing  General Comments   Cueing Techniques: Verbal cues      Exercises Exercises: Other exercises Other Exercises Other Exercises: Edu: Role of OT eval, safe ADL completion, benefits of rehab         Home Living Family/patient expects to be discharged to:: Private residence Living Arrangements: Children Available Help at Discharge: Family;Available PRN/intermittently (unsure how much assistance his son can provide) Type of Home: House Home Access: Stairs to enter [employer] of Steps: 5 Entrance Stairs-Rails: Right;Left;Can reach both Home Layout: One level     Bathroom Shower/Tub: Producer, television/film/video: Handicapped height Bathroom Accessibility: Yes How Accessible: Accessible via walker Home Equipment: Rolling Walker (2 wheels);Cane - single point;Shower seat   Additional Comments: Pt on 2Ls via [place] at home      Prior Functioning/Environment Prior Level of Function : Independent/Modified Independent             Mobility Comments: Pt reports using RW for amb outside of home, intermittently for home amb. Pt denies falls ADLs Comments: Pt reports being IND with IADLs    OT Problem List: Decreased strength;Impaired balance  (sitting and/or standing);Pain;Decreased activity tolerance;Decreased knowledge of use of DME or AE;Cardiopulmonary status limiting activity;Decreased safety awareness   OT Treatment/Interventions: Self-care/ADL training;Therapeutic exercise;Energy conservation;Patient/family education;Balance training;Therapeutic activities;DME and/or AE instruction      OT Goals(Current goals can be found in the care plan section)   Acute Rehab OT Goals Patient Stated Goal: Feel better OT Goal Formulation: With patient Time For Goal Achievement: 05/02/24 Potential to Achieve Goals: Fair ADL Goals Pt Will Perform Grooming: with supervision;standing Pt Will Perform Lower Body Dressing: sit to/from stand;with supervision Pt Will Transfer to Toilet: with supervision;ambulating Pt Will Perform Toileting - Clothing Manipulation and hygiene: with supervision;sitting/lateral leans   OT Frequency:  Min 2X/week                  AM-PAC OT 6 Clicks Daily Activity     Outcome Measure Help from another person eating meals?: None Help from another person taking care of personal grooming?: None Help from another person toileting, which includes using toliet, bedpan, or urinal?: A Lot Help from another person bathing (including washing, rinsing, drying)?: A Lot Help from another person to put on and taking off regular upper body clothing?:  A Lot Help from another person to put on and taking off regular lower body clothing?: A Lot 6 Click Score: 16   End of Session Equipment Utilized During Treatment: Rolling walker (2 wheels);Oxygen  Nurse Communication: Mobility status  Activity Tolerance: Patient tolerated treatment well Patient left: in chair;with chair alarm set;with call bell/phone within reach  OT Visit Diagnosis: Unsteadiness on feet (R26.81);Muscle weakness (generalized) (M62.81)                Time: 9043-8979 OT Time Calculation (min): 24 min Charges:  OT General Charges $OT Visit: 1  Visit OT Evaluation $OT Eval Moderate Complexity: 1 Mod OT Treatments $Self Care/Home Management : 8-22 mins  Larraine Colas M.S. OTR/L  04/18/24, 12:59 PM

## 2024-04-19 DIAGNOSIS — J441 Chronic obstructive pulmonary disease with (acute) exacerbation: Secondary | ICD-10-CM | POA: Diagnosis not present

## 2024-04-19 LAB — GLUCOSE, CAPILLARY: Glucose-Capillary: 81 mg/dL (ref 70–99)

## 2024-04-19 MED ORDER — AMOXICILLIN-POT CLAVULANATE 875-125 MG PO TABS
1.0000 | ORAL_TABLET | Freq: Two times a day (BID) | ORAL | Status: AC
  Administered 2024-04-19 – 2024-04-20 (×6): 1 via ORAL
  Filled 2024-04-19 (×4): qty 1

## 2024-04-19 NOTE — Care Management Important Message (Signed)
 Important Message  Patient Details  Name: Colton Nguyen MRN: [id number] Date of Birth: 1946-01-08   Important Message Given:  Yes - Medicare IM     Colton Nguyen 04/19/2024, 3:33 PM

## 2024-04-19 NOTE — Progress Notes (Signed)
 PROGRESS NOTE  Christ Fullenwider Fiddler    DOB: 10/17/1945, 78 y.o.  FMW:[id number]    Code Status: Full Code   DOA: 04/16/2024   LOS: 3   Brief hospital course  Donn Zanetti is a 78 y.o. male with medical history significant of COPD with chronic hypoxic respite failure on 2 L via nasal cannula as needed for shortness of breath, cigarette smoking, remote history of squamous cell lung cancer in 2016, HTN, chronic HFpEF, HLD, BPH, CAD. He presents to ED 04/16/24 with worsening of shortness of breath and wheezing x4-5 days w/ exertional dyspnea    08/10: to ED< hypertensive 200/86 improved to 160/80, requiring BiPAP briefly then off this and onto RA/O2 2L. CT chest without contrast showed main bronchus fluid and posterior segment of right lower lobe infiltrates indicating for ongoing aspiration- needing up to 4L.   04/19/24 -received treatment for RLL PNA. Currently stable on home o2. Transitioned to PO abx to complete course. Medically stable and awaiting dc to SNF.   Assessment & Plan  Principal Problem:   COPD exacerbation (HCC) Active Problems:   Chronic respiratory failure with hypoxia, on home O2 therapy (HCC)   Aspiration pneumonia (HCC)   PNA (pneumonia)  RLL PNA Acute on chronic hypoxic respiratory failure- improving, off BiPAP. On home 2L currently Acute COPD exacerbation - Unasyn  transitioned to augmentin  - 5 day prednisone  course Incentive spirometry, flutter valve, encouraged patient to use.  - continue breathing treatments PRN and scheduled   Acute on chronic HFpEF decompensation HTN emergency- improved, not yet at goal CAD w/ ACS ruled out  - continue p.o. Lasix , losartan , may need to titrate up Outpatient cardiology follow-up for stress test. strict I&O   BPH Continue Flomax  and Proscar    Cigarette smoking Cessation education completed Continue nicotine  patch therapy  Body mass index is 21.12 kg/m.  VTE ppx: enoxaparin  (LOVENOX ) injection 40 mg Start:  04/16/24 2200  Diet:     Diet   Diet Heart Room service appropriate? Yes with Assist; Fluid consistency: Thin   Consultants: None   Subjective 04/19/24    Pt reports feeling well. Has not used his IS yet. Demonstrated knowledge on how to use it. Denies SOB, CP.    Objective  Blood pressure (!) 167/84, pulse 81, temperature 97.6 F (36.4 C), resp. rate 16, height 5' 8 (1.727 m), weight 63 kg, SpO2 98%.  Intake/Output Summary (Last 24 hours) at 04/19/2024 0806 Last data filed at 04/18/2024 1900 Gross per 24 hour  Intake 540 ml  Output --  Net 540 ml   Filed Weights   04/16/24 0325  Weight: 63 kg     Physical Exam:  General: awake, alert, NAD HEENT: atraumatic, clear conjunctiva, anicteric sclera, MMM, hearing grossly normal Respiratory: normal respiratory effort. Cardiovascular: quick capillary refill, normal S1/S2, RRR, no JVD, murmurs Nervous: A&O x3. no gross focal neurologic deficits, normal speech Extremities: moves all equally, no edema, normal tone Skin: dry, intact, normal temperature, normal color. No rashes, lesions or ulcers on exposed skin Psychiatry: normal mood, congruent affect  Labs   I have personally reviewed the following labs and imaging studies CBC    Component Value Date/Time   WBC 7.8 04/18/2024 0442   RBC 3.92 (L) 04/18/2024 0442   HGB 9.5 (L) 04/18/2024 0442   HGB 12.5 (L) 07/21/2023 0915   HGB 13.6 01/28/2023 1222   HGB 13.9 06/25/2017 0819   HCT 30.4 (L) 04/18/2024 0442   HCT 41.5 01/28/2023 1222  HCT 42.6 06/25/2017 0819   PLT 249 04/18/2024 0442   PLT 281 07/21/2023 0915   PLT 211 06/25/2017 0819   MCV 77.6 (L) 04/18/2024 0442   MCV 81 01/28/2023 1222   MCV 83.6 06/25/2017 0819   MCH 24.2 (L) 04/18/2024 0442   MCHC 31.3 04/18/2024 0442   RDW 18.2 (H) 04/18/2024 0442   RDW 14.4 01/28/2023 1222   RDW 15.3 (H) 06/25/2017 0819   LYMPHSABS 0.9 04/16/2024 0433   LYMPHSABS 1.2 01/28/2023 1222   LYMPHSABS 1.2 06/25/2017 0819    MONOABS 0.3 04/16/2024 0433   MONOABS 0.4 06/25/2017 0819   EOSABS 0.0 04/16/2024 0433   EOSABS 0.1 01/28/2023 1222   BASOSABS 0.0 04/16/2024 0433   BASOSABS 0.0 01/28/2023 1222   BASOSABS 0.0 06/25/2017 0819      Latest Ref Rng & Units 04/18/2024    4:42 AM 04/16/2024    3:33 AM 04/04/2024    4:27 AM  BMP  Glucose 70 - 99 mg/dL 82  882  95   BUN 8 - 23 mg/dL 15  11  39   Creatinine 0.61 - 1.24 mg/dL 9.17  9.05  9.16   Sodium 135 - 145 mmol/L 138  134  140   Potassium 3.5 - 5.1 mmol/L 3.6  3.7  3.7   Chloride 98 - 111 mmol/L 97  94  100   CO2 22 - 32 mmol/L 35  33  33   Calcium  8.9 - 10.3 mg/dL 8.6  8.5  8.5     No results found.  Disposition Plan & Communication  Patient status: Inpatient  Admitted From: Home Planned disposition location: Skilled nursing facility Anticipated discharge date: TBD pending placement  Family Communication: none at bedside    Author: Marien LITTIE Piety, DO [facility] 04/19/2024, 8:06 AM   Available by Epic secure chat 7AM-7PM. If 7PM-7AM, please contact night-coverage.  [facility] contact information found on [email or web address].

## 2024-04-19 NOTE — Progress Notes (Signed)
 Occupational Therapy Treatment Patient Details Name: Colton Nguyen MRN: [id number] DOB: 1946/08/19 Today's Date: 04/19/2024   History of present illness Pt is a 78 y/o M who presented to ED with c/o SOB and wheezing. PMH signficant for COPD on 2L via [place] at baseline, HFpEF, squamous cell lung cancer in 2016, HTN, HLD, BPH, CAD.   OT comments  Pt seen for OT treatment on this date. Upon arrival to room pt supine in bed with HOB elevated, agreeable to tx. Pt requires no physical assistance for bed mobility with increased time, completed UB/LB dressing tasks in sitting and standing with Min A, and functional transfers with CGA to Min A with increase balance and independence however continued to demonstrate unsteadiness on feet during standing tasks with ongoing risk of LOB with posterior leaning.  Pt making good progress toward goals, will continue to follow POC. Discharge recommendation remains appropriate.        If plan is discharge home, recommend the following:      Equipment Recommendations  Other (comment) (defer to next venue of care)    Recommendations for Other Services      Precautions / Restrictions Precautions Precautions: Fall Recall of Precautions/Restrictions: Intact Restrictions Weight Bearing Restrictions Per Provider Order: No       Mobility Bed Mobility Overal bed mobility: Needs Assistance Bed Mobility: Supine to Sit     Supine to sit: HOB elevated, Supervision     General bed mobility comments: Increased time to completed with no assistance    Transfers Overall transfer level: Needs assistance Equipment used: Rolling walker (2 wheels) Transfers: Sit to/from Stand Sit to Stand: Contact guard assist Stand pivot transfers: Contact guard assist   Step pivot transfers: Contact guard assist     General transfer comment: 2 STS from recliner with RW and CGA     Balance Overall balance assessment: Needs assistance Sitting-balance support: Feet  supported Sitting balance-Leahy Scale: Good Sitting balance - Comments: Steady reaching within BOS   Standing balance support: Single extremity supported, Bilateral upper extremity supported, During functional activity, No upper extremity supported, Reliant on assistive device for balance (Completed standing components of LB washing and perineal hygiene with bilateral hand use at times.) Standing balance-Leahy Scale: Fair Standing balance comment: Completed standing components of LB washing and perineal hygiene with bilateral hand use at times.                           ADL either performed or assessed with clinical judgement   ADL Overall ADL's : Needs assistance/impaired             Lower Body Bathing: Contact guard assist;Sit to/from stand Lower Body Bathing Details (indicate cue type and reason): perineal wash up in standing Upper Body Dressing : Minimal assistance;Contact guard assist Upper Body Dressing Details (indicate cue type and reason): doff/don hospital gown Lower Body Dressing: Minimal assistance;Sit to/from stand Lower Body Dressing Details (indicate cue type and reason): Min A to doff/don brief/undergarment Toilet Transfer: Contact guard assist;Rolling walker (2 wheels) Toilet Transfer Details (indicate cue type and reason): Step pivot tranfser, simulated toilet tranfer Toileting- Clothing Manipulation and Hygiene: Contact guard assist;Minimal assistance;Sit to/from stand       Functional mobility during ADLs: Contact guard assist;Minimal assistance;Rolling walker (2 wheels);Cueing for sequencing      Extremity/Trunk Assessment Upper Extremity Assessment Upper Extremity Assessment: Generalized weakness       Cervical / Trunk Assessment Cervical / Trunk Assessment:  Normal    Vision       Perception     Praxis     Communication Communication Communication: No apparent difficulties   Cognition Arousal: Alert Behavior During Therapy: WFL  for tasks assessed/performed Cognition: No apparent impairments                               Following commands: Intact        Cueing   Cueing Techniques: Verbal cues  Exercises Other Exercises Other Exercises: Completed standing components of LB washing and perineal hygiene with bilateral hand use at times, cuing for maintaining functional balance during standing components of task engagement, SpO2 Monitored throughout session (98-100%).    Shoulder Instructions       General Comments      Pertinent Vitals/ Pain       Pain Assessment Pain Assessment: No/denies pain  Home Living                                          Prior Functioning/Environment              Frequency  Min 2X/week        Progress Toward Goals  OT Goals(current goals can now be found in the care plan section)  Progress towards OT goals: Progressing toward goals     Plan      Co-evaluation                 AM-PAC OT 6 Clicks Daily Activity     Outcome Measure   Help from another person eating meals?: None Help from another person taking care of personal grooming?: None Help from another person toileting, which includes using toliet, bedpan, or urinal?: A Little Help from another person bathing (including washing, rinsing, drying)?: A Little Help from another person to put on and taking off regular upper body clothing?: A Little Help from another person to put on and taking off regular lower body clothing?: A Little 6 Click Score: 20    End of Session Equipment Utilized During Treatment: Gait belt;Rolling walker (2 wheels);Oxygen   OT Visit Diagnosis: Unsteadiness on feet (R26.81);Muscle weakness (generalized) (M62.81)   Activity Tolerance Patient tolerated treatment well   Patient Left in chair;with chair alarm set;with call bell/phone within reach   Nurse Communication Mobility status        Time: 8782-8753 OT Time Calculation (min):  29 min  Charges: OT General Charges $OT Visit: 1 Visit OT Treatments $Self Care/Home Management : 23-37 mins  Harlene Sharps OTR/L   Harlene LITTIE Sharps 04/19/2024, 1:01 PM

## 2024-04-19 NOTE — Plan of Care (Signed)

## 2024-04-20 DIAGNOSIS — J441 Chronic obstructive pulmonary disease with (acute) exacerbation: Secondary | ICD-10-CM | POA: Diagnosis not present

## 2024-04-20 NOTE — Progress Notes (Signed)
 PROGRESS NOTE  Colton Nguyen    DOB: 1946-03-16, 78 y.o.  FMW:[id number]    Code Status: Full Code   DOA: 04/16/2024   LOS: 4   Brief hospital course  Colton Nguyen is a 78 y.o. male with medical history significant of COPD with chronic hypoxic respite failure on 2 L via nasal cannula as needed for shortness of breath, cigarette smoking, remote history of squamous cell lung cancer in 2016, HTN, chronic HFpEF, HLD, BPH, CAD. He presents to ED 04/16/24 with worsening of shortness of breath and wheezing x4-5 days w/ exertional dyspnea    08/10: to ED< hypertensive 200/86 improved to 160/80, requiring BiPAP briefly then off this and onto RA/O2 2L. CT chest without contrast showed main bronchus fluid and posterior segment of right lower lobe infiltrates indicating for ongoing aspiration- needing up to 4L.   04/20/24 -received treatment for RLL PNA. Currently stable on home o2. Transitioned to PO abx to complete course. Medically stable and awaiting dc to SNF.   Assessment & Plan  Principal Problem:   COPD exacerbation (HCC) Active Problems:   Chronic respiratory failure with hypoxia, on home O2 therapy (HCC)   Aspiration pneumonia (HCC)   PNA (pneumonia)  RLL PNA Acute on chronic hypoxic respiratory failure- improving, off BiPAP. On home 2L currently Acute COPD exacerbation - Unasyn  transitioned to augmentin  - 5 day prednisone  course Incentive spirometry, flutter valve, encouraged patient to use.  - continue breathing treatments PRN and scheduled   Acute on chronic HFpEF decompensation HTN emergency- improved, not yet at goal CAD w/ ACS ruled out  - continue p.o. Lasix , losartan , may need to titrate up Outpatient cardiology follow-up for stress test. strict I&O   BPH Continue Flomax  and Proscar    Cigarette smoking Cessation education completed Continue nicotine  patch therapy  Body mass index is 21.12 kg/m.  VTE ppx: enoxaparin  (LOVENOX ) injection 40 mg Start:  04/16/24 2200  Diet:     Diet   Diet Heart Room service appropriate? Yes with Assist; Fluid consistency: Thin   Consultants: None   Subjective 04/20/24    Pt reports feeling well. Denies SOB, CP.    Objective  Blood pressure (!) 167/84, pulse 81, temperature 97.6 F (36.4 C), resp. rate 16, height 5' 8 (1.727 m), weight 63 kg, SpO2 98%.  Intake/Output Summary (Last 24 hours) at 04/20/2024 0741 Last data filed at 04/20/2024 0314 Gross per 24 hour  Intake 1140 ml  Output 800 ml  Net 340 ml   Filed Weights   04/16/24 0325  Weight: 63 kg    Physical Exam:  General: awake, alert, NAD HEENT: atraumatic, clear conjunctiva, anicteric sclera, MMM, hearing grossly normal Respiratory: normal respiratory effort. Cardiovascular: quick capillary refill, normal S1/S2, RRR, no JVD, murmurs Nervous: A&O x3. no gross focal neurologic deficits, normal speech Extremities: moves all equally, no edema, normal tone Skin: dry, intact, normal temperature, normal color. No rashes, lesions or ulcers on exposed skin Psychiatry: normal mood, congruent affect  Labs   I have personally reviewed the following labs and imaging studies CBC    Component Value Date/Time   WBC 7.8 04/18/2024 0442   RBC 3.92 (L) 04/18/2024 0442   HGB 9.5 (L) 04/18/2024 0442   HGB 12.5 (L) 07/21/2023 0915   HGB 13.6 01/28/2023 1222   HGB 13.9 06/25/2017 0819   HCT 30.4 (L) 04/18/2024 0442   HCT 41.5 01/28/2023 1222   HCT 42.6 06/25/2017 0819   PLT 249 04/18/2024 0442   PLT  281 07/21/2023 0915   PLT 211 06/25/2017 0819   MCV 77.6 (L) 04/18/2024 0442   MCV 81 01/28/2023 1222   MCV 83.6 06/25/2017 0819   MCH 24.2 (L) 04/18/2024 0442   MCHC 31.3 04/18/2024 0442   RDW 18.2 (H) 04/18/2024 0442   RDW 14.4 01/28/2023 1222   RDW 15.3 (H) 06/25/2017 0819   LYMPHSABS 0.9 04/16/2024 0433   LYMPHSABS 1.2 01/28/2023 1222   LYMPHSABS 1.2 06/25/2017 0819   MONOABS 0.3 04/16/2024 0433   MONOABS 0.4 06/25/2017 0819    EOSABS 0.0 04/16/2024 0433   EOSABS 0.1 01/28/2023 1222   BASOSABS 0.0 04/16/2024 0433   BASOSABS 0.0 01/28/2023 1222   BASOSABS 0.0 06/25/2017 0819      Latest Ref Rng & Units 04/18/2024    4:42 AM 04/16/2024    3:33 AM 04/04/2024    4:27 AM  BMP  Glucose 70 - 99 mg/dL 82  882  95   BUN 8 - 23 mg/dL 15  11  39   Creatinine 0.61 - 1.24 mg/dL 9.17  9.05  9.16   Sodium 135 - 145 mmol/L 138  134  140   Potassium 3.5 - 5.1 mmol/L 3.6  3.7  3.7   Chloride 98 - 111 mmol/L 97  94  100   CO2 22 - 32 mmol/L 35  33  33   Calcium  8.9 - 10.3 mg/dL 8.6  8.5  8.5    No results found.  Disposition Plan & Communication  Patient status: Inpatient  Admitted From: Home Planned disposition location: Skilled nursing facility Anticipated discharge date: TBD pending placement  Family Communication: none at bedside    Author: Marien LITTIE Piety, DO [facility] 04/20/2024, 7:41 AM   Available by Epic secure chat 7AM-7PM. If 7PM-7AM, please contact night-coverage.  [facility] contact information found on [email or web address].

## 2024-04-20 NOTE — TOC Initial Note (Addendum)
 Transition of Care [facility]) - Initial/Assessment Note    Patient Details  Name: Colton Nguyen MRN: [id number] Date of Birth: May 15, 1946  Transition of Care [facility] CM/SW Contact:    Colton Nguyen Fuse, RN Phone Number: 04/20/2024, 1:50 PM  Clinical Narrative:                 Patient is from home with his son Colton Nguyen. He doesn't drive, but his son is able to take him where he needs to go. He has a PCP at [facility] and uses [facility]. He has DME walker and cane. The patient was at [facility] for STR but recently left AMA. The patient was seen smoking with O2 he was given the option to transition to a smoking facility or to leave. They will not accept the patient back. The patient lives in [place] and would like a facility near by. The FL2 was sent out in [place], [place]. He has bed offers from [facility], [facility], and [facility].  The patient chooses [facility]. His payor is traditional Medicare, ins shara is not needed. TOC outreached to Colton Nguyen at [facility] to accept the bed.   Expected Discharge Plan: Skilled Nursing Facility Barriers to Discharge: Continued Medical Work up   Patient Goals and CMS Choice     Choice offered to / list presented to : Patient      Expected Discharge Plan and Services   Discharge Planning Services: CM Consult   Living arrangements for the past 2 months: Single Family Home                                      Prior Living Arrangements/Services Living arrangements for the past 2 months: Single Family Home Lives with:: Self                   Activities of Daily Living   ADL Screening (condition at time of admission) Independently performs ADLs?: Yes (appropriate for developmental age) Is the patient deaf or have difficulty hearing?: No Does the patient have difficulty seeing, even when wearing glasses/contacts?: No Does the patient have difficulty concentrating, remembering, or making decisions?: No  Permission Sought/Granted                   Emotional Assessment              Admission diagnosis:  COPD exacerbation (HCC) [J44.1] PNA (pneumonia) [J18.9] Acute respiratory failure with hypoxia and hypercapnia (HCC) [J96.01, J96.02] Patient Active Problem List   Diagnosis Date Noted   Aspiration pneumonia (HCC) 04/16/2024   PNA (pneumonia) 04/16/2024   Restless leg syndrome 04/14/2024   Depression 04/14/2024   Protein-calorie malnutrition, severe 04/01/2024   COPD exacerbation (HCC) 03/30/2024   Prediabetes 01/30/2023   Chronic respiratory failure with hypoxia, on home O2 therapy (HCC) 01/28/2023   Iron deficiency anemia, unspecified 12/21/2019   Drug induced constipation 12/21/2019   Anxiety 12/21/2019   Tobacco use disorder 12/21/2019   Cognitive communication deficit 12/21/2019   Lumbar stenosis with neurogenic claudication 11/14/2019   BPH (benign prostatic hyperplasia) 08/01/2018   Hypertension 12/28/2016   COPD (chronic obstructive pulmonary disease) (HCC) 12/28/2016   Encounter for nail care 06/03/2015   Smoking trying to quit 04/01/2015   Squamous cell carcinoma of lung, stage III 03/07/2015   spiculated right upper lobe mass 02/14/2015   PCP:  Colton Alan HERO, FNP Pharmacy:   [facility] -  Colton Nguyen, [place] - [address] [place] [address] Phone: [phone] Fax: [phone]     Social Drivers of Health (SDOH) Social History: SDOH Screenings   Food Insecurity: No Food Insecurity (04/16/2024)  Housing: Low Risk  (04/16/2024)  Transportation Needs: No Transportation Needs (04/16/2024)  Utilities: Not At Risk (04/16/2024)  Depression (PHQ2-9): Low Risk  (01/30/2023)  Financial Resource Strain: Low Risk  (01/30/2023)  Social Connections: Socially Isolated (04/16/2024)  Tobacco Use: High Risk (04/16/2024)   SDOH Interventions:     Readmission Risk Interventions     No data to display

## 2024-04-20 NOTE — Plan of Care (Signed)

## 2024-04-21 DIAGNOSIS — J441 Chronic obstructive pulmonary disease with (acute) exacerbation: Secondary | ICD-10-CM | POA: Diagnosis not present

## 2024-04-21 LAB — CULTURE, BLOOD (ROUTINE X 2)
Culture: NO GROWTH
Culture: NO GROWTH
Special Requests: ADEQUATE
Special Requests: ADEQUATE

## 2024-04-21 MED ORDER — AMOXICILLIN-POT CLAVULANATE 875-125 MG PO TABS
1.0000 | ORAL_TABLET | Freq: Two times a day (BID) | ORAL | Status: DC
Start: 2024-04-21 — End: 2024-05-22

## 2024-04-21 MED ORDER — ALBUTEROL SULFATE HFA 108 (90 BASE) MCG/ACT IN AERS: 1.0000 | INHALATION_SPRAY | RESPIRATORY_TRACT | 0 refills | Status: AC | PRN

## 2024-04-21 MED ORDER — AMOXICILLIN-POT CLAVULANATE 875-125 MG PO TABS: 1.0000 | ORAL_TABLET | Freq: Two times a day (BID) | ORAL | 0 refills | Status: DC

## 2024-04-21 MED ORDER — PANTOPRAZOLE SODIUM 40 MG PO TBEC: 40.0000 mg | DELAYED_RELEASE_TABLET | Freq: Two times a day (BID) | ORAL | 0 refills | Status: DC

## 2024-04-21 MED ORDER — GUAIFENESIN ER 600 MG PO TB12: 1200.0000 mg | ORAL_TABLET | Freq: Two times a day (BID) | ORAL | 0 refills | Status: DC

## 2024-04-21 MED ORDER — PANTOPRAZOLE SODIUM 40 MG PO TBEC: 40.0000 mg | DELAYED_RELEASE_TABLET | Freq: Two times a day (BID) | ORAL | Status: DC

## 2024-04-21 MED ORDER — GUAIFENESIN ER 600 MG PO TB12: 1200.0000 mg | ORAL_TABLET | Freq: Two times a day (BID) | ORAL | Status: AC

## 2024-04-21 NOTE — Discharge Summary (Signed)
 Physician Discharge Summary  Patient: Colton Nguyen FMW:[id number] DOB: [birth year]   Code Status: Full Code Admit date: 04/16/2024 Discharge date: 04/21/2024 Disposition: Skilled nursing facility, PT, OT, nurse aid, and RN PCP: [name], FNP  Recommendations for Outpatient Follow-up:  Follow up with PCP within 1-2 weeks Regarding general hospital follow up and preventative care Recommend CBC, BMP BP elevated while inpatient (140-150s systolics). Consider titrating up blood pressure control on follow up  Discharge Diagnoses:  Principal Problem:   COPD exacerbation (HCC) Active Problems:   Chronic respiratory failure with hypoxia, on home O2 therapy (HCC)   Aspiration pneumonia (HCC)   PNA (pneumonia)  Brief Hospital Course Summary: Colton Nguyen is a 78 y.o. male with medical history significant of COPD with chronic hypoxic respite failure on 2 L via nasal cannula as needed for shortness of breath, cigarette smoking, remote history of squamous cell lung cancer in 2016, HTN, chronic HFpEF, HLD, BPH, CAD. He presents to ED 04/16/24 with worsening of shortness of breath and wheezing x4-5 days w/ exertional dyspnea   ED course: hypertensive 200/86 improved to 160/80, requiring BiPAP briefly then off this and onto O2 2L. CT chest without contrast showed main bronchus fluid and posterior segment of right lower lobe infiltrates indicating for ongoing aspiration- needing up to 4L.  Completed antibiotic treatment for RLL PNA likely related to aspiration as well as COPD exacerbation with inhalers and steroids. Will have a couple more days of oral antibiotics after dc to complete course. Currently stable on home O2. He was also prescribed symptomatic relief for mucus/cough.   All other chronic conditions were treated with home medications.    Discharge Condition: Good, improved Recommended discharge diet: Regular healthy diet  Consultations: None    Procedures/Studies: None  Allergies as of 04/21/2024   No Known Allergies      Medication List     STOP taking these medications    LORazepam  0.5 MG tablet Commonly known as: ATIVAN    traMADol  50 MG tablet Commonly known as: ULTRAM        TAKE these medications    acetaminophen  500 MG tablet Commonly known as: TYLENOL  Take 1,000 mg by mouth every 6 (six) hours as needed for moderate pain or headache.   albuterol  108 (90 Base) MCG/ACT inhaler Commonly known as: VENTOLIN  HFA Inhale 1-2 puffs into the lungs every 4 (four) hours as needed for wheezing or shortness of breath. What changed: See the new instructions.   amoxicillin -clavulanate 875-125 MG tablet Commonly known as: AUGMENTIN  Take 1 tablet by mouth 2 (two) times daily.   aspirin  81 MG tablet Take 81 mg by mouth daily.   atorvastatin  40 MG tablet Commonly known as: LIPITOR Take 1 tablet (40 mg total) by mouth daily.   azelastine  0.1 % nasal spray Commonly known as: ASTELIN  Place 1 spray into both nostrils 2 (two) times daily.   buPROPion  150 MG 12 hr tablet Commonly known as: Wellbutrin  SR Take 1 tablet (150 mg total) by mouth 2 (two) times daily.   cyclobenzaprine  5 MG tablet Commonly known as: FLEXERIL  TAKE ONE TABLET (5 MG TOTAL) BY MOUTH THREE TIMES DAILY AS NEEDED FOR MUSCLE SPASMS.   ferrous sulfate  325 (65 FE) MG tablet Take 1 tablet (325 mg total) by mouth daily with breakfast.   finasteride  5 MG tablet Commonly known as: PROSCAR  Take 1 tablet (5 mg total) by mouth daily.   fluticasone  50 MCG/ACT nasal spray Commonly known as: FLONASE  Place 2 sprays  into both nostrils daily as needed for allergies.   furosemide  20 MG tablet Commonly known as: Lasix  Take 1 tablet (20 mg total) by mouth daily.   guaiFENesin  600 MG 12 hr tablet Commonly known as: MUCINEX  Take 2 tablets (1,200 mg total) by mouth 2 (two) times daily.   lidocaine  5 % Commonly known as: Lidoderm  Place 1 patch onto  the skin daily for 10 days. Remove & Discard patch within 12 hours or as directed by MD   losartan  50 MG tablet Commonly known as: COZAAR  Take 1 tablet (50 mg total) by mouth daily.   nicotine  21 mg/24hr patch Commonly known as: NICODERM CQ  - dosed in mg/24 hours Place 1 patch (21 mg total) onto the skin daily.   pantoprazole  40 MG tablet Commonly known as: PROTONIX  Take 1 tablet (40 mg total) by mouth 2 (two) times daily before a meal.   rOPINIRole  0.25 MG tablet Commonly known as: REQUIP  Take 1 tablet (0.25 mg total) by mouth 3 (three) times daily.   tamsulosin  0.4 MG Caps capsule Commonly known as: FLOMAX  Take 1 capsule (0.4 mg total) by mouth daily.   Trelegy Ellipta  100-62.5-25 MCG/ACT Aepb Generic drug: Fluticasone -Umeclidin-Vilant Inhale 1 puff into the lungs daily.   Vitamin D3 125 MCG (5000 UT) Tabs Take 5,000 Units by mouth daily.        Follow-up Information     [name], FNP Follow up.   Specialty: Family Medicine Why: hospital follow up Contact information: [address] [place] [address] [phone]                 Subjective   Pt reports feeling well. He is tolerating breakfast without choking or concerns this am. Denies SOB at rest.   All questions and concerns were addressed at time of discharge.  Objective  Blood pressure (!) 158/87, pulse 63, temperature 97.9 F (36.6 C), resp. rate 18, height 5' 8 (1.727 m), weight 63 kg, SpO2 98%.   General: Pt is alert, awake, not in acute distress Cardiovascular: RRR, S1/S2 +, no rubs, no gallops Respiratory: CTA bilaterally, no wheezing, no rhonchi. On 2L [place] Abdominal: Soft, NT, ND, bowel sounds + Extremities: no edema, no cyanosis  The results of significant diagnostics from this hospitalization (including imaging, microbiology, ancillary and laboratory) are listed below for reference.   Imaging studies: CT Chest Wo Contrast Result Date: 04/16/2024 CLINICAL DATA:  Respiratory  illness. Nondiagnostic x-ray. COPD history. EXAM: CT CHEST WITHOUT CONTRAST TECHNIQUE: Multidetector CT imaging of the chest was performed following the standard protocol without IV contrast. RADIATION DOSE REDUCTION: This exam was performed according to the departmental dose-optimization program which includes automated exposure control, adjustment of the mA and/or kV according to patient size and/or use of iterative reconstruction technique. COMPARISON:  Portable chest today, portable chest and CTA chest both 03/30/2024, chest CT with contrast 07/21/2023, and chest CT with contrast 07/09/2022. FINDINGS: Cardiovascular: The cardiac size is normal. Coronary arteries are heavily calcified. No pericardial effusion is seen. There is prominence of the central pulmonary arteries and trunk with the main pulmonary artery 3.1 cm, unchanged. The pulmonary veins are normal caliber. There is a mildly tortuous aorta with moderate atherosclerosis, scattered calcification in the great vessels. No aortic aneurysm. Mediastinum/Nodes: Stable CT appearance of a multinodular goiter, displacing the trachea to the right. No intrathoracic adenopathy is seen without contrast. There is a patulous esophagus with normal wall thickness. Unremarkable trachea except for retained secretions in the distal lumen. Axillary  spaces are clear. Lungs/Pleura: There is fluid in the right main bronchus and scattered right lower lobe subsegmental bronchial impactions. Patchy airspace disease continues to be seen in the posterior basal right lower lobe with interval development of a small layering right pleural effusion. Findings are most likely due to ongoing aspiration. The airspace disease was first seen on the last CT, not present earlier. There is a stable appearance of post treatment fibrotic consolidation, volume loss and cystic changes in the right apex extending to the upper hilum. There is moderate to advanced emphysematous disease with  centrilobular changes predominating. There is a stable 5 mm subpleural left upper lobe nodule on 4:53, stable 7 mm pleural-based left lower lobe nodule on 4:127. No new nodule is seen. No left pleural fluid. Upper Abdomen: Adenomatous hyperplasia of both adrenal glands. No acute findings. Musculoskeletal: Osteopenia and degenerative change thoracic spine. No bone metastasis is seen. A lumbar fusion construct partially visible beginning at L3. IMPRESSION: 1. Fluid in the right main bronchus and scattered right lower lobe subsegmental bronchial impactions, with patchy airspace disease in the posterior basal right lower lobe. Findings are most likely due to ongoing aspiration. 2. Interval development of a small layering right pleural effusion. 3. Stable post treatment fibrotic changes in the right apex. 4. Stable 5 mm left upper lobe and 7 mm left lower lobe nodules. 5. Aortic and coronary artery atherosclerosis. 6. Prominent central pulmonary arteries. 7. 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 8. Emphysema. Aortic Atherosclerosis (ICD10-I70.0) and Emphysema (ICD10-J43.9). Electronically Signed   By: Francis Quam M.D.   On: 04/16/2024 05:45   DG Chest Port 1 View Result Date: 04/16/2024 CLINICAL DATA:  Acute dyspnea. Shortness of breath. Pneumonia diagnosed 3-4 days ago. EXAM: PORTABLE CHEST 1 VIEW COMPARISON:  CTA chest 03/30/2024. FINDINGS: Coarsely nodular and hazy airspace disease in the right lung base appears slightly worsened from 03/30/2024. Right apical fibrotic changes and right-sided volume loss are chronically seen with emphysematous changes. No other focal infiltrate. Heart size and vascular markings are normal. The stable mediastinum with aortic atherosclerosis and uncoiling. No new osseous finding. Degenerative change thoracic spine. Multiple overlying telemetry leads. IMPRESSION: 1. Coarsely nodular and hazy airspace disease in the right lung base appears slightly worsened from 03/30/2024. 2.  Right apical fibrotic changes and right-sided volume loss are chronically seen with emphysematous changes. Electronically Signed   By: Francis Quam M.D.   On: 04/16/2024 04:04   ECHOCARDIOGRAM COMPLETE Result Date: 03/31/2024    ECHOCARDIOGRAM REPORT   Patient Name:   BRANNAN CASSEDY [facility] Date of Exam: 03/31/2024 Medical Rec #:  [id number]           Height:       68.0 in Accession #:    [id number]          Weight:       136.7 lb Date of Birth:  15-Mar-1946           BSA:          1.738 m Patient Age:    78 years            BP:           158/73 mmHg Patient Gender: M                   HR:           71 bpm. Exam Location:  [facility] Procedure: 2D Echo, Cardiac Doppler and Color Doppler (Both Spectral and Color  Flow Doppler were utilized during procedure). Indications:     CHF-acute diastolic I50.31  History:         Patient has no prior history of Echocardiogram examinations.                  CHF, COPD; Risk Factors:Hypertension.  Sonographer:     Christopher Furnace Referring Phys:  [id number] CORT ONEIDA MANA Diagnosing Phys: Marsa Dooms MD  Sonographer Comments: No parasternal window and no apical window. Image acquisition challenging due to COPD and Image acquisition challenging due to patient body habitus. IMPRESSIONS  1. Left ventricular ejection fraction, by estimation, is 60 to 65%. The left ventricle has normal function. The left ventricle has no regional wall motion abnormalities. Left ventricular diastolic parameters are indeterminate.  2. Right ventricular systolic function is normal. The right ventricular size is normal.  3. The mitral valve is normal in structure. Trivial mitral valve regurgitation. No evidence of mitral stenosis.  4. The aortic valve is normal in structure. Aortic valve regurgitation is not visualized. No aortic stenosis is present.  5. The inferior vena cava is normal in size with greater than 50% respiratory variability, suggesting right atrial pressure of 3 mmHg. FINDINGS  Left  Ventricle: Left ventricular ejection fraction, by estimation, is 60 to 65%. The left ventricle has normal function. The left ventricle has no regional wall motion abnormalities. Strain was performed and the global longitudinal strain is indeterminate. The left ventricular internal cavity size was normal in size. There is no left ventricular hypertrophy. Left ventricular diastolic parameters are indeterminate. Right Ventricle: The right ventricular size is normal. No increase in right ventricular wall thickness. Right ventricular systolic function is normal. Left Atrium: Left atrial size was normal in size. Right Atrium: Right atrial size was normal in size. Pericardium: There is no evidence of pericardial effusion. Mitral Valve: The mitral valve is normal in structure. Trivial mitral valve regurgitation. No evidence of mitral valve stenosis. Tricuspid Valve: The tricuspid valve is normal in structure. Tricuspid valve regurgitation is trivial. No evidence of tricuspid stenosis. Aortic Valve: The aortic valve is normal in structure. Aortic valve regurgitation is not visualized. No aortic stenosis is present. Pulmonic Valve: The pulmonic valve was normal in structure. Pulmonic valve regurgitation is not visualized. No evidence of pulmonic stenosis. Aorta: The aortic root is normal in size and structure. Venous: The inferior vena cava is normal in size with greater than 50% respiratory variability, suggesting right atrial pressure of 3 mmHg. IAS/Shunts: No atrial level shunt detected by color flow Doppler. Additional Comments: 3D was performed not requiring image post processing on an independent workstation and was indeterminate.  LEFT VENTRICLE PLAX 2D LVIDd:         3.70 cm LVIDs:         2.20 cm LV PW:         1.40 cm LV IVS:        0.80 cm  LEFT ATRIUM           Index LA Vol (A4C): 45.1 ml 25.94 ml/m Marsa Dooms MD Electronically signed by Marsa Dooms MD Signature Date/Time: 03/31/2024/3:16:38 PM     Final    CT Angio Chest PE W/Cm &/Or Wo Cm Result Date: 03/30/2024 CLINICAL DATA:  Multiple falls with decreased p.o. intake. Increased weakness. Non-small-cell lung cancer. * Tracking Code: BO * EXAM: CT ANGIOGRAPHY CHEST WITH CONTRAST TECHNIQUE: Multidetector CT imaging of the chest was performed using the standard protocol during bolus administration of intravenous contrast.  Multiplanar CT image reconstructions and MIPs were obtained to evaluate the vascular anatomy. RADIATION DOSE REDUCTION: This exam was performed according to the departmental dose-optimization program which includes automated exposure control, adjustment of the mA and/or kV according to patient size and/or use of iterative reconstruction technique. CONTRAST:  OMNIPAQUE  IOHEXOL  350 MG/ML SOLN COMPARISON:  Chest CT 07/21/2023 FINDINGS: Cardiovascular: The heart size is normal. No substantial pericardial effusion. Moderate atherosclerotic calcification is noted in the wall of the thoracic aorta. Enlargement of the pulmonary outflow tract/main pulmonary arteries suggests pulmonary arterial hypertension. There is no filling defect within the opacified pulmonary arteries to suggest the presence of an acute pulmonary embolus. Mediastinum/Nodes: No mediastinal lymphadenopathy. Multinodular thyroid enlargement is similar to prior. There is no hilar lymphadenopathy. Similar soft tissue fullness in the right hilum is likely treatment related. The esophagus has normal imaging features. There is no axillary lymphadenopathy. Lungs/Pleura: Centrilobular and paraseptal emphysema evident. Architectural distortion and scarring in the suprahilar right lung is similar to prior and compatible with treatment related fibrosis. New patchy airspace disease identified posterior right lower lobe, suspicious for pneumonia and aspiration would be a consideration. 6 mm left upper lobe pulmonary nodule is stable in the interval on image 36/5. Subpleural 5 mm  posterior right lung nodule on 101/5 is unchanged. Stable 7 mm subpleural left lower lobe nodule on 116/5. Upper Abdomen: Stable nodular thickening of both adrenal glands. Musculoskeletal: No worrisome lytic or sclerotic osseous abnormality. Review of the MIP images confirms the above findings. IMPRESSION: 1. No CT evidence for acute pulmonary embolus. 2. New patchy airspace disease posterior right lower lobe, suspicious for pneumonia and aspiration would be a consideration. 3. Stable architectural distortion and scarring in the suprahilar right lung compatible with treatment related fibrosis. 4. Stable bilateral pulmonary nodules. 5. Stable nodular thickening of both adrenal glands. 6. Aortic Atherosclerosis (ICD10-I70.0) and Emphysema (ICD10-J43.9). Electronically Signed   By: Camellia Candle M.D.   On: 03/30/2024 05:57   US  Venous Img Lower Bilateral Result Date: 03/30/2024 CLINICAL DATA:  Bilateral lower extremity swelling. EXAM: BILATERAL LOWER EXTREMITY VENOUS DOPPLER ULTRASOUND TECHNIQUE: Gray-scale sonography with graded compression, as well as color Doppler and duplex ultrasound were performed to evaluate the lower extremity deep venous systems from the level of the common femoral vein and including the common femoral, femoral, profunda femoral, popliteal and calf veins including the posterior tibial, peroneal and gastrocnemius veins when visible. The superficial great saphenous vein was also interrogated. Spectral Doppler was utilized to evaluate flow at rest and with distal augmentation maneuvers in the common femoral, femoral and popliteal veins. COMPARISON:  None Available. FINDINGS: RIGHT LOWER EXTREMITY Common Femoral Vein: No evidence of thrombus. Normal compressibility, respiratory phasicity and response to augmentation. Saphenofemoral Junction: No evidence of thrombus. Normal compressibility and flow on color Doppler imaging. Profunda Femoral Vein: No evidence of thrombus. Normal compressibility  and flow on color Doppler imaging. Femoral Vein: No evidence of thrombus. Normal compressibility, respiratory phasicity and response to augmentation. Popliteal Vein: No evidence of thrombus. Normal compressibility, respiratory phasicity and response to augmentation. Calf Veins: No evidence of thrombus. Normal compressibility and flow on color Doppler imaging. Other Findings:  None. LEFT LOWER EXTREMITY Common Femoral Vein: No evidence of thrombus. Normal compressibility, respiratory phasicity and response to augmentation. Saphenofemoral Junction: No evidence of thrombus. Normal compressibility and flow on color Doppler imaging. Profunda Femoral Vein: No evidence of thrombus. Normal compressibility and flow on color Doppler imaging. Femoral Vein: No evidence of thrombus. Normal compressibility, respiratory phasicity  and response to augmentation. Popliteal Vein: No evidence of thrombus. Normal compressibility, respiratory phasicity and response to augmentation. Calf Veins: No evidence of thrombus. Normal compressibility and flow on color Doppler imaging. Other Findings:  None. IMPRESSION: No evidence of deep venous thrombosis in either lower extremity. Electronically Signed   By: Camellia Candle M.D.   On: 03/30/2024 05:19   DG Chest Portable 1 View Result Date: 03/30/2024 CLINICAL DATA:  Frequent falls, increased weakness. EXAM: PORTABLE CHEST 1 VIEW COMPARISON:  PA Lat chest 02/06/2018.  Chest CT 07/21/2023. FINDINGS: Post treatment changes, fibrotic consolidation and volume loss are again noted in the right upper lobe. The lungs are emphysematous and otherwise clear. There is chronic right lateral sulcal blunting. Mediastinal configuration appear stable with aortic uncoiling and atherosclerosis. The cardiac size is normal. The left sulci are sharp. Thoracic cage appears intact. Slight thoracic dextroscoliosis. IMPRESSION: 1. No evidence of acute chest disease. 2. Emphysema. 3. Post treatment changes, fibrotic  consolidation and volume loss in the right upper lobe. 4. Aortic atherosclerosis. Electronically Signed   By: Francis Quam M.D.   On: 03/30/2024 03:02   CT Head Wo Contrast Result Date: 03/30/2024 EXAM: CT HEAD WITHOUT CONTRAST 03/30/2024 02:31:41 AM TECHNIQUE: CT of the head was performed without the administration of intravenous contrast. Automated exposure control, iterative reconstruction, and/or weight based adjustment of the mA/kV was utilized to reduce the radiation dose to as low as reasonably achievable. COMPARISON: MRI brain dated 02/21/2015. CLINICAL HISTORY: Head trauma, minor (Age >= 65y). Per ed notes; BIBA due to multiple falls - at least 3 per EMS. Reduced PO. Increased weakness. FINDINGS: BRAIN AND VENTRICLES: Global cortical atrophy. Subcortical and periventricular small vessel ischemic changes. No acute hemorrhage. Gray-white differentiation is preserved. No hydrocephalus. No extra-axial collection. No mass effect or midline shift. ORBITS: No acute abnormality. SINUSES: No acute abnormality. SOFT TISSUES AND SKULL: No acute soft tissue abnormality. No skull fracture. IMPRESSION: 1. No acute intracranial abnormality. 2. Atrophy with small vessel ischemic changes. Electronically signed by: Pinkie Pebbles MD 03/30/2024 02:39 AM EDT RP Workstation: [id number]    Labs: Basic Metabolic Panel: Recent Labs  Lab 04/16/24 0333 04/18/24 0442  NA 134* 138  K 3.7 3.6  CL 94* 97*  CO2 33* 35*  GLUCOSE 117* 82  BUN 11 15  CREATININE 0.94 0.82  CALCIUM  8.5* 8.6*   CBC: Recent Labs  Lab 04/16/24 0433 04/18/24 0442  WBC 9.3 7.8  NEUTROABS 8.1*  --   HGB 10.4* 9.5*  HCT 34.3* 30.4*  MCV 80.0 77.6*  PLT 316 249   Microbiology: Results for orders placed or performed during the hospital encounter of 04/16/24  Blood Culture (routine x 2)     Status: None   Collection Time: 04/16/24  4:33 AM   Specimen: BLOOD  Result Value Ref Range Status   Specimen Description BLOOD BLOOD  RIGHT ARM  Final   Special Requests   Final    BOTTLES DRAWN AEROBIC AND ANAEROBIC Blood Culture adequate volume   Culture   Final    NO GROWTH 5 DAYS Performed at [facility], [address], [place], [place] [address]    Report Status 04/21/2024 FINAL  Final  Blood Culture (routine x 2)     Status: None   Collection Time: 04/16/24  4:33 AM   Specimen: BLOOD  Result Value Ref Range Status   Specimen Description BLOOD BLOOD LEFT ARM  Final   Special Requests   Final    BOTTLES  DRAWN AEROBIC AND ANAEROBIC Blood Culture adequate volume   Culture   Final    NO GROWTH 5 DAYS Performed at [facility], [address] [place] [place], [place] [address]    Report Status 04/21/2024 FINAL  Final    Time coordinating discharge: Over 30 minutes  Marien LITTIE Piety, MD  [facility] 04/21/2024, 9:53 AM

## 2024-04-21 NOTE — Progress Notes (Signed)
 Mobility Specialist - Progress Note    04/21/24 1000  Therapy Vitals  BP 94/73  Patient Position (if appropriate) Standing  Mobility  Activity Ambulated with assistance;Stood at bedside;Dangled on edge of bed  Level of Assistance Contact guard assist, steadying assist  Assistive Device Front wheel walker  Range of Motion/Exercises Active  Activity Response Tolerated well  Mobility Referral Yes  Mobility visit 1 Mobility  Mobility Specialist Start Time (ACUTE ONLY) 1006  Mobility Specialist Stop Time (ACUTE ONLY) 1019  Mobility Specialist Time Calculation (min) (ACUTE ONLY) 13 min   Pt resting in bed on 2L upon entry. Pt STS MinA and patient endorses dizziness (BP 120/78) pt sits down for rest break for 3 minutes to recover. Pt stood again MinA same symptoms emerge and BP (94/73). Pt sat back down to recover breath and returned to bed. Pt left in bed with needs in reach. Bed alarm activated.   Guido Rumble Mobility Specialist 04/21/24, 11:19 AM

## 2024-04-21 NOTE — Progress Notes (Signed)
 Occupational Therapy Treatment Patient Details Name: Gregoire Bennis MRN: [id number] DOB: 1946/03/18 Today's Date: 04/21/2024   History of present illness Pt is a 78 y/o M who presented to ED with c/o SOB and wheezing. PMH signficant for COPD on 2L via [place] at baseline, HFpEF, squamous cell lung cancer in 2016, HTN, HLD, BPH, CAD.   OT comments  Pt seen for OT treatment on this date. Upon arrival to room pt seated EOB, agreeable to tx. Pt requires verbal cuing for safety awareness with ADL transfers, activity pacing/energy conservation, and breathing techniques during ADL engagement with increased fatigue noted during standing components of ADLs.  Vital monitored throughout and remained WFL.  Ongoing barrier to discharge is decreased activity tolerance, decreased functional balance during standing components of ADLs and ADL transfers, and decreased safety awareness resulting in ongoing fall risk. Pt making good progress toward goals, will continue to follow POC. Discharge recommendation remains appropriate.        If plan is discharge home, recommend the following:  A little help with walking and/or transfers;A lot of help with bathing/dressing/bathroom;Assistance with cooking/housework;Assist for transportation;Help with stairs or ramp for entrance   Equipment Recommendations  Other (comment) (defer to next venue of care)    Recommendations for Other Services      Precautions / Restrictions Precautions Precautions: Fall Recall of Precautions/Restrictions: Impaired Restrictions Weight Bearing Restrictions Per Provider Order: No       Mobility Bed Mobility               General bed mobility comments: Sitting EOB at start of session, seated in recliner at end of session    Transfers Overall transfer level: Needs assistance Equipment used: Rolling walker (2 wheels) Transfers: Sit to/from Stand Sit to Stand: Min assist, From elevated surface Stand pivot transfers: Contact  guard assist   Step pivot transfers: Contact guard assist           Balance Overall balance assessment: Needs assistance Sitting-balance support: Feet supported Sitting balance-Leahy Scale: Good Sitting balance - Comments: Steady reaching within BOS   Standing balance support: Single extremity supported, Bilateral upper extremity supported, During functional activity, No upper extremity supported, Reliant on assistive device for balance Standing balance-Leahy Scale: Fair Standing balance comment: Completed standing components of LB washing and perineal hygiene with bilateral hand use at times.                           ADL either performed or assessed with clinical judgement   ADL Overall ADL's : Needs assistance/impaired     Grooming: Wash/dry face;Sitting;Set up   Upper Body Bathing: Set up;Sitting   Lower Body Bathing: Contact guard assist;Sit to/from stand Lower Body Bathing Details (indicate cue type and reason): perineal wash up in standing, cuing for establishing BOS and energy conservation techniques due to increased fatigued during standing components - vitals monitored throughout. Upper Body Dressing : Minimal assistance;Contact guard assist;Sitting Upper Body Dressing Details (indicate cue type and reason): doff/don hospital gown cuing for energy conservation techniques due to increased fatigued during task engagement - vitals monitored throughout. Lower Body Dressing: Minimal assistance;Sit to/from stand Lower Body Dressing Details (indicate cue type and reason): Min A to doff/don brief/undergarment, verbal cuing for establishing BOS and energy conservation techniques due to increased fatigued during standing components - vitals monitored throughout. Toilet Transfer: Contact guard assist;Rolling walker (2 wheels) Toilet Transfer Details (indicate cue type and reason): Step pivot tranfser, simulated toilet  tranfer Toileting- Clothing Manipulation and Hygiene:  Contact guard assist;Sit to/from stand       Functional mobility during ADLs: Contact guard assist;Minimal assistance;Rolling walker (2 wheels);Cueing for sequencing      Extremity/Trunk Assessment Upper Extremity Assessment Upper Extremity Assessment: Generalized weakness            Vision       Perception     Praxis     Communication Communication Communication: No apparent difficulties   Cognition Arousal: Alert Behavior During Therapy: WFL for tasks assessed/performed Cognition: No apparent impairments                               Following commands: Intact        Cueing   Cueing Techniques: Verbal cues  Exercises Other Exercises Other Exercises: Completed standing components of LB washing and perineal hygiene with bilateral hand use at times, cuing for maintaining functional balance during standing components of task engagement, SpO2 Monitored throughout session Other Exercises: Education on energy conservation techniques due to fatiguing quickly during light bathing/dressing tasks. Other Exercises: Edcuation on breathing techniques during ADLs and activity pacing    Shoulder Instructions       General Comments      Pertinent Vitals/ Pain       Pain Assessment Pain Assessment: No/denies pain  Home Living                                          Prior Functioning/Environment              Frequency  Min 2X/week        Progress Toward Goals  OT Goals(current goals can now be found in the care plan section)  Progress towards OT goals: Progressing toward goals     Plan      Co-evaluation                 AM-PAC OT 6 Clicks Daily Activity     Outcome Measure   Help from another person eating meals?: None Help from another person taking care of personal grooming?: None Help from another person toileting, which includes using toliet, bedpan, or urinal?: A Little Help from another person bathing  (including washing, rinsing, drying)?: A Little Help from another person to put on and taking off regular upper body clothing?: A Little Help from another person to put on and taking off regular lower body clothing?: A Little 6 Click Score: 20    End of Session Equipment Utilized During Treatment: Rolling walker (2 wheels);Oxygen   OT Visit Diagnosis: Unsteadiness on feet (R26.81);Muscle weakness (generalized) (M62.81)   Activity Tolerance Patient tolerated treatment well   Patient Left in chair;with chair alarm set;with call bell/phone within reach   Nurse Communication Mobility status        Time: 0726-0759 OT Time Calculation (min): 33 min  Charges: OT General Charges $OT Visit: 1 Visit OT Treatments $Self Care/Home Management : 23-37 mins  Harlene Sharps OTR/L   Harlene LITTIE Sharps 04/21/2024, 8:11 AM

## 2024-04-21 NOTE — Progress Notes (Signed)
 SLP Cancellation Note  Patient Details Name: Colton Nguyen MRN: [id number] DOB: Feb 04, 1946   Cancelled treatment:       Reason Eval/Treat Not Completed:  (chart reviewed; consulted NSG re: pt's status and talked w/ pt.)  Per chart and NSG report, pt has been tolerating his current diet w/ no overt s/s of aspiration; no decline in respiratory status. NSG reported pt has been eating well; swallowing his pills well. Pt denied any swallowing issues/problems when asked. Pt is on a fairly Regular diet w/ cut meats, moistened foods; thin liquids w/ general aspiration precautions.  No further skilled ST services indicated currently. NSG/MD to reconsult if any decline in pt's status while admitted. NSG agreed. Pt agreed.     Comer Portugal, MS, CCC-SLP Speech Language Pathologist Rehab Services; [facility] [phone] (ascom) Gay Moncivais 04/21/2024, 10:08 AM

## 2024-04-21 NOTE — TOC Progression Note (Signed)
 Transition of Care [facility]) - Progression Note    Patient Details  Name: Colton Nguyen MRN: [id number] Date of Birth: Nov 20, 1945  Transition of Care [facility]) CM/SW Contact  Dalia GORMAN Fuse, RN Phone Number: 04/21/2024, 8:57 AM  Clinical Narrative:    TOC spoke with Tammy at [facility]. They can accept the patient today.   Expected Discharge Plan: Skilled Nursing Facility Barriers to Discharge: Continued Medical Work up               Expected Discharge Plan and Services   Discharge Planning Services: CM Consult   Living arrangements for the past 2 months: Single Family Home                                       Social Drivers of Health (SDOH) Interventions SDOH Screenings   Food Insecurity: No Food Insecurity (04/16/2024)  Housing: Low Risk  (04/16/2024)  Transportation Needs: No Transportation Needs (04/16/2024)  Utilities: Not At Risk (04/16/2024)  Depression (PHQ2-9): Low Risk  (01/30/2023)  Financial Resource Strain: Low Risk  (01/30/2023)  Social Connections: Socially Isolated (04/16/2024)  Tobacco Use: High Risk (04/16/2024)    Readmission Risk Interventions     No data to display

## 2024-04-21 NOTE — TOC Transition Note (Addendum)
 Transition of Care [facility]) - Discharge Note   Patient Details  Name: Colton Nguyen MRN: [id number] Date of Birth: [birth year]  Transition of Care [facility]) CM/SW Contact:  Dalia GORMAN Fuse, RN Phone Number: 04/21/2024, 11:00 AM   Clinical Narrative:     The patient is medically clear to discharge to [facility] for STR. The patient is agreeable with the discharge plan. DC Summary sent to Facility via the H[facility] to transport.  Final next level of care: Skilled Nursing Facility Barriers to Discharge: Barriers Resolved   Patient Goals and CMS Choice     Choice offered to / list presented to : Patient      Discharge Placement              Patient chooses bed at: [name] Patient to be transferred to facility by: [facility]   Patient and family notified of of transfer: 04/21/24  Discharge Plan and Services Additional resources added to the After Visit Summary for     Discharge Planning Services: CM Consult                                 Social Drivers of Health (SDOH) Interventions SDOH Screenings   Food Insecurity: No Food Insecurity (04/16/2024)  Housing: Low Risk  (04/16/2024)  Transportation Needs: No Transportation Needs (04/16/2024)  Utilities: Not At Risk (04/16/2024)  Depression (PHQ2-9): Low Risk  (01/30/2023)  Financial Resource Strain: Low Risk  (01/30/2023)  Social Connections: Socially Isolated (04/16/2024)  Tobacco Use: High Risk (04/16/2024)     Readmission Risk Interventions     No data to display

## 2024-04-21 NOTE — Plan of Care (Signed)

## 2024-04-21 NOTE — NC FL2 (Signed)
 [place]  MEDICAID FL2 LEVEL OF CARE FORM     IDENTIFICATION  Patient Name: Colton Nguyen Birthdate: March 25, 1946 Sex: male Admission Date (Current Location): 04/16/2024  [place] and [place] Number:  Chiropodist and Address:  [facility], [address] [place], [place] [address]      Provider Number: [id number]  Attending Physician Name and Address:  Lenon Marien CROME, MD  Relative Name and Phone Number:  Towana Abelson (Sister) [phone]    Current Level of Care: Hospital Recommended Level of Care: Skilled Nursing Facility Prior Approval Number:    Date Approved/Denied:   PASRR Number: [id number]  Discharge Plan: SNF    Current Diagnoses: Patient Active Problem List   Diagnosis Date Noted   Aspiration pneumonia (HCC) 04/16/2024   PNA (pneumonia) 04/16/2024   Restless leg syndrome 04/14/2024   Depression 04/14/2024   Protein-calorie malnutrition, severe 04/01/2024   COPD exacerbation (HCC) 03/30/2024   Prediabetes 01/30/2023   Chronic respiratory failure with hypoxia, on home O2 therapy (HCC) 01/28/2023   Iron deficiency anemia, unspecified 12/21/2019   Drug induced constipation 12/21/2019   Anxiety 12/21/2019   Tobacco use disorder 12/21/2019   Cognitive communication deficit 12/21/2019   Lumbar stenosis with neurogenic claudication 11/14/2019   BPH (benign prostatic hyperplasia) 08/01/2018   Hypertension 12/28/2016   COPD (chronic obstructive pulmonary disease) (HCC) 12/28/2016   Encounter for nail care 06/03/2015   Smoking trying to quit 04/01/2015   Squamous cell carcinoma of lung, stage III 03/07/2015   spiculated right upper lobe mass 02/14/2015    Orientation RESPIRATION BLADDER Height & Weight     Self, Time, Situation  O2 Incontinent, Continent Weight: 63 kg Height:  5' 8 (172.7 cm)  BEHAVIORAL SYMPTOMS/MOOD NEUROLOGICAL BOWEL NUTRITION STATUS      Continent Diet  AMBULATORY STATUS COMMUNICATION OF  NEEDS Skin   Limited Assist Verbally                         Personal Care Assistance Level of Assistance  Bathing, Feeding, Dressing Bathing Assistance: Limited assistance Feeding assistance: Limited assistance Dressing Assistance: Limited assistance     Functional Limitations Info  Sight, Hearing, Speech   Hearing Info: Adequate Speech Info: Adequate    SPECIAL CARE FACTORS FREQUENCY                       Contractures      Additional Factors Info  Code Status, Allergies Code Status Info: FULL Code Allergies Info: NKA           Current Medications (04/21/2024):  This is the current hospital active medication list Current Facility-Administered Medications  Medication Dose Route Frequency Provider Last Rate Last Admin   albuterol  (PROVENTIL ) (2.5 MG/3ML) 0.083% nebulizer solution 3 mL  3 mL Nebulization Q6H PRN Laurita Cort DASEN, MD       aspirin  EC tablet 81 mg  81 mg Oral Daily Laurita Cort T, MD   81 mg at 04/21/24 0854   atorvastatin  (LIPITOR) tablet 40 mg  40 mg Oral Daily Laurita Cort T, MD   40 mg at 04/21/24 0855   azelastine  (ASTELIN ) 0.1 % nasal spray 1 spray  1 spray Each Nare BID Laurita Cort DASEN, MD   1 spray at 04/21/24 9146   budesonide -glycopyrrolate -formoterol  (BREZTRI ) 160-9-4.8 MCG/ACT inhaler 2 puff  2 puff Inhalation BID Laurita Cort DASEN, MD   2 puff at 04/21/24 0856   buPROPion  (WELLBUTRIN  SR)  12 hr tablet 150 mg  150 mg Oral BID Laurita Manor T, MD   150 mg at 04/21/24 0854   cyclobenzaprine  (FLEXERIL ) tablet 5 mg  5 mg Oral TID PRN Laurita Manor DASEN, MD       enoxaparin  (LOVENOX ) injection 40 mg  40 mg Subcutaneous Q24H Laurita Manor T, MD   40 mg at 04/20/24 2042   finasteride  (PROSCAR ) tablet 5 mg  5 mg Oral Daily Laurita Manor T, MD   5 mg at 04/21/24 9144   fluticasone  (FLONASE ) 50 MCG/ACT nasal spray 2 spray  2 spray Each Nare Daily PRN Laurita Manor T, MD       furosemide  (LASIX ) tablet 20 mg  20 mg Oral Daily Laurita Manor T, MD   20 mg at 04/21/24 9144    guaiFENesin  (MUCINEX ) 12 hr tablet 1,200 mg  1,200 mg Oral BID Laurita Manor T, MD   1,200 mg at 04/21/24 9144   LORazepam  (ATIVAN ) tablet 0.5 mg  0.5 mg Oral Daily PRN Laurita Manor T, MD       losartan  (COZAAR ) tablet 50 mg  50 mg Oral Daily Laurita Manor T, MD   50 mg at 04/21/24 0855   nicotine  (NICODERM CQ  - dosed in mg/24 hours) patch 21 mg  21 mg Transdermal Q24H Laurita Manor T, MD   21 mg at 04/21/24 0856   pantoprazole  (PROTONIX ) EC tablet 40 mg  40 mg Oral BID AC Zhang, Ping T, MD   40 mg at 04/21/24 9144   rOPINIRole  (REQUIP ) tablet 0.25 mg  0.25 mg Oral TID Laurita Manor T, MD   0.25 mg at 04/21/24 9144   tamsulosin  (FLOMAX ) capsule 0.4 mg  0.4 mg Oral Daily Laurita Manor T, MD   0.4 mg at 04/21/24 9144   traMADol  (ULTRAM ) tablet 50 mg  50 mg Oral Q12H PRN Laurita Manor DASEN, MD         Discharge Medications: Please see discharge summary for a list of discharge medications.  Relevant Imaging Results:  Relevant Lab Results:   Additional Information SSN [id number]  Dalia GORMAN Fuse, RN

## 2024-05-01 ENCOUNTER — Other Ambulatory Visit: Payer: Self-pay | Admitting: Student in an Organized Health Care Education/Training Program

## 2024-05-03 ENCOUNTER — Emergency Department

## 2024-05-03 ENCOUNTER — Emergency Department
Admission: EM | Admit: 2024-05-03 | Discharge: 2024-05-03 | Disposition: A | Discharge status: Home / Self Care | Attending: Emergency Medicine | Admitting: Emergency Medicine

## 2024-05-03 DIAGNOSIS — W19XXXA Unspecified fall, initial encounter: Secondary | ICD-10-CM

## 2024-05-03 DIAGNOSIS — T148XXA Other injury of unspecified body region, initial encounter: Secondary | ICD-10-CM

## 2024-05-03 DIAGNOSIS — J449 Chronic obstructive pulmonary disease, unspecified: Secondary | ICD-10-CM | POA: Diagnosis not present

## 2024-05-03 DIAGNOSIS — S0990XA Unspecified injury of head, initial encounter: Secondary | ICD-10-CM | POA: Diagnosis present

## 2024-05-03 DIAGNOSIS — W01198A Fall on same level from slipping, tripping and stumbling with subsequent striking against other object, initial encounter: Secondary | ICD-10-CM | POA: Diagnosis not present

## 2024-05-03 DIAGNOSIS — S0101XA Laceration without foreign body of scalp, initial encounter: Secondary | ICD-10-CM | POA: Diagnosis not present

## 2024-05-03 MED ORDER — TETANUS-DIPHTH-ACELL PERTUSSIS 5-2.5-18.5 LF-MCG/0.5 IM SUSY: 0.5000 mL | PREFILLED_SYRINGE | Freq: Once | INTRAMUSCULAR | Status: DC

## 2024-05-03 NOTE — ED Notes (Signed)
 Report given to Vilic[place], RN

## 2024-05-03 NOTE — ED Notes (Signed)
 Pt arrived with ems got vitals placed fall bundle and assisted with bathroom

## 2024-05-03 NOTE — ED Provider Notes (Addendum)
 [facility] Provider Note    Event Date/Time   First MD Initiated Contact with Patient 05/03/24 [phone]     (approximate)   History   No chief complaint on file.   HPI  Colton Nguyen is a 78 y.o. male with history of COPD on 2 L at baseline who comes in with concerns for a fall.  Patient reports that he slipped and fell and hit his head on the headboard of his bed.  He denies any chest pain, shortness of breath, LOC.  He reports that it was a mechanical fall. He denies any other symptoms.  He does have a hematoma to his head which is why he was sent here to the emergency room.   Physical Exam   Triage Vital Signs: ED Triage Vitals  Encounter Vitals Group     BP      Girls Systolic BP Percentile      Girls Diastolic BP Percentile      Boys Systolic BP Percentile      Boys Diastolic BP Percentile      Pulse      Resp      Temp      Temp src      SpO2      Weight      Height      Head Circumference      [facility] Flow      Pain Score      Pain Loc      Pain Education      Exclude from Growth Chart     Most recent vital signs: There were no vitals filed for this visit.   General: Awake, no distress.  CV:  Good peripheral perfusion.  Resp:  Normal effort.  Abd:  No distention.  Other:  Hematoma to the forehead.  Small abrasion on top of the hematoma no chest wall tenderness no abdominal tenderness moving all extremities well.   ED Results / Procedures / Treatments   Labs (all labs ordered are listed, but only abnormal results are displayed) Labs Reviewed - No data to display    RADIOLOGY I have reviewed the ct personally and interpreted no intracranial hemorrhage   PROCEDURES:  Critical Care performed: No  .Laceration Repair  Date/Time: 05/03/2024 2:13 AM  Performed by: Ernest Ronal BRAVO, MD Authorized by: Ernest Ronal BRAVO, MD   Consent:    Consent obtained:  Verbal   Risks discussed:  Infection and pain   Alternatives  discussed:  No treatment Universal protocol:    Patient identity confirmed:  Verbally with patient Laceration details:    Location:  Scalp   Scalp location:  Frontal   Length (cm):  1 Treatment:    Irrigation method:  Pressure wash Skin repair:    Repair method:  Tissue adhesive Post-procedure details:    Dressing:  Open (no dressing)   Procedure completion:  Tolerated well, no immediate complications    MEDICATIONS ORDERED IN ED: Medications - No data to display   IMPRESSION / MDM / ASSESSMENT AND PLAN / ED COURSE  I reviewed the triage vital signs and the nursing notes.   Patient's presentation is most consistent with acute presentation with potential threat to life or bodily function.   Patient comes in with a mechanical fall after slipping.  He does report hitting his head.  He denies any weakness, chest pain, syncope, shortness of breath.  His vitals are otherwise stable on his normal 2 L.  Discussed with patient given no other symptoms no indication for blood work, EKG.  CT imaging ordered evaluate for intercranial hemorrhage, cervical fracture.  IMPRESSION: 1. No acute intracranial abnormality. 2. No acute displaced fracture or traumatic listhesis of the cervical spine. 3.  Emphysema (ICD10-J43.9). 4. A 1.5 cm left frontal scalp hematoma.  Will update patients tetanus as I do not see that once been done recently.  Patient will be discharged back to facility and he expressed understanding and felt comfortable with plan.  Repeat exam he continues to deny any chest pain, abdominal pain or any extremity pain.  Patient did have a little bit of blood pooling on the abrasion after had cleaned it therefore he cleaned it and did put a little glue on top of it to help prevent recurrent bleeding.    FINAL CLINICAL IMPRESSION(S) / ED DIAGNOSES   Final diagnoses:  Fall, initial encounter  Hematoma     Rx / DC Orders   ED Discharge Orders     None        Note:   This document was prepared using Dragon voice recognition software and may include unintentional dictation errors.   Ernest Ronal BRAVO, MD 05/03/24 [place]    Ernest Ronal BRAVO, MD 05/03/24 [phone]

## 2024-05-03 NOTE — ED Triage Notes (Signed)
 Pt arrived to ED via [facility] for a mechanical fall. Pt states he slipped and hit his head on the headboard of his bed. Pt not on any blood thinners. Pt denies any pain but only on his head. A&Ox4. 100% on 2L [place] @ baseline. Pt has MOST form at bedside.

## 2024-05-03 NOTE — Discharge Instructions (Signed)
 Return to the ER if you develop worsening symptoms or any other concerns  IMPRESSION: 1. No acute intracranial abnormality. 2. No acute displaced fracture or traumatic listhesis of the cervical spine. 3.  Emphysema (ICD10-J43.9). 4. A 1.5 cm left frontal scalp hematoma.

## 2024-05-04 ENCOUNTER — Other Ambulatory Visit: Payer: Self-pay | Admitting: Student in an Organized Health Care Education/Training Program

## 2024-05-04 ENCOUNTER — Ambulatory Visit (INDEPENDENT_AMBULATORY_CARE_PROVIDER_SITE_OTHER): Payer: Self-pay | Admitting: Podiatry

## 2024-05-04 DIAGNOSIS — Z91199 Patient's noncompliance with other medical treatment and regimen due to unspecified reason: Secondary | ICD-10-CM

## 2024-05-04 NOTE — Progress Notes (Signed)
 1. No-show for appointment    Patient in ED yesterday due to fall.

## 2024-05-05 ENCOUNTER — Other Ambulatory Visit: Payer: Self-pay | Admitting: Family

## 2024-05-05 DIAGNOSIS — N401 Enlarged prostate with lower urinary tract symptoms: Secondary | ICD-10-CM

## 2024-05-10 ENCOUNTER — Other Ambulatory Visit: Payer: Self-pay | Admitting: Cardiology

## 2024-05-17 ENCOUNTER — Ambulatory Visit: Admitting: Family

## 2024-05-22 ENCOUNTER — Encounter: Payer: Self-pay | Admitting: Pulmonary Disease

## 2024-05-22 ENCOUNTER — Ambulatory Visit: Admitting: Pulmonary Disease

## 2024-05-22 VITALS — BP 140/98 | HR 103 | Temp 97.9°F | Ht 68.0 in | Wt 133.8 lb

## 2024-05-22 DIAGNOSIS — F17210 Nicotine dependence, cigarettes, uncomplicated: Secondary | ICD-10-CM

## 2024-05-22 DIAGNOSIS — C3411 Malignant neoplasm of upper lobe, right bronchus or lung: Secondary | ICD-10-CM

## 2024-05-22 DIAGNOSIS — K219 Gastro-esophageal reflux disease without esophagitis: Secondary | ICD-10-CM | POA: Diagnosis not present

## 2024-05-22 DIAGNOSIS — J9611 Chronic respiratory failure with hypoxia: Secondary | ICD-10-CM

## 2024-05-22 DIAGNOSIS — R0602 Shortness of breath: Secondary | ICD-10-CM

## 2024-05-22 DIAGNOSIS — Z9981 Dependence on supplemental oxygen: Secondary | ICD-10-CM

## 2024-05-22 DIAGNOSIS — J439 Emphysema, unspecified: Secondary | ICD-10-CM | POA: Diagnosis not present

## 2024-05-22 DIAGNOSIS — J441 Chronic obstructive pulmonary disease with (acute) exacerbation: Secondary | ICD-10-CM | POA: Diagnosis not present

## 2024-05-22 MED ORDER — AZITHROMYCIN 500 MG PO TABS: 500.0000 mg | ORAL_TABLET | Freq: Every day | ORAL | 0 refills | Status: AC

## 2024-05-22 MED ORDER — PANTOPRAZOLE SODIUM 40 MG PO TBEC: 40.0000 mg | DELAYED_RELEASE_TABLET | Freq: Two times a day (BID) | ORAL | Status: DC

## 2024-05-22 MED ORDER — IPRATROPIUM-ALBUTEROL 0.5-2.5 (3) MG/3ML IN SOLN: 3.0000 mL | Freq: Four times a day (QID) | RESPIRATORY_TRACT | 11 refills | Status: DC

## 2024-05-22 MED ORDER — PANTOPRAZOLE SODIUM 40 MG PO TBEC: 40.0000 mg | DELAYED_RELEASE_TABLET | Freq: Two times a day (BID) | ORAL | 3 refills | Status: DC

## 2024-05-22 MED ORDER — IPRATROPIUM-ALBUTEROL 0.5-2.5 (3) MG/3ML IN SOLN
3.0000 mL | Freq: Once | RESPIRATORY_TRACT | Status: AC
  Administered 2024-05-22: 3 mL via RESPIRATORY_TRACT

## 2024-05-22 MED ORDER — METHYLPREDNISOLONE 4 MG PO TBPK: ORAL_TABLET | ORAL | 0 refills | Status: DC

## 2024-05-22 NOTE — Progress Notes (Signed)
 Subjective:    Patient ID: Colton Nguyen, male    DOB: [birth year], 78 y.o.   MRN: [id number]  Patient Care Team: [name], FNP as PCP - General (Family Medicine) Fernand Denyse LABOR, MD as Consulting Physician (Cardiology) Army Dallas NOVAK, MD (Inactive) as Consulting Physician (Cardiothoracic Surgery)  Chief Complaint  Patient presents with   Consult    Shortness of breath on exertion and at rest.     BACKGROUND: Patient is a 78 year old current heavy cigarette smoker who presents for evaluation of shortness of breath on exertion and at rest.  He is kindly referred by Colton Pollen, NP.  HPI Discussed the use of AI scribe software for clinical note transcription with the patient, who gave verbal consent to proceed.  History of Present Illness   Colton Nguyen is a 78 year old male with COPD who presents with shortness of breath.  He presents with Colton Nguyen, his sister.  He experiences shortness of breath that varies in intensity, with some days being better than others. This symptom has persisted for several years. He did not have his breathing treatment on the morning of the visit.  He has been on oxygen  therapy for two to three years, initiated by a previous primary healthcare provider. He uses a nebulizer at home with albuterol  as the medication. He also uses Trelegy and a nasal spray as part of his treatment regimen. He feels that the Trelegy helps, but he is unsure if it is as effective as the nebulizer treatment.  He was recently hospitalized for pneumonia. No issues with swallowing or reflux, although a recent scan indicated potential esophageal issues that could lead to reflux.  He does have significant gastroesophageal reflux and has run out of his PPI which help with the symptoms.  He describes frequent belching.  He has not been coughing up any sputum.  He continues to smoke approximately 1 to 2 packs of cigarettes a day, despite having used nicotine  patches  during a recent stay in a nursing home where he was smoke-free for thirty days. He has not received a flu shot this year.    He resides with his son in [place].  He is retired.  Review of his past medical and surgical history shows that he had a right upper lobe lung biopsy by Dr. 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  He had concurrent chemoradiation with was determined to be partial response followed by consolidation systemic chemotherapy.  He has been followed by Dr. Sherrod Sherrod.   Review of Systems A 10 point review of systems was performed and it is as noted above otherwise negative.   Past Medical History:  Diagnosis Date   Acute respiratory failure with hypoxia (HCC) 01/28/2023   Anxiety    situational   Arthritis    BPH (benign prostatic hyperplasia)    CAD (coronary artery disease)    CHF (congestive heart failure) (HCC)    pt denies 11/13/2019   Chronic cardiopulmonary disease (HCC)    COPD (chronic obstructive pulmonary disease) (HCC)    History of kidney stones    Hyperlipidemia    Hypertension    Hypertension 12/28/2016   lung ca dx'd 03/2015   Mass of lung    Nicotine  dependence    Personal history of kidney stones    Restless leg    Shortness of breath dyspnea  with exertion   Tuberculosis    Wears glasses     Past Surgical History:  Procedure Laterality Date   BACK SURGERY     BIOPSY  01/25/2020   Procedure: BIOPSY;  Surgeon: Legrand Victory LITTIE DOUGLAS, MD;  Location: [facility];  Service: Gastroenterology;;   CAD CCTA 01/31/15     CERVICAL FUSION     COLONOSCOPY     COLONOSCOPY WITH PROPOFOL  N/A 01/25/2020   Procedure: COLONOSCOPY WITH PROPOFOL ;  Surgeon: Legrand Victory LITTIE DOUGLAS, MD;  Location: [facility];  Service: Gastroenterology;  Laterality: N/A;   ESOPHAGOGASTRODUODENOSCOPY (EGD) WITH PROPOFOL  N/A 01/25/2020   Procedure: ESOPHAGOGASTRODUODENOSCOPY (EGD) WITH PROPOFOL ;   Surgeon: Legrand Victory LITTIE DOUGLAS, MD;  Location: [facility];  Service: Gastroenterology;  Laterality: N/A;   FRACTURE SURGERY     left ankle   HEMOSTASIS CLIP PLACEMENT  01/25/2020   Procedure: HEMOSTASIS CLIP PLACEMENT;  Surgeon: Legrand Victory LITTIE DOUGLAS, MD;  Location: [facility];  Service: Gastroenterology;;   HOT HEMOSTASIS N/A 01/25/2020   Procedure: HOT HEMOSTASIS (ARGON PLASMA COAGULATION/BICAP);  Surgeon: Legrand Victory LITTIE DOUGLAS, MD;  Location: [facility];  Service: Gastroenterology;  Laterality: N/A;   LUNG BIOPSY N/A 02/27/2015   Procedure: LUNG BIOPSY;  Surgeon: Dallas KATHEE Jude, MD;  Location: [facility];  Service: Thoracic;  Laterality: N/A;   right inguinal hernia repair at age 82     VIDEO BRONCHOSCOPY WITH ENDOBRONCHIAL ULTRASOUND N/A 02/27/2015   Procedure: VIDEO BRONCHOSCOPY WITH ENDOBRONCHIAL ULTRASOUND;  Surgeon: Dallas KATHEE Jude, MD;  Location: [facility] OR;  Service: Thoracic;  Laterality: N/A;    Patient Active Problem List   Diagnosis Date Noted   Aspiration pneumonia (HCC) 04/16/2024   PNA (pneumonia) 04/16/2024   Restless leg syndrome 04/14/2024   Depression 04/14/2024   Protein-calorie malnutrition, severe 04/01/2024   COPD exacerbation (HCC) 03/30/2024   Prediabetes 01/30/2023   Chronic respiratory failure with hypoxia, on home O2 therapy (HCC) 01/28/2023   Iron deficiency anemia, unspecified 12/21/2019   Drug induced constipation 12/21/2019   Anxiety 12/21/2019   Tobacco use disorder 12/21/2019   Cognitive communication deficit 12/21/2019   Lumbar stenosis with neurogenic claudication 11/14/2019   BPH (benign prostatic hyperplasia) 08/01/2018   Hypertension 12/28/2016   COPD (chronic obstructive pulmonary disease) (HCC) 12/28/2016   Encounter for nail care 06/03/2015   Smoking trying to quit 04/01/2015   Squamous cell carcinoma of lung, stage III 03/07/2015   spiculated right upper lobe mass 02/14/2015    Family History  Problem Relation Age of Onset   Cancer Other     Heart disease Father    Lung cancer Father    Hypertension Other    Diabetes Mother    Valvular heart disease Mother    Diabetes Sister    Colon polyps Maternal Uncle    Diabetes Sister    Diabetes Sister    Diabetes Niece    Colon cancer Cousin    Esophageal cancer Neg Hx    Stomach cancer Neg Hx    Pancreatic cancer Neg Hx     Social History   Tobacco Use   Smoking status: Every Day    Current packs/day: 2.50    Average packs/day: 2.5 packs/day for 56.3 years (140.8 ttl pk-yrs)    Types: Cigarettes    Start date: 02/14/1968    Passive exposure: Current   Smokeless tobacco: Never  Substance Use Topics   Alcohol use: Yes    Alcohol/week: 0.0 standard drinks of alcohol    Comment: ocassional  No Known Allergies  Current Meds  Medication Sig   acetaminophen  (TYLENOL ) 500 MG tablet Take 1,000 mg by mouth every 6 (six) hours as needed for moderate pain or headache.   albuterol  (VENTOLIN  HFA) 108 (90 Base) MCG/ACT inhaler Inhale 1-2 puffs into the lungs every 4 (four) hours as needed for wheezing or shortness of breath.   aspirin  81 MG tablet Take 81 mg by mouth daily.   atorvastatin  (LIPITOR) 40 MG tablet Take 1 tablet (40 mg total) by mouth daily.   azelastine  (ASTELIN ) 0.1 % nasal spray Place 1 spray into both nostrils 2 (two) times daily.   [EXPIRED] azithromycin  (ZITHROMAX ) 500 MG tablet Take 1 tablet (500 mg total) by mouth daily for 3 days. Take 1 tablet daily for 3 days.   buPROPion  (WELLBUTRIN  SR) 150 MG 12 hr tablet Take 1 tablet (150 mg total) by mouth 2 (two) times daily.   Cholecalciferol  (VITAMIN D3) 5000 UNITS TABS Take 5,000 Units by mouth daily.   cyclobenzaprine  (FLEXERIL ) 5 MG tablet TAKE ONE TABLET (5 MG TOTAL) BY MOUTH THREE TIMES DAILY AS NEEDED FOR MUSCLE SPASMS.   ferrous sulfate  325 (65 FE) MG tablet Take 1 tablet (325 mg total) by mouth daily with breakfast.   finasteride  (PROSCAR ) 5 MG tablet Take 1 tablet (5 mg total) by mouth daily.   fluticasone   (FLONASE ) 50 MCG/ACT nasal spray Place 2 sprays into both nostrils daily as needed for allergies.    ipratropium-albuterol  (DUONEB) 0.5-2.5 (3) MG/3ML SOLN Take 3 mLs by nebulization 4 (four) times daily.   losartan  (COZAAR ) 50 MG tablet Take 1 tablet (50 mg total) by mouth daily.   methylPREDNISolone  (MEDROL  DOSEPAK) 4 MG TBPK tablet Use as directed in the package.  This is a taper pack.   nicotine  (NICODERM CQ  - DOSED IN MG/24 HOURS) 21 mg/24hr patch Place 1 patch (21 mg total) onto the skin daily.   rOPINIRole  (REQUIP ) 0.25 MG tablet TAKE ONE TABLET (0.25 MG TOTAL) BY MOUTH THREE (THREE) TIMES DAILY.   tamsulosin  (FLOMAX ) 0.4 MG CAPS capsule TAKE ONE CAPSULE (0.4 MG TOTAL) BY MOUTH DAILY. (NEEDS APPT FOR FUTURE FILL)   [DISCONTINUED] Fluticasone -Umeclidin-Vilant (TRELEGY ELLIPTA ) 100-62.5-25 MCG/ACT AEPB Inhale 1 puff into the lungs daily.    Immunization History  Administered Date(s) Administered   Fluad Trivalent(High Dose 65+) 08/09/2023   Influenza-Unspecified 08/05/2015   PFIZER Comirnaty(Gray Top)Covid-19 Tri-Sucrose Vaccine 01/10/2021   Zoster Recombinant(Shingrix) 06/30/2017, 09/22/2017        Objective:     BP (!) 140/98   Pulse (!) 103   Temp 97.9 F (36.6 C) (Temporal)   Ht 5' 8 (1.727 m)   Wt 133 lb 12.8 oz (60.7 kg)   SpO2 91% Comment: 2L oxygen  (poc)  BMI 20.34 kg/m   SpO2: 91 % (2L oxygen  (poc))  GENERAL: Thin, well-developed gentleman, mild use of accessory show respiration.  Appears comfortable with nasal cannula O2 in place.  No conversational dyspnea. HEAD: Normocephalic, atraumatic.  EYES: Pupils equal, round, reactive to light.  No scleral icterus.  MOUTH: Poor dentition, oral mucosa moist.  No thrush. NECK: Supple. No thyromegaly. Trachea midline. No JVD.  No adenopathy. PULMONARY: Distant breath sounds.  Coarse, scattered wheezes and rhonchi noted.  Positive Hoover's sign. CARDIOVASCULAR: S1 and S2. Regular rate and rhythm.  No rubs, murmurs or  gallops heard. ABDOMEN: Benign. MUSCULOSKELETAL: No joint deformity, no clubbing, no edema.  NEUROLOGIC: No overt focal deficit, no gait disturbance, speech is fluent. SKIN: Intact,warm,dry. PSYCH: Mood and behavior  normal.  Patient received DuoNeb x 1: Improvement in wheezing and rhonchi after nebulization treatment.  Assessment & Plan:     ICD-10-CM   1. Chronic obstructive pulmonary disease with emphysema, unspecified emphysema type (HCC)  J43.9     2. Shortness of breath  R06.02 ipratropium-albuterol  (DUONEB) 0.5-2.5 (3) MG/3ML nebulizer solution 3 mL    3. Chronic respiratory failure with hypoxia, on home O2 therapy (HCC)  J96.11    Z99.81     4. Tobacco dependence due to cigarettes  F17.210       Meds ordered this encounter  Medications   ipratropium-albuterol  (DUONEB) 0.5-2.5 (3) MG/3ML nebulizer solution 3 mL   azithromycin  (ZITHROMAX ) 500 MG tablet    Sig: Take 1 tablet (500 mg total) by mouth daily for 3 days. Take 1 tablet daily for 3 days.    Dispense:  3 tablet    Refill:  0   methylPREDNISolone  (MEDROL  DOSEPAK) 4 MG TBPK tablet    Sig: Use as directed in the package.  This is a taper pack.    Dispense:  21 tablet    Refill:  0   ipratropium-albuterol  (DUONEB) 0.5-2.5 (3) MG/3ML SOLN    Sig: Take 3 mLs by nebulization 4 (four) times daily.    Dispense:  360 mL    Refill:  11   pantoprazole  (PROTONIX ) 40 MG tablet    Sig: Take 1 tablet (40 mg total) by mouth 2 (two) times daily before a meal.    Dispense:  60 tablet    Refill:  3    Discussion:    Chronic obstructive pulmonary disease with emphysema, acute exacerbation Acute exacerbation of COPD with emphysema, characterized by increased dyspnea. Recent hospitalization for pneumonia, suspected due to aspiration. Current treatment includes albuterol  nebulizer and Trelegy, but Trelegy may not be effectively reaching the lungs. Wheezing present on examination. No recent use of prednisone . No productive cough  reported. - Administer albuterol  nebulizer treatment in office - Discontinue Trelegy - Prescribe Duoneb (ipratropium and albuterol ) for nebulization four times daily - Prescribe prednisone  for wheezing - Prescribe azithromycin  for three days to address inflammation and prevent hospitalization - Plan for future pulmonary function tests once condition stabilizes  Tobacco dependence Continues to smoke approximately one pack per day. Previous successful use of nicotine  patches during a nursing home stay. - Advise use of nicotine  patches, applying in the morning and removing at night to avoid sleep disturbances  Gastroesophageal reflux disease Potential reflux issue with esophageal dysfunction noted on recent CT scan. Symptoms include belching. Risk of aspiration into lungs due to esophageal motility issues. - Prescribe proton pump inhibitor (PPI) to prevent acid reflux into the lungs   Smoking/Tobacco Cessation Counseling Colton Nguyen is a current user of tobacco or nicotine  products. He is considering quitting at this time. Counseling provided today addressed the risks of continued use and the benefits of cessation. Discussed tobacco/nicotine  use history, readiness to quit, and evidence-based treatment options including behavioral strategies, support resources, and pharmacologic therapies. Provided encouragement and educational materials on steps and resources to quit smoking. Patient questions were addressed, and follow-up recommended for continued support. Total time spent on counseling: 6 minutes.  .     Advised if symptoms do not improve or worsen, to please contact office for sooner follow up or seek emergency care.    I spent 60 minutes of dedicated to the care of this patient on the date of this encounter to include pre-visit review of records, face-to-face time  with the patient discussing conditions above, post visit ordering of testing, clinical documentation with the electronic  health record, making appropriate referrals as documented, and communicating necessary findings to members of the patients care team.   C. Leita Sanders, MD Advanced Bronchoscopy PCCM [facility]    *This note was dictated using voice recognition software/Dragon.  Despite best efforts to proofread, errors can occur which can change the meaning. Any transcriptional errors that result from this process are unintentional and may not be fully corrected at the time of dictation.

## 2024-05-22 NOTE — Patient Instructions (Signed)
 VISIT SUMMARY:  During your visit, we discussed your ongoing issues with COPD and shortness of breath, your smoking habits, and potential reflux issues. We made some changes to your treatment plan to help manage your symptoms more effectively.  YOUR PLAN:  -CHRONIC OBSTRUCTIVE PULMONARY DISEASE WITH EMPHYSEMA, ACUTE EXACERBATION: This condition involves chronic lung damage that makes it hard to breathe, and you are currently experiencing a worsening of symptoms. We administered an albuterol  nebulizer treatment in the office and have decided to discontinue Trelegy. Instead, you will use Duoneb (a combination of ipratropium and albuterol ) for nebulization four times daily. Additionally, you will take prednisone  to help with wheezing and azithromycin  for three days to reduce inflammation and prevent hospitalization. We will plan for future pulmonary function tests once your condition stabilizes.  -TOBACCO DEPENDENCE: This refers to your ongoing smoking habit. We recommend using nicotine  patches, applying them in the morning and removing them at night to avoid sleep disturbances.  -GASTROESOPHAGEAL REFLUX DISEASE: This condition involves stomach acid flowing back into your esophagus, which can cause belching and potentially lead to lung issues. We have prescribed a proton pump inhibitor (PPI) to prevent acid reflux into your lungs.  INSTRUCTIONS:  Please follow the new medication regimen as discussed. Use the nicotine  patches as advised to help quit smoking. We will schedule pulmonary function tests once your condition stabilizes. If you experience any worsening of symptoms or have any concerns, please contact our office.

## 2024-05-23 ENCOUNTER — Telehealth: Payer: Self-pay

## 2024-05-23 NOTE — Telephone Encounter (Signed)
*  Pulm  Pharmacy Patient Advocate Encounter   Received notification from CoverMyMeds that prior authorization for Ipratropium-Albuterol  0.5-2.5 (3)MG/3ML solution  is required/requested.   Insurance verification completed.   The patient is insured through [facility] .   Per test claim: Medication is not eligible for pharmacy benefits and must be billed through medical insurance. As our team only handles pharmacy related prior auths, medical PA's must be submitted by the clinic. Thank you

## 2024-05-25 ENCOUNTER — Telehealth: Payer: Self-pay | Admitting: Family

## 2024-05-25 NOTE — Telephone Encounter (Signed)
 Left VM requesting verbal orders for OT 1w5, then phone call the following week, then 1w2.  Per Alan verbal, okay for these home health orders.  Rodger [phone]

## 2024-05-26 ENCOUNTER — Other Ambulatory Visit: Payer: Self-pay | Admitting: *Deleted

## 2024-05-26 NOTE — Telephone Encounter (Signed)
 Colton Nguyen from [facility] called requesting orders for social work for caregiver support. Also requesting nursing orders for 2w2, 1w6.   Verbal orders given to [place].

## 2024-05-26 NOTE — Patient Outreach (Signed)
 EMMI follow up call to Mr. Detweiler. Mr. Archambeau reports he has seen his pulmonologist post SNF. States he has PCP appt scheduled in October. [facility] home [facility] is working with him currently. Denies [facility] CCM needs at this time. Expressed appreciation of the call.   GENERAL DISCHARGE JOURNEY (IVR) [facility] - [facility]  Encounter ID: [id number] Patient ID: [id number]  Series Access Code: [id number] Enrolled Date: 05/20/24  Call Language: ENGLISH Start Date: 05/21/24  Details Sun 05/21/24   [phone] Day # 1   PATIENT GENERAL DISCHARGE OVERVIEW   Time of Last Call Attempt 10:41 AM   Who reached Patient   Scheduled follow-up? No   Did medication routine change? No   Any wounds from surgery? No   Program: PATIENT SATISFACTION: AFTER DISCHARGE EXPECTATIONS Issued     Pablo Hurst, MSN, RN, BSN [facility], [facility] RN Post- Acute Care Manager Direct Dial: [phone]

## 2024-05-30 ENCOUNTER — Encounter: Payer: Self-pay | Admitting: Physician Assistant

## 2024-06-02 ENCOUNTER — Telehealth: Payer: Self-pay | Admitting: Family

## 2024-06-02 NOTE — Telephone Encounter (Signed)
 Nena, nurse with [facility], left VM stating that patient has been weighing himself daily and over the last couple of days he has had a 6 pound weight gain. He is not on any PRN fluid pills, only his daily Lasix . Please advise on what to do.

## 2024-06-06 MED ORDER — FUROSEMIDE 20 MG PO TABS: 40.0000 mg | ORAL_TABLET | Freq: Every day | ORAL | 0 refills | Status: DC

## 2024-06-06 NOTE — Telephone Encounter (Signed)
 Bunnie called again and I spoke with her regarding this patient. She states he has now gained 12 lbs since 9/18. This is an urgent matter as patient is only on furosemide  20 mg once daily. The nurse is requesting we send him in a new fluid pill or making adjustments to his fluid pills at this time. Please advise on what to do. Possible cardiology referral as well.  Bunnie [phone]

## 2024-06-07 ENCOUNTER — Telehealth: Payer: Self-pay | Admitting: Family

## 2024-06-07 ENCOUNTER — Telehealth: Payer: Self-pay

## 2024-06-07 NOTE — Telephone Encounter (Signed)
 Pt had a fall Sunday, Tuesday, and today. No injuries.

## 2024-06-07 NOTE — Telephone Encounter (Signed)
 Tammy, therapist with [facility], left VM to report falls. States the patient fell in the bathroom this morning but did not get injured. She also states that he slid out of his chair on Sunday and Tuesday but did not get injured either of those times either.   I verbally told Alan about the patients falls and she said she appreciates them letting her know about the falls.

## 2024-06-08 ENCOUNTER — Other Ambulatory Visit: Payer: Self-pay | Admitting: Family

## 2024-06-08 MED ORDER — LOSARTAN POTASSIUM 50 MG PO TABS: 50.0000 mg | ORAL_TABLET | Freq: Every day | ORAL | 1 refills | Status: DC

## 2024-06-09 ENCOUNTER — Encounter: Payer: Self-pay | Admitting: Family

## 2024-06-09 ENCOUNTER — Other Ambulatory Visit: Payer: Self-pay

## 2024-06-09 MED ORDER — LOSARTAN POTASSIUM 50 MG PO TABS: 50.0000 mg | ORAL_TABLET | Freq: Every day | ORAL | 1 refills | Status: AC

## 2024-06-13 ENCOUNTER — Observation Stay

## 2024-06-13 ENCOUNTER — Emergency Department

## 2024-06-13 ENCOUNTER — Inpatient Hospital Stay
Admission: EM | Admit: 2024-06-13 | Discharge: 2024-06-17 | DRG: 057 | Disposition: A | Discharge status: Home / Self Care | Attending: Hospitalist | Admitting: Hospitalist

## 2024-06-13 ENCOUNTER — Other Ambulatory Visit: Payer: Self-pay

## 2024-06-13 ENCOUNTER — Encounter: Payer: Self-pay | Admitting: Pulmonary Disease

## 2024-06-13 ENCOUNTER — Encounter: Payer: Self-pay | Admitting: Internal Medicine

## 2024-06-13 ENCOUNTER — Ambulatory Visit (INDEPENDENT_AMBULATORY_CARE_PROVIDER_SITE_OTHER): Admitting: Pulmonary Disease

## 2024-06-13 VITALS — BP 130/70 | HR 100 | Ht 68.0 in | Wt 139.0 lb

## 2024-06-13 DIAGNOSIS — Z1152 Encounter for screening for COVID-19: Secondary | ICD-10-CM

## 2024-06-13 DIAGNOSIS — E785 Hyperlipidemia, unspecified: Secondary | ICD-10-CM | POA: Diagnosis present

## 2024-06-13 DIAGNOSIS — R4182 Altered mental status, unspecified: Secondary | ICD-10-CM | POA: Diagnosis present

## 2024-06-13 DIAGNOSIS — I2510 Atherosclerotic heart disease of native coronary artery without angina pectoris: Secondary | ICD-10-CM | POA: Diagnosis present

## 2024-06-13 DIAGNOSIS — Z8249 Family history of ischemic heart disease and other diseases of the circulatory system: Secondary | ICD-10-CM

## 2024-06-13 DIAGNOSIS — N400 Enlarged prostate without lower urinary tract symptoms: Secondary | ICD-10-CM | POA: Diagnosis present

## 2024-06-13 DIAGNOSIS — Z833 Family history of diabetes mellitus: Secondary | ICD-10-CM

## 2024-06-13 DIAGNOSIS — Z8611 Personal history of tuberculosis: Secondary | ICD-10-CM

## 2024-06-13 DIAGNOSIS — F419 Anxiety disorder, unspecified: Secondary | ICD-10-CM | POA: Diagnosis present

## 2024-06-13 DIAGNOSIS — Z9981 Dependence on supplemental oxygen: Secondary | ICD-10-CM

## 2024-06-13 DIAGNOSIS — Z801 Family history of malignant neoplasm of trachea, bronchus and lung: Secondary | ICD-10-CM

## 2024-06-13 DIAGNOSIS — J441 Chronic obstructive pulmonary disease with (acute) exacerbation: Secondary | ICD-10-CM | POA: Diagnosis present

## 2024-06-13 DIAGNOSIS — R531 Weakness: Principal | ICD-10-CM

## 2024-06-13 DIAGNOSIS — Z981 Arthrodesis status: Secondary | ICD-10-CM

## 2024-06-13 DIAGNOSIS — J9612 Chronic respiratory failure with hypercapnia: Secondary | ICD-10-CM | POA: Diagnosis present

## 2024-06-13 DIAGNOSIS — R55 Syncope and collapse: Secondary | ICD-10-CM | POA: Diagnosis present

## 2024-06-13 DIAGNOSIS — Z79899 Other long term (current) drug therapy: Secondary | ICD-10-CM

## 2024-06-13 DIAGNOSIS — G4700 Insomnia, unspecified: Secondary | ICD-10-CM | POA: Diagnosis present

## 2024-06-13 DIAGNOSIS — I5032 Chronic diastolic (congestive) heart failure: Secondary | ICD-10-CM | POA: Diagnosis present

## 2024-06-13 DIAGNOSIS — D509 Iron deficiency anemia, unspecified: Secondary | ICD-10-CM | POA: Diagnosis present

## 2024-06-13 DIAGNOSIS — G9340 Encephalopathy, unspecified: Secondary | ICD-10-CM | POA: Diagnosis present

## 2024-06-13 DIAGNOSIS — R270 Ataxia, unspecified: Secondary | ICD-10-CM | POA: Diagnosis present

## 2024-06-13 DIAGNOSIS — R296 Repeated falls: Secondary | ICD-10-CM | POA: Diagnosis not present

## 2024-06-13 DIAGNOSIS — J439 Emphysema, unspecified: Secondary | ICD-10-CM | POA: Diagnosis not present

## 2024-06-13 DIAGNOSIS — F17210 Nicotine dependence, cigarettes, uncomplicated: Secondary | ICD-10-CM | POA: Diagnosis present

## 2024-06-13 DIAGNOSIS — J9611 Chronic respiratory failure with hypoxia: Secondary | ICD-10-CM

## 2024-06-13 DIAGNOSIS — Z87442 Personal history of urinary calculi: Secondary | ICD-10-CM

## 2024-06-13 DIAGNOSIS — R488 Other symbolic dysfunctions: Secondary | ICD-10-CM | POA: Diagnosis not present

## 2024-06-13 DIAGNOSIS — I110 Hypertensive heart disease with heart failure: Secondary | ICD-10-CM | POA: Diagnosis present

## 2024-06-13 DIAGNOSIS — Z9221 Personal history of antineoplastic chemotherapy: Secondary | ICD-10-CM

## 2024-06-13 DIAGNOSIS — G2581 Restless legs syndrome: Principal | ICD-10-CM | POA: Diagnosis present

## 2024-06-13 DIAGNOSIS — M4802 Spinal stenosis, cervical region: Secondary | ICD-10-CM | POA: Diagnosis present

## 2024-06-13 DIAGNOSIS — Z7982 Long term (current) use of aspirin: Secondary | ICD-10-CM

## 2024-06-13 DIAGNOSIS — Z923 Personal history of irradiation: Secondary | ICD-10-CM

## 2024-06-13 DIAGNOSIS — Z85118 Personal history of other malignant neoplasm of bronchus and lung: Secondary | ICD-10-CM

## 2024-06-13 LAB — CBC
HCT: 29.1 % — ABNORMAL LOW (ref 39.0–52.0)
Hemoglobin: 8.8 g/dL — ABNORMAL LOW (ref 13.0–17.0)
MCH: 23.3 pg — ABNORMAL LOW (ref 26.0–34.0)
MCHC: 30.2 g/dL (ref 30.0–36.0)
MCV: 77.2 fL — ABNORMAL LOW (ref 80.0–100.0)
Platelets: 320 K/uL (ref 150–400)
RBC: 3.77 MIL/uL — ABNORMAL LOW (ref 4.22–5.81)
RDW: 16 % — ABNORMAL HIGH (ref 11.5–15.5)
WBC: 7.6 K/uL (ref 4.0–10.5)
nRBC: 0 % (ref 0.0–0.2)

## 2024-06-13 LAB — URINALYSIS, ROUTINE W REFLEX MICROSCOPIC
Bilirubin Urine: NEGATIVE
Glucose, UA: NEGATIVE mg/dL
Hgb urine dipstick: NEGATIVE
Ketones, ur: NEGATIVE mg/dL
Leukocytes,Ua: NEGATIVE
Nitrite: NEGATIVE
Protein, ur: NEGATIVE mg/dL
Specific Gravity, Urine: 1.01 (ref 1.005–1.030)
pH: 7 (ref 5.0–8.0)

## 2024-06-13 LAB — COMPREHENSIVE METABOLIC PANEL WITH GFR
ALT: 26 U/L (ref 0–44)
AST: 47 U/L — ABNORMAL HIGH (ref 15–41)
Albumin: 3.6 g/dL (ref 3.5–5.0)
Alkaline Phosphatase: 105 U/L (ref 38–126)
Anion gap: 11 (ref 5–15)
BUN: 18 mg/dL (ref 8–23)
CO2: 36 mmol/L — ABNORMAL HIGH (ref 22–32)
Calcium: 8.8 mg/dL — ABNORMAL LOW (ref 8.9–10.3)
Chloride: 88 mmol/L — ABNORMAL LOW (ref 98–111)
Creatinine, Ser: 0.75 mg/dL (ref 0.61–1.24)
GFR, Estimated: >60 mL/min (ref >60)
Glucose, Bld: 103 mg/dL — ABNORMAL HIGH (ref 70–99)
Potassium: 4.2 mmol/L (ref 3.5–5.1)
Sodium: 135 mmol/L (ref 135–145)
Total Bilirubin: 0.9 mg/dL (ref 0.0–1.2)
Total Protein: 6.7 g/dL (ref 6.5–8.1)

## 2024-06-13 LAB — CK: Total CK: 848 U/L — ABNORMAL HIGH (ref 49–397)

## 2024-06-13 LAB — BLOOD GAS, VENOUS
Acid-Base Excess: 14.5 mmol/L — ABNORMAL HIGH (ref 0.0–2.0)
Bicarbonate: 43.2 mmol/L — ABNORMAL HIGH (ref 20.0–28.0)
O2 Saturation: 98.2 %
Patient temperature: 37
pCO2, Ven: 73 mmHg (ref 44–60)
pH, Ven: 7.38 (ref 7.25–7.43)
pO2, Ven: 84 mmHg — ABNORMAL HIGH (ref 32–45)

## 2024-06-13 LAB — PROCALCITONIN: Procalcitonin: <0.1 ng/mL

## 2024-06-13 LAB — BRAIN NATRIURETIC PEPTIDE: B Natriuretic Peptide: 52 pg/mL (ref 0.0–100.0)

## 2024-06-13 LAB — IRON AND TIBC
Iron: 30 ug/dL — ABNORMAL LOW (ref 45–182)
Saturation Ratios: 8 % — ABNORMAL LOW (ref 17.9–39.5)
TIBC: 371 ug/dL (ref 250–450)
UIBC: 341 ug/dL

## 2024-06-13 LAB — RESP PANEL BY RT-PCR (RSV, FLU A&B, COVID)  RVPGX2
Influenza A by PCR: NEGATIVE
Influenza B by PCR: NEGATIVE
Resp Syncytial Virus by PCR: NEGATIVE
SARS Coronavirus 2 by RT PCR: NEGATIVE

## 2024-06-13 LAB — RETICULOCYTES
Immature Retic Fract: 26.1 % — ABNORMAL HIGH (ref 2.3–15.9)
RBC.: 3.77 MIL/uL — ABNORMAL LOW (ref 4.22–5.81)
Retic Count, Absolute: 82.9 K/uL (ref 19.0–186.0)
Retic Ct Pct: 2.2 % (ref 0.4–3.1)

## 2024-06-13 LAB — TSH: TSH: 0.351 u[IU]/mL (ref 0.350–4.500)

## 2024-06-13 LAB — FERRITIN: Ferritin: 48 ng/mL (ref 24–336)

## 2024-06-13 MED ORDER — NICOTINE 21 MG/24HR TD PT24
21.0000 mg | MEDICATED_PATCH | TRANSDERMAL | Status: DC
  Administered 2024-06-13 – 2024-06-16 (×4): 21 mg via TRANSDERMAL
  Filled 2024-06-13 (×4): qty 1

## 2024-06-13 MED ORDER — LABETALOL HCL 5 MG/ML IV SOLN: 10.0000 mg | INTRAVENOUS | Status: DC | PRN

## 2024-06-13 MED ORDER — IPRATROPIUM-ALBUTEROL 0.5-2.5 (3) MG/3ML IN SOLN
3.0000 mL | Freq: Four times a day (QID) | RESPIRATORY_TRACT | Status: DC
  Administered 2024-06-13 – 2024-06-14 (×2): 3 mL via RESPIRATORY_TRACT
  Filled 2024-06-13 (×2): qty 3

## 2024-06-13 MED ORDER — MAGNESIUM SULFATE IN D5W 1-5 GM/100ML-% IV SOLN
1.0000 g | Freq: Once | INTRAVENOUS | Status: AC
  Administered 2024-06-13: 1 g via INTRAVENOUS
  Filled 2024-06-13: qty 100

## 2024-06-13 MED ORDER — IPRATROPIUM-ALBUTEROL 0.5-2.5 (3) MG/3ML IN SOLN
3.0000 mL | Freq: Four times a day (QID) | RESPIRATORY_TRACT | Status: DC
Start: 2024-06-13 — End: 2024-06-13

## 2024-06-13 MED ORDER — FUROSEMIDE 10 MG/ML IJ SOLN: 40.0000 mg | Freq: Once | INTRAMUSCULAR | Status: DC

## 2024-06-13 MED ORDER — FINASTERIDE 5 MG PO TABS
5.0000 mg | ORAL_TABLET | Freq: Every day | ORAL | Status: DC
  Administered 2024-06-13 – 2024-06-17 (×4): 5 mg via ORAL
  Filled 2024-06-13 (×6): qty 1

## 2024-06-13 MED ORDER — ONDANSETRON HCL 4 MG PO TABS: 4.0000 mg | ORAL_TABLET | Freq: Four times a day (QID) | ORAL | Status: DC | PRN

## 2024-06-13 MED ORDER — ENOXAPARIN SODIUM 40 MG/0.4ML IJ SOSY
40.0000 mg | PREFILLED_SYRINGE | INTRAMUSCULAR | Status: DC
  Administered 2024-06-13 – 2024-06-16 (×4): 40 mg via SUBCUTANEOUS
  Filled 2024-06-13 (×4): qty 0.4

## 2024-06-13 MED ORDER — STERILE WATER FOR INJECTION IJ SOLN
INTRAMUSCULAR | Status: AC
  Administered 2024-06-13: 10 mL
  Filled 2024-06-13: qty 10

## 2024-06-13 MED ORDER — ALBUTEROL SULFATE (2.5 MG/3ML) 0.083% IN NEBU: 3.0000 mL | INHALATION_SOLUTION | RESPIRATORY_TRACT | Status: DC | PRN

## 2024-06-13 MED ORDER — LOSARTAN POTASSIUM 50 MG PO TABS
50.0000 mg | ORAL_TABLET | Freq: Every day | ORAL | Status: DC
  Administered 2024-06-13 – 2024-06-17 (×4): 50 mg via ORAL
  Filled 2024-06-13 (×5): qty 1

## 2024-06-13 MED ORDER — ATORVASTATIN CALCIUM 20 MG PO TABS
40.0000 mg | ORAL_TABLET | Freq: Every day | ORAL | Status: DC
Start: 2024-06-13 — End: 2024-06-14
  Administered 2024-06-13: 40 mg via ORAL
  Filled 2024-06-13 (×2): qty 2

## 2024-06-13 MED ORDER — AZELASTINE HCL 0.1 % NA SOLN
1.0000 | Freq: Two times a day (BID) | NASAL | Status: DC
  Administered 2024-06-13 – 2024-06-17 (×6): 1 via NASAL
  Filled 2024-06-13: qty 30

## 2024-06-13 MED ORDER — TAMSULOSIN HCL 0.4 MG PO CAPS
0.4000 mg | ORAL_CAPSULE | Freq: Every day | ORAL | Status: DC
  Administered 2024-06-13 – 2024-06-16 (×3): 0.4 mg via ORAL
  Filled 2024-06-13 (×5): qty 1

## 2024-06-13 MED ORDER — LORAZEPAM 2 MG/ML IJ SOLN
0.5000 mg | Freq: Four times a day (QID) | INTRAMUSCULAR | Status: DC | PRN
Start: 2024-06-13 — End: 2024-06-14
  Administered 2024-06-13: 0.5 mg via INTRAVENOUS
  Filled 2024-06-13: qty 1

## 2024-06-13 MED ORDER — ROPINIROLE HCL 0.25 MG PO TABS
0.5000 mg | ORAL_TABLET | Freq: Three times a day (TID) | ORAL | Status: DC
  Administered 2024-06-13 – 2024-06-17 (×8): 0.5 mg via ORAL
  Filled 2024-06-13 (×12): qty 2

## 2024-06-13 MED ORDER — SENNOSIDES-DOCUSATE SODIUM 8.6-50 MG PO TABS: 1.0000 | ORAL_TABLET | Freq: Every evening | ORAL | Status: DC | PRN

## 2024-06-13 MED ORDER — FERROUS SULFATE 325 (65 FE) MG PO TABS
325.0000 mg | ORAL_TABLET | Freq: Every day | ORAL | Status: DC
  Administered 2024-06-15 – 2024-06-17 (×3): 325 mg via ORAL
  Filled 2024-06-13 (×4): qty 1

## 2024-06-13 MED ORDER — BUPROPION HCL ER (SR) 150 MG PO TB12
150.0000 mg | ORAL_TABLET | Freq: Two times a day (BID) | ORAL | Status: DC
Start: 2024-06-13 — End: 2024-06-15
  Administered 2024-06-13 – 2024-06-15 (×2): 150 mg via ORAL
  Filled 2024-06-13 (×5): qty 1

## 2024-06-13 MED ORDER — PANTOPRAZOLE SODIUM 40 MG PO TBEC
40.0000 mg | DELAYED_RELEASE_TABLET | Freq: Two times a day (BID) | ORAL | Status: DC
  Administered 2024-06-13 – 2024-06-17 (×6): 40 mg via ORAL
  Filled 2024-06-13 (×8): qty 1

## 2024-06-13 MED ORDER — ONDANSETRON HCL 4 MG/2ML IJ SOLN: 4.0000 mg | Freq: Four times a day (QID) | INTRAMUSCULAR | Status: DC | PRN

## 2024-06-13 MED ORDER — ACETAMINOPHEN 500 MG PO TABS
1000.0000 mg | ORAL_TABLET | Freq: Four times a day (QID) | ORAL | Status: DC | PRN
  Administered 2024-06-13 – 2024-06-17 (×2): 1000 mg via ORAL
  Filled 2024-06-13 (×2): qty 2

## 2024-06-13 MED ORDER — FUROSEMIDE 10 MG/ML IJ SOLN
40.0000 mg | Freq: Once | INTRAMUSCULAR | Status: AC
  Administered 2024-06-13: 40 mg via INTRAVENOUS
  Filled 2024-06-13: qty 4

## 2024-06-13 MED ORDER — FLUTICASONE PROPIONATE 50 MCG/ACT NA SUSP: 2.0000 | Freq: Every day | NASAL | Status: DC | PRN

## 2024-06-13 MED ORDER — CYCLOBENZAPRINE HCL 10 MG PO TABS
5.0000 mg | ORAL_TABLET | Freq: Three times a day (TID) | ORAL | Status: DC | PRN
Start: 2024-06-13 — End: 2024-06-17
  Administered 2024-06-13: 5 mg via ORAL
  Filled 2024-06-13: qty 1

## 2024-06-13 MED ORDER — FUROSEMIDE 40 MG PO TABS: 40.0000 mg | ORAL_TABLET | Freq: Every day | ORAL | Status: DC

## 2024-06-13 MED ORDER — ACETAZOLAMIDE SODIUM 500 MG IJ SOLR
500.0000 mg | Freq: Once | INTRAMUSCULAR | Status: AC
  Administered 2024-06-13: 500 mg via INTRAVENOUS
  Filled 2024-06-13: qty 500

## 2024-06-13 MED ORDER — ASPIRIN 81 MG PO TBEC
81.0000 mg | DELAYED_RELEASE_TABLET | Freq: Every day | ORAL | Status: DC
  Administered 2024-06-13 – 2024-06-17 (×4): 81 mg via ORAL
  Filled 2024-06-13 (×5): qty 1

## 2024-06-13 MED ORDER — PREDNISONE 20 MG PO TABS
40.0000 mg | ORAL_TABLET | Freq: Every day | ORAL | Status: DC
  Filled 2024-06-13: qty 2

## 2024-06-13 MED ORDER — IRON SUCROSE 500 MG IVPB - SIMPLE MED
500.0000 mg | Freq: Once | INTRAVENOUS | Status: AC
Start: 2024-06-13 — End: 2024-06-14
  Administered 2024-06-14: 500 mg via INTRAVENOUS
  Filled 2024-06-13: qty 500

## 2024-06-13 MED ORDER — METHYLPREDNISOLONE SODIUM SUCC 125 MG IJ SOLR
125.0000 mg | Freq: Once | INTRAMUSCULAR | Status: AC
  Administered 2024-06-13: 125 mg via INTRAVENOUS
  Filled 2024-06-13: qty 2

## 2024-06-13 MED ORDER — FUROSEMIDE 40 MG PO TABS
40.0000 mg | ORAL_TABLET | Freq: Every day | ORAL | Status: DC
Start: 2024-06-14 — End: 2024-06-14
  Filled 2024-06-13: qty 1

## 2024-06-13 NOTE — Patient Instructions (Signed)
 VISIT SUMMARY:  Today, we discussed your severe COPD and chronic respiratory failure, as well as your recent issues with frequent falls and leg weakness. We addressed the problem with your oxygen  tubing and considered the possibility of a neurologic event contributing to your falls.  YOUR PLAN:  -SEVERE CHRONIC OBSTRUCTIVE PULMONARY DISEASE WITH CHRONIC RESPIRATORY FAILURE: Chronic obstructive pulmonary disease (COPD) is a long-term lung condition that makes it hard to breathe, and chronic respiratory failure means your lungs are not getting enough oxygen  into your blood. We found that your oxygen  tubing was kinked, which could have affected your oxygen  delivery. To prevent this, use clips to keep the tubing straight.  -RECURRENT FALLS AND GENERALIZED WEAKNESS, POSSIBLE ACUTE NEUROLOGIC EVENT: Your frequent falls and leg weakness could be due to a neurologic event, such as a stroke. We recommend that you go to the emergency room for an evaluation, which should include imaging like a head scan to check for any neurologic issues.  INSTRUCTIONS:  Please go to the emergency room as soon as possible for an evaluation of your falls and leg weakness. The emergency room team should perform imaging, such as a head scan, to rule out a stroke or other neurologic problems.

## 2024-06-13 NOTE — H&P (Addendum)
 History and Physical    Colton Nguyen [name]:[id number] DOB: 1946/07/20 DOA: 06/13/2024  PCP: [name], FNP (Confirm with patient/family/NH records and if not entered, this has to be entered at [facility] point of entry) Patient coming from: Home  I have personally briefly reviewed patient's old medical records in [facility]  Chief Complaint: Frequent falls  HPI: Colton Nguyen is a 78 y.o. 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  Patient was recently hospitalized 2 months ago for COPD and pneumonia and discharged to nursing home.  After extensive stay at nursing home patient went home.  At baseline patient uses roller walker to ambulate however after the discharge from nursing home patient sister noticed patient has had trouble to keep balance, and symptoms gradually getting worse and in the last 3 days patient has had multiple falls.  Sister attributed the frequent falls at least partly to  uncontrolled restless leg syndrome as patient was staying twisting turning and patient reported that his right leg appears to be more stiff than the left side, hard to control when starting to walk.  But he denied any head or neck injury or LOC.  Moreover, he denied any weakness of any 1 side of the limbs.  No decrease sensation either.  In addition sister also noticed the patient has intermittent  muscle twitching randomly in the arms and legs and patient reported muscle cramps despite taking Flexeril .  Today, patient went back to his pulmonology for follow-up of COPD, and pulmonology was concerned about patient had a stroke at some point and sent  patient to ED.  Family also reported that Lasix  dosage increased from 20 mg daily to 40 mg daily since Saturday to address worsening of peripheral edema. ED Course:  Afebrile, blood pressure 150/90.  Saturation 100% on 4 L.  Chest x-ray negative for acute infiltrates, blood work showed bicarb 36 BUN 18 creatinine 0.7 WBC 7.6 hemoglobin 8.8 compared to baseline 9.5-10.4 decreased MCV, VBG showed 7.30 8/73/84. Review of Systems: As per HPI otherwise 14 point review of systems negative.    Past Medical History:  Diagnosis Date   Acute respiratory failure with hypoxia (HCC) 01/28/2023   Anxiety    situational   Arthritis    BPH (benign prostatic hyperplasia)    CAD (coronary artery disease)    CHF (congestive heart failure) (HCC)    pt denies 11/13/2019   Chronic cardiopulmonary disease (HCC)    COPD (chronic obstructive pulmonary disease) (HCC)    History of kidney stones    Hyperlipidemia    Hypertension    Hypertension 12/28/2016   lung ca dx'd 03/2015   Mass of lung    Nicotine  dependence    Personal history of kidney stones    Restless leg    Shortness of breath dyspnea    with exertion   Tuberculosis    Wears glasses     Past Surgical History:  Procedure Laterality Date   BACK SURGERY     BIOPSY  01/25/2020   Procedure: BIOPSY;  Surgeon: Legrand Victory LITTIE DOUGLAS, MD;  Location: [facility];  Service: Gastroenterology;;   CAD CCTA 01/31/15     CERVICAL FUSION  COLONOSCOPY     COLONOSCOPY WITH PROPOFOL  N/A 01/25/2020   Procedure: COLONOSCOPY WITH PROPOFOL ;  Surgeon: Legrand Victory LITTIE DOUGLAS, MD;  Location: [facility];  Service: Gastroenterology;  Laterality: N/A;   ESOPHAGOGASTRODUODENOSCOPY (EGD) WITH PROPOFOL  N/A 01/25/2020   Procedure: ESOPHAGOGASTRODUODENOSCOPY (EGD) WITH PROPOFOL ;  Surgeon: Legrand Victory LITTIE DOUGLAS, MD;  Location: [facility];  Service: Gastroenterology;  Laterality: N/A;   FRACTURE SURGERY     left ankle   HEMOSTASIS CLIP PLACEMENT  01/25/2020   Procedure:  HEMOSTASIS CLIP PLACEMENT;  Surgeon: Legrand Victory LITTIE DOUGLAS, MD;  Location: [facility];  Service: Gastroenterology;;   HOT HEMOSTASIS N/A 01/25/2020   Procedure: HOT HEMOSTASIS (ARGON PLASMA COAGULATION/BICAP);  Surgeon: Legrand Victory LITTIE DOUGLAS, MD;  Location: [facility];  Service: Gastroenterology;  Laterality: N/A;   LUNG BIOPSY N/A 02/27/2015   Procedure: LUNG BIOPSY;  Surgeon: Dallas KATHEE Jude, MD;  Location: [facility] OR;  Service: Thoracic;  Laterality: N/A;   right inguinal hernia repair at age 59     VIDEO BRONCHOSCOPY WITH ENDOBRONCHIAL ULTRASOUND N/A 02/27/2015   Procedure: VIDEO BRONCHOSCOPY WITH ENDOBRONCHIAL ULTRASOUND;  Surgeon: Dallas KATHEE Jude, MD;  Location: [facility] OR;  Service: Thoracic;  Laterality: N/A;     reports that he has been smoking cigarettes. He started smoking about 56 years ago. He has a 140.8 pack-year smoking history. He has been exposed to tobacco smoke. He has never used smokeless tobacco. He reports current alcohol use. He reports that he does not use drugs.  No Known Allergies  Family History  Problem Relation Age of Onset   Cancer Other    Heart disease Father    Lung cancer Father    Hypertension Other    Diabetes Mother    Valvular heart disease Mother    Diabetes Sister    Colon polyps Maternal Uncle    Diabetes Sister    Diabetes Sister    Diabetes Niece    Colon cancer Cousin    Esophageal cancer Neg Hx    Stomach cancer Neg Hx    Pancreatic cancer Neg Hx      Prior to Admission medications   Medication Sig Start Date End Date Taking? Authorizing Provider  acetaminophen  (TYLENOL ) 500 MG tablet Take 1,000 mg by mouth every 6 (six) hours as needed for moderate pain or headache.    [provider]  albuterol  (VENTOLIN  HFA) 108 (90 Base) MCG/ACT inhaler Inhale 1-2 puffs into the lungs every 4 (four) hours as needed for wheezing or shortness of breath. 04/21/24   Lenon Marien LITTIE, MD  aspirin  81 MG tablet Take 81 mg by mouth daily.    [provider]  atorvastatin  (LIPITOR) 40 MG tablet Take 1 tablet (40 mg total) by mouth daily. 12/14/23   [name], FNP  azelastine  (ASTELIN ) 0.1 % nasal spray Place 1 spray into both nostrils 2 (two) times daily. 12/14/23   [name], FNP  buPROPion  (WELLBUTRIN  SR) 150 MG 12 hr tablet Take 1 tablet (150 mg total) by mouth 2 (two) times daily. 04/14/24 04/14/25  Scoggins, Amber, NP  Cholecalciferol  (VITAMIN D3) 5000 UNITS TABS Take 5,000 Units by mouth daily.    [provider]  cyclobenzaprine  (FLEXERIL ) 5 MG tablet TAKE ONE TABLET (5 MG TOTAL) BY MOUTH THREE TIMES DAILY AS NEEDED FOR MUSCLE SPASMS. 11/30/23   [name], FNP  ferrous sulfate  325 (65 FE) MG tablet Take 1 tablet (325 mg total) by mouth daily with breakfast. 12/14/23   Orlean,  Alan HERO, FNP  finasteride  (PROSCAR ) 5 MG tablet Take 1 tablet (5 mg total) by mouth daily. 12/14/23   [name], FNP  fluticasone  (FLONASE ) 50 MCG/ACT nasal spray Place 2 sprays into both nostrils daily as needed for allergies.  04/02/15   [provider]  furosemide  (LASIX ) 20 MG tablet Take 2 tablets (40 mg total) by mouth daily. 06/06/24   [name], FNP  ipratropium-albuterol  (DUONEB) 0.5-2.5 (3) MG/3ML SOLN Take 3 mLs by nebulization 4 (four) times daily. 05/22/24   Tamea Dedra CROME, MD  losartan  (COZAAR ) 50 MG tablet Take 1 tablet (50 mg total) by mouth daily. 06/09/24   [name], FNP  methylPREDNISolone  (MEDROL  DOSEPAK) 4 MG TBPK tablet Use as directed in the package.  This is a taper pack. 05/22/24   Tamea Dedra CROME, MD  nicotine  (NICODERM CQ  - DOSED IN MG/24 HOURS) 21 mg/24hr patch Place 1 patch (21 mg total) onto the skin daily. 04/14/24 04/14/25  Scoggins, Amber, NP  pantoprazole  (PROTONIX ) 40 MG tablet Take 1 tablet (40 mg total) by mouth 2 (two) times daily before a meal. 05/22/24   Tamea Dedra CROME, MD  rOPINIRole  (REQUIP ) 0.25 MG tablet TAKE ONE TABLET (0.25 MG TOTAL) BY MOUTH THREE (THREE)  TIMES DAILY. 05/12/24   [name], FNP  tamsulosin  (FLOMAX ) 0.4 MG CAPS capsule TAKE ONE CAPSULE (0.4 MG TOTAL) BY MOUTH DAILY. (NEEDS APPT FOR FUTURE FILL) 05/05/24   Carin Gauze, NP    Physical Exam: Vitals:   06/13/24 1218 06/13/24 1220 06/13/24 1221 06/13/24 1435  BP:  (!) 154/73  (!) 150/89  Pulse: (!) 103   100  Resp: 20   20  Temp: 98.6 F (37 C)     TempSrc: Oral     SpO2:  (!) 86% 90% 100%    Constitutional: NAD, calm, comfortable Vitals:   06/13/24 1218 06/13/24 1220 06/13/24 1221 06/13/24 1435  BP:  (!) 154/73  (!) 150/89  Pulse: (!) 103   100  Resp: 20   20  Temp: 98.6 F (37 C)     TempSrc: Oral     SpO2:  (!) 86% 90% 100%   Eyes: PERRL, lids and conjunctivae normal ENMT: Mucous membranes are moist. Posterior pharynx clear of any exudate or lesions.Normal dentition.  Neck: normal, supple, no masses, no thyromegaly Respiratory: clear to auscultation bilaterally, no wheezing, no crackles. Normal respiratory effort. No accessory muscle use.  Cardiovascular: Regular rate and rhythm, no murmurs / rubs / gallops.  1+ extremity edema left more than right. 2+ pedal pulses. No carotid bruits.  Abdomen: no tenderness, no masses palpated. No hepatosplenomegaly. Bowel sounds positive.  Musculoskeletal: no clubbing / cyanosis. No joint deformity upper and lower extremities. Good ROM, no contractures. Normal muscle tone.  Skin: no rashes, lesions, ulcers. No induration Neurologic: CN 2-12 grossly intact. Sensation intact, DTR normal. Strength 5/5 in all 4.  Somewhat deficient of heel to knee test more traumatic of right leg, finger to nose ok Psychiatric: Normal judgment and insight. Alert and oriented x 3. Normal mood.    Labs on Admission: I have personally reviewed following labs and imaging studies  CBC: Recent Labs  Lab 06/13/24 1257  WBC 7.6  HGB 8.8*  HCT 29.1*  MCV 77.2*  PLT 320   Basic Metabolic Panel: Recent Labs  Lab 06/13/24 1257  NA 135  K  4.2  CL 88*  CO2 36*  GLUCOSE 103*  BUN 18  CREATININE 0.75  CALCIUM  8.8*   GFR: Estimated Creatinine Clearance: 69 mL/min (by C-G formula based on SCr of 0.75 mg/dL). Liver Function Tests: Recent Labs  Lab 06/13/24 1257  AST 47*  ALT 26  ALKPHOS 105  BILITOT 0.9  PROT 6.7  ALBUMIN  3.6   No results for input(s): LIPASE, AMYLASE in the last 168 hours. No results for input(s): AMMONIA in the last 168 hours. Coagulation Profile: No results for input(s): INR, PROTIME in the last 168 hours. Cardiac Enzymes: No results for input(s): CKTOTAL, CKMB, CKMBINDEX, TROPONINI in the last 168 hours. BNP (last 3 results) No results for input(s): PROBNP in the last 8760 hours. HbA1C: No results for input(s): HGBA1C in the last 72 hours. CBG: No results for input(s): GLUCAP in the last 168 hours. Lipid Profile: No results for input(s): CHOL, HDL, LDLCALC, TRIG, CHOLHDL, LDLDIRECT in the last 72 hours. Thyroid Function Tests: No results for input(s): TSH, T4TOTAL, FREET4, T3FREE, THYROIDAB in the last 72 hours. Anemia Panel: No results for input(s): VITAMINB12, FOLATE, FERRITIN, TIBC, IRON, RETICCTPCT in the last 72 hours. Urine analysis:    Component Value Date/Time   COLORURINE STRAW (A) 04/01/2024 1136   APPEARANCEUR CLEAR (A) 04/01/2024 1136   LABSPEC 1.006 04/01/2024 1136   PHURINE 8.0 04/01/2024 1136   GLUCOSEU NEGATIVE 04/01/2024 1136   HGBUR NEGATIVE 04/01/2024 1136   BILIRUBINUR NEGATIVE 04/01/2024 1136   KETONESUR NEGATIVE 04/01/2024 1136   PROTEINUR NEGATIVE 04/01/2024 1136   NITRITE NEGATIVE 04/01/2024 1136   LEUKOCYTESUR NEGATIVE 04/01/2024 1136    Radiological Exams on Admission: CT Head Wo Contrast Result Date: 06/13/2024 CLINICAL DATA:  falls, general weakness. EXAM: CT HEAD WITHOUT CONTRAST TECHNIQUE: Contiguous axial images were obtained from the base of the skull through the vertex without intravenous  contrast. RADIATION DOSE REDUCTION: This exam was performed according to the departmental dose-optimization program which includes automated exposure control, adjustment of the mA and/or kV according to patient size and/or use of iterative reconstruction technique. COMPARISON:  CT scan head from 05/03/2024. FINDINGS: Brain: No evidence of acute infarction, hemorrhage, hydrocephalus, extra-axial collection or mass lesion/mass effect. There is bilateral periventricular hypodensity, which is non-specific but most likely seen in the settings of microvascular ischemic changes. Mild in extent. Otherwise normal appearance of brain parenchyma. Ventricles are normal. Cerebral volume is age appropriate. Vascular: No hyperdense vessel or unexpected calcification. Intracranial arteriosclerosis. Skull: Normal. Negative for fracture or focal lesion. Sinuses/Orbits: No acute finding. Other: Visualized mastoid air cells are unremarkable. No mastoid effusion. IMPRESSION: No acute intracranial abnormality. Electronically Signed   By: Ree Molt M.D.   On: 06/13/2024 14:41   DG Chest 1 View Result Date: 06/13/2024 CLINICAL DATA:  sob, weakness. EXAM: CHEST  1 VIEW COMPARISON:  04/16/2024. FINDINGS: Redemonstration of biapical pleural-parenchymal disease as well as associated bronchiectasis/scarring without significant interval change. However, there are asymmetrically increased interstitial markings over the upper half of the left lung, which appears slightly more pronounced than the prior exam. Findings are nonspecific and can be due to underlying asymmetric pulmonary edema on the background of emphysema or chronic interstitial lung disease. Correlate clinically. Bilateral lung fields are otherwise clear. No dense consolidation or lung collapse. Bilateral costophrenic angles are clear. Stable cardio-mediastinal silhouette. No acute osseous abnormalities. Subacute healing fracture of posterior left ninth rib noted. The soft  tissues are within normal limits. IMPRESSION: 1. Asymmetrically increased interstitial markings over the upper half of the left lung, which appears slightly more pronounced than the prior exam. Findings are nonspecific and can  be due to underlying asymmetric pulmonary edema on the background of emphysema or chronic interstitial lung disease. 2. Otherwise no acute cardiopulmonary abnormality. Electronically Signed   By: Ree Molt M.D.   On: 06/13/2024 14:38    EKG: Independently reviewed.  Sinus rhythm, no acute ST changes.  Assessment/Plan Principal Problem:   Frequent falls Active Problems:   Ataxia  (please populate well all problems here in Problem List. (For example, if patient is on BP meds at home and you resume or decide to hold them, it is a problem that needs to be her. Same for CAD, COPD, HLD and so on)  Ataxia Acute on chronic ambulatory impairment - Rule out stroke, MRI of the brain ordered - Other DDx, appears that patient had cervical spine and lumbar spine surgeries before, recent cervical spine CT showed no signs of central canal stenosis, but given that the acute onset of ataxia, will also order cervical spine MRI to rule out cervical spinal myelopathy.  Consider lumbar spine imaging if stroke and cervical spine etiology ruled out.  Went through patient's medication list, low suspicion for polypharmacy. - PT OT evaluation  Muscle cramping - Increase Flexeril  dosage - Given the random muscle twitching symptoms, we will check one-time EEG to rule out seizure.  Worsening of restless leg syndrome - Continue ropinirole  - Other DDx, will check iron level as patient peripheral blood showed microcytic anemia despite taking iron supplement.  Chronic hypoxic and hypercapnic respiratory failure Compensated chronic respiratory acidosis -No acute concern - Continue LABA and ICS - As needed DuoNebs  Anxiety/depression - Continue Wellbutrin   BPH - Continue Proscar  and  Flomax   HLD - Continue statin - Check CK level  HTN Chronic HFpEF -Mild fluid overload, plan to continue current 40 mg p.o. Lasix  - DVT study  Total time spent on patient care 75 minutes DVT prophylaxis: Lovenox  Code Status: Full code Family Communication: Sister at bedside Disposition Plan: Expect less than 2 midnight hospital stay Consults called: None Admission status: Telemetry observation   Cort ONEIDA Mana MD [facility] Pager [phone]  06/13/2024, 3:42 PM

## 2024-06-13 NOTE — ED Provider Notes (Signed)
 [facility] Provider Note    Event Date/Time   First MD Initiated Contact with Patient 06/13/24 1322     (approximate)   History   Loss of Consciousness   HPI  Colton Nguyen is a 78 y.o. male  COPD with chronic hypoxic respite failure on oxygen  via nasal cannula as needed for shortness of breath, cigarette smoking, remote history of squamous cell lung cancer in 2016, HTN, chronic HFpEF, HLD, BPH, CAD who presents to the emergency department with 1 week of progressively worsening right-sided weakness in his right upper extremity, frequent falls, increasing confusion and hallucinations.  History is largely obtained by the patient's sister who is at bedside.  Patient was recently discharged from a skilled nursing facility in the middle of September.  He was initially doing well but approximately 1 week ago began having worsening weakness and falls on his right side.  He he is not on any blood thinners.  Although the chief complaint was syncope patient and his sister deny this at this time.  Patient was seen by his pulmonologist today and she reported that everything sounded well with his pulmonary exam however she was concerned about missed CVA recommended that he come to the emergency department      Physical Exam   Triage Vital Signs: ED Triage Vitals  Encounter Vitals Group     BP 06/13/24 1220 (!) 154/73     Girls Systolic BP Percentile --      Girls Diastolic BP Percentile --      Boys Systolic BP Percentile --      Boys Diastolic BP Percentile --      Pulse Rate 06/13/24 1218 (!) 103     Resp 06/13/24 1218 20     Temp 06/13/24 1218 98.6 F (37 C)     Temp Source 06/13/24 1218 Oral     SpO2 06/13/24 1220 (!) 86 %     Weight --      Height --      Head Circumference --      Peak Flow --      Pain Score --      Pain Loc --      Pain Education --      Exclude from Growth Chart --     Most recent vital signs: Vitals:   06/13/24 1221  06/13/24 1435  BP:  (!) 150/89  Pulse:  100  Resp:  20  Temp:    SpO2: 90% 100%    Nursing Triage Note reviewed. Vital signs reviewed and patients oxygen  saturation is normoxic on home O2  General: thin well developed, awake and alert, appears chronically ill.  He is a two-person assist into the bed Eyes: Normal inspection, extraocular muscles intact, no conjunctival pallor Ear, nose, throat: Normal external exam Neck: Normal range of motion Respiratory: Patient is in no respiratory distress, lungs with mild wheezes Cardiovascular: Patient is not tachycardic, RR GI: Abd SNT with no guarding or rebound  Back: Normal inspection of the back with good strength and range of motion throughout all ext Extremities: pulses intact with good cap refills, no LE pitting edema or calf tenderness Neuro: The patient is alert and oriented to person, place, not to time, easily confused, patient's right hand seems to be held in a contracture and strength is 4/5 in that extremity and patient seems to be leaning excessively to the side, no weakness in other extremities, no obvious facial droop, no aphasia Skin:  Warm, dry, and intact Psych: normal mood and affect, no SI or HI  ED Results / Procedures / Treatments   Labs (all labs ordered are listed, but only abnormal results are displayed) Labs Reviewed  COMPREHENSIVE METABOLIC PANEL WITH GFR - Abnormal; Notable for the following components:      Result Value   Chloride 88 (*)    CO2 36 (*)    Glucose, Bld 103 (*)    Calcium  8.8 (*)    AST 47 (*)    All other components within normal limits  CBC - Abnormal; Notable for the following components:   RBC 3.77 (*)    Hemoglobin 8.8 (*)    HCT 29.1 (*)    MCV 77.2 (*)    MCH 23.3 (*)    RDW 16.0 (*)    All other components within normal limits  URINALYSIS, ROUTINE W REFLEX MICROSCOPIC - Abnormal; Notable for the following components:   Color, Urine YELLOW (*)    APPearance CLEAR (*)    All other  components within normal limits  BLOOD GAS, VENOUS - Abnormal; Notable for the following components:   pCO2, Ven 73 (*)    pO2, Ven 84 (*)    Bicarbonate 43.2 (*)    Acid-Base Excess 14.5 (*)    All other components within normal limits  RETICULOCYTES - Abnormal; Notable for the following components:   RBC. 3.77 (*)    Immature Retic Fract 26.1 (*)    All other components within normal limits  RESP PANEL BY RT-PCR (RSV, FLU A&B, COVID)  RVPGX2  BRAIN NATRIURETIC PEPTIDE  PROCALCITONIN  TSH  IRON AND TIBC  FERRITIN  CK  CBG MONITORING, ED     EKG EKG and rhythm strip are interpreted by myself:   EKG: Tachycardic sinus rhythm] at heart rate of 100, normal QRS duration, QTc 443, nonspecific ST segments and T waves no ectopy EKG not consistent with Acute STEMI Rhythm strip: Tachycardic sinus rhythm in lead II   RADIOLOGY CT head no contrast: No acute abnormality on my independent review and interpretation radiologist agrees Chest x-ray: No acute abnormality and consistent with emphysema and radiologist agrees    PROCEDURES:  Critical Care performed: No  Procedures   MEDICATIONS ORDERED IN ED: Medications  LORazepam  (ATIVAN ) injection 0.5 mg (has no administration in time range)  acetaminophen  (TYLENOL ) tablet 1,000 mg (has no administration in time range)  aspirin  tablet 81 mg (has no administration in time range)  atorvastatin  (LIPITOR) tablet 40 mg (has no administration in time range)  furosemide  (LASIX ) tablet 40 mg (has no administration in time range)  losartan  (COZAAR ) tablet 50 mg (has no administration in time range)  buPROPion  (WELLBUTRIN  SR) 12 hr tablet 150 mg (has no administration in time range)  nicotine  (NICODERM CQ  - dosed in mg/24 hours) patch 21 mg (has no administration in time range)  pantoprazole  (PROTONIX ) EC tablet 40 mg (has no administration in time range)  finasteride  (PROSCAR ) tablet 5 mg (has no administration in time range)  tamsulosin   (FLOMAX ) capsule 0.4 mg (has no administration in time range)  ferrous sulfate  tablet 325 mg (has no administration in time range)  cyclobenzaprine  (FLEXERIL ) tablet 5 mg (has no administration in time range)  rOPINIRole  (REQUIP ) tablet 0.5 mg (has no administration in time range)  albuterol  (VENTOLIN  HFA) 108 (90 Base) MCG/ACT inhaler 1-2 puff (has no administration in time range)  azelastine  (ASTELIN ) 0.1 % nasal spray 1 spray (has no administration in time range)  fluticasone  (FLONASE ) 50 MCG/ACT nasal spray 2 spray (has no administration in time range)  ipratropium-albuterol  (DUONEB) 0.5-2.5 (3) MG/3ML nebulizer solution 3 mL (has no administration in time range)  enoxaparin  (LOVENOX ) injection 40 mg (has no administration in time range)  ondansetron  (ZOFRAN ) tablet 4 mg (has no administration in time range)    Or  ondansetron  (ZOFRAN ) injection 4 mg (has no administration in time range)  senna-docusate (Senokot-S) tablet 1 tablet (has no administration in time range)     IMPRESSION / MDM / ASSESSMENT AND PLAN / ED COURSE                                Differential diagnosis includes, but is not limited to, missed CVA, UTI, intracranial hemorrhage, pneumonia, electrolyte derangement  ED course: Patient presents with 1 week of right-sided weakness and frequent falls.  The initial triage complaint was syncope however this is not accurate according to patient and patient's sister at bedside.  Patient does seem to have new right-sided weakness but is certainly out of the window for TNK or any acute stroke alert.  CT head demonstrated no intracranial hemorrhage.  Urinalysis was not consistent with UTI.  He did have an elevated CO2 however he was not acidotic.  He had no leukocytosis.  Case discussed with hospitalist for workup for encephalopathy possible CVA   Clinical Course as of 06/13/24 1610  Tue Jun 13, 2024  1500 pCO2, Ven(!!): 73 Elevated but he is not acidotic [HD]  1510 Case  discussed with hospitalist for admission [HD]    Clinical Course User Index [HD] Nicholaus Rolland BRAVO, MD   -- Risk: 5 This patient has a high risk of morbidity due to further diagnostic testing or treatment. Rationale: This patient's evaluation and management involve a high risk of morbidity due to the potential severity of presenting symptoms, need for diagnostic testing, and/or initiation of treatment that may require close monitoring. The differential includes conditions with potential for significant deterioration or requiring escalation of care. Treatment decisions in the ED, including medication administration, procedural interventions, or disposition planning, reflect this level of risk. COPA: 5 The patient has the following acute or chronic illness/injury that poses a possible threat to life or bodily function: [X] : The patient has a potentially serious acute condition or an acute exacerbation of a chronic illness requiring urgent evaluation and management in the Emergency Department. The clinical presentation necessitates immediate consideration of life-threatening or function-threatening diagnoses, even if they are ultimately ruled out.   FINAL CLINICAL IMPRESSION(S) / ED DIAGNOSES   Final diagnoses:  Right sided weakness  Frequent falls  Acute encephalopathy     Rx / DC Orders   ED Discharge Orders     None        Note:  This document was prepared using Dragon voice recognition software and may include unintentional dictation errors.   Nicholaus Rolland BRAVO, MD 06/13/24 [phone]

## 2024-06-13 NOTE — Progress Notes (Signed)
 Subjective:    Patient ID: Colton Nguyen, male    DOB: 09/27/1945, 78 y.o.   MRN: [id number]  Patient Care Team: [name], FNP as PCP - General (Family Medicine) Fernand Denyse LABOR, MD as Consulting Physician (Cardiology) Army Dallas NOVAK, MD (Inactive) as Consulting Physician (Cardiothoracic Surgery)  Chief Complaint  Patient presents with   COPD    BACKGROUND/INTERVAL:Patient is a 78 year old current heavy cigarette smoker who presents for follow-up of shortness of breath on exertion and at rest.  He has known advanced COPD and chronic respiratory failure with hypoxia.  HPI Discussed the use of AI scribe software for clinical note transcription with the patient, who gave verbal consent to proceed.  History of Present Illness   Colton Nguyen is a 78 year old male with severe COPD and chronic respiratory failure who presents for follow-up.  He presents with his sister [name].  He is experiencing issues with his oxygen  tubing, which was found to have a kink. He uses his own tubing, and a clip was suggested to prevent future kinks. He has been using a nebulizer for his respiratory condition.  He reports a lack of strength in his legs and has been experiencing frequent falls since Thursday, with incidents occurring daily and sometimes twice a day. The fire department has been called multiple times to assist him in getting up after falls. He fell twice yesterday, and assistance was needed again this morning.  He appears also to have some issues with urinary incontinence.  His medication regimen includes an increased dose of a fluid pill (Lasix ) to 40 mg.  His sister is concerned about potential interactions or side effects from his medications, he has significant polypharmacy.   Patient also has had episodes of confusion, he appears very debilitated.  During examination he cannot stay still in the chair.      Review of Systems A 10 point review of systems was  performed and it is as noted above otherwise negative.   Patient Active Problem List   Diagnosis Date Noted   Aspiration pneumonia (HCC) 04/16/2024   PNA (pneumonia) 04/16/2024   Restless leg syndrome 04/14/2024   Depression 04/14/2024   Protein-calorie malnutrition, severe 04/01/2024   COPD exacerbation (HCC) 03/30/2024   Prediabetes 01/30/2023   Chronic respiratory failure with hypoxia, on home O2 therapy (HCC) 01/28/2023   Iron deficiency anemia, unspecified 12/21/2019   Drug induced constipation 12/21/2019   Anxiety 12/21/2019   Tobacco use disorder 12/21/2019   Cognitive communication deficit 12/21/2019   Lumbar stenosis with neurogenic claudication 11/14/2019   BPH (benign prostatic hyperplasia) 08/01/2018   Hypertension 12/28/2016   COPD (chronic obstructive pulmonary disease) (HCC) 12/28/2016   Encounter for nail care 06/03/2015   Smoking trying to quit 04/01/2015   Squamous cell carcinoma of lung, stage III 03/07/2015   spiculated right upper lobe mass 02/14/2015    Social History   Tobacco Use   Smoking status: Every Day    Current packs/day: 2.50    Average packs/day: 2.5 packs/day for 56.3 years (140.8 ttl pk-yrs)    Types: Cigarettes    Start date: 02/14/1968    Passive exposure: Current   Smokeless tobacco: Never  Substance Use Topics   Alcohol use: Yes    Alcohol/week: 0.0 standard drinks of alcohol    Comment: ocassional    No Known Allergies  Current Meds  Medication Sig   acetaminophen  (TYLENOL ) 500 MG tablet Take 1,000 mg by mouth every 6 (six) hours  as needed for moderate pain or headache.   albuterol  (VENTOLIN  HFA) 108 (90 Base) MCG/ACT inhaler Inhale 1-2 puffs into the lungs every 4 (four) hours as needed for wheezing or shortness of breath.   aspirin  81 MG tablet Take 81 mg by mouth daily.   atorvastatin  (LIPITOR) 40 MG tablet Take 1 tablet (40 mg total) by mouth daily.   azelastine  (ASTELIN ) 0.1 % nasal spray Place 1 spray into both nostrils 2  (two) times daily.   buPROPion  (WELLBUTRIN  SR) 150 MG 12 hr tablet Take 1 tablet (150 mg total) by mouth 2 (two) times daily.   Cholecalciferol  (VITAMIN D3) 5000 UNITS TABS Take 5,000 Units by mouth daily.   cyclobenzaprine  (FLEXERIL ) 5 MG tablet TAKE ONE TABLET (5 MG TOTAL) BY MOUTH THREE TIMES DAILY AS NEEDED FOR MUSCLE SPASMS.   ferrous sulfate  325 (65 FE) MG tablet Take 1 tablet (325 mg total) by mouth daily with breakfast.   finasteride  (PROSCAR ) 5 MG tablet Take 1 tablet (5 mg total) by mouth daily.   fluticasone  (FLONASE ) 50 MCG/ACT nasal spray Place 2 sprays into both nostrils daily as needed for allergies.    furosemide  (LASIX ) 20 MG tablet Take 2 tablets (40 mg total) by mouth daily.   ipratropium-albuterol  (DUONEB) 0.5-2.5 (3) MG/3ML SOLN Take 3 mLs by nebulization 4 (four) times daily.   losartan  (COZAAR ) 50 MG tablet Take 1 tablet (50 mg total) by mouth daily.   methylPREDNISolone  (MEDROL  DOSEPAK) 4 MG TBPK tablet Use as directed in the package.  This is a taper pack.   nicotine  (NICODERM CQ  - DOSED IN MG/24 HOURS) 21 mg/24hr patch Place 1 patch (21 mg total) onto the skin daily.   pantoprazole  (PROTONIX ) 40 MG tablet Take 1 tablet (40 mg total) by mouth 2 (two) times daily before a meal.   rOPINIRole  (REQUIP ) 0.25 MG tablet TAKE ONE TABLET (0.25 MG TOTAL) BY MOUTH THREE (THREE) TIMES DAILY.   tamsulosin  (FLOMAX ) 0.4 MG CAPS capsule TAKE ONE CAPSULE (0.4 MG TOTAL) BY MOUTH DAILY. (NEEDS APPT FOR FUTURE FILL)    Immunization History  Administered Date(s) Administered   Fluad Trivalent(High Dose 65+) 08/09/2023   Influenza-Unspecified 08/05/2015   PFIZER Comirnaty(Gray Top)Covid-19 Tri-Sucrose Vaccine 01/10/2021   Zoster Recombinant(Shingrix) 06/30/2017, 09/22/2017        Objective:     BP 130/70   Pulse 100   Ht 5' 8 (1.727 m)   Wt 139 lb (63 kg)   SpO2 98%   BMI 21.13 kg/m   SpO2: 98 % on 2 L/min nasal cannula O2  GENERAL: Thin, well-developed gentleman, moderate  use of accessories for respiration.  Appears uncomfortable, writhing in the chair without specific complaint.  Appears somewhat confused.  On POC with nasal cannula O2 in place.  Mild conversational dyspnea. HEAD: Normocephalic, atraumatic.  EYES: Pupils equal, round, reactive to light.  No scleral icterus.  MOUTH: Poor dentition, oral mucosa moist.  No thrush. NECK: Supple. No thyromegaly. Trachea midline. No JVD.  No adenopathy. PULMONARY: Distant breath sounds.  Coarse, no wheezes or rhonchi noted.  Positive Hoover's sign. CARDIOVASCULAR: S1 and S2. Regular rate and rhythm.  No rubs, murmurs or gallops heard. ABDOMEN: Benign. MUSCULOSKELETAL: No joint deformity, no clubbing, no edema.  NEUROLOGIC: No overt focal deficit, no gait disturbance, speech is fluent. SKIN: Intact,warm,dry. PSYCH: Appears somewhat confused.  Has echolalia.      Assessment & Plan:     ICD-10-CM   1. Falls frequently  R29.6     2.  Altered mental status, unspecified altered mental status type  R41.82     3. Chronic obstructive pulmonary disease with emphysema, unspecified emphysema type (HCC)  J43.9     4. Chronic respiratory failure with hypoxia, on home O2 therapy (HCC)  J96.11    Z99.81      Discussion:    Severe chronic obstructive pulmonary disease with chronic respiratory failure No wheezing on examination. Oxygen  tubing was kinked, potentially affecting oxygen  delivery. - Educate on preventing kinks in oxygen  tubing by using clips to maintain straightness.  Recurrent falls and generalized weakness, possible acute neurologic event (rule out stroke) Recurrent falls and generalized weakness since Thursday, with multiple falls per day. Concerns for possible acute neurologic event such as stroke. Lungs are clear, and oxygen  levels are stable, suggesting the issue may not be pulmonary in origin. - Recommend evaluation in the emergency room for falls and possible neurologic event. - Advise emergency  room evaluation to include imaging, such as a head scan, to assess for stroke or other neurologic issues.   Altered mental status Sister verifies that patient has been somewhat off.  During examination he appeared to not be able to sit still and exhibited echolalia.  Query stroke versus metabolic derangements. - Recommend emergency room evaluation.   Advised if symptoms do not improve or worsen, to please contact office for sooner follow up or seek emergency care.    I spent 35 minutes of dedicated to the care of this patient on the date of this encounter to include pre-visit review of records, face-to-face time with the patient discussing conditions above, post visit ordering of testing, clinical documentation with the electronic health record, making appropriate referrals as documented, and communicating necessary findings to members of the patients care team.     C. Leita Sanders, MD Advanced Bronchoscopy [facility]    *This note was generated using voice recognition software/Dragon and/or AI transcription program.  Despite best efforts to proofread, errors can occur which can change the meaning. Any transcriptional errors that result from this process are unintentional and may not be fully corrected at the time of dictation.

## 2024-06-13 NOTE — ED Triage Notes (Signed)
 Patient states he has had multiple syncopal episodes and falls over the past couple of weeks.

## 2024-06-13 NOTE — Progress Notes (Signed)
 Patient cam for MRI scan after receiving meds for claustrophobia; pt started crawling out of scanner unable to hold still

## 2024-06-13 NOTE — Progress Notes (Signed)
 Pt got to the floor and looked to be in distress, using accessory muscles, crackles inspiratory and expiratory, pursed lip breathing, and tremors (said to be baseline per pt). O2 saturation was 100% and BP 176/100.   MD notified and aware.

## 2024-06-13 NOTE — Progress Notes (Addendum)
 Iron sat 7% despite taking iron supplement, will give one dose of IV iron.  Nurse reported that patient started to have respiratory distress. I went to see the patient, diffused wheezing with scattered crackles. Likely COPD exacerbation. Ordered Solumedro, Mg and Duoneb. One dose of Lasix . Stat Xray. D/W floor nurse.

## 2024-06-13 NOTE — ED Notes (Signed)
 Pt provided peri care and clean sheets and brief at this time.

## 2024-06-14 ENCOUNTER — Observation Stay

## 2024-06-14 DIAGNOSIS — R296 Repeated falls: Secondary | ICD-10-CM | POA: Diagnosis present

## 2024-06-14 DIAGNOSIS — R799 Abnormal finding of blood chemistry, unspecified: Secondary | ICD-10-CM | POA: Diagnosis not present

## 2024-06-14 DIAGNOSIS — M4802 Spinal stenosis, cervical region: Secondary | ICD-10-CM | POA: Diagnosis present

## 2024-06-14 DIAGNOSIS — R55 Syncope and collapse: Secondary | ICD-10-CM | POA: Diagnosis present

## 2024-06-14 DIAGNOSIS — Z833 Family history of diabetes mellitus: Secondary | ICD-10-CM | POA: Diagnosis not present

## 2024-06-14 DIAGNOSIS — I2510 Atherosclerotic heart disease of native coronary artery without angina pectoris: Secondary | ICD-10-CM | POA: Diagnosis present

## 2024-06-14 DIAGNOSIS — G4700 Insomnia, unspecified: Secondary | ICD-10-CM | POA: Diagnosis present

## 2024-06-14 DIAGNOSIS — G2581 Restless legs syndrome: Secondary | ICD-10-CM | POA: Diagnosis present

## 2024-06-14 DIAGNOSIS — F419 Anxiety disorder, unspecified: Secondary | ICD-10-CM | POA: Diagnosis present

## 2024-06-14 DIAGNOSIS — J9612 Chronic respiratory failure with hypercapnia: Secondary | ICD-10-CM | POA: Diagnosis present

## 2024-06-14 DIAGNOSIS — J441 Chronic obstructive pulmonary disease with (acute) exacerbation: Secondary | ICD-10-CM | POA: Diagnosis present

## 2024-06-14 DIAGNOSIS — I110 Hypertensive heart disease with heart failure: Secondary | ICD-10-CM | POA: Diagnosis present

## 2024-06-14 DIAGNOSIS — Z1152 Encounter for screening for COVID-19: Secondary | ICD-10-CM | POA: Diagnosis not present

## 2024-06-14 DIAGNOSIS — R569 Unspecified convulsions: Secondary | ICD-10-CM

## 2024-06-14 DIAGNOSIS — R270 Ataxia, unspecified: Secondary | ICD-10-CM | POA: Diagnosis present

## 2024-06-14 DIAGNOSIS — Z9981 Dependence on supplemental oxygen: Secondary | ICD-10-CM | POA: Diagnosis not present

## 2024-06-14 DIAGNOSIS — G9340 Encephalopathy, unspecified: Secondary | ICD-10-CM | POA: Diagnosis present

## 2024-06-14 DIAGNOSIS — J9611 Chronic respiratory failure with hypoxia: Secondary | ICD-10-CM | POA: Diagnosis present

## 2024-06-14 DIAGNOSIS — I5032 Chronic diastolic (congestive) heart failure: Secondary | ICD-10-CM | POA: Diagnosis present

## 2024-06-14 DIAGNOSIS — Z8249 Family history of ischemic heart disease and other diseases of the circulatory system: Secondary | ICD-10-CM | POA: Diagnosis not present

## 2024-06-14 DIAGNOSIS — F17210 Nicotine dependence, cigarettes, uncomplicated: Secondary | ICD-10-CM | POA: Diagnosis present

## 2024-06-14 DIAGNOSIS — D509 Iron deficiency anemia, unspecified: Secondary | ICD-10-CM | POA: Diagnosis present

## 2024-06-14 DIAGNOSIS — R4182 Altered mental status, unspecified: Secondary | ICD-10-CM | POA: Diagnosis present

## 2024-06-14 DIAGNOSIS — N400 Enlarged prostate without lower urinary tract symptoms: Secondary | ICD-10-CM | POA: Diagnosis present

## 2024-06-14 DIAGNOSIS — R278 Other lack of coordination: Secondary | ICD-10-CM | POA: Diagnosis not present

## 2024-06-14 DIAGNOSIS — Z79899 Other long term (current) drug therapy: Secondary | ICD-10-CM | POA: Diagnosis not present

## 2024-06-14 DIAGNOSIS — E785 Hyperlipidemia, unspecified: Secondary | ICD-10-CM | POA: Diagnosis present

## 2024-06-14 DIAGNOSIS — Z7982 Long term (current) use of aspirin: Secondary | ICD-10-CM | POA: Diagnosis not present

## 2024-06-14 LAB — CBC
HCT: 28.9 % — ABNORMAL LOW (ref 39.0–52.0)
Hemoglobin: 8.6 g/dL — ABNORMAL LOW (ref 13.0–17.0)
MCH: 23.2 pg — ABNORMAL LOW (ref 26.0–34.0)
MCHC: 29.8 g/dL — ABNORMAL LOW (ref 30.0–36.0)
MCV: 78.1 fL — ABNORMAL LOW (ref 80.0–100.0)
Platelets: 316 K/uL (ref 150–400)
RBC: 3.7 MIL/uL — ABNORMAL LOW (ref 4.22–5.81)
RDW: 15.9 % — ABNORMAL HIGH (ref 11.5–15.5)
WBC: 7.5 K/uL (ref 4.0–10.5)
nRBC: 0 % (ref 0.0–0.2)

## 2024-06-14 LAB — BASIC METABOLIC PANEL WITH GFR
Anion gap: 11 (ref 5–15)
BUN: 19 mg/dL (ref 8–23)
CO2: 36 mmol/L — ABNORMAL HIGH (ref 22–32)
Calcium: 8.8 mg/dL — ABNORMAL LOW (ref 8.9–10.3)
Chloride: 89 mmol/L — ABNORMAL LOW (ref 98–111)
Creatinine, Ser: 1.06 mg/dL (ref 0.61–1.24)
GFR, Estimated: >60 mL/min (ref >60)
Glucose, Bld: 112 mg/dL — ABNORMAL HIGH (ref 70–99)
Potassium: 3.8 mmol/L (ref 3.5–5.1)
Sodium: 136 mmol/L (ref 135–145)

## 2024-06-14 LAB — CK: Total CK: 866 U/L — ABNORMAL HIGH (ref 49–397)

## 2024-06-14 MED ORDER — SODIUM CHLORIDE 0.9 % IV SOLN: INTRAVENOUS | Status: AC

## 2024-06-14 MED ORDER — LORAZEPAM 2 MG/ML IJ SOLN
1.0000 mg | Freq: Four times a day (QID) | INTRAMUSCULAR | Status: AC | PRN
  Administered 2024-06-14: 1 mg via INTRAVENOUS
  Filled 2024-06-14: qty 1

## 2024-06-14 MED ORDER — IPRATROPIUM-ALBUTEROL 0.5-2.5 (3) MG/3ML IN SOLN
3.0000 mL | Freq: Three times a day (TID) | RESPIRATORY_TRACT | Status: DC
  Administered 2024-06-14 – 2024-06-17 (×9): 3 mL via RESPIRATORY_TRACT
  Filled 2024-06-14 (×8): qty 3

## 2024-06-14 NOTE — Procedures (Signed)
 History: 78 year old male being evaluated for muscle twitching, evaluating for seizures  EEG Duration: 20 minutes and 49 seconds  Sedation: None  Patient State: Awake and asleep  Technique: This EEG was acquired with electrodes placed according to the International 10-20 electrode system (including Fp1, Fp2, F3, F4, C3, C4, P3, P4, O1, O2, T3, T4, T5, T6, A1, A2, Fz, Cz, Pz). The following electrodes were missing or displaced: none.   Background: The background consists of intermixed alpha and beta activities. There is a well defined posterior dominant rhythm of 8-9 Hz that attenuates with eye opening. Sleep is recorded with normal appearing structures.   Photic stimulation: Physiologic driving is not performed  EEG Abnormalities: None  Clinical Interpretation: This normal EEG is recorded in the waking and sleep state. There was no seizure or seizure predisposition recorded on this study. Please note that lack of epileptiform activity on EEG does not preclude the possibility of epilepsy.   Aisha Seals, MD [facility]   If 7pm- 7am, please page neurology on call as listed in AMION.

## 2024-06-14 NOTE — Plan of Care (Signed)

## 2024-06-14 NOTE — Progress Notes (Signed)
 PROGRESS NOTE    Colton Nguyen  FMW:[id number] DOB: [birth year] DOA: 06/13/2024 PCP: Colton Alan HERO, FNP  104A/104A-AA  LOS: 0 days   Brief hospital course:   Assessment & Plan: Colton Nguyen is a 78 y.o. 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   Patient was recently hospitalized 2 months ago for COPD and pneumonia and discharged to nursing home.  After extensive stay at nursing home patient went home.  At baseline patient uses roller walker to ambulate however after the discharge from nursing home patient sister noticed patient has had trouble to keep balance, and symptoms gradually getting worse and in the last 3 days patient has had multiple falls.  Sister attributed the frequent falls at least partly to  uncontrolled restless leg syndrome as patient was staying twisting turning and patient reported that his right leg appears to be more stiff than the left side, hard to control when starting to walk.  But he denied any head or neck injury or LOC.  Moreover, he denied any weakness of any 1 side of the limbs.  No decrease sensation either.  In addition sister also noticed the patient has intermittent  muscle twitching randomly in the arms and legs and patient reported muscle cramps despite taking Flexeril .  Today, patient went back to his pulmonology for follow-up of COPD, and pulmonology was concerned about patient had a stroke at some point and sent patient to ED.   Ataxia Acute on chronic ambulatory impairment --MRI brain and cervical spine without acute finding --PT/OT   Muscle cramping --EEG ordered on admission,  wnl.  Rhabdo --cont MIVF   Worsening of restless leg syndrome --cont Requip    Chronic hypoxic and hypercapnic respiratory failure on 2L O2 PRN Compensated chronic respiratory acidosis --Continue supplemental O2 to keep sats >=90%  Anxiety/depression - Continue Wellbutrin    BPH - Continue Proscar  and Flomax    HLD --hold statin  HTN Chronic HFpEF --d/c lasix  since on MIVF   DVT prophylaxis: Lovenox  SQ Code Status: Full code  Family Communication:  Level of care: Telemetry Medical Dispo:   The patient is from: home Anticipated d/c is to: SNF rehab Anticipated d/c date is: 1-2 days   Subjective and Interval History:  Pt was somnolent after receiving ativan  for MRI.  Family and RN reported legs jerking while sleeping.   Objective: Vitals:   06/14/24 1458 06/14/24 1557 06/14/24 1951 06/14/24 2008  BP: (!) 130/93 136/70  (!) 150/82  Pulse: 93 88  92  Resp: 18 16  20   Temp: 98.3 F (36.8 C) 98.3 F (36.8 C)  97.6 F (36.4 C)  TempSrc: Oral   Oral  SpO2: 93% 98% 98% 100%  Weight:      Height:        Intake/Output Summary (Last 24 hours) at 06/14/2024 2053 Last data filed at 06/14/2024 1900 Gross per 24 hour  Intake 507.48 ml  Output 200 ml  Net 307.48 ml   Filed Weights   06/13/24 2045  Weight: 63.5 kg    Examination:   Constitutional: NAD HEENT: conjunctivae and lids normal, EOMI CV: No cyanosis.   RESP: normal respiratory effort, [place] away from nostrils.  No  wheezes. Extremities: No effusions, edema in BLE SKIN: warm, dry   Data Reviewed: I have personally reviewed labs and imaging studies  Time spent: 50 minutes  Colton Haber, MD [facility] If 7PM-7AM, please contact night-coverage 06/14/2024, 8:53 PM

## 2024-06-14 NOTE — Plan of Care (Signed)
  Problem: Clinical Measurements: Goal: Ability to maintain clinical measurements within normal limits will improve Outcome: Progressing   Problem: Pain Managment: Goal: General experience of comfort will improve and/or be controlled Outcome: Progressing   Problem: Safety: Goal: Ability to remain free from injury will improve Outcome: Progressing   Problem: Skin Integrity: Goal: Risk for impaired skin integrity will decrease Outcome: Progressing

## 2024-06-14 NOTE — Evaluation (Signed)
 Occupational Therapy Evaluation Patient Details Name: Colton Nguyen MRN: [id number] DOB: [birth year] Today's Date: 06/14/2024   History of Present Illness   Pt is a 78 y.o. 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     Clinical Impressions Pt was seen for OT evaluation this date. Per RN, pt had MRI this AM as was heavily sedated for procedure.  Pt currently demonstrates decreased level of alertness, however is slowly improving.  Due to current mentation, pt is unable to provide PLOF at the time of evaluation.  Pt currently requires significant assistance/total assistance with ADLs, allows for PROM to BUEs however does flair arms occasionally.  Pt does respond to noxious stimulation (cold cloth of forehead) and attempts to remove however required Max to total assistance to wipe face with warm wash cloth.  Evaluation was attempted x 2 on today and pt does demonstrate slight improvement to mentation and does open eyes occasionally and when asked if he is in pain, does verbalize No.  Pt's level of alertness appears to be improving and pt would benefit from skilled OT services to address noted impairments and functional limitations (see below for any additional details) in order to maximize safety and independence while minimizing future risk of falls, injury, and readmission. Anticipate the need for follow up OT services upon acute hospital DC.      If plan is discharge home, recommend the following:   Two people to help with walking and/or transfers;Two people to help with bathing/dressing/bathroom     Functional Status Assessment   Patient has had a  recent decline in their functional status and demonstrates the ability to make significant improvements in function in a reasonable and predictable amount of time.     Equipment Recommendations   Other (comment) (Defer to next venue of care)     Recommendations for Other Services         Precautions/Restrictions   Precautions Precautions: Fall Recall of Precautions/Restrictions: Impaired Restrictions Weight Bearing Restrictions Per Provider Order: No     Mobility Bed Mobility Overal bed mobility: Needs Assistance Bed Mobility: Rolling Rolling: Max assist, +2 for physical assistance         General bed mobility comments: Pt did open eyes and reach out for bed rail during repositioning however was unable to reposition self or complete bed mobility    Transfers Overall transfer level: Needs assistance                 General transfer comment: at the time of evaluation, pt is currently total assistance due to decreased level of alertness.      Balance Overall balance assessment: Needs assistance     Sitting balance - Comments: Due to decreased level of alertness, did not attempt sitting.  Will attempt in subsequent sessions as mentation/level of alertness improves.       Standing balance comment: Due to decreased level of alertness, did not attempt sitting or standing. Will attempt in subsequent sessions as mentation/level of alertness improves.                           ADL either performed or assessed with clinical  judgement   ADL Overall ADL's : Needs assistance/impaired   Eating/Feeding Details (indicate cue type and reason): decreased level of alertness, did not attempt breakfast Grooming: Wash/dry face;Total assistance;Bed level;Maximal assistance Grooming Details (indicate cue type and reason): Cold wash cloth used as stimulation to increase level of alertness.  Pt opened eyes briefly and attempted to remove.  Cold wash cloth replaced  with warm wash cloth with forward chaining to engage in wiping face with pt attempting to reach for face.  Wash cloth removed. Upper Body Bathing: Total assistance;Bed level       Upper Body Dressing : Total assistance;Bed level Upper Body Dressing Details (indicate cue type and reason): decreased level of alertness with pt unable to attempt this task Lower Body Dressing: Total assistance;Bed level Lower Body Dressing Details (indicate cue type and reason): decreased level of alertness with pt unable to attempt this task Toilet Transfer: Total assistance Toilet Transfer Details (indicate cue type and reason): decreased level of alertness with pt unable to attempt toilet transfer           General ADL Comments: decreased level of alertness with pt unable to attempt most ADL task engagement     Vision   Additional Comments: Unable to assess due to decreased alertness     Perception         Praxis         Pertinent Vitals/Pain Pain Assessment Pain Assessment: No/denies pain (Patient asked if he was hurting or in pain and he reported no)     Extremity/Trunk Assessment Upper Extremity Assessment Upper Extremity Assessment: Difficult to assess due to impaired cognition   Lower Extremity Assessment Lower Extremity Assessment: Defer to PT evaluation       Communication Communication Communication: Impaired (decreased level of alertness) Factors Affecting Communication:  (mumbles with speech attempts)   Cognition Arousal: Suspect due to medications, Stuporous Behavior During Therapy: Restless (decreased alertness, however very restless/agitated) Cognition: Difficult to assess Difficult to assess due to: Level of arousal           OT - Cognition Comments: pt with decreased level of alertness (is improving) however unable to consistently engage during evaluation (level of alertness is very slowly improving).                 Following commands: Impaired        Cueing  General Comments   Cueing Techniques: Other (comments);Verbal cues;Tactile cues (pt with decreased level of alertness (is improving) however unable to consistently engage during evaluation (level of alertness is very slowly improving).)      Exercises Other Exercises Other Exercises: PROM to BUEs to increase level of alertness.  Due to decreased level of alertness, did not attempt sitting. Will attempt in subsequent sessions as mentation/level of alertness improves.   Shoulder Instructions      Home Living Family/patient expects to be discharged to:: Private residence Living Arrangements: Children Available Help at Discharge: Other (Comment) (pt with decreased level of alertness (is improving) however unable to provide PLOF. Information taken from most recent Acute stay.) Type of Home: House Home Access: Stairs to enter                     Home Equipment: Agricultural consultant (2 wheels);Cane - single point;Shower seat          Prior Functioning/Environment                 ADLs Comments: Unknown - pt with decreased  level of alertness (is improving) however unable to provide PLOF. Information taken from most recent Acute stay.    OT Problem List: Decreased strength;Decreased activity tolerance;Decreased safety awareness;Impaired balance (sitting and/or standing);Decreased knowledge of use of DME or AE;Decreased cognition   OT Treatment/Interventions: Self-care/ADL training;Therapeutic activities;Balance training      OT Goals(Current goals can be found in the care plan section)   Acute Rehab OT Goals Patient Stated Goal: Due to decreased level of alertness, pt unable to state goals during evaluation OT Goal Formulation: With patient Time For Goal Achievement: 06/28/24 Potential to Achieve Goals: Fair ADL Goals Pt Will Perform Grooming: sitting;with min assist Pt Will Perform Upper Body Dressing: with min assist;sitting Pt Will Transfer to Toilet:  with min assist;bedside commode   OT Frequency:  Min 2X/week    Co-evaluation              AM-PAC OT 6 Clicks Daily Activity     Outcome Measure Help from another person eating meals?: Total Help from another person taking care of personal grooming?: A Lot Help from another person toileting, which includes using toliet, bedpan, or urinal?: Total Help from another person bathing (including washing, rinsing, drying)?: Total Help from another person to put on and taking off regular upper body clothing?: Total Help from another person to put on and taking off regular lower body clothing?: Total 6 Click Score: 7   End of Session Nurse Communication: Mobility status  Activity Tolerance: Patient limited by lethargy;Treatment limited secondary to agitation Patient left: in bed;with call bell/phone within reach;with bed alarm set  OT Visit Diagnosis: Muscle weakness (generalized) (M62.81)                Time: 8954-8897 OT Time Calculation (min): 17 min Charges:  OT General Charges $OT Visit: 1 Visit OT Evaluation $OT Eval Low Complexity: 1 Low OT Treatments $Self Care/Home Management : 8-22 mins  Harlene Sharps OTR/L   Harlene LITTIE Sharps 06/14/2024, 11:45 AM

## 2024-06-14 NOTE — Progress Notes (Signed)
 Eeg done

## 2024-06-14 NOTE — Progress Notes (Signed)
 Brief Assessment Note  Medical record reviewed and patient has no TOC needs at this time. Therapy recs are pending. Please outreach to [facility] if needs are identified.

## 2024-06-14 NOTE — Evaluation (Signed)
 Physical Therapy Evaluation Patient Details Name: Colton Nguyen MRN: [id number] DOB: 13-Jan-1946 Today's Date: 06/14/2024  History of Present Illness  Pt is a 78 y.o. 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  Clinical Impression  Patient seen for initial PT evaluation due to decline in functional status. Patient is A&O x 3. Baseline mobility reported as modI sister contacted and was able to confirm PLOF; pt. currently requiring maxA x 2 progressing to minA x2 throughout treatment for transfers and standing/gait. Gait assessed with RW minA x2, limited by weakness and level of alertness. Pt only able to take few steps to chair with heavy vc for foot placement and movement sequence. Vitals stable. Pt lives with son who has cognitive impairments at baseline and limited ability to help patient. Pt has 3 steps to enter home with ability to use hand rails on each side. Clinical impression: patient presents with moderate mobility limitations secondary to ongoing medical complications. Recommend skilled PT to address safety, mobility, and discharge planning. PT recommend SNF based on assessment.        If plan is discharge home, recommend the following: Two people to help with walking and/or transfers;Two people to help with bathing/dressing/bathroom;Help with stairs or ramp for entrance;Assist for transportation;Assistance with feeding   Can travel by private vehicle   No    Equipment Recommendations    Recommendations for Other Services       Functional Status Assessment Patient has had a recent decline in their functional status and demonstrates the  ability to make significant improvements in function in a reasonable and predictable amount of time.     Precautions / Restrictions Precautions Precautions: Fall Recall of Precautions/Restrictions: Impaired Restrictions Weight Bearing Restrictions Per Provider Order: No      Mobility  Bed Mobility Overal bed mobility: Needs Assistance Bed Mobility: Rolling Rolling: Max assist, +2 for physical assistance              Transfers Overall transfer level: Needs assistance Equipment used: Rolling walker (2 wheels) Transfers: Sit to/from Stand Sit to Stand: Max assist, +2 physical assistance, Min assist           General transfer comment: pt progressed to minA x2 throughout treatment    Ambulation/Gait Ambulation/Gait assistance: Max assist Gait Distance (Feet): 5 Feet Assistive device: Rolling walker (2 wheels) Gait Pattern/deviations: Step-to pattern       General Gait Details: decreased gait speed  Stairs            Wheelchair Mobility     Tilt Bed    Modified Rankin (Stroke Patients Only)       Balance Overall balance assessment: Needs assistance Sitting-balance support: Bilateral upper extremity supported Sitting balance-Leahy Scale: Poor Sitting balance - Comments: MaxA x2 initially progressed to minA with RW needing bilateral UE support Postural control: Posterior lean Standing balance support: Bilateral upper extremity supported, Reliant on assistive device for balance, During functional activity Standing balance-Leahy Scale: Poor Standing balance comment: requires constant vc to stay alert and oriented to situation  Pertinent Vitals/Pain Pain Assessment Pain Assessment: No/denies pain    Home Living Family/patient expects to be discharged to:: Private residence Living Arrangements: Children Available Help at Discharge: Other (Comment) (pt with decreased level of alertness (is improving) however  unable to provide PLOF. Information taken from most recent Acute stay.) Type of Home: House Home Access: Stairs to enter Entrance Stairs-Rails: Right;Left;Can reach both Entrance Stairs-Number of Steps: 3   Home Layout: One level Home Equipment: Agricultural consultant (2 wheels);Cane - single point;Shower seat      Prior Function Prior Level of Function : Independent/Modified Independent             Mobility Comments: Pt reports using RW for amb outside of home,       Extremity/Trunk Assessment        Lower Extremity Assessment Lower Extremity Assessment: Generalized weakness    Cervical / Trunk Assessment Cervical / Trunk Assessment: Normal  Communication   Communication Communication: Impaired Factors Affecting Communication: Difficulty expressing self;Reduced clarity of speech    Cognition Arousal: Alert Behavior During Therapy: WFL for tasks assessed/performed, Impulsive   PT - Cognitive impairments: Orientation, Awareness, Attention, Initiation, Sequencing, Safety/Judgement   Orientation impairments: Person, Place, Time                   PT - Cognition Comments: requires constant simulation to stay alert and oriented to situation Following commands: Impaired Following commands impaired: Only follows one step commands consistently     Cueing Cueing Techniques: Other (comments), Verbal cues, Tactile cues     General Comments      Exercises     Assessment/Plan    PT Assessment Patient needs continued PT services  PT Problem List Decreased strength;Decreased range of motion;Decreased activity tolerance;Decreased balance;Decreased mobility       PT Treatment Interventions DME instruction;Gait training;Stair training;Functional mobility training;Therapeutic activities;Therapeutic exercise;Patient/family education;Neuromuscular re-education;Balance training    PT Goals (Current goals can be found in the Care Plan section)  Acute Rehab PT Goals PT  Goal Formulation: Patient unable to participate in goal setting Time For Goal Achievement: 06/28/24 Potential to Achieve Goals: Fair    Frequency Min 3X/week     Co-evaluation               AM-PAC PT 6 Clicks Mobility  Outcome Measure Help needed turning from your back to your side while in a flat bed without using bedrails?: A Little Help needed moving from lying on your back to sitting on the side of a flat bed without using bedrails?: A Lot Help needed moving to and from a bed to a chair (including a wheelchair)?: A Lot Help needed standing up from a chair using your arms (e.g., wheelchair or bedside chair)?: A Lot Help needed to walk in hospital room?: Total Help needed climbing 3-5 steps with a railing? : Total 6 Click Score: 11    End of Session Equipment Utilized During Treatment: Gait belt Activity Tolerance: Patient tolerated treatment well;Patient limited by lethargy;Treatment limited secondary to agitation Patient left: in chair;with chair alarm set;with nursing/sitter in room Nurse Communication: Mobility status PT Visit Diagnosis: Unsteadiness on feet (R26.81);Other abnormalities of gait and mobility (R26.89);Muscle weakness (generalized) (M62.81);Difficulty in walking, not elsewhere classified (R26.2)    Time: 8549-8487 PT Time Calculation (min) (ACUTE ONLY): 22 min   Charges:   PT Evaluation $PT Eval Low Complexity: 1 Low   PT General Charges $$ ACUTE PT VISIT: 1 Visit  Sherlean Lesches DPT, PT    Sherlean DELENA Lesches 06/14/2024, 3:41 PM

## 2024-06-14 NOTE — Care Management Obs Status (Signed)
 MEDICARE OBSERVATION STATUS NOTIFICATION   Patient Details  Name: Colton Nguyen MRN: [id number] Date of Birth: 06/28/1946   Medicare Observation Status Notification Given:  Yes    Christophr Calix W, CMA 06/14/2024, 10:58 AM

## 2024-06-14 NOTE — Progress Notes (Signed)
 PT Cancellation Note  Patient Details Name: Colton Nguyen MRN: [id number] DOB: [birth year]   Cancelled Treatment:    Reason Eval/Treat Not Completed: Other (comment);Patient at procedure or test/unavailable PT attempted x2. Earlier today the pt. was very lethargic and upon second attempt pt. In process of being transported for EEG. PT will check back in at later time.      Sherlean DELENA Lesches 06/14/2024, 12:37 PM

## 2024-06-15 ENCOUNTER — Other Ambulatory Visit: Payer: Self-pay | Admitting: Family

## 2024-06-15 ENCOUNTER — Other Ambulatory Visit: Payer: Self-pay | Admitting: Cardiology

## 2024-06-15 DIAGNOSIS — R278 Other lack of coordination: Secondary | ICD-10-CM | POA: Diagnosis not present

## 2024-06-15 DIAGNOSIS — R296 Repeated falls: Secondary | ICD-10-CM | POA: Diagnosis not present

## 2024-06-15 DIAGNOSIS — M4802 Spinal stenosis, cervical region: Secondary | ICD-10-CM | POA: Diagnosis not present

## 2024-06-15 DIAGNOSIS — R799 Abnormal finding of blood chemistry, unspecified: Secondary | ICD-10-CM

## 2024-06-15 LAB — AMMONIA: Ammonia: 22 umol/L (ref 9–35)

## 2024-06-15 LAB — BASIC METABOLIC PANEL WITH GFR
Anion gap: 6 (ref 5–15)
BUN: 18 mg/dL (ref 8–23)
CO2: 33 mmol/L — ABNORMAL HIGH (ref 22–32)
Calcium: 8.5 mg/dL — ABNORMAL LOW (ref 8.9–10.3)
Chloride: 98 mmol/L (ref 98–111)
Creatinine, Ser: 0.89 mg/dL (ref 0.61–1.24)
GFR, Estimated: >60 mL/min (ref >60)
Glucose, Bld: 74 mg/dL (ref 70–99)
Potassium: 4 mmol/L (ref 3.5–5.1)
Sodium: 137 mmol/L (ref 135–145)

## 2024-06-15 LAB — CK: Total CK: 810 U/L — ABNORMAL HIGH (ref 49–397)

## 2024-06-15 LAB — MAGNESIUM: Magnesium: 2.6 mg/dL — ABNORMAL HIGH (ref 1.7–2.4)

## 2024-06-15 LAB — GLUCOSE, CAPILLARY: Glucose-Capillary: 92 mg/dL (ref 70–99)

## 2024-06-15 NOTE — Progress Notes (Signed)
 PROGRESS NOTE    Colton Nguyen  FMW:[id number] DOB: 1946-03-28 DOA: 06/13/2024 PCP: [name], FNP  104A/104A-AA  LOS: 1 day   Brief hospital course:   Assessment & Plan: Colton Nguyen is a 78 y.o. 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   Patient was recently hospitalized 2 months ago for COPD and pneumonia and discharged to nursing home.  After extensive stay at nursing home patient went home.  At baseline patient uses roller walker to ambulate however after the discharge from nursing home patient sister noticed patient has had trouble to keep balance, and symptoms gradually getting worse and in the last 3 days patient has had multiple falls.  Sister attributed the frequent falls at least partly to  uncontrolled restless leg syndrome as patient was staying twisting turning and patient reported that his right leg appears to be more stiff than the left side, hard to control when starting to walk.  But he denied any head or neck injury or LOC.  Moreover, he denied any weakness of any 1 side of the limbs.  No decrease sensation either.  In addition sister also noticed the patient has intermittent  muscle twitching randomly in the arms and legs and patient reported muscle cramps despite taking Flexeril .  Today, patient went back to his pulmonology for follow-up of COPD, and pulmonology was concerned about patient had a stroke at some point and sent patient to ED.   Ataxia Acute on chronic ambulatory impairment --MRI brain and cervical spine without acute finding --PT/OT, SNF rehab   Jerking Possible myoclonus  --EEG ordered on  admission, wnl. --neuro consult today  Rhabdo --CK remains the same despite IVF.  Possible high CK at baseline --hold further IVF.   Worsening of restless leg syndrome --cont Requip    Chronic hypoxic and hypercapnic respiratory failure on 2L O2 PRN Compensated chronic respiratory acidosis --Continue supplemental O2 to keep sats >=90%  Anxiety/depression --d/c bupropion     BPH - Continue Proscar  and Flomax    HLD --hold statin  HTN Chronic HFpEF --cont losartan    DVT prophylaxis: Lovenox  SQ Code Status: Full code  Family Communication:  Level of care: Telemetry Medical Dispo:   The patient is from: home Anticipated d/c is to: SNF rehab Anticipated d/c date is: whenever SNF accepts   Subjective and Interval History:  Pt reported no dyspnea, no pain.  No jerking while awake.   Objective: Vitals:   06/15/24 0810 06/15/24 0822 06/15/24 1706 06/15/24 1952  BP:  (!) 170/84 104/67   Pulse:  80 76   Resp:  17 16   Temp:  97.8 F (36.6 C) 97.8 F (36.6 C)   TempSrc:  Oral Oral   SpO2: 90% 98% 98% 98%  Weight:      Height:        Intake/Output Summary (Last 24 hours) at 06/15/2024 2002 Last data filed at 06/14/2024 2333 Gross per 24 hour  Intake --  Output 351 ml  Net -351 ml   Filed Weights   06/13/24 2045  Weight: 63.5 kg    Examination:   Constitutional: NAD, AAOx3 HEENT: conjunctivae and lids normal, EOMI CV: No cyanosis.   RESP: normal  respiratory effort, on 2L Neuro: II - XII grossly intact.   Psych: Normal mood and affect.     Data Reviewed: I have personally reviewed labs and imaging studies  Time spent: 35 minutes  Ellouise Haber, MD [facility] If 7PM-7AM, please contact night-coverage 06/15/2024, 8:02 PM

## 2024-06-15 NOTE — Progress Notes (Signed)
 Occupational Therapy Treatment Patient Details Name: Colton Nguyen MRN: [id number] DOB: October 05, 1945 Today's Date: 06/15/2024   History of present illness Pt is a 78 y.o. 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   OT comments  Pt seen for OT treatment on this date. Upon arrival to room pt supine in bed with HOB elevated and significantly improved mentation, agreeable to tx. Pt requires CGA for bed mobility, Min A for functional transfers, and engaged in grooming/hygiene tasks while standing at sink.  Pt with decreased activity tolerance for OOB activities however greatly improved from previous session/evaluation.  O2 sats monitored throughout with rest breaks and verbal cuing for breathing techniques required.  O2 remained in low 90's on 2L supplemental O2 via Helper. Pt making good progress toward goals, will continue to follow POC. Discharge recommendation remains appropriate.        If plan is discharge home, recommend the following:  A little help with walking and/or transfers;A little help with bathing/dressing/bathroom;Assistance with cooking/housework;Help with stairs or ramp for entrance;Assist for transportation   Equipment Recommendations  Other (comment) (defer to next venue of care)    Recommendations for Other Services      Precautions / Restrictions Precautions Precautions: Fall Recall of Precautions/Restrictions: Impaired Restrictions Weight Bearing Restrictions Per Provider Order: No       Mobility Bed Mobility Overal bed mobility: Needs Assistance Bed Mobility: Supine to Sit, Sit to Supine     Supine to sit: Contact guard, Used  rails, HOB elevated Sit to supine: Contact guard assist, Used rails, HOB elevated   General bed mobility comments: Pt much improved today    Transfers Overall transfer level: Needs assistance Equipment used: Rolling walker (2 wheels) Transfers: Sit to/from Stand Sit to Stand: Min assist           General transfer comment: Pt much improved today     Balance Overall balance assessment: Needs assistance Sitting-balance support: Bilateral upper extremity supported Sitting balance-Leahy Scale: Good     Standing balance support: Single extremity supported, Bilateral upper extremity supported, During functional activity, Reliant on assistive device for balance Standing balance-Leahy Scale: Fair Standing balance comment: Pt much improved today.  Engaged in grooming/hygiene tasks at RW level while standing at sink                           ADL either performed or assessed with clinical judgement   ADL Overall ADL's : Needs assistance/impaired     Grooming: Wash/dry hands;Wash/dry face;Standing;Cueing for safety                                 General ADL Comments: engaged in standing at sink at [facility] level for grooming/hygiene tasks    Extremity/Trunk Assessment Upper Extremity Assessment Upper Extremity Assessment: Generalized weakness   Lower Extremity Assessment Lower Extremity Assessment: Generalized weakness   Cervical / Trunk Assessment Cervical / Trunk Assessment: Normal    Vision Patient Visual Report: No change from baseline     Perception     Praxis     Communication Communication Communication: Impaired Factors Affecting  Communication: Reduced clarity of speech   Cognition Arousal: Alert Behavior During Therapy: WFL for tasks assessed/performed Cognition: No family/caregiver present to determine baseline, No apparent impairments             OT - Cognition Comments: Pt much improved today, cognitively engaged throughout  treatment session                 Following commands: Intact Following commands impaired: Only follows one step commands consistently      Cueing   Cueing Techniques: Verbal cues, Tactile cues  Exercises Other Exercises Other Exercises: Education on transfer safety, establishing BOS in standing at sink to reduce risk of falls during standing ADLs (grooming tasks)    Shoulder Instructions       General Comments      Pertinent Vitals/ Pain       Pain Assessment Pain Assessment: No/denies pain  Home Living                                          Prior Functioning/Environment              Frequency  Min 2X/week        Progress Toward Goals  OT Goals(current goals can now be found in the care plan section)  Progress towards OT goals: Progressing toward goals  Acute Rehab OT Goals Patient Stated Goal: To return home Potential to Achieve Goals: Good  Plan      Co-evaluation                 AM-PAC OT 6 Clicks Daily Activity     Outcome Measure   Help from another person eating meals?: None Help from another person taking care of personal grooming?: A Little Help from another person toileting, which includes using toliet, bedpan, or urinal?: A Lot Help from another person bathing (including washing, rinsing, drying)?: A Little Help from another person to put on and taking off regular upper body clothing?: A Little Help from another person to put on and taking off regular lower body clothing?: A Lot 6 Click Score: 17    End of Session Equipment Utilized During Treatment: Gait belt;Rolling walker (2 wheels)  OT Visit Diagnosis: Muscle weakness (generalized) (M62.81)   Activity Tolerance Patient tolerated treatment well   Patient Left in bed;with call bell/phone within reach;with bed alarm set;with nursing/sitter in room   Nurse Communication Mobility status        Time: 9248-9194 OT Time Calculation (min): 14  min  Charges: OT General Charges $OT Visit: 1 Visit OT Treatments $Self Care/Home Management : 8-22 mins  Harlene Sharps OTR/L   Harlene LITTIE Sharps 06/15/2024, 8:21 AM

## 2024-06-15 NOTE — Consult Note (Signed)
 NEUROLOGY CONSULT NOTE   Date of service: June 15, 2024 Patient Name: Colton Nguyen MRN:  [id number] DOB:  12/17/1945 Chief Complaint: abnormal movements Requesting Provider: Awanda City, MD  History of Present Illness  Colton Nguyen is a 78 y.o. male with hx of ACDF at C4-5 and C5-6, as well as previous lumbar surgery who presents with worsening of his gait.  He states that he has had some trouble keeping his balance, but they are mainly concerned about his restless leg syndrome by which they mean abnormal movements of his lower extremities.  I ask him to demonstrate it, and he jerked his leg upwards with flexion at the knee and ankle(looks like triple flexion).  He has also complained of muscle cramps and abnormal muscle twitching.  He presented to pulmonology for follow-up of his COPD and was referred for workup of his gait dysfunction.   Past History   Past Medical History:  Diagnosis Date   Acute respiratory failure with hypoxia (HCC) 01/28/2023   Anxiety    situational   Arthritis    BPH (benign prostatic hyperplasia)    CAD (coronary artery disease)    CHF (congestive heart failure) (HCC)    pt denies 11/13/2019   Chronic cardiopulmonary disease (HCC)    COPD (chronic obstructive pulmonary disease) (HCC)    History of kidney stones    Hyperlipidemia    Hypertension    Hypertension 12/28/2016   lung ca dx'd 03/2015   Mass of lung    Nicotine  dependence    Personal history of kidney stones    Restless leg    Shortness of breath dyspnea    with exertion   Tuberculosis    Wears glasses     Past Surgical History:  Procedure Laterality Date   BACK SURGERY     BIOPSY  01/25/2020   Procedure: BIOPSY;  Surgeon: Legrand Victory LITTIE DOUGLAS, MD;  Location: [facility];  Service: Gastroenterology;;   CAD CCTA 01/31/15     CERVICAL FUSION     COLONOSCOPY     COLONOSCOPY WITH PROPOFOL  N/A 01/25/2020   Procedure: COLONOSCOPY WITH PROPOFOL ;  Surgeon: Legrand Victory LITTIE DOUGLAS, MD;   Location: [facility];  Service: Gastroenterology;  Laterality: N/A;   ESOPHAGOGASTRODUODENOSCOPY (EGD) WITH PROPOFOL  N/A 01/25/2020   Procedure: ESOPHAGOGASTRODUODENOSCOPY (EGD) WITH PROPOFOL ;  Surgeon: Legrand Victory LITTIE DOUGLAS, MD;  Location: [facility];  Service: Gastroenterology;  Laterality: N/A;   FRACTURE SURGERY     left ankle   HEMOSTASIS CLIP PLACEMENT  01/25/2020   Procedure: HEMOSTASIS CLIP PLACEMENT;  Surgeon: Legrand Victory LITTIE DOUGLAS, MD;  Location: [facility];  Service: Gastroenterology;;   HOT HEMOSTASIS N/A 01/25/2020   Procedure: HOT HEMOSTASIS (ARGON PLASMA COAGULATION/BICAP);  Surgeon: Legrand Victory LITTIE DOUGLAS, MD;  Location: [facility];  Service: Gastroenterology;  Laterality: N/A;   LUNG BIOPSY N/A 02/27/2015   Procedure: LUNG BIOPSY;  Surgeon: Dallas KATHEE Jude, MD;  Location: [facility] OR;  Service: Thoracic;  Laterality: N/A;   right inguinal hernia repair at age 10     VIDEO BRONCHOSCOPY WITH ENDOBRONCHIAL ULTRASOUND N/A 02/27/2015   Procedure: VIDEO BRONCHOSCOPY WITH ENDOBRONCHIAL ULTRASOUND;  Surgeon: Dallas KATHEE Jude, MD;  Location: [facility] OR;  Service: Thoracic;  Laterality: N/A;    Family History: Family History  Problem Relation Age of Onset   Cancer Other    Heart disease Father    Lung cancer Father    Hypertension Other    Diabetes Mother    Valvular heart disease  Mother    Diabetes Sister    Colon polyps Maternal Uncle    Diabetes Sister    Diabetes Sister    Diabetes Niece    Colon cancer Cousin    Esophageal cancer Neg Hx    Stomach cancer Neg Hx    Pancreatic cancer Neg Hx     Social History  reports that he has been smoking cigarettes. He started smoking about 56 years ago. He has a 140.8 pack-year smoking history. He has been exposed to tobacco smoke. He has never used smokeless tobacco. He reports current alcohol use. He reports that he does not use drugs.  No Known Allergies  Medications   Current Facility-Administered Medications:    acetaminophen   (TYLENOL ) tablet 1,000 mg, 1,000 mg, Oral, Q6H PRN, Colton Nguyen, Colton T, MD, 1,000 mg at 06/13/24 2222   aspirin  EC tablet 81 mg, 81 mg, Oral, Daily, Colton Manor T, MD, 81 mg at 06/15/24 0944   azelastine  (ASTELIN ) 0.1 % nasal spray 1 spray, 1 spray, Each Nare, BID, Colton Manor T, MD, 1 spray at 06/15/24 0945   buPROPion  (WELLBUTRIN  SR) 12 hr tablet 150 mg, 150 mg, Oral, BID, Colton Nguyen, Colton T, MD, 150 mg at 06/15/24 0944   cyclobenzaprine  (FLEXERIL ) tablet 5 mg, 5 mg, Oral, TID PRN, Colton Nguyen, Colton T, MD, 5 mg at 06/13/24 2206   enoxaparin  (LOVENOX ) injection 40 mg, 40 mg, Subcutaneous, Q24H, Colton Nguyen, Colton T, MD, 40 mg at 06/14/24 2142   ferrous sulfate  tablet 325 mg, 325 mg, Oral, Q breakfast, Colton Manor T, MD, 325 mg at 06/15/24 9195   finasteride  (PROSCAR ) tablet 5 mg, 5 mg, Oral, Daily, Colton Manor T, MD, 5 mg at 06/15/24 0944   fluticasone  (FLONASE ) 50 MCG/ACT nasal spray 2 spray, 2 spray, Each Nare, Daily PRN, Colton Manor DASEN, MD   ipratropium-albuterol  (DUONEB) 0.5-2.5 (3) MG/3ML nebulizer solution 3 mL, 3 mL, Nebulization, TID, Colton City, MD, 3 mL at 06/15/24 0743   labetalol (NORMODYNE) injection 10 mg, 10 mg, Intravenous, Q4H PRN, Colton Nguyen, Colton T, MD   losartan  (COZAAR ) tablet 50 mg, 50 mg, Oral, Daily, Colton Manor T, MD, 50 mg at 06/15/24 0944   nicotine  (NICODERM CQ  - dosed in mg/24 hours) patch 21 mg, 21 mg, Transdermal, Q24H, Colton Nguyen, Colton T, MD, 21 mg at 06/14/24 8195   ondansetron  (ZOFRAN ) tablet 4 mg, 4 mg, Oral, Q6H PRN **OR** ondansetron  (ZOFRAN ) injection 4 mg, 4 mg, Intravenous, Q6H PRN, Colton Manor T, MD   pantoprazole  (PROTONIX ) EC tablet 40 mg, 40 mg, Oral, BID AC, Colton Nguyen, Colton T, MD, 40 mg at 06/15/24 9195   rOPINIRole  (REQUIP ) tablet 0.5 mg, 0.5 mg, Oral, TID, Colton Manor T, MD, 0.5 mg at 06/15/24 0944   senna-docusate (Senokot-S) tablet 1 tablet, 1 tablet, Oral, QHS PRN, Colton Manor T, MD   tamsulosin  (FLOMAX ) capsule 0.4 mg, 0.4 mg, Oral, QPC supper, Colton Manor T, MD, 0.4 mg at 06/13/24  1840  Vitals   Vitals:   06/15/24 0440 06/15/24 0733 06/15/24 0810 06/15/24 0822  BP: (!) 154/74 (!) 160/82  (!) 170/84  Pulse: 91 88  80  Resp: 17 16  17   Temp: 98.1 F (36.7 C) 98 F (36.7 C)  97.8 F (36.6 C)  TempSrc: Oral Oral  Oral  SpO2: 94% 99% 90% 98%  Weight:      Height:        Body mass index is 21.29 kg/m.   Physical Exam   Constitutional: Appears well-developed and well-nourished.  Neurologic Examination    Neuro: Mental Status: Patient is awake, alert, oriented to person, place, month, year, and situation. Patient is able to give a clear and coherent history. No signs of aphasia or neglect Cranial Nerves: II: Visual Fields are full. Pupils are equal, round, and reactive to light.   III,IV, VI: EOMI without ptosis or diploplia.  V: Facial sensation is symmetric to temperature VII: Facial movement is symmetric.  VIII: hearing is intact to voice X: Uvula elevates symmetrically XII: tongue is midline without atrophy or fasciculations.  Motor: He has relatively good strength in bilateral upper extremities with no drift.  In his legs, possible mild weakness with dorsiflexion, but otherwise 5/5 throughout is there as well. Sensory: Sensation is symmetric to light touch and temperature in the arms and legs. Deep Tendon Reflexes: 2+ and symmetric in patellae.  Difficult to elicit in the ankle, 1-2+ at the biceps Plantars: Toes are downgoing on the right and upgoing on the left Cerebellar: FNF intact, though he does have mild asterixis while he is doing it.  With arms outstretched, he has mild asterixis bilaterally        Labs/Imaging/Neurodiagnostic studies   CBC:  Recent Labs  Lab Jun 19, 2024 1257 06/14/24 0348  WBC 7.6 7.5  HGB 8.8* 8.6*  HCT 29.1* 28.9*  MCV 77.2* 78.1*  PLT 320 316   Basic Metabolic Panel:  Lab Results  Component Value Date   NA 137 06/15/2024   K 4.0 06/15/2024   CO2 33 (H) 06/15/2024   GLUCOSE 74 06/15/2024    BUN 18 06/15/2024   CREATININE 0.89 06/15/2024   CALCIUM  8.5 (L) 06/15/2024   GFRNONAA >60 06/15/2024   GFRAA >60 11/14/2019   Lipid Panel:  Lab Results  Component Value Date   LDLCALC 41 08/09/2023   HgbA1c:  Lab Results  Component Value Date   HGBA1C 5.7 (H) 08/09/2023    INR  Lab Results  Component Value Date   INR 1.1 03/30/2024   APTT  Lab Results  Component Value Date   APTT 23 (L) 02/27/2015     MRI Brain(Personally reviewed): No acute findings  MRI cervical spine-he has a very tight C-spine at C3-4   ASSESSMENT   Santiago Stenzel is a 78 y.o. male with a history of cervical spine disease as well as COPD who presents with worsening gait dysfunction.  He does have mild asterixis on exam, and my suspicion is that this is likely the cause of his worsening gait problems.  He also has significant cervical stenosis, with an upgoing toe on the left.  It is unclear if this is acute, but I suspect it is chronic given that he describes triple flexion going on for years.  I am also not sure what is causing his mild CK elevations, no proximal weakness to suggest myositis.  RECOMMENDATIONS  Have pharmacy review meds for causes of asterixis Minimize hypercarbia if possible Follow-up with neurosurgery as an outpatient Continue to trend CK ______________________________________________________________________    Signed, Aisha Seals, MD [facility]

## 2024-06-15 NOTE — NC FL2 (Signed)
 [place]  MEDICAID [id number] LEVEL OF CARE FORM     IDENTIFICATION  Patient Name: Colton Nguyen Birthdate: March 15, 1946 Sex: male Admission Date (Current Location): 06/13/2024  [facility] and [place] Number:  Chiropodist and Address:  [facility], [address] [place], [place] [address]      Provider Number: [id number]  Attending Physician Name and Address:  Awanda City, MD  Relative Name and Phone Number:       Current Level of Care: SNF Recommended Level of Care: Skilled Nursing Facility Prior Approval Number:    Date Approved/Denied:   PASRR Number: [id number]  Discharge Plan: SNF    Current Diagnoses: Patient Active Problem List   Diagnosis Date Noted   AMS (altered mental status) 06/14/2024   Ataxia 06/13/2024   Frequent falls 06/13/2024   Aspiration pneumonia (HCC) 04/16/2024   PNA (pneumonia) 04/16/2024   Restless leg syndrome 04/14/2024   Depression 04/14/2024   Protein-calorie malnutrition, severe 04/01/2024   COPD exacerbation (HCC) 03/30/2024   Prediabetes 01/30/2023   Chronic respiratory failure with hypoxia, on home O2 therapy (HCC) 01/28/2023   Iron deficiency anemia, unspecified 12/21/2019   Drug induced constipation 12/21/2019   Anxiety 12/21/2019   Tobacco use disorder 12/21/2019   Cognitive communication deficit 12/21/2019   Lumbar stenosis with neurogenic claudication 11/14/2019   BPH (benign prostatic hyperplasia) 08/01/2018   Hypertension 12/28/2016   COPD (chronic obstructive pulmonary disease) (HCC) 12/28/2016   Encounter for nail care 06/03/2015   Smoking trying to quit 04/01/2015   Squamous cell carcinoma of lung, stage III 03/07/2015   spiculated right upper lobe mass 02/14/2015    Orientation RESPIRATION BLADDER Height & Weight     Self, Time, Situation, Place    Continent Weight: 63.5 kg Height:  5' 8 (172.7 cm)  BEHAVIORAL SYMPTOMS/MOOD NEUROLOGICAL BOWEL NUTRITION STATUS      Incontinent  Diet (Heart)  AMBULATORY STATUS COMMUNICATION OF NEEDS Skin   Limited Assist                           Personal Care Assistance Level of Assistance  Bathing, Feeding, Dressing Bathing Assistance: Limited assistance Feeding assistance: Independent Dressing Assistance: Limited assistance     Functional Limitations Info             SPECIAL CARE FACTORS FREQUENCY  PT (By licensed PT), OT (By licensed OT)     PT Frequency: 5 x week OT Frequency: 5 x week            Contractures      Additional Factors Info  Code Status, Allergies Code Status Info: FULL Allergies Info: NKA           Current Medications (06/15/2024):  This is the current hospital active medication list Current Facility-Administered Medications  Medication Dose Route Frequency Provider Last Rate Last Admin   acetaminophen  (TYLENOL ) tablet 1,000 mg  1,000 mg Oral Q6H PRN Laurita Manor T, MD   1,000 mg at 06/13/24 2222   aspirin  EC tablet 81 mg  81 mg Oral Daily Laurita Manor T, MD   81 mg at 06/15/24 0944   azelastine  (ASTELIN ) 0.1 % nasal spray 1 spray  1 spray Each Nare BID Laurita Manor T, MD   1 spray at 06/15/24 0945   buPROPion  (WELLBUTRIN  SR) 12 hr tablet 150 mg  150 mg Oral BID Laurita Manor T, MD   150 mg at 06/15/24 0944   cyclobenzaprine  (FLEXERIL )  tablet 5 mg  5 mg Oral TID PRN Laurita Manor T, MD   5 mg at 06/13/24 2206   enoxaparin  (LOVENOX ) injection 40 mg  40 mg Subcutaneous Q24H Laurita Manor T, MD   40 mg at 06/14/24 2142   ferrous sulfate  tablet 325 mg  325 mg Oral Q breakfast Laurita Manor T, MD   325 mg at 06/15/24 0804   finasteride  (PROSCAR ) tablet 5 mg  5 mg Oral Daily Laurita Manor T, MD   5 mg at 06/15/24 0944   fluticasone  (FLONASE ) 50 MCG/ACT nasal spray 2 spray  2 spray Each Nare Daily PRN Laurita Manor T, MD       ipratropium-albuterol  (DUONEB) 0.5-2.5 (3) MG/3ML nebulizer solution 3 mL  3 mL Nebulization TID Awanda City, MD   3 mL at 06/15/24 0743   labetalol (NORMODYNE) injection 10 mg  10  mg Intravenous Q4H PRN Laurita Manor T, MD       losartan  (COZAAR ) tablet 50 mg  50 mg Oral Daily Zhang, Ping T, MD   50 mg at 06/15/24 0944   nicotine  (NICODERM CQ  - dosed in mg/24 hours) patch 21 mg  21 mg Transdermal Q24H Zhang, Ping T, MD   21 mg at 06/14/24 1804   ondansetron  (ZOFRAN ) tablet 4 mg  4 mg Oral Q6H PRN Laurita Manor DASEN, MD       Or   ondansetron  (ZOFRAN ) injection 4 mg  4 mg Intravenous Q6H PRN Laurita Manor T, MD       pantoprazole  (PROTONIX ) EC tablet 40 mg  40 mg Oral BID AC Zhang, Ping T, MD   40 mg at 06/15/24 9195   rOPINIRole  (REQUIP ) tablet 0.5 mg  0.5 mg Oral TID Laurita Manor T, MD   0.5 mg at 06/15/24 0944   senna-docusate (Senokot-S) tablet 1 tablet  1 tablet Oral QHS PRN Laurita Manor DASEN, MD       tamsulosin  (FLOMAX ) capsule 0.4 mg  0.4 mg Oral QPC supper Laurita Manor T, MD   0.4 mg at 06/13/24 1840     Discharge Medications: Please see discharge summary for a list of discharge medications.  Relevant Imaging Results:  Relevant Lab Results:   Additional Information SSN [id number]  Dalia GORMAN Fuse, RN

## 2024-06-15 NOTE — TOC Initial Note (Addendum)
 Transition of Care [facility]) - Initial/Assessment Note    Patient Details  Name: Colton Nguyen MRN: [id number] Date of Birth: April 03, 1946  Transition of Care [facility]) CM/SW Contact:    Dalia GORMAN Fuse, RN Phone Number: 06/15/2024, 3:27 PM  Clinical Narrative:                  Patient is from home with his son. Current therapy recs are for SNF. TOC offered the patient choice and he doesn't have a preference. After previous acute inpatient admission he discharged to [facility].   Patient with bed offers from [facility]. [facility] is considering. TOC spoke with Tammy and she advised the patient only has 2 SNF days remaining. The patient has a [employer] supplement, but it only covers 10 days. He used 8 of those days and only has 2 days remaining. He would have to prepay the copay days before he could be admitted.  The patient has used the following SNF days  [facility] 7/29-8/2 (4 days)  [facility] 8/15-9/12 (28 days).   TOC spoke with the patient and he doesn't wish to pursue SNF at this time. His preference is home with home health support.     Expected Discharge Plan: Skilled Nursing Facility Barriers to Discharge: Continued Medical Work up   Patient Goals and CMS Choice     Choice offered to / list presented to : Patient      Expected Discharge Plan and Services     Post Acute Care Choice: Skilled Nursing Facility Living arrangements for the past 2 months: Single Family Home                                      Prior Living Arrangements/Services Living arrangements for the past 2 months: Single Family Home Lives with:: Adult Children                   Activities of Daily Living   ADL Screening (condition at time of admission) Independently performs ADLs?: Yes (appropriate for developmental age) Is the patient deaf or have difficulty hearing?: No Does the patient have difficulty seeing, even when wearing glasses/contacts?: No Does the  patient have difficulty concentrating, remembering, or making decisions?: No  Permission Sought/Granted                  Emotional Assessment              Admission diagnosis:  Acute encephalopathy [G93.40] Frequent falls [R29.6] Right sided weakness [R53.1] AMS (altered mental status) [R41.82] Patient Active Problem List   Diagnosis Date Noted   AMS (altered mental status) 06/14/2024   Ataxia 06/13/2024   Frequent falls 06/13/2024   Aspiration pneumonia (HCC) 04/16/2024   PNA (pneumonia) 04/16/2024   Restless leg syndrome 04/14/2024   Depression 04/14/2024   Protein-calorie malnutrition, severe 04/01/2024   COPD exacerbation (HCC) 03/30/2024   Prediabetes 01/30/2023   Chronic respiratory failure with hypoxia, on home O2 therapy (HCC) 01/28/2023   Iron deficiency anemia, unspecified 12/21/2019   Drug induced constipation 12/21/2019   Anxiety 12/21/2019   Tobacco use disorder 12/21/2019   Cognitive communication deficit 12/21/2019   Lumbar stenosis with neurogenic claudication 11/14/2019   BPH (benign prostatic hyperplasia) 08/01/2018   Hypertension 12/28/2016   COPD (chronic obstructive pulmonary disease) (HCC) 12/28/2016   Encounter for nail care 06/03/2015   Smoking trying to quit 04/01/2015  Squamous cell carcinoma of lung, stage III 03/07/2015   spiculated right upper lobe mass 02/14/2015   PCP:  [name], FNP Pharmacy:   [facility] - [place], [place] - [address] [place] [address] Phone: [phone] Fax: [phone]     Social Drivers of Health (SDOH) Social History: SDOH Screenings   Food Insecurity: No Food Insecurity (06/13/2024)  Housing: Low Risk  (06/13/2024)  Transportation Needs: No Transportation Needs (06/13/2024)  Utilities: Not At Risk (06/13/2024)  Depression (PHQ2-9): Low Risk  (01/30/2023)  Financial Resource Strain: Low Risk  (01/30/2023)  Social Connections: Patient Declined  (06/13/2024)  Recent Concern: Social Connections - Socially Isolated (04/16/2024)  Tobacco Use: High Risk (06/13/2024)   SDOH Interventions:     Readmission Risk Interventions     No data to display

## 2024-06-16 DIAGNOSIS — R296 Repeated falls: Secondary | ICD-10-CM | POA: Diagnosis not present

## 2024-06-16 DIAGNOSIS — M4802 Spinal stenosis, cervical region: Secondary | ICD-10-CM | POA: Diagnosis not present

## 2024-06-16 DIAGNOSIS — R799 Abnormal finding of blood chemistry, unspecified: Secondary | ICD-10-CM | POA: Diagnosis not present

## 2024-06-16 DIAGNOSIS — R278 Other lack of coordination: Secondary | ICD-10-CM | POA: Diagnosis not present

## 2024-06-16 LAB — CK: Total CK: 308 U/L (ref 49–397)

## 2024-06-16 NOTE — TOC Progression Note (Signed)
 Transition of Care [facility]) - Progression Note    Patient Details  Name: Colton Nguyen MRN: [id number] Date of Birth: 08/02/1946  [facility] CM/SW Contact  Racheal LITTIE Schimke, RN Phone Number: 06/16/2024, 4:48 PM  Clinical Narrative: HHPT/OT was arranged with [facility]. Hospital Bed ordered with [facility].      Expected Discharge Plan: Skilled Nursing Facility Barriers to Discharge: Continued Medical Work up               Expected Discharge Plan and Services     Post Acute Care Choice: Skilled Nursing Facility Living arrangements for the past 2 months: Single Family Home                                       Social Drivers of Health (SDOH) Interventions SDOH Screenings   Food Insecurity: No Food Insecurity (06/13/2024)  Housing: Low Risk  (06/13/2024)  Transportation Needs: No Transportation Needs (06/13/2024)  Utilities: Not At Risk (06/13/2024)  Depression (PHQ2-9): Low Risk  (01/30/2023)  Financial Resource Strain: Low Risk  (01/30/2023)  Social Connections: Patient Declined (06/13/2024)  Recent Concern: Social Connections - Socially Isolated (04/16/2024)  Tobacco Use: High Risk (06/13/2024)    Readmission Risk Interventions     No data to display

## 2024-06-16 NOTE — Progress Notes (Signed)
 Occupational Therapy Treatment Patient Details Name: Colton Nguyen MRN: [id number] DOB: 11-28-1945 Today's Date: 06/16/2024   History of present illness Pt is a 78 y.o. 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   OT comments  Pt seen for OT treatment on this date. Upon arrival to room pt supine in bed, agreeable to tx. Pt requires CGA for bed mobility and functional transfers utilizing RW.  Engaged in UB dressing tasks at EOB with Min A for clothing management. Pt making great progress toward goals, will continue to follow POC. Discharge recommendation remains appropriate.        If plan is discharge home, recommend the following:  A little help with walking and/or transfers;A little help with bathing/dressing/bathroom;Assistance with cooking/housework;Help with stairs or ramp for entrance;Assist for transportation   Equipment Recommendations  Other (comment) (defer to next venue of care.)    Recommendations for Other Services      Precautions / Restrictions Precautions Precautions: Fall Recall of Precautions/Restrictions: Impaired Restrictions Weight Bearing Restrictions Per Provider Order: No       Mobility Bed Mobility Overal bed mobility: Needs Assistance Bed Mobility: Supine to Sit Rolling: Contact guard assist   Supine to sit: Contact guard, Used rails, HOB elevated          Transfers Overall transfer level: Needs assistance Equipment used: Rolling walker (2 wheels) Transfers: Sit to/from Stand Sit to Stand: Contact guard assist                 Balance Overall balance assessment: Needs assistance Sitting-balance  support: Bilateral upper extremity supported, Feet supported, Single extremity supported Sitting balance-Leahy Scale: Good Sitting balance - Comments: seated EOB for UB dressing tasks (doff/donn hospital gown)   Standing balance support: Bilateral upper extremity supported, During functional activity, Reliant on assistive device for balance Standing balance-Leahy Scale: Fair                             ADL either performed or assessed with clinical judgement   ADL Overall ADL's : Needs assistance/impaired Eating/Feeding: Set up;Sitting               Upper Body Dressing : Minimal assistance;Sitting       Toilet Transfer: Contact guard assist;Rolling walker (2 wheels) Toilet Transfer Details (indicate cue type and reason): simulation         Functional mobility during ADLs: Contact guard assist;Rolling walker (2 wheels);Cueing for sequencing;Cueing for safety      Extremity/Trunk Assessment Upper Extremity Assessment Upper Extremity Assessment: Generalized weakness   Lower Extremity Assessment Lower Extremity Assessment: Generalized weakness        Vision Patient Visual Report: No change from baseline     Perception     Praxis     Communication Communication Communication: Impaired Factors Affecting Communication: Reduced clarity of speech   Cognition Arousal: Alert Behavior During Therapy: WFL for tasks assessed/performed Cognition: No apparent impairments                               Following commands: Intact Following commands impaired: Only follows one  step commands consistently      Cueing   Cueing Techniques: Verbal cues, Tactile cues  Exercises Other Exercises Other Exercises: Education on functional transfer safety    Shoulder Instructions       General Comments      Pertinent Vitals/ Pain       Pain Assessment Pain Assessment: No/denies pain  Home Living                                           Prior Functioning/Environment              Frequency  Min 2X/week        Progress Toward Goals  OT Goals(current goals can now be found in the care plan section)  Progress towards OT goals: Progressing toward goals  Acute Rehab OT Goals Potential to Achieve Goals: Good  Plan      Co-evaluation                 AM-PAC OT 6 Clicks Daily Activity     Outcome Measure   Help from another person eating meals?: None Help from another person taking care of personal grooming?: A Little Help from another person toileting, which includes using toliet, bedpan, or urinal?: A Little Help from another person bathing (including washing, rinsing, drying)?: A Little Help from another person to put on and taking off regular upper body clothing?: A Little Help from another person to put on and taking off regular lower body clothing?: A Lot 6 Click Score: 18    End of Session Equipment Utilized During Treatment: Gait belt;Rolling walker (2 wheels)  OT Visit Diagnosis: Muscle weakness (generalized) (M62.81)   Activity Tolerance Patient tolerated treatment well   Patient Left in chair;with call bell/phone within reach;with chair alarm set   Nurse Communication Mobility status        Time: 9088-9070 OT Time Calculation (min): 18 min  Charges: OT General Charges $OT Visit: 1 Visit OT Treatments $Self Care/Home Management : 8-22 mins  Harlene Sharps OTR/L   Harlene LITTIE Sharps 06/16/2024, 3:09 PM

## 2024-06-16 NOTE — Progress Notes (Signed)
 Physical Therapy Treatment Patient Details Name: Colton Nguyen MRN: [id number] DOB: 11/02/1945 Today's Date: 06/16/2024   History of Present Illness Pt is a 78 y.o. 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    PT Comments  Pt alert, pleasant and motivated to participate in PT tx. Pt was met semi-supine in bed, able to perform BLE exercises with good tolerance. Pt performed bed mobility with supervision-CGA this date with use of bed features. Initial STS from EOB with minA to attain full standing. Pt able to amb around bed with RW and CGA, mod VC for proper RW positioning, no LOB. Pt performed additional STS from elevated EOB x5 for LE strength and endurance with emphasis on eccentric lowering to sitting. Additional static balance practice in standing with mild increased postural sway with no UE support. HR and SpO2 WFL throughout with pt on 2.5L supplemental O2. Pt was left semi-supine in bed at end of session, citing muscle fatigue in BLEs, all needs in reach. The patient would benefit from further skilled PT intervention to continue to progress towards goals.      If plan is discharge home, recommend the following: A lot of help with walking and/or transfers;A lot of help with bathing/dressing/bathroom;Assistance with cooking/housework;Direct supervision/assist for medications management;Direct supervision/assist for financial management;Assist for transportation;Help with stairs or ramp for entrance   Can travel by private vehicle     Yes  Equipment Recommendations  Other (comment) (TBD at next venue of care)    Recommendations for Other Services        Precautions / Restrictions Precautions Precautions: Fall Recall of Precautions/Restrictions: Impaired Restrictions Weight Bearing Restrictions Per Provider Order: No     Mobility  Bed Mobility Overal bed mobility: Needs Assistance Bed Mobility: Supine to Sit, Sit to Supine     Supine to sit: Supervision, HOB elevated, Used rails Sit to supine: Contact guard assist   General bed mobility comments: no physical assistance required, inc time/effort and use of bed features    Transfers Overall transfer level: Needs assistance Equipment used: Rolling walker (2 wheels) Transfers: Sit to/from Stand Sit to Stand: Min assist, Contact guard assist, From elevated surface           General transfer comment: initial STS from EOB with minA, progressed to CGA for repetitive STS from elevated EOB x5, VC for proper hand placement    Ambulation/Gait Ambulation/Gait assistance: Contact guard assist Gait Distance (Feet): 10 Feet Assistive device: Rolling walker (2 wheels) Gait Pattern/deviations: Step-through pattern, Decreased stride length, Narrow base of support Gait velocity: decreased     General Gait Details: able to amb around bed with no LOB, narrow BOS and mod VC for RW positioning   Stairs             Wheelchair Mobility     Tilt Bed    Modified Rankin (Stroke Patients Only)       Balance Overall balance assessment: Needs assistance Sitting-balance support: Feet supported Sitting balance-Leahy Scale: Good Sitting balance - Comments: steady static and dynamic sitting   Standing balance support: Bilateral upper extremity supported, No upper extremity supported Standing balance-Leahy Scale: Fair Standing balance comment: increased postural sway with no UE  support and dynamic standing tasks                            Communication Communication Communication: Impaired Factors Affecting Communication: Reduced clarity of speech  Cognition  Arousal: Alert Behavior During Therapy: WFL for tasks assessed/performed   PT - Cognitive impairments: Orientation, Memory   Orientation impairments: Place, Time                   PT - Cognition Comments: A&Ox2, displays memory deficits stating he had never used a RW, had not gotten out of bed today Following commands: Intact      Cueing Cueing Techniques: Verbal cues, Visual cues, Tactile cues  Exercises General Exercises - Lower Extremity Ankle Circles/Pumps: AROM, Both, 10 reps, Supine Long Arc Quad: AROM, Both, 10 reps, Seated Heel Slides: AROM, Both, 10 reps, Supine Hip ABduction/ADduction: AROM, Both, 10 reps, Supine Hip Flexion/Marching: AROM, Both, 10 reps, Seated Other Exercises Other Exercises: Pt performed 5 repetitive STS from elevated EOB for LE strength and endurance, emphasis on eccentric control to sitting. Additional practice with static standing no UE support eyes open and eyes closed and tandem stance with UE support. Other Exercises: HR and SpO2 monitored during session with pt on 2.5L [place] throughout session: SpO2 96% at rest HR in mid 70s. Sitting EOB SpO2 93% HR low 80s. After bout of amb SpO2 92% HR high 80s.    General Comments        Pertinent Vitals/Pain Pain Assessment Pain Assessment: No/denies pain    Home Living                          Prior Function            PT Goals (current goals can now be found in the care plan section) Progress towards PT goals: Progressing toward goals    Frequency    Min 2X/week      PT Plan      Co-evaluation              AM-PAC PT 6 Clicks Mobility   Outcome Measure  Help needed turning from your back to your side while in a flat bed without using bedrails?: None Help needed moving from lying on your back to sitting on the side of a flat bed without using bedrails?: A Little Help needed moving to and from a bed to a chair (including a wheelchair)?: A Little Help needed  standing up from a chair using your arms (e.g., wheelchair or bedside chair)?: A Little Help needed to walk in hospital room?: A Little Help needed climbing 3-5 steps with a railing? : A Lot 6 Click Score: 18    End of Session Equipment Utilized During Treatment: Gait belt;Oxygen  Activity Tolerance: Patient tolerated treatment well Patient left: in bed;with call bell/phone within reach;with bed alarm set Nurse Communication: Mobility status PT Visit Diagnosis: Unsteadiness on feet (R26.81);Other abnormalities of gait and mobility (R26.89);Muscle weakness (generalized) (M62.81);Difficulty in walking, not elsewhere classified (R26.2)     Time: 8481-8458 PT Time Calculation (min) (ACUTE ONLY): 23 min  Charges:    $Therapeutic Exercise: 23-37 mins PT General Charges $$ ACUTE PT VISIT: 1 Visit                     Jakiah Bienaime, SPT

## 2024-06-16 NOTE — Progress Notes (Signed)
    Durable Medical Equipment  (From admission, onward)           Start     Ordered   06/16/24 1634  For home use only DME Hospital bed  Once       Question Answer Comment  Length of Need Lifetime   Bed type Semi-electric      06/16/24 1633

## 2024-06-16 NOTE — Plan of Care (Signed)
  Problem: Clinical Measurements: Goal: Respiratory complications will improve Outcome: Progressing   Problem: Respiratory: Goal: Levels of oxygenation will improve Outcome: Progressing Goal: Ability to maintain adequate ventilation will improve Outcome: Progressing

## 2024-06-16 NOTE — Progress Notes (Signed)
 NEUROLOGY CONSULT FOLLOW UP NOTE   Date of service: June 16, 2024 Patient Name: Colton Nguyen MRN:  [id number] DOB:  01-20-1946  Interval Hx/subjective   No acute events Vitals   Vitals:   06/15/24 1952 06/15/24 2004 06/16/24 0435 06/16/24 0730  BP:  110/74 129/68 135/71  Pulse:  78 94 90  Resp:  16 16 18   Temp:  98 F (36.7 C) 98 F (36.7 C) 98.9 F (37.2 C)  TempSrc:    Oral  SpO2: 98% 99% 100% 98%  Weight:      Height:         Body mass index is 21.29 kg/m.  Physical Exam   Constitutional: Appears well-developed and well-nourished.  Neurologic Examination    MS: awake, alert, gives month as September but otherwise oriented.  CN: PERRL, EOMI Motor: mild dorsiflexion weakness bilaterally Sensory: intact to LT  His asterixis is resolved today.   Medications  Current Facility-Administered Medications:    acetaminophen  (TYLENOL ) tablet 1,000 mg, 1,000 mg, Oral, Q6H PRN, Laurita Manor T, MD, 1,000 mg at 06/13/24 2222   aspirin  EC tablet 81 mg, 81 mg, Oral, Daily, Laurita Manor T, MD, 81 mg at 06/16/24 0813   azelastine  (ASTELIN ) 0.1 % nasal spray 1 spray, 1 spray, Each Nare, BID, Laurita Manor T, MD, 1 spray at 06/16/24 0813   cyclobenzaprine  (FLEXERIL ) tablet 5 mg, 5 mg, Oral, TID PRN, Zhang, Ping T, MD, 5 mg at 06/13/24 2206   enoxaparin  (LOVENOX ) injection 40 mg, 40 mg, Subcutaneous, Q24H, Zhang, Ping T, MD, 40 mg at 06/15/24 2333   ferrous sulfate  tablet 325 mg, 325 mg, Oral, Q breakfast, Laurita Manor T, MD, 325 mg at 06/16/24 0813   finasteride  (PROSCAR ) tablet 5 mg, 5 mg, Oral, Daily, Laurita Manor T, MD, 5 mg at 06/16/24 0813   fluticasone  (FLONASE ) 50 MCG/ACT nasal spray 2 spray, 2 spray, Each Nare, Daily PRN, Laurita Manor DASEN, MD   ipratropium-albuterol  (DUONEB) 0.5-2.5 (3) MG/3ML nebulizer solution 3 mL, 3 mL, Nebulization, TID, [place]anda [place], MD, 3 mL at 06/16/24 0716   labetalol (NORMODYNE) injection 10 mg, 10 mg, Intravenous, Q4H PRN, Zhang, Ping T, MD    losartan  (COZAAR ) tablet 50 mg, 50 mg, Oral, Daily, Zhang, Ping T, MD, 50 mg at 06/16/24 0813   nicotine  (NICODERM CQ  - dosed in mg/24 hours) patch 21 mg, 21 mg, Transdermal, Q24H, Zhang, Ping T, MD, 21 mg at 06/15/24 1716   ondansetron  (ZOFRAN ) tablet 4 mg, 4 mg, Oral, Q6H PRN **OR** ondansetron  (ZOFRAN ) injection 4 mg, 4 mg, Intravenous, Q6H PRN, Laurita Manor T, MD   pantoprazole  (PROTONIX ) EC tablet 40 mg, 40 mg, Oral, BID AC, Zhang, Ping T, MD, 40 mg at 06/16/24 0813   rOPINIRole  (REQUIP ) tablet 0.5 mg, 0.5 mg, Oral, TID, Laurita Manor T, MD, 0.5 mg at 06/16/24 0813   senna-docusate (Senokot-S) tablet 1 tablet, 1 tablet, Oral, QHS PRN, Laurita Manor DASEN, MD   tamsulosin  (FLOMAX ) capsule 0.4 mg, 0.4 mg, Oral, QPC supper, Laurita Manor T, MD, 0.4 mg at 06/15/24 1716  Labs and Diagnostic Imaging   Ammonia 22   Assessment   Colton Nguyen is a 78 y.o. male with a history of cervical spine disease as well as COPD who presents with worsening gait dysfunction.  He does have mild asterixis on exam, and my suspicion is that this is likely the cause of his worsening gait problems.  He also has significant cervical stenosis, with an upgoing toe on the  left.  It is unclear if this pathology is contributing, but I suspect it is chronic given that he describes triple flexion going on for years.  I am also not sure what is causing his mild CK elevations, no proximal weakness to suggest myositis.   Recommendations  Repeat CK today  ______________________________________________________________________   Signed, Aisha Seals, MD [facility]

## 2024-06-16 NOTE — Plan of Care (Signed)
  Problem: Clinical Measurements: Goal: Ability to maintain clinical measurements within normal limits will improve Outcome: Progressing Goal: Diagnostic test results will improve Outcome: Progressing Goal: Respiratory complications will improve Outcome: Progressing   Problem: Activity: Goal: Risk for activity intolerance will decrease Outcome: Progressing   Problem: Nutrition: Goal: Adequate nutrition will be maintained Outcome: Progressing   Problem: Elimination: Goal: Will not experience complications related to bowel motility Outcome: Progressing Goal: Will not experience complications related to urinary retention Outcome: Progressing   Problem: Safety: Goal: Ability to remain free from injury will improve Outcome: Progressing   Problem: Skin Integrity: Goal: Risk for impaired skin integrity will decrease Outcome: Progressing

## 2024-06-16 NOTE — Progress Notes (Addendum)
 PROGRESS NOTE    Colton Nguyen  FMW:[id number] DOB: Nov 15, 1945 DOA: 06/13/2024 PCP: [name], FNP  104A/104A-AA  LOS: 2 days   Brief hospital course:   Assessment & Plan: Colton Nguyen is a 78 y.o. 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   Patient was recently hospitalized 2 months ago for COPD and pneumonia and discharged to nursing home.  After extensive stay at nursing home patient went home.  At baseline patient uses roller walker to ambulate however after the discharge from nursing home patient sister noticed patient has had trouble to keep balance, and symptoms gradually getting worse and in the last 3 days patient has had multiple falls.  Sister attributed the frequent falls at least partly to  uncontrolled restless leg syndrome as patient was staying twisting turning and patient reported that his right leg appears to be more stiff than the left side, hard to control when starting to walk.  But he denied any head or neck injury or LOC.  Moreover, he denied any weakness of any 1 side of the limbs.  No decrease sensation either.  In addition sister also noticed the patient has intermittent  muscle twitching randomly in the arms and legs and patient reported muscle cramps despite taking Flexeril .  Today, patient went back to his pulmonology for follow-up of COPD, and pulmonology was concerned about patient had a stroke at some point and sent patient to ED.   Ataxia Acute on chronic ambulatory impairment Cervical stenosis --MRI brain and cervical spine without acute finding --neuro consulted --outpatient f/u with  neurosurgery   Jerking Possible myoclonus  --EEG ordered on admission, wnl. --d/c'ed bupropion   Rhabdo --CK remains the same despite IVF.  Possible high CK at baseline --hold further IVF.   Worsening of restless leg syndrome --cont Requip    Chronic hypoxic and hypercapnic respiratory failure on 2L O2 PRN Compensated chronic respiratory acidosis --Continue supplemental O2 to keep sats >=90% --stress importance of compliance with home BiPAP  Anxiety/depression --d/c'ed bupropion   --will start another anti-depressant   BPH - Continue Proscar  and Flomax    HLD --hold statin  HTN Chronic HFpEF --cont losartan    DVT prophylaxis: Lovenox  SQ Code Status: Full code  Family Communication: sister updated at bedside today Level of care: Telemetry Medical Dispo:   The patient is from: home Anticipated d/c is to: home.  No SNF days left. Anticipated d/c date is: tomorrow   Subjective and Interval History:  Pt was alert, interactive and knew what's going on today.  No complaint.      Objective: Vitals:   06/16/24 0435 06/16/24 0730 06/16/24 1543 06/16/24 1931  BP: 129/68 135/71 (!) 154/79   Pulse: 94 90 81   Resp: 16 18 18    Temp: 98 F (36.7 C) 98.9 F (37.2 C) 98.4 F (36.9 C)   TempSrc:  Oral Oral   SpO2: 100% 98% 100% 98%  Weight:      Height:        Intake/Output Summary (Last 24 hours) at 06/16/2024 1944 Last data filed at 06/16/2024 1830 Gross per 24 hour  Intake 120 ml  Output 350 ml  Net -230 ml   [employer]  06/13/24 2045  Weight: 63.5 kg    Examination:   Constitutional: NAD, AAOx3 HEENT: conjunctivae and lids normal, EOMI CV: No cyanosis.   RESP: normal respiratory effort, on 2L Neuro: II - XII grossly intact.   Psych: Normal mood and affect.  Appropriate judgement and reason   Data Reviewed: I have personally reviewed labs and imaging studies  Time spent: 50 minutes  Ellouise Haber, MD [facility] If 7PM-7AM, please contact  night-coverage 06/16/2024, 7:44 PM

## 2024-06-17 DIAGNOSIS — R296 Repeated falls: Secondary | ICD-10-CM | POA: Diagnosis not present

## 2024-06-17 MED ORDER — IPRATROPIUM-ALBUTEROL 0.5-2.5 (3) MG/3ML IN SOLN: 3.0000 mL | RESPIRATORY_TRACT | Status: DC | PRN

## 2024-06-17 NOTE — Discharge Summary (Signed)
 Physician Discharge Summary   Colton Nguyen  male DOB: July 19, 1946  FMW:[id number]  PCP: Colton Alan HERO, FNP  Admit date: 06/13/2024 Discharge date: 06/17/2024  Admitted From: home Disposition:  home.  No SNF rehab days left. Sister updated about discharge plans. Home Health: Yes CODE STATUS: Full code  Discharge Instructions     Diet - low sodium heart healthy   Complete by: As directed       Hospital Course:  For full details, please see H&P, progress notes, consult notes and ancillary notes.  Briefly,  Colton Nguyen is a 78 y.o. 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   Patient was recently hospitalized 2 months ago for COPD and pneumonia and discharged to nursing home.  After extensive stay at nursing home patient went home.  At baseline patient uses roller walker to ambulate however after the discharge from nursing home, patient's sister noticed patient has had trouble to keep balance, and symptoms gradually getting worse and in the last 3 days with patient having multiple falls.    Patient was at pulmonology for follow-up of COPD, and pulmonology was concerned that patient had a stroke at some point and sent patient to ED.   Ataxia Acute on chronic ambulatory impairment Cervical stenosis --MRI brain and cervical spine without acute finding --neuro consulted and rec f/u with pt's outpatient neurosurgeon Dr. Gillie.   Jerking Possible myoclonus  --EEG ordered on admission, wnl.  Per neuro, symptoms likely due to hypercapnia.    Chronic hypoxic and hypercapnic respiratory failure on 2L O2 PRN Compensated chronic respiratory  acidosis --per sister, pt has a machine with mask that he is supposed to wear with sleep, but pt is not compliant.   --Continue home supplemental O2 to keep sats >=90% --stress the importance of compliance with home PAP.  Rhabdo --CK 848 on presentation, remained elevated for 3 days even with MIVF.  Home Lipitor was held, and CK finally normalized on day 4.  Home lipitor therefore held on discharge, pending outpatient f/u.   Worsening of restless leg syndrome --cont home Requip    Anxiety/depression --discussed the possibility of bupropion  causing myoclonus, however, it is thought that the risk of side effect is low, and pt has an alternative etiology for having myoclonus, decision therefore was made to continue home bupropion  since it has been helpful for pt's insomnia.     BPH - Continue Proscar  and Flomax    HLD --hold home lipitor due to possibility of it causing rhabo.     HTN Chronic HFpEF --cont losartan  --D/c home lasix  due to low normal BP   Unless noted above, medications under STOP list are ones pt was not taking PTA.  Discharge Diagnoses:  Principal Problem:   Frequent falls Active Problems:   Ataxia   AMS (altered mental status)   30 Day Unplanned Readmission Risk Score    Flowsheet Row ED to Hosp-Admission (Current) from 06/13/2024 in [facility] 1C MEDICAL TELEMETRY  30 Day Unplanned Readmission Risk Score (%) 23.16 Filed at 06/17/2024 0800    This score is the patient's risk of an unplanned readmission within 30 days of being discharged (0 -100%). The score is based on  dignosis, age, lab data, medications, orders, and past utilization.   Low:  0-14.9   Medium: 15-21.9   High: 22-29.9   Extreme: 30 and above         Discharge Instructions:  Allergies as of 06/17/2024   No Known Allergies      Medication List     PAUSE taking these medications    atorvastatin  40 MG tablet Wait to take this until your doctor or other care  provider tells you to start again. Due to elevated CK. Commonly known as: LIPITOR Take 1 tablet (40 mg total) by mouth daily.       STOP taking these medications    furosemide  20 MG tablet Commonly known as: LASIX    methylPREDNISolone  4 MG Tbpk tablet Commonly known as: MEDROL  DOSEPAK       TAKE these medications    acetaminophen  500 MG tablet Commonly known as: TYLENOL  Take 1,000 mg by mouth every 6 (six) hours as needed for moderate pain or headache.   albuterol  108 (90 Base) MCG/ACT inhaler Commonly known as: VENTOLIN  HFA Inhale 1-2 puffs into the lungs every 4 (four) hours as needed for wheezing or shortness of breath.   aspirin  81 MG tablet Take 81 mg by mouth daily.   azelastine  0.1 % nasal spray Commonly known as: ASTELIN  Place 1 spray into both nostrils 2 (two) times daily.   buPROPion  150 MG 12 hr tablet Commonly known as: Wellbutrin  SR Take 1 tablet (150 mg total) by mouth 2 (two) times daily.   cyclobenzaprine  5 MG tablet Commonly known as: FLEXERIL  TAKE ONE TABLET (5 MG TOTAL) BY MOUTH THREE TIMES DAILY AS NEEDED FOR MUSCLE SPASMS.   ferrous sulfate  325 (65 FE) MG tablet Take 1 tablet (325 mg total) by mouth daily with breakfast.   finasteride  5 MG tablet Commonly known as: PROSCAR  Take 1 tablet (5 mg total) by mouth daily.   fluticasone  50 MCG/ACT nasal spray Commonly known as: FLONASE  Place 2 sprays into both nostrils daily as needed for allergies.   ipratropium-albuterol  0.5-2.5 (3) MG/3ML Soln Commonly known as: DUONEB Take 3 mLs by nebulization 4 (four) times daily.   losartan  50 MG tablet Commonly known as: COZAAR  Take 1 tablet (50 mg total) by mouth daily.   nicotine  21 mg/24hr patch Commonly known as: NICODERM CQ  - dosed in mg/24 hours PLACE ONE PATCH (21 MG TOTAL) ONTO THE SKIN DAILY. What changed: See the new instructions.   pantoprazole  40 MG tablet Commonly known as: PROTONIX  Take 1 tablet (40 mg total) by mouth 2 (two)  times daily before a meal.   rOPINIRole  0.25 MG tablet Commonly known as: REQUIP  TAKE ONE TABLET (0.25 MG TOTAL) BY MOUTH THREE TIMES DAILY. What changed: See the new instructions.   tamsulosin  0.4 MG Caps capsule Commonly known as: FLOMAX  TAKE ONE CAPSULE (0.4 MG TOTAL) BY MOUTH DAILY. (NEEDS APPT FOR FUTURE FILL)   Vitamin D3 125 MCG (5000 UT) Tabs Take 5,000 Units by mouth daily.               Durable Medical Equipment  (From admission, onward)           Start     Ordered   06/16/24 1634  For home use only DME Hospital bed  Once       Question Answer Comment  Length of Need Lifetime   Bed type Semi-electric      06/16/24 1633  Follow-up Information     Colton Alan HERO, FNP Follow up in 1 week(s).   Specialty: Family Medicine Why: hospital follow up Contact information: [address] [place] [address] [phone]         Gillie Duncans, MD Follow up.   Specialty: Neurosurgery Why: cervical spine stenosis Contact information: [address] [place] [address] [phone]                 No Known Allergies   The results of significant diagnostics from this hospitalization (including imaging, microbiology, ancillary and laboratory) are listed below for reference.   Consultations:   Procedures/Studies: EEG adult Result Date: 06/14/2024 Michaela Aisha SQUIBB, MD     06/14/2024  1:52 PM History: 78 year old male being evaluated for muscle twitching, evaluating for seizures EEG Duration: 20 minutes and 49 seconds Sedation: None Patient State: Awake and asleep Technique: This EEG was acquired with electrodes placed according to the International 10-20 electrode system (including Fp1, Fp2, F3, F4, C3, C4, P3, P4, O1, O2, T3, T4, T5, T6, A1, A2, Fz, Cz, Pz). The following electrodes were missing or displaced: none. Background: The background consists of intermixed alpha and beta activities. There is a  well defined posterior dominant rhythm of 8-9 Hz that attenuates with eye opening. Sleep is recorded with normal appearing structures. Photic stimulation: Physiologic driving is not performed EEG Abnormalities: None Clinical Interpretation: This normal EEG is recorded in the waking and sleep state. There was no seizure or seizure predisposition recorded on this study. Please note that lack of epileptiform activity on EEG does not preclude the possibility of epilepsy. Aisha Michaela, MD [facility] If 7pm- 7am, please page neurology on call as listed in AMION.  DG Chest 1 View Result Date: 06/14/2024 CLINICAL DATA:  Congestive heart failure. EXAM: CHEST  1 VIEW COMPARISON:  Radiograph yesterday, CT 04/16/2024 FINDINGS: The heart is normal in size. Unchanged mediastinal contours with aortic tortuosity. Chronic scarring at the right lung apex. Chronic scarring at the right lung base. Small right pleural effusion which is likely in part chronic. No pulmonary edema. No acute airspace disease. No pneumothorax. Stable osseous structures. IMPRESSION: 1. Stable radiographic appearance of the chest, no acute findings. Stable scarring at the right lung. 2. Blunting of the right costophrenic angle with small right pleural effusion that is likely chronic. Electronically Signed   By: Andrea Gasman M.D.   On: 06/14/2024 09:59   MR CERVICAL SPINE WO CONTRAST Result Date: 06/14/2024 EXAM: MRI CERVICAL SPINE WITHOUT CONTRAST 06/14/2024 07:45:00 AM TECHNIQUE: Multiplanar multisequence MRI of the cervical spine was performed. COMPARISON: Cervical spine MRI 06/29/2000, cervical spine CT 05/03/2024, brain MRI 06/14/2024 (reported separately), and CTA chest 03/30/2024. CLINICAL HISTORY: 78 year old male with cervical spinal stenosis, frequent falls, and worsening restless leg symptoms. FINDINGS: LIMITATIONS/ARTIFACTS: Study is moderately degraded by motion artifact despite repeated imaging attempts. BONES AND  ALIGNMENT: Normal alignment. Normal vertebral body heights. Bone marrow signal is unremarkable. Cervical ACDF hardware artifact is evident at C4-C5 and C5-C6. Solid arthrodesis there by CT in August. Mild degenerative endplate marrow edema at the C3-C4 adjacent segment. SPINAL CORD: Normal spinal cord size. No obvious cervical spinal cord signal abnormality. Mild degenerative spinal cord mass effect at C3-C4. No convincing cord mass effect at C2-C3. SOFT TISSUES: No paraspinal mass. Preserved major vascular flow voids in the bilateral neck. Abnormal right lung apex as seen on CTA chest 03/30/2024. C2-C3: Mild to moderate degeneration with mild spinal stenosis. No convincing cord  mass effect. No neural foraminal narrowing. C3-C4: Moderate to severe adjacent segment disease, better demonstrated by CT due to motion today. Multifactorial spinal stenosis is evident on series 12 image 10 and appears moderate with mild degenerative spinal cord mass effect. C4-C5: Cervical ACDF hardware artifact is evident. Solid arthrodesis by CT in August. C5-C6: Cervical ACDF hardware artifact is evident. Solid arthrodesis by CT in August. C6-C7: Mild to moderate adjacent segment degeneration with no significant spinal stenosis. IMPRESSION: 1. Motion degraded exam.  Prior C4 to C6 ACDF. 2. Moderate to severe adjacent segment disease at C3-C4 with degenerative endplate marrow edema, Multifactorial spinal stenosis (moderate) with mild spinal cord mass effect. No convincing cord signal abnormality. 3. Degeneration at C2-C3 with mild spinal stenosis . 4. Mild to moderate adjacent segment degeneration at C6-C7 without significant spinal stenosis. Electronically signed by: Helayne Hurst MD 06/14/2024 08:12 AM EDT RP Workstation: [id number]   MR BRAIN WO CONTRAST Result Date: 06/14/2024 EXAM: MR Brain without Intravenous Contrast. CLINICAL HISTORY: 78 year old male with acute neuro deficit, suspected stroke, frequent falls, and worsening  restless leg symptoms. TECHNIQUE: Magnetic resonance images of the brain without intravenous contrast in multiple planes. The study is mildly degraded by motion artifact despite repeated imaging attempts. No diagnostic axial T1 or coronal T2 weighted imaging can be obtained. CONTRAST: Without. COMPARISON: Head CT yesterday, brain MRI 02/21/2015. FINDINGS: BRAIN: No restricted diffusion to indicate acute infarction. No intracranial mass or hemorrhage. No midline shift or extra-axial fluid collection. No cerebellar tonsillar ectopia. Major vascular flow voids are stable since 2016, dominant left vertebral artery (normal variant). Cerebral volume remains within normal limits for age. VENTRICLES: No hydrocephalus. ORBITS: The orbits are normal. SINUSES AND MASTOIDS: The sinuses and mastoid air cells are clear. BONES: Upper cervical spine is poorly evaluated due to motion. IMPRESSION: 1. Mildly degraded by motion artifact, limiting evaluation. No acute intracranial abnormality. Electronically signed by: Helayne Hurst MD 06/14/2024 08:04 AM EDT RP Workstation: [id number]   DG Chest 1 View Result Date: 06/13/2024 CLINICAL DATA:  Shortness of breath EXAM: PORTABLE CHEST 1 VIEW COMPARISON:  Film from earlier in the same day. FINDINGS: Cardiac shadow is stable. Tortuous thoracic aorta is again identified. Chronic volume loss in the right apex is again seen stable over multiple previous exams. Multiple skin folds are noted over the right upper lung. Similar findings noted on the left. No definitive pneumothorax is seen. Healing left ninth rib fracture is again noted. Chronic blunting of the right costophrenic angle is seen. The increased interstitial markings in the left lung are stable. IMPRESSION: Stable appearance of the chest.  Multiple skin folds are noted. Electronically Signed   By: Oneil Devonshire M.D.   On: 06/13/2024 19:28   US  Venous Img Lower Bilateral (DVT) Result Date: 06/13/2024 CLINICAL DATA:  13689 Swelling  13689 EXAM: BILATERAL LOWER EXTREMITY VENOUS DOPPLER ULTRASOUND TECHNIQUE: Gray-scale sonography with graded compression, as well as color Doppler and duplex ultrasound were performed to evaluate the lower extremity deep venous systems from the level of the common femoral vein and including the common femoral, femoral, profunda femoral, popliteal and calf veins including the posterior tibial, peroneal and gastrocnemius veins when visible. The superficial great saphenous vein was also interrogated. Spectral Doppler was utilized to evaluate flow at rest and with distal augmentation maneuvers in the common femoral, femoral and popliteal veins. COMPARISON:  None Available. FINDINGS: RIGHT LOWER EXTREMITY Common Femoral Vein: No evidence of thrombus. Normal compressibility, respiratory phasicity and response to augmentation. Saphenofemoral Junction: No evidence  of thrombus. Normal compressibility and flow on color Doppler imaging. Profunda Femoral Vein: No evidence of thrombus. Normal compressibility and flow on color Doppler imaging. Femoral Vein: No evidence of thrombus. Normal compressibility, respiratory phasicity and response to augmentation. Popliteal Vein: No evidence of thrombus. Normal compressibility, respiratory phasicity and response to augmentation. Calf Veins: No evidence of thrombus. Normal compressibility and flow on color Doppler imaging. Superficial Great Saphenous Vein: No evidence of thrombus. Normal compressibility. Venous Reflux:  None. Other Findings:  None. LEFT LOWER EXTREMITY Common Femoral Vein: No evidence of thrombus. Normal compressibility, respiratory phasicity and response to augmentation. Saphenofemoral Junction: No evidence of thrombus. Normal compressibility and flow on color Doppler imaging. Profunda Femoral Vein: No evidence of thrombus. Normal compressibility and flow on color Doppler imaging. Femoral Vein: No evidence of thrombus. Normal compressibility, respiratory phasicity and  response to augmentation. Popliteal Vein: No evidence of thrombus. Normal compressibility, respiratory phasicity and response to augmentation. Calf Veins: No evidence of thrombus. Normal compressibility and flow on color Doppler imaging. Superficial Great Saphenous Vein: No evidence of thrombus. Normal compressibility. Venous Reflux:  None. Other Findings: There is a right inguinal 4.2 x 1.6 x 4.5 cm centrally cystic and peripherally solid lesion of unclear etiology. No associated vasculature surrounding inflammation. IMPRESSION: 1. No evidence of deep venous thrombosis in either lower extremity. 2. A right inguinal 4.2 x 1.6 x 4.5 cm centrally cystic and peripherally solid lesion of unclear etiology. No associated vasculature surrounding inflammation. Electronically Signed   By: Morgane  Naveau M.D.   On: 06/13/2024 18:28   CT Head Wo Contrast Result Date: 06/13/2024 CLINICAL DATA:  falls, general weakness. EXAM: CT HEAD WITHOUT CONTRAST TECHNIQUE: Contiguous axial images were obtained from the base of the skull through the vertex without intravenous contrast. RADIATION DOSE REDUCTION: This exam was performed according to the departmental dose-optimization program which includes automated exposure control, adjustment of the mA and/or kV according to patient size and/or use of iterative reconstruction technique. COMPARISON:  CT scan head from 05/03/2024. FINDINGS: Brain: No evidence of acute infarction, hemorrhage, hydrocephalus, extra-axial collection or mass lesion/mass effect. There is bilateral periventricular hypodensity, which is non-specific but most likely seen in the settings of microvascular ischemic changes. Mild in extent. Otherwise normal appearance of brain parenchyma. Ventricles are normal. Cerebral volume is age appropriate. Vascular: No hyperdense vessel or unexpected calcification. Intracranial arteriosclerosis. Skull: Normal. Negative for fracture or focal lesion. Sinuses/Orbits: No acute  finding. Other: Visualized mastoid air cells are unremarkable. No mastoid effusion. IMPRESSION: No acute intracranial abnormality. Electronically Signed   By: Ree Molt M.D.   On: 06/13/2024 14:41   DG Chest 1 View Result Date: 06/13/2024 CLINICAL DATA:  sob, weakness. EXAM: CHEST  1 VIEW COMPARISON:  04/16/2024. FINDINGS: Redemonstration of biapical pleural-parenchymal disease as well as associated bronchiectasis/scarring without significant interval change. However, there are asymmetrically increased interstitial markings over the upper half of the left lung, which appears slightly more pronounced than the prior exam. Findings are nonspecific and can be due to underlying asymmetric pulmonary edema on the background of emphysema or chronic interstitial lung disease. Correlate clinically. Bilateral lung fields are otherwise clear. No dense consolidation or lung collapse. Bilateral costophrenic angles are clear. Stable cardio-mediastinal silhouette. No acute osseous abnormalities. Subacute healing fracture of posterior left ninth rib noted. The soft tissues are within normal limits. IMPRESSION: 1. Asymmetrically increased interstitial markings over the upper half of the left lung, which appears slightly more pronounced than the prior exam. Findings are nonspecific and can  be due to underlying asymmetric pulmonary edema on the background of emphysema or chronic interstitial lung disease. 2. Otherwise no acute cardiopulmonary abnormality. Electronically Signed   By: Ree Molt M.D.   On: 06/13/2024 14:38      Labs: BNP (last 3 results) Recent Labs    03/30/24 0510 04/16/24 0333 06/13/24 1257  BNP 62.3 50.9 52.0   Basic Metabolic Panel: Recent Labs  Lab 06/13/24 1257 06/14/24 0348 06/15/24 0436  NA 135 136 137  K 4.2 3.8 4.0  CL 88* 89* 98  CO2 36* 36* 33*  GLUCOSE 103* 112* 74  BUN 18 19 18   CREATININE 0.75 1.06 0.89  CALCIUM  8.8* 8.8* 8.5*  MG  --   --  2.6*   Liver Function  Tests: Recent Labs  Lab 06/13/24 1257  AST 47*  ALT 26  ALKPHOS 105  BILITOT 0.9  PROT 6.7  ALBUMIN  3.6   No results for input(s): LIPASE, AMYLASE in the last 168 hours. Recent Labs  Lab 06/15/24 1522  AMMONIA 22   CBC: Recent Labs  Lab 06/13/24 1257 06/14/24 0348  WBC 7.6 7.5  HGB 8.8* 8.6*  HCT 29.1* 28.9*  MCV 77.2* 78.1*  PLT 320 316   Cardiac Enzymes: Recent Labs  Lab 06/13/24 1257 06/14/24 0348 06/15/24 0436 06/16/24 1138  CKTOTAL 848* 866* 810* 308   BNP: Invalid input(s): POCBNP CBG: Recent Labs  Lab 06/15/24 0900  GLUCAP 92   D-Dimer No results for input(s): DDIMER in the last 72 hours. Hgb A1c No results for input(s): HGBA1C in the last 72 hours. Lipid Profile No results for input(s): CHOL, HDL, LDLCALC, TRIG, CHOLHDL, LDLDIRECT in the last 72 hours. Thyroid function studies No results for input(s): TSH, T4TOTAL, T3FREE, THYROIDAB in the last 72 hours.  Invalid input(s): FREET3 Anemia work up No results for input(s): VITAMINB12, FOLATE, FERRITIN, TIBC, IRON, RETICCTPCT in the last 72 hours. Urinalysis    Component Value Date/Time   COLORURINE YELLOW (A) 06/13/2024 1539   APPEARANCEUR CLEAR (A) 06/13/2024 1539   LABSPEC 1.010 06/13/2024 1539   PHURINE 7.0 06/13/2024 1539   GLUCOSEU NEGATIVE 06/13/2024 1539   HGBUR NEGATIVE 06/13/2024 1539   BILIRUBINUR NEGATIVE 06/13/2024 1539   KETONESUR NEGATIVE 06/13/2024 1539   PROTEINUR NEGATIVE 06/13/2024 1539   NITRITE NEGATIVE 06/13/2024 1539   LEUKOCYTESUR NEGATIVE 06/13/2024 1539   Sepsis Labs Recent Labs  Lab 06/13/24 1257 06/14/24 0348  WBC 7.6 7.5   Microbiology Recent Results (from the past 240 hours)  Resp panel by RT-PCR (RSV, Flu A&B, Covid) Anterior Nasal Swab     Status: None   Collection Time: 06/13/24  1:32 PM   Specimen: Anterior Nasal Swab  Result Value Ref Range Status   SARS Coronavirus 2 by RT PCR NEGATIVE NEGATIVE Final     Comment: (NOTE) SARS-CoV-2 target nucleic acids are NOT DETECTED.  The SARS-CoV-2 RNA is generally detectable in upper respiratory specimens during the acute phase of infection. The lowest concentration of SARS-CoV-2 viral copies this assay can detect is 138 copies/mL. A negative result does not preclude SARS-Cov-2 infection and should not be used as the sole basis for treatment or other patient management decisions. A negative result may occur with  improper specimen collection/handling, submission of specimen other than nasopharyngeal swab, presence of viral mutation(s) within the areas targeted by this assay, and inadequate number of viral copies(<138 copies/mL). A negative result must be combined with clinical observations, patient history, and epidemiological information. The expected result is Negative.  Fact Sheet for Patients:  BloggerCourse.com  Fact Sheet for Healthcare Providers:  SeriousBroker.it  This test is no t yet approved or cleared by the [place]  FDA and  has been authorized for detection and/or diagnosis of SARS-CoV-2 by FDA under an Emergency Use Authorization (EUA). This EUA will remain  in effect (meaning this test can be used) for the duration of the COVID-19 declaration under Section 564(b)(1) of the Act, 21 U.S.C.section 360bbb-3(b)(1), unless the authorization is terminated  or revoked sooner.       Influenza A by PCR NEGATIVE NEGATIVE Final   Influenza B by PCR NEGATIVE NEGATIVE Final    Comment: (NOTE) The Xpert Xpress SARS-CoV-2/FLU/RSV plus assay is intended as an aid in the diagnosis of influenza from Nasopharyngeal swab specimens and should not be used as a sole basis for treatment. Nasal washings and aspirates are unacceptable for Xpert Xpress SARS-CoV-2/FLU/RSV testing.  Fact Sheet for Patients: BloggerCourse.com  Fact Sheet for Healthcare  Providers: SeriousBroker.it  This test is not yet approved or cleared by the [place]  FDA and has been authorized for detection and/or diagnosis of SARS-CoV-2 by FDA under an Emergency Use Authorization (EUA). This EUA will remain in effect (meaning this test can be used) for the duration of the COVID-19 declaration under Section 564(b)(1) of the Act, 21 U.S.C. section 360bbb-3(b)(1), unless the authorization is terminated or revoked.     Resp Syncytial Virus by PCR NEGATIVE NEGATIVE Final    Comment: (NOTE) Fact Sheet for Patients: BloggerCourse.com  Fact Sheet for Healthcare Providers: SeriousBroker.it  This test is not yet approved or cleared by the [place]  FDA and has been authorized for detection and/or diagnosis of SARS-CoV-2 by FDA under an Emergency Use Authorization (EUA). This EUA will remain in effect (meaning this test can be used) for the duration of the COVID-19 declaration under Section 564(b)(1) of the Act, 21 U.S.C. section 360bbb-3(b)(1), unless the authorization is terminated or revoked.  Performed at [facility], [address] [address] [place], [place] [address]      Total time spend on discharging this patient, including the last patient exam, discussing the hospital stay, instructions for ongoing care as it relates to all pertinent caregivers, as well as preparing the medical discharge records, prescriptions, and/or referrals as applicable, is 35 minutes.    Ellouise Haber, MD  [facility] 06/17/2024, 9:34 AM

## 2024-06-17 NOTE — Plan of Care (Signed)

## 2024-06-19 ENCOUNTER — Telehealth: Payer: Self-pay | Admitting: Family

## 2024-06-19 ENCOUNTER — Telehealth: Payer: Self-pay

## 2024-06-19 NOTE — Patient Instructions (Signed)
 Visit Information  Thank you for taking time to visit with me today. Please don't hesitate to contact me if I can be of assistance to you before our next scheduled telephone appointment.  Our next appointment is by telephone on Monday October 20th at 11:00am  Following is a copy of your care plan:   Goals Addressed             This Visit's Progress    VBCI Transitions of Care (TOC) Care Plan       Problems:  Recent Hospitalization for treatment of Falls Knowledge Deficit Related to Current Health Status Hospital or ED Adm Risk is 94%  Goal:  Over the next 30 days, the patient will not experience hospital readmission  Interventions:   Falls Interventions: Reviewed medications and discussed potential side effects of medications such as dizziness and frequent urination Advised patient of importance of notifying provider of falls Assessed for falls since last encounter Assessed patients knowledge of fall risk prevention secondary to previously provided education Screening for signs and symptoms of depression related to chronic disease state  Assessed social determinant of health barriers Use assistive device as needed Clear pathways, remove obstacles, throw rugs HHPT and OT by [facility]  Patient Self Care Activities:  Attend all scheduled provider appointments Call pharmacy for medication refills 3-7 days in advance of running out of medications Call provider office for new concerns or questions  Notify RN Care Manager of [facility] call rescheduling needs Participate in Transition of Care Program/Attend [facility] scheduled calls Perform all self care activities independently  Take medications as prescribed    Plan:  Telephone follow up appointment with care management team member scheduled for:  Monday October 20th at 11:00am        The patient has been provided with contact information for the care management team and has been advised to call with any health related questions or  concerns.   Please call the care guide team at [phone] if you need to cancel or reschedule your appointment.   Please call the Suicide and Crisis Lifeline: 988 call the [place]  National Suicide Prevention Lifeline: 602-268-7347 or TTY: [phone] TTY 407 137 5566) to talk to a trained counselor if you are experiencing a Mental Health or Behavioral Health Crisis or need someone to talk to.  Medford Balboa, BSN, RN   VBCI - [facility] RN Care Manager [phone]

## 2024-06-19 NOTE — Telephone Encounter (Signed)
 Lonell, nurse with [facility], called for verbal orders for nursing 1w4 and verbals for okay to restart OT and PT.   Verbal okay given to Tenisha for the above orders.

## 2024-06-19 NOTE — Transitions of Care (Post Inpatient/ED Visit) (Signed)
 06/19/2024  Name: Kashawn Dirr MRN: [id number] DOB: 1946-05-01  Today's TOC FU Call Status: Today's TOC FU Call Status:: Successful TOC FU Call Completed TOC FU Call Complete Date: 06/19/24 Patient's Name and Date of Birth confirmed.  Transition Care Management Follow-up Telephone Call Date of Discharge: 06/17/24 Discharge Facility: [facility]) Type of Discharge: Inpatient Admission Primary Inpatient Discharge Diagnosis:: Fall How have you been since you were released from the hospital?: Better Any questions or concerns?: No  Items Reviewed: Did you receive and understand the discharge instructions provided?: No (The patient stated he did not receive a discharge summary) Medications obtained,verified, and reconciled?: Yes (Medications Reviewed) Any new allergies since your discharge?: No Dietary orders reviewed?: Yes Type of Diet Ordered:: Cardia Do you have support at home?: Yes People in Home [RPT]: child(ren), adult Name of Support/Comfort Primary Source: His sister [name]  Medications Reviewed Today: Medications Reviewed Today     Reviewed by Moises Reusing, RN (Case Manager) on 06/19/24 at 1222  Med List Status: <None>   Medication Order Taking? Sig Documenting Provider Last Dose Status Informant  acetaminophen  (TYLENOL ) 500 MG tablet [id number]  Take 1,000 mg by mouth every 6 (six) hours as needed for moderate pain or headache. [provider]  Active Pharmacy Records, Family Member  albuterol  (VENTOLIN  HFA) 108 [phone] Base) MCG/ACT inhaler [id number]  Inhale 1-2 puffs into the lungs every 4 (four) hours as needed for wheezing or shortness of breath. Lenon Marien CROME, MD  Active Pharmacy Records, Family Member  aspirin  81 MG tablet [id number]  Take 81 mg by mouth daily. [provider]  Active Pharmacy Records, Family Member           Med Note DAWN, [facility] R   Sat Dec 28, 2015  4:48 AM) .  atorvastatin  (LIPITOR) 40  MG tablet [id number]  Take 1 tablet (40 mg total) by mouth daily. [name], FNP  Active Pharmacy Records, Family Member  azelastine  (ASTELIN ) 0.1 % nasal spray [id number]  Place 1 spray into both nostrils 2 (two) times daily. [name], FNP  Active Pharmacy Records, Family Member  buPROPion  (WELLBUTRIN  SR) 150 MG 12 hr tablet [id number]  Take 1 tablet (150 mg total) by mouth 2 (two) times daily. Scoggins, Hospital doctor, NP  Active Pharmacy Records, Family Member  Cholecalciferol  (VITAMIN D3) 5000 UNITS TABS [id number]  Take 5,000 Units by mouth daily. [provider]  Active Pharmacy Records, Family Member  cyclobenzaprine  (FLEXERIL ) 5 MG tablet [id number]  TAKE ONE TABLET (5 MG TOTAL) BY MOUTH THREE TIMES DAILY AS NEEDED FOR MUSCLE SPASMS. [name], FNP  Active Pharmacy Records, Family Member  ferrous sulfate  [phone] FE) MG tablet [id number]  Take 1 tablet (325 mg total) by mouth daily with breakfast. [name], FNP  Active Pharmacy Records, Family Member  finasteride  (PROSCAR ) 5 MG tablet [id number]  Take 1 tablet (5 mg total) by mouth daily. [name], FNP  Active Pharmacy Records, Family Member  fluticasone  (FLONASE ) 50 MCG/ACT nasal spray [id number]  Place 2 sprays into both nostrils daily as needed for allergies.  [provider]  Active Pharmacy Records, Family Member           Med Note JERALYN, [place] A   Fri Nov 03, 2019  3:16 PM)    ipratropium-albuterol  (DUONEB) 0.5-2.5 (3) MG/3ML SOLN [id number]  Take 3 mLs by nebulization 4 (four) times daily. Tamea Dedra CROME, MD  Active Pharmacy Records, Family Member  losartan  (COZAAR ) 50 MG tablet [id number]  Take 1 tablet (50 mg total) by mouth daily. [name], FNP  Active Pharmacy Records, Family Member  nicotine  (NICODERM CQ  - DOSED IN MG/24 HOURS) 21 mg/24hr patch [id number]  PLACE ONE PATCH (21 MG TOTAL) ONTO THE SKIN DAILY.  Patient not taking: Reported on 06/19/2024   [name], FNP   Active   pantoprazole  (PROTONIX ) 40 MG tablet [id number]  Take 1 tablet (40 mg total) by mouth 2 (two) times daily before a meal.  Patient not taking: Reported on 06/19/2024   Tamea Dedra CROME, MD  Active Pharmacy Records, Family Member  rOPINIRole  (REQUIP ) 0.25 MG tablet [id number]  TAKE ONE TABLET (0.25 MG TOTAL) BY MOUTH THREE TIMES DAILY. [name], FNP  Active   tamsulosin  (FLOMAX ) 0.4 MG CAPS capsule [id number]  TAKE ONE CAPSULE (0.4 MG TOTAL) BY MOUTH DAILY. (NEEDS APPT FOR FUTURE FILL) Scoggins, Amber, NP  Active Pharmacy Records, Family Member            Home Care and Equipment/Supplies: Were Home Health Services Ordered?: Yes Name of Home Health Agency:: [facility] Has Agency set up a time to come to your home?: No EMR reviewed for Home Health Orders: Orders present/patient has not received call (refer to CM for follow-up) Any new equipment or medical supplies ordered?: No  Functional Questionnaire: Do you need assistance with bathing/showering or dressing?: No Do you need assistance with meal preparation?: No Do you need assistance with eating?: No Do you have difficulty maintaining continence: No Do you need assistance with getting out of bed/getting out of a chair/moving?: No Do you have difficulty managing or taking your medications?: No  Follow up appointments reviewed: PCP Follow-up appointment confirmed?: Yes Date of PCP follow-up appointment?: 06/27/24 Follow-up Provider: Alan Orlean Specialist [facility] Follow-up appointment confirmed?: No Reason Specialist Follow-Up Not Confirmed: Patient has Specialist Provider Number and will Call for Appointment Do you need transportation to your follow-up appointment?: No Do you understand care options if your condition(s) worsen?: Yes-patient verbalized understanding  SDOH Interventions Today    Flowsheet Row Most Recent Value  SDOH Interventions   Food Insecurity Interventions Intervention Not Indicated   Housing Interventions Intervention Not Indicated  Transportation Interventions Intervention Not Indicated  Utilities Interventions Intervention Not Indicated    Goals Addressed             This Visit's Progress    VBCI Transitions of Care (TOC) Care Plan       Problems:  Recent Hospitalization for treatment of Falls Knowledge Deficit Related to Current Health Status Hospital or ED Adm Risk is 94%  Goal:  Over the next 30 days, the patient will not experience hospital readmission  Interventions:   Falls Interventions: Reviewed medications and discussed potential side effects of medications such as dizziness and frequent urination Advised patient of importance of notifying provider of falls Assessed for falls since last encounter Assessed patients knowledge of fall risk prevention secondary to previously provided education Screening for signs and symptoms of depression related to chronic disease state  Assessed social determinant of health barriers Use assistive device as needed Clear pathways, remove obstacles, throw rugs HHPT and OT by [facility]  Patient Self Care Activities:  Attend all scheduled provider appointments Call pharmacy for medication refills 3-7 days in advance of running out of medications Call provider office for new concerns or questions  Notify RN Care Manager of TOC call rescheduling needs Participate in Transition of Care Program/Attend TOC scheduled  calls Perform all self care activities independently  Take medications as prescribed    Plan:  Telephone follow up appointment with care management team member scheduled for:  Monday October 20th at 11:00am        Medford Balboa, BSN, RN [place]  VBCI - [facility] RN Care Manager [phone]

## 2024-06-26 ENCOUNTER — Telehealth: Payer: Self-pay

## 2024-06-26 ENCOUNTER — Other Ambulatory Visit: Payer: Self-pay

## 2024-06-26 NOTE — Telephone Encounter (Signed)
 Home health called asking for the OT POC and is asking 1W x 4 W   [phone]

## 2024-06-26 NOTE — Transitions of Care (Post Inpatient/ED Visit) (Signed)
 Transition of Care week 2  Visit Note  06/26/2024  Name: Colton Nguyen MRN: [id number]          DOB: [birth year]  Situation: Patient enrolled in [facility] 30-day program. Visit completed with Arley Goldberg by telephone.   Background:   Initial Transition Care Management Follow-up Telephone Call Discharge Date and Diagnosis: 06/17/24, Fall   Past Medical History:  Diagnosis Date   Acute respiratory failure with hypoxia (HCC) 01/28/2023   Anxiety    situational   Arthritis    BPH (benign prostatic hyperplasia)    CAD (coronary artery disease)    CHF (congestive heart failure) (HCC)    pt denies 11/13/2019   Chronic cardiopulmonary disease (HCC)    COPD (chronic obstructive pulmonary disease) (HCC)    History of kidney stones    Hyperlipidemia    Hypertension    Hypertension 12/28/2016   lung ca dx'd 03/2015   Mass of lung    Nicotine  dependence    Personal history of kidney stones    Restless leg    Shortness of breath dyspnea    with exertion   Tuberculosis    Wears glasses     Assessment: Patient Reported Symptoms: Cognitive Cognitive Status: Alert and oriented to person, place, and time, Struggling with memory recall, Normal speech and language skills      Neurological Neurological Review of Symptoms: No symptoms reported, Other: [place] Neurological Symptoms/Conditions [RPT]: RLS Neurological Management Strategies: Medication therapy, Adequate rest, Routine screening  HEENT HEENT Symptoms Reported: No symptoms reported      Cardiovascular Cardiovascular Symptoms Reported: No symptoms reported Does patient have uncontrolled Hypertension?: No Cardiovascular Management Strategies: Medication therapy, Routine screening  Respiratory Respiratory Symptoms Reported: No symptoms reported Respiratory Management Strategies: Routine screening, Medication therapy, Activity  Endocrine Endocrine Symptoms Reported: No symptoms reported Is patient diabetic?: No     Gastrointestinal Gastrointestinal Symptoms Reported: No symptoms reported      Genitourinary Genitourinary Symptoms Reported: Difficulty initiating stream Genitourinary Management Strategies: Medication therapy  Integumentary Integumentary Symptoms Reported: No symptoms reported    Musculoskeletal Musculoskelatal Symptoms Reviewed: Unsteady gait, Weakness Musculoskeletal Management Strategies: Medication therapy, Activity, Routine screening, Medical device Musculoskeletal Comment: HHPT has started coming to the house. He states his strength and balance are improving   Patient at Risk for Falls Due to: History of fall(s), Impaired balance/gait, Impaired mobility Fall risk Follow up: Falls prevention discussed  Psychosocial Psychosocial Symptoms Reported: No symptoms reported         There were no vitals filed for this visit.  Medications Reviewed Today     Reviewed by Moises Reusing, RN (Case Manager) on 06/26/24 at 1103  Med List Status: <None>   Medication Order Taking? Sig Documenting Provider Last Dose Status Informant  acetaminophen  (TYLENOL ) 500 MG tablet [id number] No Take 1,000 mg by mouth every 6 (six) hours as needed for moderate pain or headache. [provider] Unknown Active Pharmacy Records, Family Member  albuterol  (VENTOLIN  HFA) 108 [phone] Base) MCG/ACT inhaler [id number] No Inhale 1-2 puffs into the lungs every 4 (four) hours as needed for wheezing or shortness of breath. Lenon Marien CROME, MD Unknown Active Pharmacy Records, Family Member  aspirin  81 MG tablet [id number] No Take 81 mg by mouth daily. [provider] 06/12/2024 Active Pharmacy Records, Family Member           Med Note DAWN, [facility] R   Sat Dec 28, 2015  4:48 AM) .  atorvastatin  (LIPITOR) 40 MG tablet [id number] No  Take 1 tablet (40 mg total) by mouth daily. [name], FNP 06/12/2024 Active Pharmacy Records, Family Member  azelastine  (ASTELIN ) 0.1 % nasal spray [id number] No Place  1 spray into both nostrils 2 (two) times daily. [name], FNP Unknown Active Pharmacy Records, Family Member  buPROPion  (WELLBUTRIN  SR) 150 MG 12 hr tablet [id number] No Take 1 tablet (150 mg total) by mouth 2 (two) times daily. Carin Gauze, NP 06/12/2024 Active Pharmacy Records, Family Member  Cholecalciferol  (VITAMIN D3) 5000 UNITS TABS [id number] No Take 5,000 Units by mouth daily. [provider] 06/12/2024 Active Pharmacy Records, Family Member  cyclobenzaprine  (FLEXERIL ) 5 MG tablet [id number] No TAKE ONE TABLET (5 MG TOTAL) BY MOUTH THREE TIMES DAILY AS NEEDED FOR MUSCLE SPASMS. [name], FNP Unknown Active Pharmacy Records, Family Member  ferrous sulfate  325 [phone] FE) MG tablet [id number] No Take 1 tablet (325 mg total) by mouth daily with breakfast. [name], FNP 06/12/2024 Active Pharmacy Records, Family Member  finasteride  (PROSCAR ) 5 MG tablet [id number] No Take 1 tablet (5 mg total) by mouth daily. [name], FNP Unknown Active Pharmacy Records, Family Member  fluticasone  (FLONASE ) 50 MCG/ACT nasal spray [id number] No Place 2 sprays into both nostrils daily as needed for allergies.  [provider] Unknown Active Pharmacy Records, Family Member           Med Note JERALYN, [place] A   Fri Nov 03, 2019  3:16 PM)    ipratropium-albuterol  (DUONEB) 0.5-2.5 (3) MG/3ML SOLN [id number] No Take 3 mLs by nebulization 4 (four) times daily. Tamea Dedra CROME, MD 06/12/2024 Active Pharmacy Records, Family Member  losartan  (COZAAR ) 50 MG tablet [id number] No Take 1 tablet (50 mg total) by mouth daily. [name], FNP 06/12/2024 Active Pharmacy Records, Family Member  nicotine  (NICODERM CQ  - DOSED IN MG/24 HOURS) 21 mg/24hr patch [id number]  PLACE ONE PATCH (21 MG TOTAL) ONTO THE SKIN DAILY.  Patient not taking: Reported on 06/19/2024   [name], FNP  Active   pantoprazole  (PROTONIX ) 40 MG tablet [id number]  Take 1 tablet (40 mg total) by mouth 2  (two) times daily before a meal.  Patient not taking: Reported on 06/19/2024   Tamea Dedra CROME, MD  Active Pharmacy Records, Family Member  rOPINIRole  (REQUIP ) 0.25 MG tablet [id number]  TAKE ONE TABLET (0.25 MG TOTAL) BY MOUTH THREE TIMES DAILY. [name], FNP  Active   tamsulosin  (FLOMAX ) 0.4 MG CAPS capsule [id number] No TAKE ONE CAPSULE (0.4 MG TOTAL) BY MOUTH DAILY. (NEEDS APPT FOR FUTURE FILL) Scoggins, Amber, NP 06/12/2024 Active Pharmacy Records, Family Member            Recommendation:   Continue Current Plan of Care  Follow Up Plan:   Telephone follow-up in 1 week  Medford Balboa, BSN, RN [place]  VBCI - [facility] RN Care Manager [phone]

## 2024-06-27 ENCOUNTER — Ambulatory Visit (INDEPENDENT_AMBULATORY_CARE_PROVIDER_SITE_OTHER): Admitting: Family

## 2024-06-27 VITALS — BP 154/62 | HR 99 | Ht 68.0 in

## 2024-06-27 DIAGNOSIS — J431 Panlobular emphysema: Secondary | ICD-10-CM | POA: Diagnosis not present

## 2024-06-27 DIAGNOSIS — J9611 Chronic respiratory failure with hypoxia: Secondary | ICD-10-CM | POA: Diagnosis not present

## 2024-06-27 DIAGNOSIS — G2581 Restless legs syndrome: Secondary | ICD-10-CM

## 2024-06-27 DIAGNOSIS — I10 Essential (primary) hypertension: Secondary | ICD-10-CM | POA: Diagnosis not present

## 2024-06-27 DIAGNOSIS — J479 Bronchiectasis, uncomplicated: Secondary | ICD-10-CM

## 2024-06-27 DIAGNOSIS — Z9981 Dependence on supplemental oxygen: Secondary | ICD-10-CM

## 2024-06-27 DIAGNOSIS — M7989 Other specified soft tissue disorders: Secondary | ICD-10-CM

## 2024-06-27 MED ORDER — AZELASTINE HCL 0.1 % NA SOLN: 1.0000 | Freq: Two times a day (BID) | NASAL | 3 refills | Status: AC

## 2024-06-27 MED ORDER — PRAMIPEXOLE DIHYDROCHLORIDE 0.125 MG PO TABS: 0.1250 mg | ORAL_TABLET | Freq: Every day | ORAL | 1 refills | Status: DC

## 2024-06-27 MED ORDER — FUROSEMIDE 40 MG PO TABS: 40.0000 mg | ORAL_TABLET | ORAL | 1 refills | Status: DC

## 2024-06-27 NOTE — Patient Instructions (Addendum)
 [facility] - respiratory Therapy.  Will call to discuss needs/set things up.   Sent RX for Furosemide , take MWF.  Let me know if still having swelling.   Refill sent for Azelastine .  Pramipexole - replaces Ropinirole  for restless leg.    This is starting dose, can increase to twice a day or three times a day if needed, just let me know.

## 2024-06-27 NOTE — Progress Notes (Unsigned)
 Established Patient Office Visit  Subjective:  Patient ID: Colton Nguyen, male    DOB: Jan 27, 1946  Age: 78 y.o. MRN: [id number]  Chief Complaint  Patient presents with   Follow-up    3 month follow up, swollen feet.    Patient is here today for follow up  He has been in and out of the hospital since his most recent visit with me, was also at a rehab facility for a short time.   He has stopped the Lasix  as his blood pressures were quite low, but he now is having increased swelling in his feet again.  He needs some form of fluid control as he has not been able to manage to get this cleared even with compression socks.   He also needs refills for a few of his medications.   His oxygen  level is quite low today, in reviewing medical records, he was noted to have bronchiectasis on CT scan around the time of his lung cancer diagnosis.       No other concerns at this time.   Past Medical History:  Diagnosis Date   Acute respiratory failure with hypoxia (HCC) 01/28/2023   Anxiety    situational   Arthritis    BPH (benign prostatic hyperplasia)    CAD (coronary artery disease)    CHF (congestive heart failure) (HCC)    pt denies 11/13/2019   Chronic cardiopulmonary disease (HCC)    COPD (chronic obstructive pulmonary disease) (HCC)    History of kidney stones    Hyperlipidemia    Hypertension    Hypertension 12/28/2016   lung ca dx'd 03/2015   Mass of lung    Nicotine  dependence    Personal history of kidney stones    Restless leg    Shortness of breath dyspnea    with exertion   Tuberculosis    Wears glasses     Past Surgical History:  Procedure Laterality Date   BACK SURGERY     BIOPSY  01/25/2020   Procedure: BIOPSY;  Surgeon: Legrand Victory LITTIE DOUGLAS, MD;  Location: [facility];  Service: Gastroenterology;;   CAD CCTA 01/31/15     CERVICAL FUSION     COLONOSCOPY     COLONOSCOPY WITH PROPOFOL  N/A 01/25/2020   Procedure: COLONOSCOPY WITH PROPOFOL ;  Surgeon: Legrand Victory LITTIE DOUGLAS, MD;  Location: [facility];  Service: Gastroenterology;  Laterality: N/A;   ESOPHAGOGASTRODUODENOSCOPY (EGD) WITH PROPOFOL  N/A 01/25/2020   Procedure: ESOPHAGOGASTRODUODENOSCOPY (EGD) WITH PROPOFOL ;  Surgeon: Legrand Victory LITTIE DOUGLAS, MD;  Location: [facility];  Service: Gastroenterology;  Laterality: N/A;   FRACTURE SURGERY     left ankle   HEMOSTASIS CLIP PLACEMENT  01/25/2020   Procedure: HEMOSTASIS CLIP PLACEMENT;  Surgeon: Legrand Victory LITTIE DOUGLAS, MD;  Location: [facility];  Service: Gastroenterology;;   HOT HEMOSTASIS N/A 01/25/2020   Procedure: HOT HEMOSTASIS (ARGON PLASMA COAGULATION/BICAP);  Surgeon: Legrand Victory LITTIE DOUGLAS, MD;  Location: [facility];  Service: Gastroenterology;  Laterality: N/A;   LUNG BIOPSY N/A 02/27/2015   Procedure: LUNG BIOPSY;  Surgeon: Dallas KATHEE Jude, MD;  Location: [facility];  Service: Thoracic;  Laterality: N/A;   right inguinal hernia repair at age 38     VIDEO BRONCHOSCOPY WITH ENDOBRONCHIAL ULTRASOUND N/A 02/27/2015   Procedure: VIDEO BRONCHOSCOPY WITH ENDOBRONCHIAL ULTRASOUND;  Surgeon: Dallas KATHEE Jude, MD;  Location: [facility];  Service: Thoracic;  Laterality: N/A;    Social History   Socioeconomic History   Marital status: Divorced  Spouse name: Not on file   Number of children: Not on file   Years of education: Not on file   Highest education level: Not on file  Occupational History   Not on file  Tobacco Use   Smoking status: Every Day    Current packs/day: 2.50    Average packs/day: 2.5 packs/day for 56.4 years (140.9 ttl pk-yrs)    Types: Cigarettes    Start date: 02/14/1968    Passive exposure: Current   Smokeless tobacco: Never  Vaping Use   Vaping status: Former  Substance and Sexual Activity   Alcohol use: Yes    Alcohol/week: 0.0 standard drinks of alcohol    Comment: ocassional   Drug use: No   Sexual activity: Yes  Other Topics Concern   Not on file  Social History Narrative   Not on file   Social Drivers of Health    Financial Resource Strain: Low Risk  (01/30/2023)   Overall Financial Resource Strain (CARDIA)    Difficulty of Paying Living Expenses: Not very hard  Food Insecurity: No Food Insecurity (06/19/2024)   Hunger Vital Sign    Worried About Running Out of Food in the Last Year: Never true    Ran Out of Food in the Last Year: Never true  Transportation Needs: No Transportation Needs (06/19/2024)   PRAPARE - Administrator, Civil Service (Medical): No    Lack of Transportation (Non-Medical): No  Physical Activity: Not on file  Stress: Not on file  Social Connections: Patient Declined (06/13/2024)   Social Connection and Isolation Panel    Frequency of Communication with Friends and Family: Patient declined    Frequency of Social Gatherings with Friends and Family: Patient declined    Attends Religious Services: Patient declined    Database administrator or Organizations: Patient declined    Attends Banker Meetings: Patient declined    Marital Status: Patient declined  Recent Concern: Social Connections - Socially Isolated (04/16/2024)   Social Connection and Isolation Panel    Frequency of Communication with Friends and Family: More than three times a week    Frequency of Social Gatherings with Friends and Family: Once a week    Attends Religious Services: Never    Database administrator or Organizations: No    Attends Banker Meetings: Never    Marital Status: Divorced  Catering manager Violence: Not At Risk (06/19/2024)   Humiliation, Afraid, Rape, and Kick questionnaire    Fear of Current or Ex-Partner: No    Emotionally Abused: No    Physically Abused: No    Sexually Abused: No    Family History  Problem Relation Age of Onset   Cancer Other    Heart disease Father    Lung cancer Father    Hypertension Other    Diabetes Mother    Valvular heart disease Mother    Diabetes Sister    Colon polyps Maternal Uncle    Diabetes Sister     Diabetes Sister    Diabetes Niece    Colon cancer Cousin    Esophageal cancer Neg Hx    Stomach cancer Neg Hx    Pancreatic cancer Neg Hx     No Known Allergies  Review of Systems  Respiratory:  Positive for cough, sputum production, shortness of breath and wheezing.   All other systems reviewed and are negative.      Objective:   BP (!) 154/62  Pulse 99   Ht 5' 8 (1.727 m)   SpO2 (!) 87%   BMI 21.13 kg/m   Vitals:   06/27/24 1308  BP: (!) 154/62  Pulse: 99  Height: 5' 8 (1.727 m)  SpO2: (!) 87%    Physical Exam Vitals and nursing note reviewed.  Constitutional:      Appearance: Normal appearance. He is normal weight.  Eyes:     Pupils: Pupils are equal, round, and reactive to light.  Cardiovascular:     Rate and Rhythm: Normal rate and regular rhythm.     Pulses: Normal pulses.     Heart sounds: Normal heart sounds.  Pulmonary:     Effort: Tachypnea and accessory muscle usage present.     Breath sounds: Examination of the right-middle field reveals rhonchi. Examination of the right-lower field reveals rhonchi. Examination of the left-lower field reveals rhonchi. Wheezing and rhonchi present.  Neurological:     General: No focal deficit present.     Mental Status: He is alert and oriented to person, place, and time. Mental status is at baseline.  Psychiatric:        Mood and Affect: Mood normal.        Behavior: Behavior normal.        Thought Content: Thought content normal.        Judgment: Judgment normal.      No results found for any visits on 06/27/24.  Recent Results (from the past 2160 hours)  Comprehensive metabolic panel     Status: Abnormal   Collection Time: 03/30/24  2:45 AM  Result Value Ref Range   Sodium 138 135 - 145 mmol/L   Potassium 4.3 3.5 - 5.1 mmol/L   Chloride 93 (L) 98 - 111 mmol/L   CO2 34 (H) 22 - 32 mmol/L   Glucose, Bld 95 70 - 99 mg/dL    Comment: Glucose reference range applies only to samples taken after fasting  for at least 8 hours.   BUN 17 8 - 23 mg/dL   Creatinine, Ser 9.10 0.61 - 1.24 mg/dL   Calcium  8.5 (L) 8.9 - 10.3 mg/dL   Total Protein 6.1 (L) 6.5 - 8.1 g/dL   Albumin  3.0 (L) 3.5 - 5.0 g/dL   AST 26 15 - 41 U/L   ALT 18 0 - 44 U/L   Alkaline Phosphatase 67 38 - 126 U/L   Total Bilirubin 0.6 0.0 - 1.2 mg/dL   GFR, Estimated >39 >39 mL/min    Comment: (NOTE) Calculated using the CKD-EPI Creatinine Equation (2021)    Anion gap 11 5 - 15    Comment: Performed at [facility], [address], [place], [place] [address]  Lactic acid, plasma     Status: None   Collection Time: 03/30/24  2:45 AM  Result Value Ref Range   Lactic Acid, Venous 0.9 0.5 - 1.9 mmol/L    Comment: Performed at [facility], [address], [place], [place] [address]  Troponin I (High Sensitivity)     Status: None   Collection Time: 03/30/24  2:45 AM  Result Value Ref Range   Troponin I (High Sensitivity) 8 <18 ng/L    Comment: (NOTE) Elevated high sensitivity troponin I (hsTnI) values and significant  changes across serial measurements may suggest ACS but many other  chronic and acute conditions are known to elevate hsTnI results.  Refer to the Links section for chest pain algorithms and additional  guidance. Performed at [facility], [address] [place], [place] [address]   TSH     Status: None   Collection Time: 03/30/24  2:45 AM  Result Value Ref Range   TSH 0.814 0.350 - 4.500 uIU/mL    Comment: Performed by a 3rd Generation assay with a functional sensitivity of <=0.01 uIU/mL. Performed at [facility], [address] [place], [place] [address]   Blood gas, venous     Status: Abnormal   Collection Time: 03/30/24  3:28 AM  Result Value Ref Range   pH, Ven 7.32 7.25 - 7.43   pCO2, Ven 94 (HH) 44 - 60 mmHg    Comment: CRITICAL RESULT CALLED TO, READ BACK BY AND VERIFIED WITH: DR. CLOTILDA MUMMA AT [id number] ON [id number] BE    Bicarbonate 48.4 (H) 20.0 - 28.0  mmol/L   Acid-Base Excess 17.3 (H) 0.0 - 2.0 mmol/L   O2 Saturation 37.9 %   Patient temperature 37.0    Collection site VEIN     Comment: Performed at [facility], [address] [place], [place] [address]  D-dimer, quantitative     Status: Abnormal   Collection Time: 03/30/24  3:28 AM  Result Value Ref Range   D-Dimer, Quant 1.43 (H) 0.00 - 0.50 ug/mL-FEU    Comment: (NOTE) At the manufacturer cut-off value of 0.5 g/mL FEU, this assay has a negative predictive value of 95-100%.This assay is intended for use in conjunction with a clinical pretest probability (PTP) assessment model to exclude pulmonary embolism (PE) and deep venous thrombosis (DVT) in outpatients suspected of PE or DVT. Results should be correlated with clinical presentation. Performed at [facility], [address] [place], [place] [address]   Protime-INR     Status: None   Collection Time: 03/30/24  3:28 AM  Result Value Ref Range   Prothrombin Time 14.6 11.4 - 15.2 seconds   INR 1.1 0.8 - 1.2    Comment: (NOTE) INR goal varies based on device and disease states. Performed at [facility], [address] [place], [place] [address]   Brain natriuretic peptide     Status: None   Collection Time: 03/30/24  5:10 AM  Result Value Ref Range   B Natriuretic Peptide 62.3 0.0 - 100.0 pg/mL    Comment: Performed at [facility], [address] [place], [place] [address]  CBC with Differential/Platelet     Status: Abnormal   Collection Time: 03/30/24  5:10 AM  Result Value Ref Range   WBC 7.6 4.0 - 10.5 K/uL   RBC 4.34 4.22 - 5.81 MIL/uL   Hemoglobin 10.4 (L) 13.0 - 17.0 g/dL   HCT 65.2 (L) 60.9 - 47.9 %   MCV 80.0 80.0 - 100.0 fL   MCH 24.0 (L) 26.0 - 34.0 pg   MCHC 30.0 30.0 - 36.0 g/dL   RDW 81.8 (H) 88.4 - 84.4 %   Platelets 215 150 - 400 K/uL   nRBC 0.0 0.0 - 0.2 %   Neutrophils Relative % 77 %   Neutro Abs 5.8 1.7 - 7.7 K/uL   Lymphocytes Relative 14 %    Lymphs Abs 1.1 0.7 - 4.0 K/uL   Monocytes Relative 8 %   Monocytes Absolute 0.6 0.1 - 1.0 K/uL   Eosinophils Relative 1 %   Eosinophils Absolute 0.1 0.0 - 0.5 K/uL   Basophils Relative 0 %   Basophils Absolute 0.0 0.0 - 0.1 K/uL   Immature Granulocytes 0 %   Abs Immature  Granulocytes 0.02 0.00 - 0.07 K/uL    Comment: Performed at [facility], [address] [place], [place] [address]  Blood culture (routine x 2)     Status: None   Collection Time: 03/30/24  7:04 AM   Specimen: BLOOD LEFT ARM  Result Value Ref Range   Specimen Description BLOOD LEFT ARM    Special Requests      BOTTLES DRAWN AEROBIC AND ANAEROBIC Blood Culture adequate volume   Culture      NO GROWTH 5 DAYS Performed at [facility], [address], [place], [place] [address]    Report Status 04/04/2024 FINAL   Blood gas, venous     Status: Abnormal   Collection Time: 03/30/24  7:58 AM  Result Value Ref Range   Delivery systems BILEVEL POSITIVE AIRWAY PRESSURE    pH, Ven 7.47 (H) 7.25 - 7.43   pCO2, Ven 58 44 - 60 mmHg   pO2, Ven 62 (H) 32 - 45 mmHg   Bicarbonate 42.2 (H) 20.0 - 28.0 mmol/L   Acid-Base Excess 15.6 (H) 0.0 - 2.0 mmol/L   O2 Saturation 94 %   Patient temperature 37.0    Collection site VENOUS     Comment: Performed at [facility], [address] [place], [place] [address]  Blood culture (routine x 2)     Status: None   Collection Time: 03/30/24 11:26 PM   Specimen: BLOOD  Result Value Ref Range   Specimen Description BLOOD BLOOD RIGHT ARM    Special Requests      BOTTLES DRAWN AEROBIC AND ANAEROBIC Blood Culture adequate volume   Culture      NO GROWTH 5 DAYS Performed at [facility], [address] [place], [place] [address]    Report Status 04/04/2024 FINAL   HIV Antibody (routine testing w rflx)     Status: None   Collection Time: 03/30/24 11:26 PM  Result Value Ref Range   HIV Screen 4th Generation wRfx Non Reactive Non Reactive     Comment: Performed at [facility], [address] [place], [place] [address]  Basic metabolic panel     Status: Abnormal   Collection Time: 03/31/24  4:23 AM  Result Value Ref Range   Sodium 140 135 - 145 mmol/L   Potassium 3.7 3.5 - 5.1 mmol/L   Chloride 91 (L) 98 - 111 mmol/L   CO2 40 (H) 22 - 32 mmol/L   Glucose, Bld 128 (H) 70 - 99 mg/dL    Comment: Glucose reference range applies only to samples taken after fasting for at least 8 hours.   BUN 21 8 - 23 mg/dL   Creatinine, Ser 9.07 0.61 - 1.24 mg/dL   Calcium  8.7 (L) 8.9 - 10.3 mg/dL   GFR, Estimated >39 >39 mL/min    Comment: (NOTE) Calculated using the CKD-EPI Creatinine Equation (2021)    Anion gap 9 5 - 15    Comment: Performed at [facility], [address] [place], [place] [address]  ECHOCARDIOGRAM COMPLETE     Status: None   Collection Time: 03/31/24  8:31 AM  Result Value Ref Range   Weight 2,186.96 oz   Height 68 in   BP 158/73 mmHg   S' Lateral 2.20 cm   Est EF 60 - 65%   Basic metabolic panel     Status: Abnormal   Collection Time: 04/01/24  2:28 AM  Result Value Ref Range   Sodium 137 135 -  145 mmol/L   Potassium 3.4 (L) 3.5 - 5.1 mmol/L   Chloride 90 (L) 98 - 111 mmol/L   CO2 39 (H) 22 - 32 mmol/L   Glucose, Bld 89 70 - 99 mg/dL    Comment: Glucose reference range applies only to samples taken after fasting for at least 8 hours.   BUN 33 (H) 8 - 23 mg/dL   Creatinine, Ser 9.04 0.61 - 1.24 mg/dL   Calcium  9.0 8.9 - 10.3 mg/dL   GFR, Estimated >39 >39 mL/min    Comment: (NOTE) Calculated using the CKD-EPI Creatinine Equation (2021)    Anion gap 8 5 - 15    Comment: Performed at [facility], [address] [place], [place] [address]  Magnesium      Status: None   Collection Time: 04/01/24  2:28 AM  Result Value Ref Range   Magnesium  1.9 1.7 - 2.4 mg/dL    Comment: Performed at [facility], [address] [place], [place] [address]  Phosphorus     Status: None    Collection Time: 04/01/24  2:28 AM  Result Value Ref Range   Phosphorus 2.7 2.5 - 4.6 mg/dL    Comment: Performed at [facility], [address] [place], [place] [address]  Urinalysis, w/ Reflex to Culture (Infection Suspected) -Urine, Clean Catch     Status: Abnormal   Collection Time: 04/01/24 11:36 AM  Result Value Ref Range   Specimen Source URINE, CLEAN CATCH    Color, Urine STRAW (A) YELLOW   APPearance CLEAR (A) CLEAR   Specific Gravity, Urine 1.006 1.005 - 1.030   pH 8.0 5.0 - 8.0   Glucose, UA NEGATIVE NEGATIVE mg/dL   Hgb urine dipstick NEGATIVE NEGATIVE   Bilirubin Urine NEGATIVE NEGATIVE   Ketones, ur NEGATIVE NEGATIVE mg/dL   Protein, ur NEGATIVE NEGATIVE mg/dL   Nitrite NEGATIVE NEGATIVE   Leukocytes,Ua NEGATIVE NEGATIVE   RBC / HPF 0-5 0 - 5 RBC/hpf   WBC, UA 0-5 0 - 5 WBC/hpf    Comment:        Reflex urine culture not performed if WBC <=10, OR if Squamous epithelial cells >5. If Squamous epithelial cells >5 suggest recollection.    Bacteria, UA NONE SEEN NONE SEEN   Squamous Epithelial / HPF 0-5 0 - 5 /HPF   Mucus PRESENT     Comment: Performed at [facility], [address] [place], [place] [address]  CBC with Differential/Platelet     Status: Abnormal   Collection Time: 04/02/24  8:44 AM  Result Value Ref Range   WBC 8.3 4.0 - 10.5 K/uL   RBC 4.22 4.22 - 5.81 MIL/uL   Hemoglobin 10.2 (L) 13.0 - 17.0 g/dL   HCT 67.8 (L) 60.9 - 47.9 %   MCV 76.1 (L) 80.0 - 100.0 fL   MCH 24.2 (L) 26.0 - 34.0 pg   MCHC 31.8 30.0 - 36.0 g/dL   RDW 81.6 (H) 88.4 - 84.4 %   Platelets 210 150 - 400 K/uL   nRBC 0.0 0.0 - 0.2 %   Neutrophils Relative % 78 %   Neutro Abs 6.5 1.7 - 7.7 K/uL   Lymphocytes Relative 13 %   Lymphs Abs 1.1 0.7 - 4.0 K/uL   Monocytes Relative 9 %   Monocytes Absolute 0.7 0.1 - 1.0 K/uL   Eosinophils Relative 0 %   Eosinophils Absolute 0.0 0.0 - 0.5 K/uL   Basophils Relative 0 %   Basophils Absolute 0.0  0.0 - 0.1 K/uL    Immature Granulocytes 0 %   Abs Immature Granulocytes 0.03 0.00 - 0.07 K/uL    Comment: Performed at [facility], [address] [place], [place] [address]  Comprehensive metabolic panel     Status: Abnormal   Collection Time: 04/02/24  8:44 AM  Result Value Ref Range   Sodium 137 135 - 145 mmol/L   Potassium 3.9 3.5 - 5.1 mmol/L   Chloride 95 (L) 98 - 111 mmol/L   CO2 35 (H) 22 - 32 mmol/L   Glucose, Bld 97 70 - 99 mg/dL    Comment: Glucose reference range applies only to samples taken after fasting for at least 8 hours.   BUN 32 (H) 8 - 23 mg/dL   Creatinine, Ser 9.01 0.61 - 1.24 mg/dL   Calcium  8.3 (L) 8.9 - 10.3 mg/dL   Total Protein 5.0 (L) 6.5 - 8.1 g/dL   Albumin  2.6 (L) 3.5 - 5.0 g/dL   AST 24 15 - 41 U/L   ALT 16 0 - 44 U/L   Alkaline Phosphatase 52 38 - 126 U/L   Total Bilirubin 0.4 0.0 - 1.2 mg/dL   GFR, Estimated >39 >39 mL/min    Comment: (NOTE) Calculated using the CKD-EPI Creatinine Equation (2021)    Anion gap 7 5 - 15    Comment: Performed at [facility], [address] [place], [place] [address]  Magnesium      Status: None   Collection Time: 04/03/24  3:47 AM  Result Value Ref Range   Magnesium  2.0 1.7 - 2.4 mg/dL    Comment: Performed at [facility], [address] [place], [place] [address]  Comprehensive metabolic panel with GFR     Status: Abnormal   Collection Time: 04/03/24  3:47 AM  Result Value Ref Range   Sodium 142 135 - 145 mmol/L   Potassium 3.8 3.5 - 5.1 mmol/L   Chloride 97 (L) 98 - 111 mmol/L   CO2 35 (H) 22 - 32 mmol/L   Glucose, Bld 99 70 - 99 mg/dL    Comment: Glucose reference range applies only to samples taken after fasting for at least 8 hours.   BUN 41 (H) 8 - 23 mg/dL   Creatinine, Ser 9.10 0.61 - 1.24 mg/dL   Calcium  8.6 (L) 8.9 - 10.3 mg/dL   Total Protein 5.1 (L) 6.5 - 8.1 g/dL   Albumin  2.6 (L) 3.5 - 5.0 g/dL   AST 18 15 - 41 U/L   ALT 14 0 - 44 U/L   Alkaline Phosphatase 49 38 - 126  U/L   Total Bilirubin 0.6 0.0 - 1.2 mg/dL   GFR, Estimated >39 >39 mL/min    Comment: (NOTE) Calculated using the CKD-EPI Creatinine Equation (2021)    Anion gap 10 5 - 15    Comment: Performed at [facility], [address] [place], [place] [address]  CBC with Differential/Platelet     Status: Abnormal   Collection Time: 04/03/24  3:47 AM  Result Value Ref Range   WBC 7.4 4.0 - 10.5 K/uL   RBC 4.14 (L) 4.22 - 5.81 MIL/uL   Hemoglobin 9.7 (L) 13.0 - 17.0 g/dL   HCT 68.1 (L) 60.9 - 47.9 %   MCV 76.8 (L) 80.0 - 100.0 fL   MCH 23.4 (L) 26.0 - 34.0 pg   MCHC 30.5 30.0 - 36.0 g/dL   RDW 81.5 (H) 88.4 - 84.4 %   Platelets 210  150 - 400 K/uL   nRBC 0.0 0.0 - 0.2 %   Neutrophils Relative % 75 %   Neutro Abs 5.5 1.7 - 7.7 K/uL   Lymphocytes Relative 16 %   Lymphs Abs 1.2 0.7 - 4.0 K/uL   Monocytes Relative 9 %   Monocytes Absolute 0.7 0.1 - 1.0 K/uL   Eosinophils Relative 0 %   Eosinophils Absolute 0.0 0.0 - 0.5 K/uL   Basophils Relative 0 %   Basophils Absolute 0.0 0.0 - 0.1 K/uL   Immature Granulocytes 0 %   Abs Immature Granulocytes 0.02 0.00 - 0.07 K/uL    Comment: Performed at [facility], [address] [place], [place] [address]  Phosphorus     Status: None   Collection Time: 04/03/24  3:47 AM  Result Value Ref Range   Phosphorus 4.0 2.5 - 4.6 mg/dL    Comment: Performed at [facility], [address] [place], [place] [address]  Magnesium      Status: None   Collection Time: 04/04/24  4:27 AM  Result Value Ref Range   Magnesium  2.0 1.7 - 2.4 mg/dL    Comment: Performed at [facility], [address] [place], [place] [address]  Comprehensive metabolic panel with GFR     Status: Abnormal   Collection Time: 04/04/24  4:27 AM  Result Value Ref Range   Sodium 140 135 - 145 mmol/L   Potassium 3.7 3.5 - 5.1 mmol/L   Chloride 100 98 - 111 mmol/L   CO2 33 (H) 22 - 32 mmol/L   Glucose, Bld 95 70 - 99 mg/dL    Comment: Glucose  reference range applies only to samples taken after fasting for at least 8 hours.   BUN 39 (H) 8 - 23 mg/dL   Creatinine, Ser 9.16 0.61 - 1.24 mg/dL   Calcium  8.5 (L) 8.9 - 10.3 mg/dL   Total Protein 5.5 (L) 6.5 - 8.1 g/dL   Albumin  2.7 (L) 3.5 - 5.0 g/dL   AST 16 15 - 41 U/L   ALT 16 0 - 44 U/L   Alkaline Phosphatase 50 38 - 126 U/L   Total Bilirubin 0.5 0.0 - 1.2 mg/dL   GFR, Estimated >39 >39 mL/min    Comment: (NOTE) Calculated using the CKD-EPI Creatinine Equation (2021)    Anion gap 7 5 - 15    Comment: Performed at [facility], [address] [place], [place] [address]  CBC with Differential/Platelet     Status: Abnormal   Collection Time: 04/04/24  4:27 AM  Result Value Ref Range   WBC 7.4 4.0 - 10.5 K/uL   RBC 4.02 (L) 4.22 - 5.81 MIL/uL   Hemoglobin 9.6 (L) 13.0 - 17.0 g/dL   HCT 69.3 (L) 60.9 - 47.9 %   MCV 76.1 (L) 80.0 - 100.0 fL   MCH 23.9 (L) 26.0 - 34.0 pg   MCHC 31.4 30.0 - 36.0 g/dL   RDW 81.5 (H) 88.4 - 84.4 %   Platelets 186 150 - 400 K/uL    Comment: REPEATED TO VERIFY   nRBC 0.0 0.0 - 0.2 %   Neutrophils Relative % 74 %   Neutro Abs 5.5 1.7 - 7.7 K/uL   Lymphocytes Relative 17 %   Lymphs Abs 1.2 0.7 - 4.0 K/uL   Monocytes Relative 9 %   Monocytes Absolute 0.7 0.1 - 1.0 K/uL   Eosinophils Relative 0 %   Eosinophils Absolute 0.0 0.0 - 0.5 K/uL  Basophils Relative 0 %   Basophils Absolute 0.0 0.0 - 0.1 K/uL   Immature Granulocytes 0 %   Abs Immature Granulocytes 0.03 0.00 - 0.07 K/uL    Comment: Performed at [facility], [address] [place], [place] [address]  Phosphorus     Status: None   Collection Time: 04/04/24  4:27 AM  Result Value Ref Range   Phosphorus 3.3 2.5 - 4.6 mg/dL    Comment: Performed at [facility], [address] [place], [place] [address]  Brain natriuretic peptide     Status: None   Collection Time: 04/16/24  3:33 AM  Result Value Ref Range   B Natriuretic Peptide 50.9 0.0 - 100.0 pg/mL     Comment: Performed at [facility], [address] [place], [place] [address]  Comprehensive metabolic panel     Status: Abnormal   Collection Time: 04/16/24  3:33 AM  Result Value Ref Range   Sodium 134 (L) 135 - 145 mmol/L   Potassium 3.7 3.5 - 5.1 mmol/L   Chloride 94 (L) 98 - 111 mmol/L   CO2 33 (H) 22 - 32 mmol/L   Glucose, Bld 117 (H) 70 - 99 mg/dL    Comment: Glucose reference range applies only to samples taken after fasting for at least 8 hours.   BUN 11 8 - 23 mg/dL   Creatinine, Ser 9.05 0.61 - 1.24 mg/dL   Calcium  8.5 (L) 8.9 - 10.3 mg/dL   Total Protein 6.8 6.5 - 8.1 g/dL   Albumin  3.5 3.5 - 5.0 g/dL   AST 20 15 - 41 U/L   ALT 18 0 - 44 U/L   Alkaline Phosphatase 102 38 - 126 U/L   Total Bilirubin 0.5 0.0 - 1.2 mg/dL   GFR, Estimated >39 >39 mL/min    Comment: (NOTE) Calculated using the CKD-EPI Creatinine Equation (2021)    Anion gap 7 5 - 15    Comment: Performed at [facility], [address] [place], [place] [address]  Lipase, blood     Status: None   Collection Time: 04/16/24  3:33 AM  Result Value Ref Range   Lipase 30 11 - 51 U/L    Comment: Performed at [facility], [address] [place], [place] [address]  Lactic acid, plasma     Status: None   Collection Time: 04/16/24  3:33 AM  Result Value Ref Range   Lactic Acid, Venous 0.8 0.5 - 1.9 mmol/L    Comment: Performed at [facility], [address], [place], [place] [address]  Troponin I (High Sensitivity)     Status: None   Collection Time: 04/16/24  3:33 AM  Result Value Ref Range   Troponin I (High Sensitivity) 6 <18 ng/L    Comment: (NOTE) Elevated high sensitivity troponin I (hsTnI) values and significant  changes across serial measurements may suggest ACS but many other  chronic and acute conditions are known to elevate hsTnI results.  Refer to the Links section for chest pain algorithms and additional  guidance. Performed at [facility], [address] [place], [place] [address]   Blood gas, venous     Status: Abnormal   Collection Time: 04/16/24  4:00 AM  Result Value Ref Range   pH, Ven 7.39 7.25 - 7.43   pCO2, Ven 68 (H) 44 - 60 mmHg   pO2, Ven 55 (H) 32 - 45 mmHg   Bicarbonate 41.2 (H) 20.0 - 28.0 mmol/L  Acid-Base Excess 13.1 (H) 0.0 - 2.0 mmol/L   O2 Saturation 84.7 %   Patient temperature 37.0    Collection site VEIN     Comment: Performed at [facility], [address] [place], [place] [address]  CBC with Differential/Platelet     Status: Abnormal   Collection Time: 04/16/24  4:33 AM  Result Value Ref Range   WBC 9.3 4.0 - 10.5 K/uL   RBC 4.29 4.22 - 5.81 MIL/uL   Hemoglobin 10.4 (L) 13.0 - 17.0 g/dL   HCT 65.6 (L) 60.9 - 47.9 %   MCV 80.0 80.0 - 100.0 fL   MCH 24.2 (L) 26.0 - 34.0 pg   MCHC 30.3 30.0 - 36.0 g/dL   RDW 81.5 (H) 88.4 - 84.4 %   Platelets 316 150 - 400 K/uL   nRBC 0.0 0.0 - 0.2 %   Neutrophils Relative % 88 %   Neutro Abs 8.1 (H) 1.7 - 7.7 K/uL   Lymphocytes Relative 9 %   Lymphs Abs 0.9 0.7 - 4.0 K/uL   Monocytes Relative 3 %   Monocytes Absolute 0.3 0.1 - 1.0 K/uL   Eosinophils Relative 0 %   Eosinophils Absolute 0.0 0.0 - 0.5 K/uL   Basophils Relative 0 %   Basophils Absolute 0.0 0.0 - 0.1 K/uL   Immature Granulocytes 0 %   Abs Immature Granulocytes 0.03 0.00 - 0.07 K/uL    Comment: Performed at [facility], [address] [place], [place] [address]  Blood Culture (routine x 2)     Status: None   Collection Time: 04/16/24  4:33 AM   Specimen: BLOOD  Result Value Ref Range   Specimen Description BLOOD BLOOD RIGHT ARM    Special Requests      BOTTLES DRAWN AEROBIC AND ANAEROBIC Blood Culture adequate volume   Culture      NO GROWTH 5 DAYS Performed at [facility], [address] [place], [place] [address]    Report Status 04/21/2024 FINAL   Blood Culture (routine x 2)     Status: None   Collection Time: 04/16/24  4:33 AM    Specimen: BLOOD  Result Value Ref Range   Specimen Description BLOOD BLOOD LEFT ARM    Special Requests      BOTTLES DRAWN AEROBIC AND ANAEROBIC Blood Culture adequate volume   Culture      NO GROWTH 5 DAYS Performed at [facility], [address] [place], [place] [address]    Report Status 04/21/2024 FINAL   Troponin I (High Sensitivity)     Status: None   Collection Time: 04/16/24  5:20 AM  Result Value Ref Range   Troponin I (High Sensitivity) 6 <18 ng/L    Comment: (NOTE) Elevated high sensitivity troponin I (hsTnI) values and significant  changes across serial measurements may suggest ACS but many other  chronic and acute conditions are known to elevate hsTnI results.  Refer to the Links section for chest pain algorithms and additional  guidance. Performed at [facility], [address] [place], [place] [address]   Procalcitonin     Status: None   Collection Time: 04/16/24  5:20 AM  Result Value Ref Range   Procalcitonin <0.10 ng/mL    Comment:        Interpretation: PCT (Procalcitonin) <= 0.5 ng/mL: Systemic infection (sepsis) is not likely. Local bacterial infection is possible. (NOTE)       Sepsis PCT Algorithm  Lower Respiratory Tract                                      Infection PCT Algorithm    ----------------------------     ----------------------------         PCT < 0.25 ng/mL                PCT < 0.10 ng/mL          Strongly encourage             Strongly discourage   discontinuation of antibiotics    initiation of antibiotics    ----------------------------     -----------------------------       PCT 0.25 - 0.50 ng/mL            PCT 0.10 - 0.25 ng/mL               OR       >80% decrease in PCT            Discourage initiation of                                            antibiotics      Encourage discontinuation           of antibiotics    ----------------------------     -----------------------------         PCT >=  0.50 ng/mL              PCT 0.26 - 0.50 ng/mL               AND        <80% decrease in PCT             Encourage initiation of                                             antibiotics       Encourage continuation           of antibiotics    ----------------------------     -----------------------------        PCT >= 0.50 ng/mL                  PCT > 0.50 ng/mL               AND         increase in PCT                  Strongly encourage                                      initiation of antibiotics    Strongly encourage escalation           of antibiotics                                     -----------------------------  PCT <= 0.25 ng/mL                                                 OR                                        > 80% decrease in PCT                                      Discontinue / Do not initiate                                             antibiotics  Performed at [facility], [address] [place], [place] [address]   CBC     Status: Abnormal   Collection Time: 04/18/24  4:42 AM  Result Value Ref Range   WBC 7.8 4.0 - 10.5 K/uL   RBC 3.92 (L) 4.22 - 5.81 MIL/uL   Hemoglobin 9.5 (L) 13.0 - 17.0 g/dL   HCT 69.5 (L) 60.9 - 47.9 %   MCV 77.6 (L) 80.0 - 100.0 fL   MCH 24.2 (L) 26.0 - 34.0 pg   MCHC 31.3 30.0 - 36.0 g/dL   RDW 81.7 (H) 88.4 - 84.4 %   Platelets 249 150 - 400 K/uL   nRBC 0.0 0.0 - 0.2 %    Comment: Performed at [facility], [address] [place], [place] [address]  Basic metabolic panel with GFR     Status: Abnormal   Collection Time: 04/18/24  4:42 AM  Result Value Ref Range   Sodium 138 135 - 145 mmol/L   Potassium 3.6 3.5 - 5.1 mmol/L   Chloride 97 (L) 98 - 111 mmol/L   CO2 35 (H) 22 - 32 mmol/L   Glucose, Bld 82 70 - 99 mg/dL    Comment: Glucose reference range applies only to samples taken after fasting for at least 8 hours.   BUN 15 8 - 23 mg/dL   Creatinine, Ser 9.17  0.61 - 1.24 mg/dL   Calcium  8.6 (L) 8.9 - 10.3 mg/dL   GFR, Estimated >39 >39 mL/min    Comment: (NOTE) Calculated using the CKD-EPI Creatinine Equation (2021)    Anion gap 6 5 - 15    Comment: Performed at [facility], [address] [place], [place] [address]  Glucose, capillary     Status: None   Collection Time: 04/19/24  7:57 AM  Result Value Ref Range   Glucose-Capillary 81 70 - 99 mg/dL    Comment: Glucose reference range applies only to samples taken after fasting for at least 8 hours.  Comprehensive metabolic panel     Status: Abnormal   Collection Time: 06/13/24 12:57 PM  Result Value Ref Range   Sodium 135 135 - 145 mmol/L   Potassium 4.2 3.5 - 5.1 mmol/L    Comment: HEMOLYSIS AT THIS LEVEL MAY AFFECT RESULT   Chloride 88 (L) 98 - 111 mmol/L   CO2 36 (H) 22 - 32 mmol/L   Glucose, Bld 103 (  H) 70 - 99 mg/dL    Comment: Glucose reference range applies only to samples taken after fasting for at least 8 hours.   BUN 18 8 - 23 mg/dL   Creatinine, Ser 9.24 0.61 - 1.24 mg/dL   Calcium  8.8 (L) 8.9 - 10.3 mg/dL   Total Protein 6.7 6.5 - 8.1 g/dL   Albumin  3.6 3.5 - 5.0 g/dL   AST 47 (H) 15 - 41 U/L    Comment: HEMOLYSIS AT THIS LEVEL MAY AFFECT RESULT   ALT 26 0 - 44 U/L    Comment: HEMOLYSIS AT THIS LEVEL MAY AFFECT RESULT   Alkaline Phosphatase 105 38 - 126 U/L   Total Bilirubin 0.9 0.0 - 1.2 mg/dL    Comment: HEMOLYSIS AT THIS LEVEL MAY AFFECT RESULT   GFR, Estimated >60 >60 mL/min    Comment: (NOTE) Calculated using the CKD-EPI Creatinine Equation (2021)    Anion gap 11 5 - 15    Comment: Performed at [facility], [address] [place], [place] [address]  CBC     Status: Abnormal   Collection Time: 06/13/24 12:57 PM  Result Value Ref Range   WBC 7.6 4.0 - 10.5 K/uL   RBC 3.77 (L) 4.22 - 5.81 MIL/uL   Hemoglobin 8.8 (L) 13.0 - 17.0 g/dL    Comment: Reticulocyte Hemoglobin testing may be clinically indicated, consider ordering this  additional test [id number]    HCT 29.1 (L) 39.0 - 52.0 %   MCV 77.2 (L) 80.0 - 100.0 fL   MCH 23.3 (L) 26.0 - 34.0 pg   MCHC 30.2 30.0 - 36.0 g/dL   RDW 83.9 (H) 88.4 - 84.4 %   Platelets 320 150 - 400 K/uL   nRBC 0.0 0.0 - 0.2 %    Comment: Performed at [facility], [address] [place], [place] [address]  Brain natriuretic peptide     Status: None   Collection Time: 06/13/24 12:57 PM  Result Value Ref Range   B Natriuretic Peptide 52.0 0.0 - 100.0 pg/mL    Comment: Performed at [facility], [address] [place], [place] [address]  Procalcitonin     Status: None   Collection Time: 06/13/24 12:57 PM  Result Value Ref Range   Procalcitonin <0.10 ng/mL    Comment:        Interpretation: PCT (Procalcitonin) <= 0.5 ng/mL: Systemic infection (sepsis) is not likely. Local bacterial infection is possible. (NOTE)       Sepsis PCT Algorithm           Lower Respiratory Tract                                      Infection PCT Algorithm    ----------------------------     ----------------------------         PCT < 0.25 ng/mL                PCT < 0.10 ng/mL          Strongly encourage             Strongly discourage   discontinuation of antibiotics    initiation of antibiotics    ----------------------------     -----------------------------       PCT 0.25 - 0.50 ng/mL            PCT 0.10 - 0.25 ng/mL  OR       >80% decrease in PCT            Discourage initiation of                                            antibiotics      Encourage discontinuation           of antibiotics    ----------------------------     -----------------------------         PCT >= 0.50 ng/mL              PCT 0.26 - 0.50 ng/mL               AND        <80% decrease in PCT             Encourage initiation of                                             antibiotics       Encourage continuation           of antibiotics    ----------------------------      -----------------------------        PCT >= 0.50 ng/mL                  PCT > 0.50 ng/mL               AND         increase in PCT                  Strongly encourage                                      initiation of antibiotics    Strongly encourage escalation           of antibiotics                                     -----------------------------                                           PCT <= 0.25 ng/mL                                                 OR                                        > 80% decrease in PCT                                      Discontinue / Do not initiate  antibiotics  Performed at [facility], [address] [place], [place] [address]   TSH     Status: None   Collection Time: 06/13/24 12:57 PM  Result Value Ref Range   TSH 0.351 0.350 - 4.500 uIU/mL    Comment: Performed by a 3rd Generation assay with a functional sensitivity of <=0.01 uIU/mL. Performed at [facility], [address] [place], [place] [address]   Iron and TIBC     Status: Abnormal   Collection Time: 06/13/24 12:57 PM  Result Value Ref Range   Iron 30 (L) 45 - 182 ug/dL    Comment: HEMOLYSIS AT THIS LEVEL MAY AFFECT RESULT   TIBC 371 250 - 450 ug/dL   Saturation Ratios 8 (L) 17.9 - 39.5 %   UIBC 341 ug/dL    Comment: Performed at [facility], [address], [place], [place] [address]  Ferritin     Status: None   Collection Time: 06/13/24 12:57 PM  Result Value Ref Range   Ferritin 48 24 - 336 ng/mL    Comment: Performed at [facility], [address] [place], [place] [address]  Reticulocytes     Status: Abnormal   Collection Time: 06/13/24 12:57 PM  Result Value Ref Range   Retic Ct Pct 2.2 0.4 - 3.1 %   RBC. 3.77 (L) 4.22 - 5.81 MIL/uL   Retic Count, Absolute 82.9 19.0 - 186.0 K/uL   Immature Retic Fract 26.1 (H) 2.3 - 15.9 %    Comment: Performed at [facility], [address] [place], [place] [address]  CK     Status: Abnormal   Collection Time: 06/13/24 12:57 PM  Result Value Ref Range   Total CK 848 (H) 49 - 397 U/L    Comment: HEMOLYSIS AT THIS LEVEL MAY AFFECT RESULT Performed at [facility], [address] [place], [place] [address]   Resp panel by RT-PCR (RSV, Flu A&B, Covid) Anterior Nasal Swab     Status: None   Collection Time: 06/13/24  1:32 PM   Specimen: Anterior Nasal Swab  Result Value Ref Range   SARS Coronavirus 2 by RT PCR NEGATIVE NEGATIVE    Comment: (NOTE) SARS-CoV-2 target nucleic acids are NOT DETECTED.  The SARS-CoV-2 RNA is generally detectable in upper respiratory specimens during the acute phase of infection. The lowest concentration of SARS-CoV-2 viral copies this assay can detect is 138 copies/mL. A negative result does not preclude SARS-Cov-2 infection and should not be used as the sole basis for treatment or other patient management decisions. A negative result may occur with  improper specimen collection/handling, submission of specimen other than nasopharyngeal swab, presence of viral mutation(s) within the areas targeted by this assay, and inadequate number of viral copies(<138 copies/mL). A negative result must be combined with clinical observations, patient history, and epidemiological information. The expected result is Negative.  Fact Sheet for Patients:  BloggerCourse.com  Fact Sheet for Healthcare Providers:  SeriousBroker.it  This test is no t yet approved or cleared by the [place]  FDA and  has been authorized for detection and/or diagnosis of SARS-CoV-2 by FDA under an Emergency Use Authorization (EUA). This EUA will remain  in effect (meaning this test can be used) for the duration of the COVID-19 declaration under Section 564(b)(1) of the Act, 21 U.S.C.section 360bbb-3(b)(1), unless the authorization is terminated  or revoked  sooner.       Influenza A by PCR NEGATIVE NEGATIVE   Influenza  B by PCR NEGATIVE NEGATIVE    Comment: (NOTE) The Xpert Xpress SARS-CoV-2/FLU/RSV plus assay is intended as an aid in the diagnosis of influenza from Nasopharyngeal swab specimens and should not be used as a sole basis for treatment. Nasal washings and aspirates are unacceptable for Xpert Xpress SARS-CoV-2/FLU/RSV testing.  Fact Sheet for Patients: BloggerCourse.com  Fact Sheet for Healthcare Providers: SeriousBroker.it  This test is not yet approved or cleared by the [place]  FDA and has been authorized for detection and/or diagnosis of SARS-CoV-2 by FDA under an Emergency Use Authorization (EUA). This EUA will remain in effect (meaning this test can be used) for the duration of the COVID-19 declaration under Section 564(b)(1) of the Act, 21 U.S.C. section 360bbb-3(b)(1), unless the authorization is terminated or revoked.     Resp Syncytial Virus by PCR NEGATIVE NEGATIVE    Comment: (NOTE) Fact Sheet for Patients: BloggerCourse.com  Fact Sheet for Healthcare Providers: SeriousBroker.it  This test is not yet approved or cleared by the [place]  FDA and has been authorized for detection and/or diagnosis of SARS-CoV-2 by FDA under an Emergency Use Authorization (EUA). This EUA will remain in effect (meaning this test can be used) for the duration of the COVID-19 declaration under Section 564(b)(1) of the Act, 21 U.S.C. section 360bbb-3(b)(1), unless the authorization is terminated or revoked.  Performed at [facility], [address] [place], [place] [address]   Blood gas, venous     Status: Abnormal   Collection Time: 06/13/24  2:47 PM  Result Value Ref Range   pH, Ven 7.38 7.25 - 7.43   pCO2, Ven 73 (HH) 44 - 60 mmHg    Comment: CRITICAL RESULT CALLED TO, READ BACK BY AND VERIFIED  WITH:  LOGAN RIGGINS RN @ 1456 ON 06/13/2024 RE    pO2, Ven 84 (H) 32 - 45 mmHg   Bicarbonate 43.2 (H) 20.0 - 28.0 mmol/L   Acid-Base Excess 14.5 (H) 0.0 - 2.0 mmol/L   O2 Saturation 98.2 %   Patient temperature 37.0    Collection site VEIN     Comment: Performed at [facility], [address] [place], [place] [address]  Urinalysis, Routine w reflex microscopic -Urine, Clean Catch     Status: Abnormal   Collection Time: 06/13/24  3:39 PM  Result Value Ref Range   Color, Urine YELLOW (A) YELLOW   APPearance CLEAR (A) CLEAR   Specific Gravity, Urine 1.010 1.005 - 1.030   pH 7.0 5.0 - 8.0   Glucose, UA NEGATIVE NEGATIVE mg/dL   Hgb urine dipstick NEGATIVE NEGATIVE   Bilirubin Urine NEGATIVE NEGATIVE   Ketones, ur NEGATIVE NEGATIVE mg/dL   Protein, ur NEGATIVE NEGATIVE mg/dL   Nitrite NEGATIVE NEGATIVE   Leukocytes,Ua NEGATIVE NEGATIVE    Comment: Performed at [facility], [address] [place], [place] [address]  Basic metabolic panel     Status: Abnormal   Collection Time: 06/14/24  3:48 AM  Result Value Ref Range   Sodium 136 135 - 145 mmol/L   Potassium 3.8 3.5 - 5.1 mmol/L   Chloride 89 (L) 98 - 111 mmol/L   CO2 36 (H) 22 - 32 mmol/L   Glucose, Bld 112 (H) 70 - 99 mg/dL    Comment: Glucose reference range applies only to samples taken after fasting for at least 8 hours.   BUN 19 8 - 23 mg/dL   Creatinine, Ser 8.93 0.61 - 1.24 mg/dL   Calcium  8.8 (L) 8.9 - 10.3  mg/dL   GFR, Estimated >39 >39 mL/min    Comment: (NOTE) Calculated using the CKD-EPI Creatinine Equation (2021)    Anion gap 11 5 - 15    Comment: Performed at [facility], [address] [place], [place] [address]  CBC     Status: Abnormal   Collection Time: 06/14/24  3:48 AM  Result Value Ref Range   WBC 7.5 4.0 - 10.5 K/uL   RBC 3.70 (L) 4.22 - 5.81 MIL/uL   Hemoglobin 8.6 (L) 13.0 - 17.0 g/dL   HCT 71.0 (L) 60.9 - 47.9 %   MCV 78.1 (L) 80.0 - 100.0 fL   MCH 23.2 (L)  26.0 - 34.0 pg   MCHC 29.8 (L) 30.0 - 36.0 g/dL   RDW 84.0 (H) 88.4 - 84.4 %   Platelets 316 150 - 400 K/uL   nRBC 0.0 0.0 - 0.2 %    Comment: Performed at [facility], [address] [place], [place] [address]  CK     Status: Abnormal   Collection Time: 06/14/24  3:48 AM  Result Value Ref Range   Total CK 866 (H) 49 - 397 U/L    Comment: Performed at [facility], [address] [place], [place] [address]  Basic metabolic panel with GFR     Status: Abnormal   Collection Time: 06/15/24  4:36 AM  Result Value Ref Range   Sodium 137 135 - 145 mmol/L   Potassium 4.0 3.5 - 5.1 mmol/L   Chloride 98 98 - 111 mmol/L   CO2 33 (H) 22 - 32 mmol/L   Glucose, Bld 74 70 - 99 mg/dL    Comment: Glucose reference range applies only to samples taken after fasting for at least 8 hours.   BUN 18 8 - 23 mg/dL   Creatinine, Ser 9.10 0.61 - 1.24 mg/dL   Calcium  8.5 (L) 8.9 - 10.3 mg/dL   GFR, Estimated >39 >39 mL/min    Comment: (NOTE) Calculated using the CKD-EPI Creatinine Equation (2021)    Anion gap 6 5 - 15    Comment: Performed at [facility], [address] [place], [place] [address]  Magnesium      Status: Abnormal   Collection Time: 06/15/24  4:36 AM  Result Value Ref Range   Magnesium  2.6 (H) 1.7 - 2.4 mg/dL    Comment: Performed at [facility], [address] [place], [place] [address]  CK     Status: Abnormal   Collection Time: 06/15/24  4:36 AM  Result Value Ref Range   Total CK 810 (H) 49 - 397 U/L    Comment: Performed at [facility], [address] [place], [place] [address]  Glucose, capillary     Status: None   Collection Time: 06/15/24  9:00 AM  Result Value Ref Range   Glucose-Capillary 92 70 - 99 mg/dL    Comment: Glucose reference range applies only to samples taken after fasting for at least 8 hours.  Ammonia     Status: None   Collection Time: 06/15/24  3:22 PM  Result Value Ref Range   Ammonia 22 9 - 35 umol/L     Comment: Performed at [facility], [address] [place], [place] [address]  CK     Status: None   Collection Time: 06/16/24 11:38 AM  Result Value Ref Range   Total CK 308 49 - 397 U/L    Comment: Performed at [facility], [address] [place], [place] [address]       Assessment & Plan Bronchiectasis without complication (HCC) Chronic respiratory failure with hypoxia, on home O2 therapy (HCC) Panlobular emphysema (HCC) Ordering OLE/HFCWO Vest therapy for Bronchiectasis confirmed by CT.  Patient has had a DAILY productive cough for more than 6 months and has tried PEP devices and therapy in the past, but failed to mobilize secretions. O2 increased in office today to 3 LPM, o2 sat improved to 96%.      Primary hypertension Blood pressure well controlled with current medications.  Continue current therapy.  Will reassess at follow up.   - CBC w/Diff - CMP w/eGFR  Swelling of both lower extremities Restarting Lasix , MWF only dosing.  Pt to monitor BP at home and hold if needed.   Restless leg syndrome Switch to Pramipexole for RLS, since Ropinirole  isn't helping.  May increase to 2-3 times daily if needed - call provider to inform if dose increase is needed.     Return in about 3 months (around 09/27/2024).   Total time spent: 30 minutes  ALAN CHRISTELLA ARRANT, FNP  06/27/2024   This document may have been prepared by [facility] Voice Recognition software and as such may include unintentional dictation errors.

## 2024-06-29 NOTE — Telephone Encounter (Signed)
 Verbal orders given via confidential voicemail for home health order

## 2024-06-30 ENCOUNTER — Encounter: Payer: Self-pay | Admitting: Family

## 2024-06-30 NOTE — Assessment & Plan Note (Addendum)
 Ordering OLE/HFCWO Vest therapy for Bronchiectasis confirmed by CT.  Patient has had a DAILY productive cough for more than 6 months and has tried PEP devices and therapy in the past, but failed to mobilize secretions. O2 increased in office today to 3 LPM, o2 sat improved to 96%.

## 2024-06-30 NOTE — Assessment & Plan Note (Signed)
 Blood pressure well controlled with current medications.  Continue current therapy.  Will reassess at follow up.   - CBC w/Diff - CMP w/eGFR

## 2024-06-30 NOTE — Assessment & Plan Note (Signed)
 Switch to Pramipexole for RLS, since Ropinirole  isn't helping.  May increase to 2-3 times daily if needed - call provider to inform if dose increase is needed.

## 2024-07-03 ENCOUNTER — Other Ambulatory Visit: Payer: Self-pay

## 2024-07-03 NOTE — Patient Instructions (Signed)
 Visit Information  Thank you for taking time to visit with me today. Please don't hesitate to contact me if I can be of assistance to you before our next scheduled telephone appointment.  Our next appointment is by telephone on Monday November 3rd at 3:30pm  Following is a copy of your care plan:   Goals Addressed             This Visit's Progress    VBCI Transitions of Care (TOC) Care Plan       Problems: (reviewed 07/03/24) Recent Hospitalization for treatment of Falls Knowledge Deficit Related to Current Health Status Hospital or ED Adm Risk is 94%  Goal: (reviewed 07/03/24) Over the next 30 days, the patient will not experience hospital readmission  Interventions: (reviewed 07/03/24)  Falls Interventions: Reviewed medications and discussed potential side effects of medications such as dizziness and frequent urination Advised patient of importance of notifying provider of falls Assessed for falls since last encounter Assessed patients knowledge of fall risk prevention secondary to previously provided education Screening for signs and symptoms of depression related to chronic disease state  Assessed social determinant of health barriers Use assistive device as needed Clear pathways, remove obstacles, throw rugs HHPT and OT by [facility] Take medication for RLS - 10/27 - Medication has been changed to Mirapex with better results  Patient Self Care Activities: (reviewed 07/03/24) Attend all scheduled provider appointments Call pharmacy for medication refills 3-7 days in advance of running out of medications Call provider office for new concerns or questions  Notify RN Care Manager of [facility] call rescheduling needs Participate in Transition of Care Program/Attend [facility] scheduled calls Perform all self care activities independently  Take medications as prescribed   Encouraged smoking cessation and/or reduction - The patient states he smokes between 2 and 2.5 packs a day  Plan:   Telephone follow up appointment with care management team member scheduled for:  Monday November 3rd at 3:30pm        The patient verbalized understanding of instructions, educational materials, and care plan provided today and agreed to receive a mailed copy of patient instructions, educational materials, and care plan.   The patient has been provided with contact information for the care management team and has been advised to call with any health related questions or concerns.   Please call the care guide team at [phone] if you need to cancel or reschedule your appointment.   Please call the Suicide and Crisis Lifeline: 988 call the [place]  National Suicide Prevention Lifeline: 606-188-7773 or TTY: 458-514-5564 TTY 402-789-0785) to talk to a trained counselor if you are experiencing a Mental Health or Behavioral Health Crisis or need someone to talk to.  Medford Balboa, BSN, RN [place]  VBCI - [employer] [facility] RN Care Manager [phone]

## 2024-07-03 NOTE — Transitions of Care (Post Inpatient/ED Visit) (Signed)
 Transition of Care week 3  Visit Note  07/03/2024  Name: Colton Nguyen MRN: [id number]          DOB: 11/10/1945  Situation: Patient enrolled in [facility] 30-day program. Visit completed with Arley Goldberg by telephone.   Background:   Initial Transition Care Management Follow-up Telephone Call Discharge Date and Diagnosis: 06/17/24, Fall   Past Medical History:  Diagnosis Date   Acute respiratory failure with hypoxia (HCC) 01/28/2023   Anxiety    situational   Arthritis    BPH (benign prostatic hyperplasia)    CAD (coronary artery disease)    CHF (congestive heart failure) (HCC)    pt denies 11/13/2019   Chronic cardiopulmonary disease (HCC)    COPD (chronic obstructive pulmonary disease) (HCC)    History of kidney stones    Hyperlipidemia    Hypertension    Hypertension 12/28/2016   lung ca dx'd 03/2015   Mass of lung    Nicotine  dependence    Personal history of kidney stones    Restless leg    Shortness of breath dyspnea    with exertion   Tuberculosis    Wears glasses     Assessment: Patient Reported Symptoms: Cognitive Cognitive Status: Alert and oriented to person, place, and time, Normal speech and language skills      Neurological Neurological Review of Symptoms: Other: [place] Neurological Symptoms/Conditions [RPT]: RLS Neurological Management Strategies: Medication therapy, Adequate rest, Routine screening  HEENT HEENT Symptoms Reported: No symptoms reported      Cardiovascular Cardiovascular Symptoms Reported: No symptoms reported Does patient have uncontrolled Hypertension?: No Cardiovascular Management Strategies: Medication therapy, Routine screening Cardiovascular Comment: The patient has some increased swelling in his feet last week but he states he doesn't have any today. He was restarted on Lasix  MWF  Respiratory Respiratory Symptoms Reported: No symptoms reported, Dry cough Additional Respiratory Details: The patient smokes between 2 and 2.5  ppd Respiratory Management Strategies: Medication therapy, Routine screening, Activity  Endocrine Endocrine Symptoms Reported: No symptoms reported Is patient diabetic?: No    Gastrointestinal Gastrointestinal Symptoms Reported: No symptoms reported      Genitourinary Genitourinary Symptoms Reported: No symptoms reported Genitourinary Management Strategies: Medication therapy  Integumentary Integumentary Symptoms Reported: No symptoms reported    Musculoskeletal Musculoskelatal Symptoms Reviewed: Unsteady gait, Weakness Musculoskeletal Management Strategies: Medication therapy, Activity, Routine screening, Medical device Musculoskeletal Comment: HHPT   Patient at Risk for Falls Due to: History of fall(s), Impaired balance/gait, Impaired mobility  Psychosocial Psychosocial Symptoms Reported: No symptoms reported         There were no vitals filed for this visit.  Medications Reviewed Today     Reviewed by Moises Reusing, RN (Case Manager) on 07/03/24 at 1642  Med List Status: <None>   Medication Order Taking? Sig Documenting Provider Last Dose Status Informant  acetaminophen  (TYLENOL ) 500 MG tablet [id number]  Take 1,000 mg by mouth every 6 (six) hours as needed for moderate pain or headache. [provider]  Active Pharmacy Records, Family Member  albuterol  (VENTOLIN  HFA) [phone] Base) MCG/ACT inhaler [id number]  Inhale 1-2 puffs into the lungs every 4 (four) hours as needed for wheezing or shortness of breath. Lenon Marien CROME, MD  Active Pharmacy Records, Family Member  aspirin  81 MG tablet [id number]  Take 81 mg by mouth daily. [provider]  Active Pharmacy Records, Family Member           Med Note DAWN, [facility] R   Sat Dec 28, 2015  4:48 AM) .  atorvastatin  (LIPITOR) 40 MG tablet [id number]  Take 1 tablet (40 mg total) by mouth daily.  Patient not taking: Reported on 06/27/2024   [name], FNP  Active Pharmacy Records, Family Member   azelastine  (ASTELIN ) 0.1 % nasal spray [id number]  Place 1 spray into both nostrils 2 (two) times daily. [name], FNP  Active   buPROPion  (WELLBUTRIN  SR) 150 MG 12 hr tablet [id number]  Take 1 tablet (150 mg total) by mouth 2 (two) times daily.  Patient not taking: Reported on 06/27/2024   Carin Gauze, NP  Active Pharmacy Records, Family Member  Cholecalciferol  (VITAMIN D3) 5000 UNITS TABS [id number]  Take 5,000 Units by mouth daily. [provider]  Active Pharmacy Records, Family Member  cyclobenzaprine  (FLEXERIL ) 5 MG tablet [id number]  TAKE ONE TABLET (5 MG TOTAL) BY MOUTH THREE TIMES DAILY AS NEEDED FOR MUSCLE SPASMS. [name], FNP  Active Pharmacy Records, Family Member  ferrous sulfate  [phone] FE) MG tablet [id number]  Take 1 tablet (325 mg total) by mouth daily with breakfast. [name], FNP  Active Pharmacy Records, Family Member  finasteride  (PROSCAR ) 5 MG tablet [id number]  Take 1 tablet (5 mg total) by mouth daily.  Patient not taking: Reported on 06/27/2024   [name], FNP  Active Pharmacy Records, Family Member  fluticasone  (FLONASE ) 50 MCG/ACT nasal spray [id number]  Place 2 sprays into both nostrils daily as needed for allergies.   Patient not taking: Reported on 06/27/2024   [provider]  Active Pharmacy Records, Family Member           Med Note JERALYN, [place] A   Fri Nov 03, 2019  3:16 PM)    furosemide  (LASIX ) 40 MG tablet [id number]  Take 1 tablet (40 mg total) by mouth every Monday, Wednesday, and Friday. [name], FNP  Active   ipratropium-albuterol  (DUONEB) 0.5-2.5 (3) MG/3ML SOLN [id number]  Take 3 mLs by nebulization 4 (four) times daily. Tamea Dedra CROME, MD  Active Pharmacy Records, Family Member  losartan  (COZAAR ) 50 MG tablet [id number]  Take 1 tablet (50 mg total) by mouth daily. [name], FNP  Active Pharmacy Records, Family Member  nicotine  (NICODERM CQ  - DOSED IN MG/24 HOURS) 21 mg/24hr patch  [id number]  PLACE ONE PATCH (21 MG TOTAL) ONTO THE SKIN DAILY.  Patient not taking: Reported on 06/27/2024   [name], FNP  Active   OXYGEN  [id number] Yes Inhale 2 L/min into the lungs continuous. [provider]  Active   pantoprazole  (PROTONIX ) 40 MG tablet [id number]  Take 1 tablet (40 mg total) by mouth 2 (two) times daily before a meal.  Patient not taking: Reported on 06/27/2024   Tamea Dedra CROME, MD  Active Pharmacy Records, Family Member  pramipexole (MIRAPEX) 0.125 MG tablet [id number]  Take 1 tablet (0.125 mg total) by mouth at bedtime. [name], FNP  Active   tamsulosin  (FLOMAX ) 0.4 MG CAPS capsule [id number]  TAKE ONE CAPSULE (0.4 MG TOTAL) BY MOUTH DAILY. (NEEDS APPT FOR FUTURE FILL) Scoggins, Amber, NP  Active Pharmacy Records, Family Member            Recommendation:   Continue Current Plan of Care  Follow Up Plan:   Telephone follow-up in 1 week  Medford Balboa, BSN, RN [place]  VBCI - Population Health RN Care Manager [phone]

## 2024-07-10 ENCOUNTER — Other Ambulatory Visit: Payer: Self-pay

## 2024-07-10 NOTE — Patient Instructions (Signed)
 Visit Information  Thank you for taking time to visit with me today. Please don't hesitate to contact me if I can be of assistance to you before our next scheduled telephone appointment.  Following is a copy of your care plan:   Goals Addressed             This Visit's Progress    COMPLETED: VBCI Transitions of Care (TOC) Care Plan       Problems: (reviewed 07/10/24) Recent Hospitalization for treatment of Falls Knowledge Deficit Related to Current Health Status Hospital or ED Adm Risk is 94%  Goal:  (reviewed 07/10/24) Over the next 30 days, the patient will not experience hospital readmission  Interventions:  (reviewed 07/10/24)  Falls Interventions: Reviewed medications and discussed potential side effects of medications such as dizziness and frequent urination Advised patient of importance of notifying provider of falls Assessed for falls since last encounter Assessed patients knowledge of fall risk prevention secondary to previously provided education Screening for signs and symptoms of depression related to chronic disease state  Assessed social determinant of health barriers Use assistive device as needed Clear pathways, remove obstacles, throw rugs HHPT and OT by [facility] Take medication for RLS - 10/27 - Medication has been changed to Mirapex with better results  Patient Self Care Activities:  (reviewed 07/10/24) Attend all scheduled provider appointments Call pharmacy for medication refills 3-7 days in advance of running out of medications Call provider office for new concerns or questions  Notify RN Care Manager of [facility] call rescheduling needs Participate in Transition of Care Program/Attend TOC scheduled calls Perform all self care activities independently  Take medications as prescribed   Encouraged smoking cessation and/or reduction - The patient states he smokes between 2 and 2.5 packs a day  Plan:  Telephone follow up appointment with care management team  member scheduled for:  Monday November 3rd at 3:30pm The patient has met his goals and is discharged from the [facility] Program        The patient verbalized understanding of instructions, educational materials, and care plan provided today and agreed to receive a mailed copy of patient instructions, educational materials, and care plan.   The patient has been provided with contact information for the care management team and has been advised to call with any health related questions or concerns.   Please call the care guide team at [phone] if you need to cancel or reschedule your appointment.   Please call the Suicide and Crisis Lifeline: 988 call the [place]  National Suicide Prevention Lifeline: (469) 771-6014 or TTY: 309-532-4016 TTY 409-840-7684) to talk to a trained counselor if you are experiencing a Mental Health or Behavioral Health Crisis or need someone to talk to.  Medford Balboa, BSN, RN [place]  VBCI - [employer] [facility] RN Care Manager [phone]

## 2024-07-13 ENCOUNTER — Other Ambulatory Visit: Payer: Self-pay | Admitting: Family

## 2024-07-19 ENCOUNTER — Telehealth: Payer: Self-pay | Admitting: Family

## 2024-07-19 ENCOUNTER — Telehealth: Payer: Self-pay

## 2024-07-19 NOTE — Telephone Encounter (Signed)
 Home health is requesting verbal orders for pt to get PT once a week for 8 week.

## 2024-07-19 NOTE — Telephone Encounter (Signed)
 Left VM requesting verbal orders for OT for 1w4, a phone call the following week, then 1w2.  Per Alan verbal, this will be fine.  Callback # [phone]

## 2024-07-24 ENCOUNTER — Telehealth: Payer: Self-pay | Admitting: Family

## 2024-07-24 NOTE — Telephone Encounter (Signed)
 Leita, PT with [facility], left VM requesting verbal orders for PT 1w8.  Per Henry schein, this is okay.  Callback # [phone]

## 2024-07-30 ENCOUNTER — Other Ambulatory Visit: Payer: Self-pay

## 2024-07-30 ENCOUNTER — Emergency Department

## 2024-07-30 ENCOUNTER — Inpatient Hospital Stay: Admission: EM | Admit: 2024-07-30 | Discharge: 2024-08-02 | DRG: 189 | Disposition: A | Discharge status: Home Health

## 2024-07-30 DIAGNOSIS — Z91148 Patient's other noncompliance with medication regimen for other reason: Secondary | ICD-10-CM

## 2024-07-30 DIAGNOSIS — N3942 Incontinence without sensory awareness: Secondary | ICD-10-CM | POA: Diagnosis present

## 2024-07-30 DIAGNOSIS — Z9221 Personal history of antineoplastic chemotherapy: Secondary | ICD-10-CM

## 2024-07-30 DIAGNOSIS — Z800 Family history of malignant neoplasm of digestive organs: Secondary | ICD-10-CM

## 2024-07-30 DIAGNOSIS — G8929 Other chronic pain: Secondary | ICD-10-CM | POA: Diagnosis present

## 2024-07-30 DIAGNOSIS — E785 Hyperlipidemia, unspecified: Secondary | ICD-10-CM | POA: Diagnosis present

## 2024-07-30 DIAGNOSIS — N401 Enlarged prostate with lower urinary tract symptoms: Secondary | ICD-10-CM | POA: Diagnosis present

## 2024-07-30 DIAGNOSIS — Z923 Personal history of irradiation: Secondary | ICD-10-CM

## 2024-07-30 DIAGNOSIS — Z833 Family history of diabetes mellitus: Secondary | ICD-10-CM

## 2024-07-30 DIAGNOSIS — Z9981 Dependence on supplemental oxygen: Secondary | ICD-10-CM

## 2024-07-30 DIAGNOSIS — R54 Age-related physical debility: Secondary | ICD-10-CM | POA: Diagnosis present

## 2024-07-30 DIAGNOSIS — J9622 Acute and chronic respiratory failure with hypercapnia: Secondary | ICD-10-CM | POA: Diagnosis present

## 2024-07-30 DIAGNOSIS — I5032 Chronic diastolic (congestive) heart failure: Secondary | ICD-10-CM | POA: Diagnosis present

## 2024-07-30 DIAGNOSIS — J441 Chronic obstructive pulmonary disease with (acute) exacerbation: Principal | ICD-10-CM | POA: Diagnosis present

## 2024-07-30 DIAGNOSIS — F17210 Nicotine dependence, cigarettes, uncomplicated: Secondary | ICD-10-CM | POA: Diagnosis present

## 2024-07-30 DIAGNOSIS — J9621 Acute and chronic respiratory failure with hypoxia: Principal | ICD-10-CM | POA: Diagnosis present

## 2024-07-30 DIAGNOSIS — I2510 Atherosclerotic heart disease of native coronary artery without angina pectoris: Secondary | ICD-10-CM | POA: Diagnosis present

## 2024-07-30 DIAGNOSIS — Z801 Family history of malignant neoplasm of trachea, bronchus and lung: Secondary | ICD-10-CM

## 2024-07-30 DIAGNOSIS — I110 Hypertensive heart disease with heart failure: Secondary | ICD-10-CM | POA: Diagnosis present

## 2024-07-30 DIAGNOSIS — F32A Depression, unspecified: Secondary | ICD-10-CM | POA: Diagnosis present

## 2024-07-30 DIAGNOSIS — M159 Polyosteoarthritis, unspecified: Secondary | ICD-10-CM | POA: Diagnosis present

## 2024-07-30 DIAGNOSIS — J9601 Acute respiratory failure with hypoxia: Secondary | ICD-10-CM | POA: Diagnosis present

## 2024-07-30 DIAGNOSIS — M4802 Spinal stenosis, cervical region: Secondary | ICD-10-CM | POA: Diagnosis present

## 2024-07-30 DIAGNOSIS — Z79899 Other long term (current) drug therapy: Secondary | ICD-10-CM

## 2024-07-30 DIAGNOSIS — Z7951 Long term (current) use of inhaled steroids: Secondary | ICD-10-CM

## 2024-07-30 DIAGNOSIS — Z8249 Family history of ischemic heart disease and other diseases of the circulatory system: Secondary | ICD-10-CM

## 2024-07-30 DIAGNOSIS — G2581 Restless legs syndrome: Secondary | ICD-10-CM | POA: Diagnosis present

## 2024-07-30 DIAGNOSIS — Z7982 Long term (current) use of aspirin: Secondary | ICD-10-CM

## 2024-07-30 DIAGNOSIS — I2489 Other forms of acute ischemic heart disease: Secondary | ICD-10-CM | POA: Diagnosis present

## 2024-07-30 DIAGNOSIS — Z85118 Personal history of other malignant neoplasm of bronchus and lung: Secondary | ICD-10-CM

## 2024-07-30 DIAGNOSIS — Z1152 Encounter for screening for COVID-19: Secondary | ICD-10-CM

## 2024-07-30 LAB — BLOOD GAS, VENOUS
Acid-Base Excess: 18.3 mmol/L — ABNORMAL HIGH (ref 0.0–2.0)
Bicarbonate: 50.7 mmol/L — ABNORMAL HIGH (ref 20.0–28.0)
O2 Saturation: 91.6 %
Patient temperature: 37
pCO2, Ven: 108 mmHg (ref 44–60)
pH, Ven: 7.28 (ref 7.25–7.43)
pO2, Ven: 61 mmHg — ABNORMAL HIGH (ref 32–45)

## 2024-07-30 LAB — CBC WITH DIFFERENTIAL/PLATELET
Abs Immature Granulocytes: 0.01 K/uL (ref 0.00–0.07)
Basophils Absolute: 0 K/uL (ref 0.0–0.1)
Basophils Relative: 0 %
Eosinophils Absolute: 0 K/uL (ref 0.0–0.5)
Eosinophils Relative: 1 %
HCT: 36.4 % — ABNORMAL LOW (ref 39.0–52.0)
Hemoglobin: 10.2 g/dL — ABNORMAL LOW (ref 13.0–17.0)
Immature Granulocytes: 0 %
Lymphocytes Relative: 18 %
Lymphs Abs: 1.3 K/uL (ref 0.7–4.0)
MCH: 23.4 pg — ABNORMAL LOW (ref 26.0–34.0)
MCHC: 28 g/dL — ABNORMAL LOW (ref 30.0–36.0)
MCV: 83.5 fL (ref 80.0–100.0)
Monocytes Absolute: 0.7 K/uL (ref 0.1–1.0)
Monocytes Relative: 9 %
Neutro Abs: 5.2 K/uL (ref 1.7–7.7)
Neutrophils Relative %: 72 %
Platelets: 265 K/uL (ref 150–400)
RBC: 4.36 MIL/uL (ref 4.22–5.81)
RDW: 17.8 % — ABNORMAL HIGH (ref 11.5–15.5)
WBC: 7.2 K/uL (ref 4.0–10.5)
nRBC: 0 % (ref 0.0–0.2)

## 2024-07-30 LAB — TROPONIN T, HIGH SENSITIVITY
Troponin T High Sensitivity: 74 ng/L — ABNORMAL HIGH (ref 0–19)
Troponin T High Sensitivity: 58 ng/L — ABNORMAL HIGH (ref 0–19)
Troponin T High Sensitivity: 65 ng/L — ABNORMAL HIGH (ref 0–19)
Troponin T High Sensitivity: 67 ng/L — ABNORMAL HIGH (ref 0–19)

## 2024-07-30 LAB — RESP PANEL BY RT-PCR (RSV, FLU A&B, COVID)  RVPGX2
Influenza A by PCR: NEGATIVE
Influenza B by PCR: NEGATIVE
Resp Syncytial Virus by PCR: NEGATIVE
SARS Coronavirus 2 by RT PCR: NEGATIVE

## 2024-07-30 LAB — COMPREHENSIVE METABOLIC PANEL WITH GFR
ALT: 12 U/L (ref 0–44)
AST: 20 U/L (ref 15–41)
Albumin: 3.8 g/dL (ref 3.5–5.0)
Alkaline Phosphatase: 109 U/L (ref 38–126)
Anion gap: 5 (ref 5–15)
BUN: 17 mg/dL (ref 8–23)
CO2: 43 mmol/L — ABNORMAL HIGH (ref 22–32)
Calcium: 9 mg/dL (ref 8.9–10.3)
Chloride: 95 mmol/L — ABNORMAL LOW (ref 98–111)
Creatinine, Ser: 0.7 mg/dL (ref 0.61–1.24)
GFR, Estimated: >60 mL/min (ref >60)
Glucose, Bld: 119 mg/dL — ABNORMAL HIGH (ref 70–99)
Potassium: 4.1 mmol/L (ref 3.5–5.1)
Sodium: 144 mmol/L (ref 135–145)
Total Bilirubin: 0.3 mg/dL (ref 0.0–1.2)
Total Protein: 6.3 g/dL — ABNORMAL LOW (ref 6.5–8.1)

## 2024-07-30 LAB — PRO BRAIN NATRIURETIC PEPTIDE: Pro Brain Natriuretic Peptide: 64 pg/mL (ref <300.0)

## 2024-07-30 MED ORDER — HYDRALAZINE HCL 20 MG/ML IJ SOLN: 10.0000 mg | Freq: Four times a day (QID) | INTRAMUSCULAR | Status: DC | PRN

## 2024-07-30 MED ORDER — METHYLPREDNISOLONE SODIUM SUCC 40 MG IJ SOLR
40.0000 mg | Freq: Two times a day (BID) | INTRAMUSCULAR | Status: DC
  Administered 2024-07-30 – 2024-07-31 (×2): 40 mg via INTRAVENOUS
  Filled 2024-07-30 (×2): qty 1

## 2024-07-30 MED ORDER — HYDROCODONE-ACETAMINOPHEN 5-325 MG PO TABS: 1.0000 | ORAL_TABLET | Freq: Four times a day (QID) | ORAL | Status: DC | PRN

## 2024-07-30 MED ORDER — FUROSEMIDE 40 MG PO TABS
40.0000 mg | ORAL_TABLET | ORAL | Status: DC
  Administered 2024-07-31 – 2024-08-02 (×2): 40 mg via ORAL
  Filled 2024-07-30 (×2): qty 1

## 2024-07-30 MED ORDER — GUAIFENESIN-DM 100-10 MG/5ML PO SYRP: 5.0000 mL | ORAL_SOLUTION | ORAL | Status: DC | PRN

## 2024-07-30 MED ORDER — IPRATROPIUM-ALBUTEROL 0.5-2.5 (3) MG/3ML IN SOLN
3.0000 mL | Freq: Once | RESPIRATORY_TRACT | Status: AC
  Administered 2024-07-30: 3 mL via RESPIRATORY_TRACT
  Filled 2024-07-30: qty 3

## 2024-07-30 MED ORDER — LOSARTAN POTASSIUM 50 MG PO TABS
50.0000 mg | ORAL_TABLET | Freq: Every day | ORAL | Status: DC
  Administered 2024-07-31 – 2024-08-02 (×3): 50 mg via ORAL
  Filled 2024-07-30 (×3): qty 1

## 2024-07-30 MED ORDER — MORPHINE SULFATE (PF) 2 MG/ML IV SOLN
2.0000 mg | INTRAVENOUS | Status: DC | PRN
  Administered 2024-07-30 – 2024-08-01 (×3): 2 mg via INTRAVENOUS
  Filled 2024-07-30 (×3): qty 1

## 2024-07-30 MED ORDER — NICOTINE 21 MG/24HR TD PT24
21.0000 mg | MEDICATED_PATCH | Freq: Every day | TRANSDERMAL | Status: DC
  Administered 2024-07-31 – 2024-08-02 (×3): 21 mg via TRANSDERMAL
  Filled 2024-07-30 (×3): qty 1

## 2024-07-30 MED ORDER — LABETALOL HCL 5 MG/ML IV SOLN
10.0000 mg | INTRAVENOUS | Status: DC | PRN
Start: 2024-07-30 — End: 2024-08-02

## 2024-07-30 MED ORDER — TAMSULOSIN HCL 0.4 MG PO CAPS
0.8000 mg | ORAL_CAPSULE | Freq: Every day | ORAL | Status: DC
  Administered 2024-07-31 – 2024-08-01 (×2): 0.8 mg via ORAL
  Filled 2024-07-30 (×2): qty 2

## 2024-07-30 MED ORDER — ATORVASTATIN CALCIUM 20 MG PO TABS
40.0000 mg | ORAL_TABLET | Freq: Every day | ORAL | Status: DC
  Administered 2024-07-31 – 2024-08-02 (×3): 40 mg via ORAL
  Filled 2024-07-30 (×3): qty 2

## 2024-07-30 MED ORDER — PRAMIPEXOLE DIHYDROCHLORIDE 0.25 MG PO TABS
0.1250 mg | ORAL_TABLET | Freq: Every day | ORAL | Status: DC
  Administered 2024-07-31 – 2024-08-01 (×2): 0.125 mg via ORAL
  Filled 2024-07-30 (×4): qty 0.5

## 2024-07-30 MED ORDER — METHYLPREDNISOLONE SODIUM SUCC 125 MG IJ SOLR
125.0000 mg | Freq: Once | INTRAMUSCULAR | Status: AC
  Administered 2024-07-30: 125 mg via INTRAVENOUS
  Filled 2024-07-30: qty 2

## 2024-07-30 MED ORDER — MUSCLE RUB 10-15 % EX CREA: 1.0000 | TOPICAL_CREAM | CUTANEOUS | Status: DC | PRN

## 2024-07-30 MED ORDER — SALINE SPRAY 0.65 % NA SOLN: 1.0000 | NASAL | Status: DC | PRN

## 2024-07-30 MED ORDER — HEPARIN SODIUM (PORCINE) 5000 UNIT/ML IJ SOLN
5000.0000 [IU] | Freq: Three times a day (TID) | INTRAMUSCULAR | Status: DC
  Administered 2024-07-30 – 2024-08-01 (×7): 5000 [IU] via SUBCUTANEOUS
  Filled 2024-07-30 (×7): qty 1

## 2024-07-30 MED ORDER — INFLUENZA VAC SPLIT HIGH-DOSE 0.5 ML IM SUSY: 0.5000 mL | PREFILLED_SYRINGE | INTRAMUSCULAR | Status: DC

## 2024-07-30 MED ORDER — POLYVINYL ALCOHOL 1.4 % OP SOLN: 1.0000 [drp] | OPHTHALMIC | Status: DC | PRN

## 2024-07-30 MED ORDER — ACETAMINOPHEN 325 MG PO TABS: 650.0000 mg | ORAL_TABLET | Freq: Four times a day (QID) | ORAL | Status: DC | PRN

## 2024-07-30 MED ORDER — ASPIRIN 81 MG PO TBEC
81.0000 mg | DELAYED_RELEASE_TABLET | Freq: Every day | ORAL | Status: DC
  Administered 2024-07-31 – 2024-08-02 (×3): 81 mg via ORAL
  Filled 2024-07-30 (×3): qty 1

## 2024-07-30 MED ORDER — IPRATROPIUM-ALBUTEROL 0.5-2.5 (3) MG/3ML IN SOLN
3.0000 mL | Freq: Four times a day (QID) | RESPIRATORY_TRACT | Status: DC
  Administered 2024-07-30 – 2024-07-31 (×3): 3 mL via RESPIRATORY_TRACT
  Filled 2024-07-30 (×3): qty 3

## 2024-07-30 MED ORDER — CARMEX CLASSIC LIP BALM EX OINT
1.0000 | TOPICAL_OINTMENT | CUTANEOUS | Status: DC | PRN
Start: 2024-07-30 — End: 2024-08-02

## 2024-07-30 NOTE — ED Triage Notes (Signed)
 Pt arrived from home via [facility] d/t fall and SOB. EMS reports that pt called d/t mechanical fall but when arriving on scene, pt was in resp distress. EMS reports that pt used nebulizer this morning but SOB didn't resolve. Alert and oriented to person, place, situation upon arrival. Disoriented to time upon arrival. Pt denies hitting head during fall and LOC.

## 2024-07-30 NOTE — ED Provider Notes (Addendum)
 [facility] Provider Note   Event Date/Time   First MD Initiated Contact with Patient 07/30/24 1346     (approximate) History  Fall and Shortness of Breath  HPI Colton Nguyen is a 78 y.o. male with a stated past medical history of COPD who presents via EMS after a mechanical fall today and EMS finding him in respiratory distress.  Patient states that he called because he was feeling weak and had a fall and was unable to get himself back up.  EMS noted him to be in respiratory distress upon their arrival with use of accessory muscles and wheezing over bilateral lung fields on exam.  Patient states that he uses nebulizer machine this morning however symptoms did not improve.  Patient denies any head trauma or loss of consciousness during the fall.  Patient is not on any anticoagulation ROS: Patient currently denies any vision changes, tinnitus, difficulty speaking, facial droop, sore throat, chest pain, abdominal pain, nausea/vomiting/diarrhea, dysuria, or weakness/numbness/paresthesias in any extremity   Physical Exam  Triage Vital Signs: ED Triage Vitals  Encounter Vitals Group     BP 07/30/24 1338 (!) 162/68     Girls Systolic BP Percentile --      Girls Diastolic BP Percentile --      Boys Systolic BP Percentile --      Boys Diastolic BP Percentile --      Pulse Rate 07/30/24 1338 95     Resp 07/30/24 1338 (!) 25     Temp 07/30/24 1338 97.8 F (36.6 C)     Temp Source 07/30/24 1338 Axillary     SpO2 07/30/24 1333 100 %     Weight 07/30/24 1341 135 lb (61.2 kg)     Height 07/30/24 1341 5' 8 (1.727 m)     Head Circumference --      Peak Flow --      Pain Score 07/30/24 1341 0     Pain Loc --      Pain Education --      Exclude from Growth Chart --    Most recent vital signs: Vitals:   07/30/24 1430 07/30/24 1500  BP: (!) 149/124 (!) 145/74  Pulse: 94 93  Resp: (!) 22 15  Temp:    SpO2: 95% 97%   General: Awake, oriented x4. CV:  Good  peripheral perfusion. Resp:  Increased effort.  Inspiratory and expiratory wheezing over bilateral lung fields.  Nonrebreather in place Abd:  No distention. Other:  Elderly well-developed, well-nourished African-American male resting on stretcher in mild respiratory distress ED Results / Procedures / Treatments  Labs (all labs ordered are listed, but only abnormal results are displayed) Labs Reviewed  COMPREHENSIVE METABOLIC PANEL WITH GFR - Abnormal; Notable for the following components:      Result Value   Chloride 95 (*)    CO2 43 (*)    Glucose, Bld 119 (*)    Total Protein 6.3 (*)    All other components within normal limits  CBC WITH DIFFERENTIAL/PLATELET - Abnormal; Notable for the following components:   Hemoglobin 10.2 (*)    HCT 36.4 (*)    MCH 23.4 (*)    MCHC 28.0 (*)    RDW 17.8 (*)    All other components within normal limits  BLOOD GAS, VENOUS - Abnormal; Notable for the following components:   pCO2, Ven 108 (*)    pO2, Ven 61 (*)    Bicarbonate 50.7 (*)    Acid-Base  Excess 18.3 (*)    All other components within normal limits  TROPONIN T, HIGH SENSITIVITY - Abnormal; Notable for the following components:   Troponin T High Sensitivity 74 (*)    All other components within normal limits  RESP PANEL BY RT-PCR (RSV, FLU A&B, COVID)  RVPGX2  PRO BRAIN NATRIURETIC PEPTIDE  TROPONIN T, HIGH SENSITIVITY   EKG ED ECG REPORT I, Artist MARLA Kerns, the attending physician, personally viewed and interpreted this ECG. Date: 07/30/2024 EKG Time: 1339 Rate: 96 Rhythm: normal sinus rhythm QRS Axis: normal Intervals: normal ST/T Wave abnormalities: normal Narrative Interpretation: no evidence of acute ischemia RADIOLOGY ED MD interpretation: Single view portable chest x-ray shows no acute cardiopulmonary process - All radiology independently interpreted and agree with radiology assessment Official radiology report(s): DG Chest Port 1 View Result Date: 07/30/2024 EXAM: 1  VIEW(S) XRAY OF THE CHEST 07/30/2024 02:09:35 PM COMPARISON: XR Chest 06/14/2024 and CT Chest 04/16/2024. CLINICAL HISTORY: SHOB SHOB FINDINGS: LUNGS AND PLEURA: Scarring and volume loss in the right lung is unchanged. Fluid or thickened pleura in the right apex is also stable. This corresponds to post-treatment changes as seen on prior CT chest 04/16/2024. Left lung is clear. No developing consolidation or airspace disease. No pleural effusions. No pneumothorax. HEART AND MEDIASTINUM: Normal heart size and pulmonary vascularity. No acute abnormality of the cardiac and mediastinal silhouettes. BONES AND SOFT TISSUES: Postoperative changes in the cervical spine. No acute osseous abnormality. IMPRESSION: 1. No acute cardiopulmonary process to account for shortness of breath. 2. Stable post-treatment changes in the right lung, including scarring, volume loss, and right apical pleural thickening/fluid, unchanged from prior studies. Electronically signed by: Elsie Gravely MD 07/30/2024 02:18 PM EST RP Workstation: [id number]   PROCEDURES: Critical Care performed: Yes, see critical care procedure note(s) Procedures MEDICATIONS ORDERED IN ED: Medications  methylPREDNISolone  sodium succinate (SOLU-MEDROL ) 125 mg/2 mL injection 125 mg (125 mg Intravenous Given 07/30/24 1358)  ipratropium-albuterol  (DUONEB) 0.5-2.5 (3) MG/3ML nebulizer solution 3 mL (3 mLs Nebulization Given 07/30/24 1357)   IMPRESSION / MDM / ASSESSMENT AND PLAN / ED COURSE  I reviewed the triage vital signs and the nursing notes.                             The patient is on the cardiac monitor to evaluate for evidence of arrhythmia and/or significant heart rate changes. Patient's presentation is most consistent with acute presentation with potential threat to life or bodily function. Patient 78 year old male with the above-stated past medical history presents for concerns for increased respiratory distress and somnolence DDx: COPD  exacerbation, hypercarbia, hypoxia, pneumonia, PE Plan: CBC, CMP, VBG, BNP, troponin, chest x-ray, EKG  Radiologic laboratory evaluation significant for elevated pCO2 of 100, stable anemia with hemoglobin of 10, increased troponin to 74 without any concordant chest pain or EKG changes.  Given patient's significant hypercarbia, will place patient on BiPAP with plan for admission to the internal medicine service for further evaluation and management.  Dispo: Admit to medicine   FINAL CLINICAL IMPRESSION(S) / ED DIAGNOSES   Final diagnoses:  COPD exacerbation (HCC)  Acute respiratory failure with hypoxia and hypercarbia (HCC)   Rx / DC Orders   ED Discharge Orders     None      Note:  This document was prepared using Dragon voice recognition software and may include unintentional dictation errors.   Colton Siedschlag K, MD 07/30/24 1530    Colton Labat  K, MD 07/30/24 [phone]

## 2024-07-30 NOTE — Plan of Care (Signed)

## 2024-07-30 NOTE — H&P (Signed)
 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   He presents today in acute respiratory distress.  Patient unable to provide history at the time my evaluation due to BiPAP and fatigue.  History garnered from his sister at bedside and chart review. Patient called EMS after a fall when he was reportedly unable to get back up, upon EMS arrival he was in respiratory distress, tripoding with use of accessory muscles and wheezing.  Denies any LOC or head trauma during the fall. In ED pCO2 found to be 108, patient very dyspneic and tachypneic on arrival.  He was placed on BiPAP and we were called for admission. CXR without evidence of volume overload or pneumonia. Reportedly patient did not experience any fever at home and has had no sick contacts.  His sister reports that he has had some weight gain over the last week and she suspects he is becoming volume overloaded.  She also reports that he has seemed to be slowing down over the last few days and she suspects he is developing pneumonia.  His sister reports that the patient does not consistently use his BiPAP at home.  She also reports that he still actively smoking and does not take his medications as prescribed.  He has been to a skilled nursing facility this year but no longer has benefits at this time.  He is currently residing with his son who also has disabilities and is unable to provide assistance.  The patient's sister and other family members rotate in his  home to provide assistance.  Review of Systems: As mentioned in the history of present illness. All other systems reviewed and are negative. Past Medical History:  Diagnosis Date   Acute respiratory failure with hypoxia (HCC) 01/28/2023   Anxiety    situational   Arthritis    BPH (benign prostatic hyperplasia)    CAD (coronary artery disease)    CHF (congestive heart failure) (HCC)    pt denies 11/13/2019   Chronic cardiopulmonary disease (HCC)    COPD (chronic obstructive pulmonary disease) (HCC)    History of kidney stones    Hyperlipidemia    Hypertension    Hypertension 12/28/2016   lung ca dx'd 03/2015   Mass of lung    Nicotine  dependence    Personal history of kidney stones    Restless leg    Shortness of breath dyspnea    with exertion   Tuberculosis    Wears glasses    Past Surgical History:  Procedure Laterality Date   BACK SURGERY     BIOPSY  01/25/2020   Procedure: BIOPSY;  Surgeon: Legrand Victory LITTIE DOUGLAS, MD;  Location: [facility];  Service: Gastroenterology;;   CAD CCTA 01/31/15     CERVICAL FUSION     COLONOSCOPY     COLONOSCOPY WITH PROPOFOL  N/A 01/25/2020   Procedure: COLONOSCOPY WITH PROPOFOL ;  Surgeon: Legrand Victory LITTIE DOUGLAS, MD;  Location: [facility];  Service: Gastroenterology;  Laterality: N/A;   ESOPHAGOGASTRODUODENOSCOPY (EGD) WITH PROPOFOL  N/A 01/25/2020  Procedure: ESOPHAGOGASTRODUODENOSCOPY (EGD) WITH PROPOFOL ;  Surgeon: Legrand Victory LITTIE DOUGLAS, MD;  Location: [facility];  Service: Gastroenterology;  Laterality: N/A;   FRACTURE SURGERY     left ankle   HEMOSTASIS CLIP PLACEMENT  01/25/2020   Procedure: HEMOSTASIS CLIP PLACEMENT;  Surgeon: Legrand Victory LITTIE DOUGLAS, MD;  Location: [facility];  Service: Gastroenterology;;   HOT HEMOSTASIS N/A 01/25/2020   Procedure: HOT HEMOSTASIS (ARGON PLASMA COAGULATION/BICAP);  Surgeon: Legrand Victory LITTIE DOUGLAS, MD;  Location: [facility];  Service: Gastroenterology;  Laterality: N/A;   LUNG BIOPSY N/A 02/27/2015   Procedure: LUNG  BIOPSY;  Surgeon: Dallas KATHEE Jude, MD;  Location: [facility] OR;  Service: Thoracic;  Laterality: N/A;   right inguinal hernia repair at age 35     VIDEO BRONCHOSCOPY WITH ENDOBRONCHIAL ULTRASOUND N/A 02/27/2015   Procedure: VIDEO BRONCHOSCOPY WITH ENDOBRONCHIAL ULTRASOUND;  Surgeon: Dallas KATHEE Jude, MD;  Location: [facility] OR;  Service: Thoracic;  Laterality: N/A;   Social History:  reports that he has been smoking cigarettes. He started smoking about 56 years ago. He has a 141.1 pack-year smoking history. He has been exposed to tobacco smoke. He has never used smokeless tobacco. He reports current alcohol  use. He reports that he does not use drugs.  No Known Allergies  Family History  Problem Relation Age of Onset   Cancer Other    Heart disease Father    Lung cancer Father    Hypertension Other    Diabetes Mother    Valvular heart disease Mother    Diabetes Sister    Colon polyps Maternal Uncle    Diabetes Sister    Diabetes Sister    Diabetes Niece    Colon cancer Cousin    Esophageal cancer Neg Hx    Stomach cancer Neg Hx    Pancreatic cancer Neg Hx     Prior to Admission medications   Medication Sig Start Date End Date Taking? Authorizing Provider  acetaminophen  (TYLENOL ) 500 MG tablet Take 1,000 mg by mouth every 6 (six) hours as needed for moderate pain or headache.    [provider]  albuterol  (VENTOLIN  HFA) 108 (90 Base) MCG/ACT inhaler Inhale 1-2 puffs into the lungs every 4 (four) hours as needed for wheezing or shortness of breath. 04/21/24   Lenon Marien LITTIE, MD  aspirin  81 MG tablet Take 81 mg by mouth daily.    [provider]  atorvastatin  (LIPITOR) 40 MG tablet Take 1 tablet (40 mg total) by mouth daily. Patient not taking: Reported on 06/27/2024 12/14/23   [name], FNP  azelastine  (ASTELIN ) 0.1 % nasal spray Place 1 spray into both nostrils 2 (two) times daily. 06/27/24   [name], FNP  buPROPion  (WELLBUTRIN  SR) 150 MG 12 hr tablet Take  1 tablet (150 mg total) by mouth 2 (two) times daily. Patient not taking: Reported on 06/27/2024 04/14/24 04/14/25  Scoggins, Amber, NP  Cholecalciferol  (VITAMIN D3) 5000 UNITS TABS Take 5,000 Units by mouth daily.    [provider]  cyclobenzaprine  (FLEXERIL ) 5 MG tablet TAKE ONE TABLET (5 MG TOTAL) BY MOUTH THREE TIMES DAILY AS NEEDED FOR MUSCLE SPASMS. 11/30/23   [name], FNP  ferrous sulfate  325 (65 FE) MG tablet Take 1 tablet (325 mg total) by mouth daily with breakfast. 12/14/23   [name], FNP  finasteride  (PROSCAR ) 5 MG tablet Take 1 tablet (5 mg total) by mouth daily. Patient not taking: Reported on 06/27/2024 12/14/23   [name], FNP  fluticasone  (FLONASE ) 50 MCG/ACT nasal spray Place 2 sprays into both nostrils daily as needed for allergies.  Patient not taking: Reported on 06/27/2024 04/02/15   [provider]  furosemide  (LASIX ) 40 MG tablet Take 1 tablet (40 mg total) by mouth every Monday, Wednesday, and Friday. 06/28/24   [name], FNP  ipratropium-albuterol  (DUONEB) 0.5-2.5 (3) MG/3ML SOLN Take 3 mLs by nebulization 4 (four) times daily. 05/22/24   Tamea Dedra CROME, MD  losartan  (COZAAR ) 50 MG tablet Take 1 tablet (50 mg total) by mouth daily. 06/09/24   [name], FNP  nicotine  (NICODERM CQ  - DOSED IN MG/24 HOURS) 21 mg/24hr patch PLACE ONE PATCH (21 MG TOTAL) ONTO THE SKIN DAILY. Patient not taking: Reported on 06/27/2024 06/15/24   [name], FNP  OXYGEN  Inhale 2 L/min into the lungs continuous.    [provider]  pantoprazole  (PROTONIX ) 40 MG tablet Take 1 tablet (40 mg total) by mouth 2 (two) times daily before a meal. Patient not taking: Reported on 06/27/2024 05/22/24   Tamea Dedra CROME, MD  pramipexole  (MIRAPEX ) 0.125 MG tablet Take 1 tablet (0.125 mg total) by mouth at bedtime. 06/27/24   [name], FNP  tamsulosin  (FLOMAX ) 0.4 MG CAPS capsule TAKE ONE CAPSULE (0.4 MG TOTAL) BY MOUTH DAILY.  (NEEDS APPT FOR FUTURE FILL) 05/05/24   Carin Gauze, NP    Physical Exam: Vitals:   07/30/24 1400 07/30/24 1430 07/30/24 1500 07/30/24 1530  BP: (!) 155/69 (!) 149/124 (!) 145/74 (!) 148/82  Pulse: 99 94 93 90  Resp: (!) 31 (!) 22 15 (!) 24  Temp:      TempSrc:      SpO2: 100% 95% 97% 97%  Weight:      Height:       Constitutional: Toxic appearing, frail HENT: Head Normocephalic and atraumatic.  Mucous membranes are moist.  Cardiovascular: Rate and Rhythm: Normal rate and regular rhythm.  Pulmonary: On BiPAP, tachypneic, poor aeration bilaterally, coarse airway Musculoskeletal:  Normal range of motion.  Skin: warm and dry. not jaundiced.  Neurological: Drowsy, requires significant prompting to answer questions.  Difficulty with speech coherency secondary to BiPAP  Data Reviewed:    Latest Ref Rng & Units 07/30/2024    1:53 PM 06/14/2024    3:48 AM 06/13/2024   12:57 PM  CBC  WBC 4.0 - 10.5 K/uL 7.2  7.5  7.6   Hemoglobin 13.0 - 17.0 g/dL 89.7  8.6  8.8   Hematocrit 39.0 - 52.0 % 36.4  28.9  29.1   Platelets 150 - 400 K/uL 265  316  320       Latest Ref Rng & Units 07/30/2024    1:53 PM 06/15/2024    4:36 AM 06/14/2024    3:48 AM  BMP  Glucose 70 - 99 mg/dL 880  74  887   BUN 8 - 23 mg/dL 17  18  19    Creatinine 0.61 - 1.24 mg/dL 9.29  9.10  8.93   Sodium 135 - 145 mmol/L 144  137  136   Potassium 3.5 - 5.1 mmol/L 4.1  4.0  3.8   Chloride 98 - 111 mmol/L 95  98  89   CO2 22 - 32 mmol/L 43  33  36   Calcium  8.9 - 10.3 mg/dL 9.0  8.5  8.8    DG Chest Port 1 View EXAM: 1 VIEW(S) XRAY OF THE CHEST 07/30/2024 02:09:35 PM  COMPARISON: XR Chest 06/14/2024 and  CT Chest 04/16/2024.  CLINICAL HISTORY: SHOB SHOB  FINDINGS:  LUNGS AND PLEURA: Scarring and volume loss in the right lung is unchanged. Fluid or thickened pleura in the right apex is also stable. This corresponds to post-treatment changes as seen on prior CT chest 04/16/2024. Left lung is clear. No  developing consolidation or airspace disease. No pleural effusions. No pneumothorax.  HEART AND MEDIASTINUM: Normal heart size and pulmonary vascularity. No acute abnormality of the cardiac and mediastinal silhouettes.  BONES AND SOFT TISSUES: Postoperative changes in the cervical spine. No acute osseous abnormality.  IMPRESSION: 1. No acute cardiopulmonary process to account for shortness of breath. 2. Stable post-treatment changes in the right lung, including scarring, volume loss, and right apical pleural thickening/fluid, unchanged from prior studies.  Electronically signed by: Elsie Gravely MD 07/30/2024 02:18 PM EST RP Workstation: [id number]   Assessment and Plan: Acute on chronic respiratory failure, with hypercapnia COPD exacerbation - Chronically on 3 L [place] - ABG on arrival was 7.28, pCO2 108, pO2 61, bicarb 50.7 - CXR without infiltrate or edema no indication for antibiotics at this time - Respiratory viral panel still pending - Status post Methylpred 125, will start prednisone  and taper as tolerated - Scheduled DuoNebs - Keep on BiPAP, wean off as tolerated - N.p.o. while on BiPAP-I-S and FV when he is able to participate  Generalized weakness Chronic low back pain Osteoarthritis involving multiple joints Cervical stenosis - Has already been to SNF this year, reportedly no longer has SNF days left - At baseline is meant to be using rolling walker with ambulation - Resides with his disabled son.  Family members rotate to help provide care - At minimum will need home health at DC - Will order PT/OT evals - Has outpatient follow-up with neurosurgery regarding cervical stenosis  Elevated troponin - High-sensitivity troponin 74, no repeat done in ED. - Will order repeat now - No chest pain presently.  Will likely be demand ischemia secondary to hypoxia  Muscular fasciculations - Known issue.  Evaluated on prior admission (DC date 10/11) - EEG prior to  admission WNL.  Per neurology symptoms were likely secondary to hypercapnia - Continue to treat with BiPAP as above  Hyperlipidemia - Continue home meds  Heart failure with preserved EF - Chronically on losartan  - Sister believes he has been gaining fluid weight lately.  No edema seen on CXR - Some peripheral edema in lower extremities - Takes Lasix  MWF at home, resume this dose. Does not appear volume overloaded  Restless leg syndrome - Continue pramipexole    Primary cancer right upper lobe of the lung - Prior stage IIIa non-small cell lung cancer. has been on observation since 2016 - Follows outpatient with oncology  Hypertension - Resume home medications   BPH Urinary incontinence without sensory awareness - Continue home dose Flomax   Depression - Patient reports no longer taking Wellbutrin   Tobacco abuse - Still smoking. Nicotine  patch.  Cessation counseling   Medication nonadherence - This complicates all of the above.  Patient has been counseled extensively on taking all medications as provided.  He has also been counseled on the importance of wearing BiPAP at night so he does not become hypercapnic each morning.  Sister continues to advocate for him but is finding it more difficult to care for him     Forgetfulness - Is having progressive decline, increasingly forgetful. 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  May be developing dementia, certainly worsened by persistent hypercapnia -  Has upcoming neurology appointment in January     Advance Care Planning: Code status discussion somewhat limited by patient's BiPAP however he is clear that he would like to be a full code.  His sister is at bedside for this discussion  Consults: n/a  Family Communication: Sister at bedside for discussion  Severity of Illness: The appropriate patient status for this patient is INPATIENT. Inpatient status is judged to be reasonable and necessary in order to  provide the required intensity of service to ensure the patient's safety. The patient's presenting symptoms, physical exam findings, and initial radiographic and laboratory data in the context of their chronic comorbidities is felt to place them at high risk for further clinical deterioration. Furthermore, it is not anticipated that the patient will be medically stable for discharge from the hospital within 2 midnights of admission.   * I certify that at the point of admission it is my clinical judgment that the patient will require inpatient hospital care spanning beyond 2 midnights from the point of admission due to high intensity of service, high risk for further deterioration and high frequency of surveillance required.*  Author: Danelia Snodgrass, DO 07/30/2024 3:36 PM  For on call review [email or web address].

## 2024-07-30 NOTE — Progress Notes (Signed)
 Switched to large mask on Bipap for pt's comfort.

## 2024-07-31 DIAGNOSIS — J9601 Acute respiratory failure with hypoxia: Secondary | ICD-10-CM

## 2024-07-31 LAB — COMPREHENSIVE METABOLIC PANEL WITH GFR
ALT: 9 U/L (ref 0–44)
AST: 15 U/L (ref 15–41)
Albumin: 3.5 g/dL (ref 3.5–5.0)
Alkaline Phosphatase: 95 U/L (ref 38–126)
Anion gap: 5 (ref 5–15)
BUN: 19 mg/dL (ref 8–23)
CO2: 41 mmol/L — ABNORMAL HIGH (ref 22–32)
Calcium: 9 mg/dL (ref 8.9–10.3)
Chloride: 97 mmol/L — ABNORMAL LOW (ref 98–111)
Creatinine, Ser: 0.7 mg/dL (ref 0.61–1.24)
GFR, Estimated: >60 mL/min (ref >60)
Glucose, Bld: 116 mg/dL — ABNORMAL HIGH (ref 70–99)
Potassium: 4.3 mmol/L (ref 3.5–5.1)
Sodium: 143 mmol/L (ref 135–145)
Total Bilirubin: <0.2 mg/dL (ref 0.0–1.2)
Total Protein: 5.8 g/dL — ABNORMAL LOW (ref 6.5–8.1)

## 2024-07-31 LAB — CBC
HCT: 30 % — ABNORMAL LOW (ref 39.0–52.0)
Hemoglobin: 8.8 g/dL — ABNORMAL LOW (ref 13.0–17.0)
MCH: 23.5 pg — ABNORMAL LOW (ref 26.0–34.0)
MCHC: 29.3 g/dL — ABNORMAL LOW (ref 30.0–36.0)
MCV: 80 fL (ref 80.0–100.0)
Platelets: 252 K/uL (ref 150–400)
RBC: 3.75 MIL/uL — ABNORMAL LOW (ref 4.22–5.81)
RDW: 18 % — ABNORMAL HIGH (ref 11.5–15.5)
WBC: 2.6 K/uL — ABNORMAL LOW (ref 4.0–10.5)
nRBC: 0 % (ref 0.0–0.2)

## 2024-07-31 MED ORDER — IPRATROPIUM-ALBUTEROL 0.5-2.5 (3) MG/3ML IN SOLN
3.0000 mL | Freq: Three times a day (TID) | RESPIRATORY_TRACT | Status: DC
  Administered 2024-07-31 – 2024-08-02 (×6): 3 mL via RESPIRATORY_TRACT
  Filled 2024-07-31 (×6): qty 3

## 2024-07-31 MED ORDER — PREDNISONE 20 MG PO TABS
40.0000 mg | ORAL_TABLET | Freq: Every day | ORAL | Status: DC
  Administered 2024-08-01 – 2024-08-02 (×2): 40 mg via ORAL
  Filled 2024-07-31 (×2): qty 2

## 2024-07-31 NOTE — Evaluation (Signed)
 Physical Therapy Evaluation Patient Details Name: Colton Nguyen MRN: [id number] DOB: June 26, 1946 Today's Date: 07/31/2024  History of Present Illness  per 06/16/24: Pt is a 78 y.o. 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 Per 07/30/24: He presents today in acute respiratory distress.  Patient unable to provide history at the time my evaluation due to BiPAP and fatigue.  History garnered from his sister at bedside and chart review.  Patient called EMS after a fall when he was reportedly unable to get back up, upon EMS arrival he was in respiratory distress, tripoding with use of accessory muscles and wheezing.  Denies any LOC or head trauma during the fall.  In ED pCO2 found to be 108, patient very dyspneic and tachypneic on arrival.  He was placed on BiPAP and we were called for admission.  CXR without evidence of volume overload or pneumonia.  Reportedly patient did not experience any fever at home and has had no sick contacts.  His sister reports that he has had some weight gain over the last week and she suspects he is becoming volume overloaded.  She also reports that he has seemed to be slowing down over the last few days and she suspects he is developing pneumonia.  Clinical Impression  Pt is a very pleasant 78 y.o. male admitted d/t acute hypoxic respiratory failure. He was received in supine and was alert and agreeable to PT evaluation. A&Ox2, disoriented to time and situation. Pt was able to mobilize to EOB without vc or physical assist and able to balance EOB with BUE or 1 UE if weight shifted to that side. Pt could not seem to recall his living situation  and home/PLOF details obtained from previous visits this year. Pt attempted step pivot transfer from bed to recliner using 1+ HHA. He required min A to stand but mod A for both physical assist and vc for sequencing steps, pivot, and hand placement to sit down in recliner. Pt O2 dropped to 89% with activity but recovered to 91 with PLB techniques on 3L. Pt left in recliner with all needs met and nursing in room. Pt will benefit from continued skilled PT services to address his decline in functional strength and mobility.         If plan is discharge home, recommend the following: A little help with walking and/or transfers   Can travel by private vehicle   Yes    Equipment Recommendations BSC/3in1  Recommendations for Other Services  OT consult    Functional Status Assessment Patient has had a recent decline in their functional status and demonstrates the ability to make significant improvements in function in a reasonable and predictable amount of time.     Precautions / Restrictions Precautions Precautions: Fall Recall of Precautions/Restrictions: Impaired Restrictions Weight Bearing Restrictions Per Provider Order: No      Mobility  Bed Mobility Overal bed mobility: Needs Assistance Bed Mobility: Supine to Sit     Supine to sit: Supervision     General bed mobility comments: able to mobilize to EOB without physical assist; can balance EOB with supervision    Transfers Overall transfer level: Needs assistance Equipment used: 1 person hand held assist  Transfers: Bed to chair/wheelchair/BSC             General transfer comment: required min A to stand, max multimodal cuing to take baby steps and pivot to recliner with 1+ HHA    Ambulation/Gait Ambulation/Gait assistance: Mod assist Gait Distance (Feet): 2 Feet Assistive device: 1 person hand held assist Gait Pattern/deviations: Step-to pattern, Trunk flexed, Narrow base of support, Shuffle       General Gait  Details: mod A and vc for sequencing steps and pivot to recliner. vc to reach for armrest rather than grabbing therapist shoulder  Stairs            Wheelchair Mobility     Tilt Bed    Modified Rankin (Stroke Patients Only)       Balance Overall balance assessment: Needs assistance Sitting-balance support: Bilateral upper extremity supported, Feet supported Sitting balance-Leahy Scale: Fair Sitting balance - Comments: able to balance EOB with supervision; can balance with 1 UE if weight shifted to that side   Standing balance support: Single extremity supported Standing balance-Leahy Scale: Poor Standing balance comment: 1 HHA for standing balance; would benefit to try transfer with RW                             Pertinent Vitals/Pain Pain Assessment Pain Assessment: No/denies pain    Home Living Family/patient expects to be discharged to:: Private residence Living Arrangements: Children Available Help at Discharge: Other (Comment) (from other encounter) Type of Home: House Home Access: Stairs to enter Entrance Stairs-Rails: Right;Left;Can reach both Entrance Stairs-Number of Steps: 3   Home Layout: One level Home Equipment: Agricultural Consultant (2 wheels);Cane - single point;Shower seat Additional Comments: Pt on 2Ls via [place] at home per PT note 04/18/24    Prior Function Prior Level of Function : Independent/Modified Independent             Mobility Comments: Pt reports using RW for amb outside of home, ADLs Comments: Unknown - pt to provide PLOF. Information taken from most recent Acute stay.     Extremity/Trunk Assessment   Upper Extremity Assessment Upper Extremity Assessment: Overall WFL for tasks assessed    Lower Extremity Assessment Lower Extremity Assessment: Generalized weakness    Cervical / Trunk Assessment Cervical / Trunk Assessment: Kyphotic  Communication   Communication Communication: Impaired Factors Affecting  Communication: Reduced clarity of speech;Difficulty expressing self    Cognition Arousal: Alert Behavior During Therapy: WFL for tasks assessed/performed   PT - Cognitive impairments: History of cognitive impairments, Orientation, Awareness, Memory, Initiation, Problem solving, Sequencing, No family/caregiver present to determine baseline   Orientation impairments: Time, Situation                   PT - Cognition Comments: A&Ox2 - not oriented to situation or time Following commands: Intact       Cueing Cueing Techniques: Verbal cues, Gestural cues, Tactile cues, Visual cues     General Comments General comments (skin integrity, edema, etc.): O2 drop to 89% with recovery to 91% with PLB on 3L; HR remained mid-high 90s throughout.    Exercises Other Exercises Other Exercises: edu on PLB during functional activity   Assessment/Plan    PT Assessment Patient needs continued PT services  PT Problem List Decreased strength;Decreased range of motion;Decreased activity tolerance;Decreased balance;Decreased mobility;Decreased cognition;Decreased knowledge of use of DME;Decreased safety awareness       PT Treatment Interventions DME instruction;Gait training;Functional  mobility training;Therapeutic activities;Therapeutic exercise;Balance training    PT Goals (Current goals can be found in the Care Plan section)  Acute Rehab PT Goals Patient Stated Goal: none stated PT Goal Formulation: With patient Time For Goal Achievement: 08/14/24 Potential to Achieve Goals: Good    Frequency Min 2X/week     Co-evaluation               AM-PAC PT 6 Clicks Mobility  Outcome Measure Help needed turning from your back to your side while in a flat bed without using bedrails?: None Help needed moving from lying on your back to sitting on the side of a flat bed without using bedrails?: A Little Help needed moving to and from a bed to a chair (including a wheelchair)?: A Lot Help  needed standing up from a chair using your arms (e.g., wheelchair or bedside chair)?: A Little Help needed to walk in hospital room?: A Lot Help needed climbing 3-5 steps with a railing? : Total 6 Click Score: 15    End of Session Equipment Utilized During Treatment: Gait belt;Oxygen  Activity Tolerance: Patient tolerated treatment well Patient left: in chair;with call bell/phone within reach;with chair alarm set;with nursing/sitter in room Nurse Communication: Mobility status PT Visit Diagnosis: Unsteadiness on feet (R26.81);Other abnormalities of gait and mobility (R26.89);Difficulty in walking, not elsewhere classified (R26.2)    Time: 9149-9089 PT Time Calculation (min) (ACUTE ONLY): 20 min   Charges:                 Allena Bulls, SPT   Allena Bulls 07/31/2024, 10:40 AM

## 2024-07-31 NOTE — Evaluation (Signed)
 Occupational Therapy Evaluation Patient Details Name: Colton Nguyen MRN: [id number] DOB: 05-03-1946 Today's Date: 07/31/2024   History of Present Illness   per 06/16/24: Pt is a 78 y.o. 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 Per 07/30/24: He presents today in acute respiratory distress.  Patient unable to provide history at the time my evaluation due to BiPAP and fatigue.  History garnered from his sister at bedside and chart review.  Patient called EMS after a fall when he was reportedly unable to get back up, upon EMS arrival he was in respiratory distress, tripoding with use of accessory muscles and wheezing.  Denies any LOC or head trauma during the fall.  In ED pCO2 found to be 108, patient very dyspneic and tachypneic on arrival.  He was placed on BiPAP and we were called for admission.  CXR without evidence of volume overload or pneumonia.  Reportedly patient did not experience any fever at home and has had no sick contacts.  His sister reports that he has had some weight gain over the last week and she suspects he is becoming volume overloaded.  She also reports that he has seemed to be slowing down over the last few days and she suspects he is developing pneumonia.     Clinical Impressions Patient was seen for OT evaluation this date. Prior to hospital admission, patient was living with adult son; per chart review he is not able to provide much assist, but patient reports 'he helps some. Patient was managing basic ADLs and family manages IADLs, he is on 2L at home. Patinet performed bed mobility with supervision, O2 dropped to 85% on 3L of O2, cues  for pursed lip breathing and improved to 90%; OT instructed on diaphragmatic breathing and ADL energy conservation techniques to implement into daily routine to reduce dyspnea with good return demo. Patient presents with deficits in safety awareness, standing balance/tolerance and overall activity tolerance, affecting safe and optimal ADL completion. Patient is currently requiring min A for ADLs and transfers.  Paient would benefit from skilled OT services to address noted impairments and functional limitations (see below for any additional details) in order to maximize safety and independence while minimizing future risk of falls, injury, and readmission. Anticipate the need for follow up OT services upon acute hospital DC.      If plan is discharge home, recommend the following:   A little help with walking and/or transfers;A little help with bathing/dressing/bathroom     Functional Status Assessment   Patient has had a recent decline in their functional status and demonstrates the ability to make significant improvements in function in a reasonable and predictable amount of time.     Equipment Recommendations   Other (comment) (defer to next venue of care)     Recommendations for Other Services         Precautions/Restrictions   Precautions Precautions: Fall Recall of Precautions/Restrictions: Impaired Restrictions Weight Bearing Restrictions Per Provider Order: No     Mobility Bed Mobility Overal bed mobility: Needs Assistance Bed Mobility: Supine to Sit     Supine to sit: Supervision     General bed mobility comments: able to mobilize to EOB without physical  assist; can balance EOB with supervision    Transfers Overall transfer level: Needs assistance Equipment used: 1 person hand held assist Transfers: Bed to chair/wheelchair/BSC       Step pivot transfers: Min assist     General transfer comment: cues for hand placement      Balance Overall  balance assessment: Needs assistance Sitting-balance support: Bilateral upper extremity supported, Feet supported Sitting balance-Leahy Scale: Fair Sitting balance - Comments: able to balance EOB with supervision; can balance with 1 UE if weight shifted to that side   Standing balance support: Single extremity supported Standing balance-Leahy Scale: Poor Standing balance comment: 1 HHA for standing balance                           ADL either performed or assessed with clinical judgement   ADL Overall ADL's : Needs assistance/impaired     Grooming: Wash/dry face;Set up   Upper Body Bathing: Minimal assistance   Lower Body Bathing: Moderate assistance   Upper Body Dressing : Minimal assistance   Lower Body Dressing: Maximal assistance   Toilet Transfer: Moderate assistance;Minimal assistance   Toileting- Clothing Manipulation and Hygiene: Moderate assistance         General ADL Comments: cued patient on sitting in figure 4 technique for LB bathing/dressing, patient with fair return demo     Vision         Perception         Praxis         Pertinent Vitals/Pain Pain Assessment Pain Assessment: No/denies pain     Extremity/Trunk Assessment Upper Extremity Assessment Upper Extremity Assessment: Overall WFL for tasks assessed   Lower Extremity Assessment Lower Extremity Assessment: Generalized weakness   Cervical / Trunk Assessment Cervical / Trunk Assessment: Kyphotic   Communication Communication Communication: Impaired Factors Affecting Communication: Reduced clarity of speech;Difficulty expressing self   Cognition Arousal: Alert Behavior During Therapy: WFL for tasks assessed/performed Cognition: No family/caregiver present to determine baseline, Cognition impaired                               Following commands: Intact       Cueing  General Comments   Cueing Techniques: Verbal cues;Gestural cues;Tactile cues;Visual  cues  O2 dropped to 85% while sitting EOB, max cues for pursed lip breathing techiques   Exercises     Shoulder Instructions      Home Living Family/patient expects to be discharged to:: Private residence Living Arrangements: Children Available Help at Discharge: Other (Comment) Type of Home: House Home Access: Stairs to enter [employer] of Steps: 3 Entrance Stairs-Rails: Right;Left;Can reach both Home Layout: One level     Bathroom Shower/Tub: Producer, Television/film/video: Handicapped height Bathroom Accessibility: Yes How Accessible: Accessible via walker Home Equipment: Rolling Walker (2 wheels);Cane - single point;Shower seat   Additional Comments: Pt on 2Ls via [place] at home per PT note 04/18/24      Prior Functioning/Environment Prior Level of Function : Independent/Modified Independent             Mobility Comments: Pt reports using RW for amb outside of home, ADLs Comments: reports he was able to manage basic ADLs PTA    OT Problem List: Decreased strength;Decreased activity tolerance;Impaired balance (sitting and/or standing);Decreased safety awareness;Decreased cognition   OT Treatment/Interventions: Self-care/ADL training;Therapeutic exercise;Energy conservation;DME and/or AE instruction  OT Goals(Current goals can be found in the care plan section)   Acute Rehab OT Goals Patient Stated Goal: to go home OT Goal Formulation: With patient Time For Goal Achievement: 08/14/24 Potential to Achieve Goals: Good ADL Goals Pt Will Perform Grooming: with modified independence;standing Pt Will Perform Upper Body Bathing: with modified independence;sitting Pt Will Perform Lower Body Dressing: with modified independence;sit to/from stand Pt Will Transfer to Toilet: with modified independence;ambulating;regular height toilet Pt Will Perform Toileting - Clothing Manipulation and hygiene: with modified independence;sit to/from stand   OT  Frequency:  Min 2X/week    Co-evaluation              AM-PAC OT 6 Clicks Daily Activity     Outcome Measure Help from another person eating meals?: None Help from another person taking care of personal grooming?: A Little Help from another person toileting, which includes using toliet, bedpan, or urinal?: A Little Help from another person bathing (including washing, rinsing, drying)?: A Little Help from another person to put on and taking off regular upper body clothing?: A Little Help from another person to put on and taking off regular lower body clothing?: A Little 6 Click Score: 19   End of Session Equipment Utilized During Treatment: Oxygen  Nurse Communication: Other (comment) (O2 readings)  Activity Tolerance: Patient tolerated treatment well Patient left: in chair;with call bell/phone within reach;with chair alarm set  OT Visit Diagnosis: Unsteadiness on feet (R26.81);Other abnormalities of gait and mobility (R26.89);Muscle weakness (generalized) (M62.81)                Time: 8957-8894 OT Time Calculation (min): 23 min Charges:  OT General Charges $OT Visit: 1 Visit OT Evaluation $OT Eval Low Complexity: 1 Low OT Treatments $Self Care/Home Management : 8-22 mins  Rogers Clause, OT/L MSOT, 07/31/2024

## 2024-07-31 NOTE — Progress Notes (Signed)
 Progress Note   Patient: Colton Nguyen FMW:[id number] DOB: 11/04/1945 DOA: 07/30/2024     1 DOS: the patient was seen and examined on 07/31/2024   Brief hospital course: Colton Nguyen is a 78 year old male with COPD, tobacco abuse, remote history of squamous cell lung cancer status post chemo and radiation in 2016, hypertension, chronic heart failure with preserved EF, hyperlipidemia, BPH, CAD.  Patient to the hospital with acute respiratory distress.  Patient called EMS after a fall when he was reportedly unable to get back up, upon EMS arrival he was in respiratory distress, tripoding with use of accessory muscles and wheezing.  Denies any LOC or head trauma during the fall. In ED pCO2 found to be 108, patient very dyspneic and tachypneic on arrival.  He was placed on BiPAP and we were called for admission.  Other hospital course as noted below  Assessment and Plan: Acute on chronic respiratory failure, with hypercapnia COPD exacerbation Patient uses 3 L chronically at home Upon arrival ABG was 7.28, pCO2 108, pO2 61, bicarb 50.7 Chest x-ray without infiltrate or edema no indication for antibiotics at this time Respiratory viral panel was negative Continue steroid to complete 5 days course Continue DuoNebs Patient has been weaned off BiPAP   Generalized weakness Chronic low back pain Osteoarthritis involving multiple joints Cervical stenosis Has already been to SNF this year, reportedly no longer has SNF days left Uses rolling walker at baseline Resides with his disabled son.  Family members rotate to help provide care At minimum will need home health at DC PT OT consulted He has outpatient follow-up with neurosurgery regarding cervical stenosis   Elevated troponin likely demand ischemia in the setting of acute respiratory failure - High-sensitivity troponin 74, no repeat done in ED. No chest pain will likely be demand ischemia secondary to hypoxia      Hyperlipidemia - Continue home meds   Heart failure with preserved EF - Chronically on losartan  - Sister believes he has been gaining fluid weight lately.  No edema seen on CXR - Some peripheral edema in lower extremities - Takes Lasix  MWF at home, resume this dose. Does not appear volume overloaded   Restless leg syndrome - Continue pramipexole    Primary cancer right upper lobe of the lung - Prior stage IIIa non-small cell lung cancer. has been on observation since 2016 - Follows outpatient with oncology   Hypertension Continue home medications   BPH Urinary incontinence without sensory awareness - Continue home dose Flomax    Depression - Patient reports no longer taking Wellbutrin    Tobacco abuse - Still smoking. Nicotine  patch.  Cessation counseling   Medication nonadherence Patient has been counseled extensively    Forgetfulness - Is having progressive decline, increasingly forgetful. 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  May be developing dementia, certainly worsened by persistent hypercapnia - Has upcoming neurology appointment in January      CODE STATUS: Full code   Consults: n/a   Family Communication: Sister at bedside for discussion    Subjective:  Patient currently on 4 L of intranasal oxygen  Able to complete her sentences He admits to improvement in respiratory function Denies nausea vomiting chest pain or cough  Physical Exam: Constitutional: Toxic appearing, frail HENT: Head Normocephalic and atraumatic.  Mucous membranes are moist.  Cardiovascular: Rate and Rhythm: Normal rate and regular rhythm.  Pulmonary: On BiPAP, tachypneic, poor aeration bilaterally, coarse airway Musculoskeletal:  Normal range of motion.  Skin: warm and dry.  not jaundiced.  Neurological: Drowsy, requires significant prompting to answer questions.  Difficulty with speech coherency secondary to BiPAP     Vitals:   07/31/24 1117 07/31/24  1201 07/31/24 1444 07/31/24 1550  BP: 106/86   136/63  Pulse: (!) 103   85  Resp: 14   14  Temp: 99 F (37.2 C)   99.1 F (37.3 C)  TempSrc: Oral     SpO2: 96% 97% 95% 96%  Weight:      Height:        Data Reviewed: Chest x-ray did not show any infiltrate or pulmonary edema     Latest Ref Rng & Units 07/31/2024    3:00 AM 07/30/2024    1:53 PM 06/14/2024    3:48 AM  CBC  WBC 4.0 - 10.5 K/uL 2.6  7.2  7.5   Hemoglobin 13.0 - 17.0 g/dL 8.8  89.7  8.6   Hematocrit 39.0 - 52.0 % 30.0  36.4  28.9   Platelets 150 - 400 K/uL 252  265  316        Latest Ref Rng & Units 07/31/2024    3:00 AM 07/30/2024    1:53 PM 06/15/2024    4:36 AM  BMP  Glucose 70 - 99 mg/dL 883  880  74   BUN 8 - 23 mg/dL 19  17  18    Creatinine 0.61 - 1.24 mg/dL 9.29  9.29  9.10   Sodium 135 - 145 mmol/L 143  144  137   Potassium 3.5 - 5.1 mmol/L 4.3  4.1  4.0   Chloride 98 - 111 mmol/L 97  95  98   CO2 22 - 32 mmol/L 41  43  33   Calcium  8.9 - 10.3 mg/dL 9.0  9.0  8.5     Time spent: 51 minutes  Author: Drue ONEIDA Potter, MD 07/31/2024 4:01 PM  For on call review [email or web address].

## 2024-07-31 NOTE — Plan of Care (Signed)
 Pt is now awake alert oriented x 3, pt states he is not sure about situation. Continous bipap. Pt repositoned in bed, pt repositions self as well. Pt with signs of pain, moaning and grimacing, prn morphine  given, with effective results. Call button within reach.    Problem: Education: Goal: Knowledge of General Education information will improve Description: Including pain rating scale, medication(s)/side effects and non-pharmacologic comfort measures Outcome: Progressing   Problem: Health Behavior/Discharge Planning: Goal: Ability to manage health-related needs will improve Outcome: Progressing   Problem: Clinical Measurements: Goal: Ability to maintain clinical measurements within normal limits will improve Outcome: Progressing Goal: Will remain free from infection Outcome: Progressing Goal: Diagnostic test results will improve Outcome: Progressing Goal: Respiratory complications will improve Outcome: Progressing Goal: Cardiovascular complication will be avoided Outcome: Progressing   Problem: Activity: Goal: Risk for activity intolerance will decrease Outcome: Progressing   Problem: Nutrition: Goal: Adequate nutrition will be maintained Outcome: Progressing   Problem: Coping: Goal: Level of anxiety will decrease Outcome: Progressing   Problem: Elimination: Goal: Will not experience complications related to bowel motility Outcome: Progressing Goal: Will not experience complications related to urinary retention Outcome: Progressing   Problem: Pain Managment: Goal: General experience of comfort will improve and/or be controlled Outcome: Progressing   Problem: Safety: Goal: Ability to remain free from injury will improve Outcome: Progressing   Problem: Skin Integrity: Goal: Risk for impaired skin integrity will decrease Outcome: Progressing

## 2024-08-01 ENCOUNTER — Telehealth (HOSPITAL_COMMUNITY): Payer: Self-pay

## 2024-08-01 ENCOUNTER — Other Ambulatory Visit (HOSPITAL_COMMUNITY): Payer: Self-pay

## 2024-08-01 DIAGNOSIS — J9601 Acute respiratory failure with hypoxia: Secondary | ICD-10-CM | POA: Diagnosis not present

## 2024-08-01 LAB — BASIC METABOLIC PANEL WITH GFR
Anion gap: 6 (ref 5–15)
BUN: 22 mg/dL (ref 8–23)
CO2: 40 mmol/L — ABNORMAL HIGH (ref 22–32)
Calcium: 8.7 mg/dL — ABNORMAL LOW (ref 8.9–10.3)
Chloride: 95 mmol/L — ABNORMAL LOW (ref 98–111)
Creatinine, Ser: 0.81 mg/dL (ref 0.61–1.24)
GFR, Estimated: >60 mL/min (ref >60)
Glucose, Bld: 107 mg/dL — ABNORMAL HIGH (ref 70–99)
Potassium: 3.9 mmol/L (ref 3.5–5.1)
Sodium: 141 mmol/L (ref 135–145)

## 2024-08-01 LAB — CBC WITH DIFFERENTIAL/PLATELET
Abs Immature Granulocytes: 0.03 K/uL (ref 0.00–0.07)
Basophils Absolute: 0 K/uL (ref 0.0–0.1)
Basophils Relative: 0 %
Eosinophils Absolute: 0 K/uL (ref 0.0–0.5)
Eosinophils Relative: 0 %
HCT: 31.3 % — ABNORMAL LOW (ref 39.0–52.0)
Hemoglobin: 9.1 g/dL — ABNORMAL LOW (ref 13.0–17.0)
Immature Granulocytes: 0 %
Lymphocytes Relative: 12 %
Lymphs Abs: 0.9 K/uL (ref 0.7–4.0)
MCH: 23.2 pg — ABNORMAL LOW (ref 26.0–34.0)
MCHC: 29.1 g/dL — ABNORMAL LOW (ref 30.0–36.0)
MCV: 79.8 fL — ABNORMAL LOW (ref 80.0–100.0)
Monocytes Absolute: 1 K/uL (ref 0.1–1.0)
Monocytes Relative: 14 %
Neutro Abs: 5.6 K/uL (ref 1.7–7.7)
Neutrophils Relative %: 74 %
Platelets: 243 K/uL (ref 150–400)
RBC: 3.92 MIL/uL — ABNORMAL LOW (ref 4.22–5.81)
RDW: 18 % — ABNORMAL HIGH (ref 11.5–15.5)
WBC: 7.5 K/uL (ref 4.0–10.5)
nRBC: 0 % (ref 0.0–0.2)

## 2024-08-01 MED ORDER — BUPROPION HCL ER (SR) 150 MG PO TB12
150.0000 mg | ORAL_TABLET | Freq: Two times a day (BID) | ORAL | Status: DC
  Administered 2024-08-01 – 2024-08-02 (×2): 150 mg via ORAL
  Filled 2024-08-01 (×3): qty 1

## 2024-08-01 MED ORDER — ENOXAPARIN SODIUM 40 MG/0.4ML IJ SOSY
40.0000 mg | PREFILLED_SYRINGE | INTRAMUSCULAR | Status: DC
  Administered 2024-08-01: 40 mg via SUBCUTANEOUS
  Filled 2024-08-01: qty 0.4

## 2024-08-01 MED ORDER — ENSURE PLUS HIGH PROTEIN PO LIQD
237.0000 mL | Freq: Two times a day (BID) | ORAL | Status: DC
  Administered 2024-08-02 (×2): 237 mL via ORAL

## 2024-08-01 NOTE — Progress Notes (Signed)
 Mobility Specialist - Progress Note   08/01/24 1100  Mobility  Activity Pivoted/transferred from chair to bed;Stood at bedside;Ambulated with assistance  Level of Assistance Standby assist, set-up cues, supervision of patient - no hands on  Assistive Device Front wheel walker  Distance Ambulated (ft) 5 ft  Range of Motion/Exercises All extremities  Activity Response Tolerated well  Mobility visit 1 Mobility  Mobility Specialist Start Time (ACUTE ONLY) 1005  Mobility Specialist Stop Time (ACUTE ONLY) 1015  Mobility Specialist Time Calculation (min) (ACUTE ONLY) 10 min   Pt was in recliner on O2 @ 2 L. Pt agreed to mobility. Pt was able to ambulate a pivot back in bed with needs in reach and bed alarm on.  Clem Rodes Mobility Specialist 08/01/24, 11:10 AM

## 2024-08-01 NOTE — Telephone Encounter (Signed)
 Pharmacy Patient Advocate Encounter  Insurance verification completed.    The patient is insured through [employer]. Patient has Medicare and is not eligible for a copay card, but may be able to apply for patient assistance or Medicare RX Payment Plan (Patient Must reach out to their plan, if eligible for payment plan), if available.    Ran test claim for Jardiance 10mg  and the current 30 day co-pay is $20.  Ran test claim for Farxiga  10mg  and the current 30 day co-pay is $20.   This test claim was processed through [employer]- copay amounts may vary at other pharmacies due to [employer], or as the patient moves through the different stages of their insurance plan.

## 2024-08-01 NOTE — Care Management Important Message (Signed)
 Important Message  Patient Details  Name: Colton Nguyen MRN: [id number] Date of Birth: 06/09/1946   Important Message Given:  Yes - Medicare IM     [name] 08/01/2024, 1:56 PM

## 2024-08-01 NOTE — Progress Notes (Signed)
 Progress Note   Patient: Colton Nguyen FMW:[id number] DOB: 04/29/1946 DOA: 07/30/2024     2 DOS: the patient was seen and examined on 08/01/2024   Brief hospital course: Colton Nguyen is a 78 year old male with COPD, tobacco abuse, remote history of squamous cell lung cancer status post chemo and radiation in 2016, hypertension, chronic heart failure with preserved EF, hyperlipidemia, BPH, CAD.  Patient to the hospital with acute respiratory distress.  Patient called EMS after a fall when he was reportedly unable to get back up, upon EMS arrival he was in respiratory distress, tripoding with use of accessory muscles and wheezing.  Denies any LOC or head trauma during the fall. In ED pCO2 found to be 108, patient very dyspneic and tachypneic on arrival.  He was placed on BiPAP and we were called for admission.   Other hospital course as noted below   Assessment and Plan: Acute on chronic respiratory failure, with hypercapnia COPD exacerbation Patient uses 3 L chronically at home Upon arrival ABG was 7.28, pCO2 108, pO2 61, bicarb 50.7 Chest x-ray without infiltrate or edema no indication for antibiotics at this time Respiratory viral panel was negative Continue steroid to complete 5 days course Continue DuoNebs Patient has been weaned off BiPAP   Generalized weakness Chronic low back pain Osteoarthritis involving multiple joints Cervical stenosis Has already been to SNF this year, reportedly no longer has SNF days left Uses rolling walker at baseline Resides with his disabled son.  Family members rotate to help provide care PT OT have recommended skilled nursing facility however patient considering home health He has outpatient follow-up with neurosurgery regarding cervical stenosis   Elevated troponin likely demand ischemia in the setting of acute respiratory failure - High-sensitivity troponin 74, no repeat done in ED. No chest pain will likely be demand ischemia  secondary to hypoxia     Hyperlipidemia - Continue home meds   Heart failure with preserved EF - Chronically on losartan  - Sister believes he has been gaining fluid weight lately.  No edema seen on CXR - Some peripheral edema in lower extremities - Takes Lasix  MWF at home, resume this dose. Does not appear volume overloaded   Restless leg syndrome - Continue pramipexole    Primary cancer right upper lobe of the lung - Prior stage IIIa non-small cell lung cancer. has been on observation since 2016 - Follows outpatient with oncology   Hypertension Continue home medications   BPH Urinary incontinence without sensory awareness - Continue home dose Flomax    Depression - Patient reports no longer taking Wellbutrin    Tobacco abuse - Still smoking. Nicotine  patch.  Cessation counseling   Medication nonadherence Patient has been counseled extensively    Forgetfulness - Is having progressive decline, increasingly forgetful. 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  May be developing dementia, certainly worsened by persistent hypercapnia - Has upcoming neurology appointment in January      CODE STATUS: Full code   Consults: n/a   Family Communication: Sister at bedside for discussion     Subjective:  Patient currently on 3 L of intranasal oxygen  Patient continues to appear weak and so PT OT recommending placement in skilled nursing facility He admits to improvement in respiratory function Denies nausea vomiting chest pain or cough   Physical Exam: Constitutional: Frail-appearing HENT: Head Normocephalic and atraumatic.  Mucous membranes are moist.  Cardiovascular: Rate and Rhythm: Normal rate and regular rhythm.  Pulmonary: On intranasal oxygen , wheezing improved Musculoskeletal:  Normal range of motion.  Skin: warm and dry. not jaundiced.  Neurological: Mental status improved      Data Reviewed:    Latest Ref Rng & Units 08/01/2024     3:35 AM 07/31/2024    3:00 AM 07/30/2024    1:53 PM  CBC  WBC 4.0 - 10.5 K/uL 7.5  2.6  7.2   Hemoglobin 13.0 - 17.0 g/dL 9.1  8.8  89.7   Hematocrit 39.0 - 52.0 % 31.3  30.0  36.4   Platelets 150 - 400 K/uL 243  252  265        Latest Ref Rng & Units 08/01/2024    3:35 AM 07/31/2024    3:00 AM 07/30/2024    1:53 PM  BMP  Glucose 70 - 99 mg/dL 892  883  880   BUN 8 - 23 mg/dL 22  19  17    Creatinine 0.61 - 1.24 mg/dL 9.18  9.29  9.29   Sodium 135 - 145 mmol/L 141  143  144   Potassium 3.5 - 5.1 mmol/L 3.9  4.3  4.1   Chloride 98 - 111 mmol/L 95  97  95   CO2 22 - 32 mmol/L 40  41  43   Calcium  8.9 - 10.3 mg/dL 8.7  9.0  9.0     Vitals:   08/01/24 0754 08/01/24 0800 08/01/24 1300 08/01/24 1505  BP:  (!) 150/82  (!) 151/85  Pulse:  88  (!) 102  Resp:  (!) 21  16  Temp:  97.8 F (36.6 C)  (!) 97.5 F (36.4 C)  TempSrc:  Oral    SpO2: 100% 97% 100% 98%  Weight:      Height:       Disposition: Skilled nursing facility  Author: Drue ONEIDA Potter, MD 08/01/2024 3:19 PM  For on call review [email or web address].

## 2024-08-01 NOTE — Progress Notes (Signed)
 Mobility Specialist - Progress Note  Pre-mobility: HR-90, SpO2-88%  During mobility: HR-100, SpO2-88%  Post-mobility: HR-100,  SPO2-89%     08/01/24 0900  Mobility  Activity Ambulated with assistance;Stood at bedside;Pivoted/transferred from bed to chair  Level of Assistance Standby assist, set-up cues, supervision of patient - no hands on  Assistive Device Front wheel walker  Distance Ambulated (ft) 4 ft  Range of Motion/Exercises All extremities  Activity Response Tolerated well  Mobility visit 1 Mobility  Mobility Specialist Start Time (ACUTE ONLY) 0847  Mobility Specialist Stop Time (ACUTE ONLY) 0901  Mobility Specialist Time Calculation (min) (ACUTE ONLY) 14 min   Pt was supine in bed on O2 @ 2L upon entry. Pt agreed to mobility today. Pt is able to get to the EOB independently today with bed features. Pt is also able today to STS with 2WW independently. Pt ambulated well. Pt O2 vitals were taken throughout activity as a precaution. After activity pt repositioned in the recliner with needs in reach and chair alarm on.  Clem Rodes Mobility Specialist 08/01/24, 9:20 AM

## 2024-08-01 NOTE — TOC Initial Note (Signed)
 Transition of Care [facility]) - Initial/Assessment Note    Patient Details  Name: Colton Nguyen MRN: [id number] Date of Birth: 03-28-1946  [facility]) CM/SW Contact:    Alfonso Rummer, LCSW Phone Number: 08/01/2024, 3:49 PM  Clinical Narrative:                 LCSW A. Hakeen Shipes met with patient at bedside to inform of skilled nursing facility recommendations. Pt declines transitioning to skilled nursing facility because he is the primary caregiver for his adult son. Pt reports he is opening to engaging with pt/ot with a a home health agency.         Patient Goals and CMS Choice            Expected Discharge Plan and Services        Home with home health                                       Prior Living Arrangements/Services                       Activities of Daily Living   ADL Screening (condition at time of admission) Independently performs ADLs?: No Does the patient have a NEW difficulty with bathing/dressing/toileting/self-feeding that is expected to last >3 days?: No Does the patient have a NEW difficulty with getting in/out of bed, walking, or climbing stairs that is expected to last >3 days?: No Does the patient have a NEW difficulty with communication that is expected to last >3 days?: No Is the patient deaf or have difficulty hearing?: No Does the patient have difficulty seeing, even when wearing glasses/contacts?: No Does the patient have difficulty concentrating, remembering, or making decisions?: Yes  Permission Sought/Granted                  Emotional Assessment              Admission diagnosis:  COPD exacerbation (HCC) [J44.1] Acute respiratory failure with hypoxia and hypercarbia (HCC) [J96.01, J96.02] Acute hypoxic respiratory failure (HCC) [J96.01] Patient Active Problem List   Diagnosis Date Noted   Acute hypoxic respiratory failure (HCC) 07/30/2024   Acute on chronic respiratory failure with  hypercapnia (HCC) 07/30/2024   Ataxia 06/13/2024   Frequent falls 06/13/2024   Restless leg syndrome 04/14/2024   Depression 04/14/2024   Protein-calorie malnutrition, severe 04/01/2024   Prediabetes 01/30/2023   Chronic respiratory failure with hypoxia, on home O2 therapy (HCC) 01/28/2023   Iron  deficiency anemia, unspecified 12/21/2019   Drug induced constipation 12/21/2019   Anxiety 12/21/2019   Tobacco use disorder 12/21/2019   Cognitive communication deficit 12/21/2019   Lumbar stenosis with neurogenic claudication 11/14/2019   BPH (benign prostatic hyperplasia) 08/01/2018   Hypertension 12/28/2016   COPD (chronic obstructive pulmonary disease) (HCC) 12/28/2016   Smoking trying to quit 04/01/2015   Squamous cell carcinoma of lung, stage III 03/07/2015   spiculated right upper lobe mass 02/14/2015   PCP:  [name], FNP Pharmacy:   [facility] - [place], [place] - [address] [place] [address] Phone: [phone] Fax: [phone]     Social Drivers of Health (SDOH) Social History: SDOH Screenings   Food Insecurity: No Food Insecurity (06/19/2024)  Housing: Unknown (06/19/2024)  Transportation Needs: No Transportation Needs (06/19/2024)  Utilities: Not At Risk (06/19/2024)  Depression (PHQ2-9): Low Risk  (  01/30/2023)  Financial Resource Strain: Low Risk  (01/30/2023)  Social Connections: Patient Declined (06/13/2024)  Recent Concern: Social Connections - Socially Isolated (04/16/2024)  Tobacco Use: High Risk (07/30/2024)   SDOH Interventions:     Readmission Risk Interventions     No data to display

## 2024-08-02 ENCOUNTER — Other Ambulatory Visit (HOSPITAL_COMMUNITY): Payer: Self-pay

## 2024-08-02 ENCOUNTER — Telehealth (HOSPITAL_COMMUNITY): Payer: Self-pay

## 2024-08-02 DIAGNOSIS — J9601 Acute respiratory failure with hypoxia: Secondary | ICD-10-CM | POA: Diagnosis not present

## 2024-08-02 LAB — CBC WITH DIFFERENTIAL/PLATELET
Abs Immature Granulocytes: 0.02 K/uL (ref 0.00–0.07)
Basophils Absolute: 0 K/uL (ref 0.0–0.1)
Basophils Relative: 0 %
Eosinophils Absolute: 0 K/uL (ref 0.0–0.5)
Eosinophils Relative: 0 %
HCT: 30.5 % — ABNORMAL LOW (ref 39.0–52.0)
Hemoglobin: 9.1 g/dL — ABNORMAL LOW (ref 13.0–17.0)
Immature Granulocytes: 0 %
Lymphocytes Relative: 17 %
Lymphs Abs: 1 K/uL (ref 0.7–4.0)
MCH: 23.3 pg — ABNORMAL LOW (ref 26.0–34.0)
MCHC: 29.8 g/dL — ABNORMAL LOW (ref 30.0–36.0)
MCV: 78.2 fL — ABNORMAL LOW (ref 80.0–100.0)
Monocytes Absolute: 0.8 K/uL (ref 0.1–1.0)
Monocytes Relative: 14 %
Neutro Abs: 4 K/uL (ref 1.7–7.7)
Neutrophils Relative %: 69 %
Platelets: 213 K/uL (ref 150–400)
RBC: 3.9 MIL/uL — ABNORMAL LOW (ref 4.22–5.81)
RDW: 17.4 % — ABNORMAL HIGH (ref 11.5–15.5)
WBC: 5.8 K/uL (ref 4.0–10.5)
nRBC: 0 % (ref 0.0–0.2)

## 2024-08-02 LAB — BASIC METABOLIC PANEL WITH GFR
Anion gap: 6 (ref 5–15)
BUN: 19 mg/dL (ref 8–23)
CO2: 37 mmol/L — ABNORMAL HIGH (ref 22–32)
Calcium: 8.3 mg/dL — ABNORMAL LOW (ref 8.9–10.3)
Chloride: 96 mmol/L — ABNORMAL LOW (ref 98–111)
Creatinine, Ser: 0.69 mg/dL (ref 0.61–1.24)
GFR, Estimated: >60 mL/min (ref >60)
Glucose, Bld: 89 mg/dL (ref 70–99)
Potassium: 4.1 mmol/L (ref 3.5–5.1)
Sodium: 139 mmol/L (ref 135–145)

## 2024-08-02 MED ORDER — PREDNISONE 20 MG PO TABS: 40.0000 mg | ORAL_TABLET | Freq: Every day | ORAL | 0 refills | Status: AC

## 2024-08-02 MED ORDER — BUPROPION HCL ER (SR) 150 MG PO TB12: 150.0000 mg | ORAL_TABLET | Freq: Two times a day (BID) | ORAL | 1 refills | Status: DC

## 2024-08-02 MED ORDER — BUDESON-GLYCOPYRROL-FORMOTEROL 160-9-4.8 MCG/ACT IN AERO
2.0000 | INHALATION_SPRAY | Freq: Two times a day (BID) | RESPIRATORY_TRACT | Status: DC
  Filled 2024-08-02: qty 5.9

## 2024-08-02 MED ORDER — BUDESON-GLYCOPYRROL-FORMOTEROL 160-9-4.8 MCG/ACT IN AERO: 2.0000 | INHALATION_SPRAY | Freq: Two times a day (BID) | RESPIRATORY_TRACT | 1 refills | Status: AC

## 2024-08-02 MED ORDER — DAPAGLIFLOZIN PROPANEDIOL 10 MG PO TABS: 10.0000 mg | ORAL_TABLET | Freq: Every day | ORAL | 1 refills | Status: DC

## 2024-08-02 MED ORDER — IPRATROPIUM-ALBUTEROL 0.5-2.5 (3) MG/3ML IN SOLN: 3.0000 mL | Freq: Four times a day (QID) | RESPIRATORY_TRACT | 11 refills | Status: AC | PRN

## 2024-08-02 MED ORDER — IPRATROPIUM-ALBUTEROL 0.5-2.5 (3) MG/3ML IN SOLN: 3.0000 mL | Freq: Every day | RESPIRATORY_TRACT | Status: DC

## 2024-08-02 NOTE — Plan of Care (Signed)

## 2024-08-02 NOTE — Telephone Encounter (Signed)
 Pharmacy Patient Advocate Encounter  Insurance verification completed.    The patient is insured through [employer]. Patient has Medicare and is not eligible for a copay card, but may be able to apply for patient assistance or Medicare RX Payment Plan (Patient Must reach out to their plan, if eligible for payment plan), if available.    Ran test claim for Anoro Ellipta and the current 30 day co-pay is $35.  Ran test claim for Stiolto Respimat and the current 30 day co-pay is $35.  Ran test claim for Breztri  and the current 30 day co-pay is $35.   This test claim was processed through [employer]- copay amounts may vary at other pharmacies due to [employer], or as the patient moves through the different stages of their insurance plan.

## 2024-08-02 NOTE — Discharge Summary (Signed)
 Physician Discharge Summary   Patient: Toriano Aikey MRN: [id number] DOB: 1945/12/10  Admit date:     07/30/2024  Discharge date: 08/02/24  Discharge Physician: Laree Lock   PCP: [name], FNP   Recommendations at discharge:   Follow-up with PCP in 1 week Can start spironolactone as tolerated  Follow-up with neurology outpatient as scheduled Follow-up with neurosurgery outpatient as scheduled  Home PT/OT arranged  Discharge Diagnoses: Principal Problem:   Acute hypoxic respiratory failure (HCC) Active Problems:   Acute on chronic respiratory failure with hypercapnia [facility])  Hospital Course: Colton Nguyen is a 78 year old male with COPD, tobacco abuse, remote history of squamous cell lung cancer status post chemo and radiation in 2016, hypertension, chronic heart failure with preserved EF, hyperlipidemia, BPH, CAD.  Patient to the hospital with acute respiratory distress.  Patient called EMS after a fall when he was reportedly unable to get back up, upon EMS arrival he was in respiratory distress, tripoding with use of accessory muscles and wheezing.  Denies any LOC or head trauma during the fall. Admitted for acute on chronic respiratory failure and COPD exacerbation.  Hospital course as below  Acute on chronic respiratory failure, with hypercapnia COPD exacerbation Patient uses 3 L chronically at home, now at baseline Upon arrival ABG was 7.28, pCO2 108, pO2 61, bicarb 50.7, s/p Bipap Chest x-ray without infiltrate or edema no indication for antibiotics Respiratory viral panel was negative Continue steroid to complete 5 days course Start Breztri , Continue DuoNebs prn   Generalized weakness Chronic low back pain Osteoarthritis involving multiple joints Cervical stenosis Has already been to SNF this year, reportedly no longer has SNF days left Uses rolling walker at baseline Resides with his disabled son.  Family members rotate to help provide  care PT OT have recommended skilled nursing facility however patient considering home health He has outpatient follow-up with neurosurgery regarding cervical stenosis   Elevated troponin likely demand ischemia in the setting of acute respiratory failure No chest pain will likely be demand ischemia secondary to hypoxia   Hyperlipidemia  Heart failure with preserved EF - on losartan , start Farxiga  - Recommend Spironolactone as tolerated to be started outpatient - Resume Lasix  MWF at home, euvolemic   Restless leg syndrome - Continue pramipexole    Primary cancer right upper lobe of the lung - Prior stage IIIa non-small cell lung cancer. 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 Nicotine  patch.  Cessation counseling   Medication nonadherence Patient has been counseled extensively    Forgetfulness - Is having progressive decline, increasingly forgetful - Prior head imaging does reveal small vascular ischemic disease.  May be developing dementia, certainly worsened by persistent hypercapnia - Has upcoming neurology appointment in January  Consultants: None Procedures performed: None  Disposition: Home health Diet recommendation:  Discharge Diet Orders (From admission, onward)     Start     Ordered   08/02/24 0000  Diet - low sodium heart healthy        08/02/24 1445            DISCHARGE MEDICATION: Allergies as of 08/02/2024   No Known Allergies      Medication List     TAKE these medications    acetaminophen  500 MG tablet Commonly  known as: TYLENOL  Take 1,000 mg by mouth every 6 (six) hours as needed for moderate pain or headache.   albuterol  108 (90 Base) MCG/ACT inhaler Commonly known as: VENTOLIN  HFA Inhale 1-2 puffs  into the lungs every 4 (four) hours as needed for wheezing or shortness of breath.   aspirin  81 MG tablet Take 81 mg by mouth daily.   atorvastatin  40 MG tablet Commonly known as: LIPITOR Take 1 tablet (40 mg total) by mouth daily.   azelastine  0.1 % nasal spray Commonly known as: ASTELIN  Place 1 spray into both nostrils 2 (two) times daily.   budesonide -glycopyrrolate -formoterol  160-9-4.8 MCG/ACT Aero inhaler Commonly known as: BREZTRI  Inhale 2 puffs into the lungs 2 (two) times daily.   buPROPion  150 MG 12 hr tablet Commonly known as: WELLBUTRIN  SR Take 1 tablet (150 mg total) by mouth 2 (two) times daily.   cyclobenzaprine  5 MG tablet Commonly known as: FLEXERIL  TAKE ONE TABLET (5 MG TOTAL) BY MOUTH THREE TIMES DAILY AS NEEDED FOR MUSCLE SPASMS.   dapagliflozin  propanediol 10 MG Tabs tablet Commonly known as: Farxiga  Take 1 tablet (10 mg total) by mouth daily before breakfast.   ferrous sulfate  325 (65 FE) MG tablet Take 1 tablet (325 mg total) by mouth daily with breakfast.   fluticasone  50 MCG/ACT nasal spray Commonly known as: FLONASE  Place 2 sprays into both nostrils daily as needed for allergies.   furosemide  40 MG tablet Commonly known as: LASIX  Take 1 tablet (40 mg total) by mouth every Monday, Wednesday, and Friday.   ipratropium-albuterol  0.5-2.5 (3) MG/3ML Soln Commonly known as: DUONEB Take 3 mLs by nebulization every 6 (six) hours as needed. What changed:  when to take this reasons to take this   losartan  50 MG tablet Commonly known as: COZAAR  Take 1 tablet (50 mg total) by mouth daily.   nicotine  21 mg/24hr patch Commonly known as: NICODERM CQ  - dosed in mg/24 hours PLACE ONE PATCH (21 MG TOTAL) ONTO THE SKIN DAILY.   OXYGEN  Inhale 2 L/min into the lungs continuous.   pramipexole  0.125 MG tablet Commonly known as: MIRAPEX  Take 1 tablet (0.125 mg total) by mouth at bedtime.   predniSONE  20 MG tablet Commonly known as: DELTASONE  Take 2  tablets (40 mg total) by mouth daily with breakfast for 1 day. Start taking on: August 03, 2024   tamsulosin  0.4 MG Caps capsule Commonly known as: FLOMAX  TAKE ONE CAPSULE (0.4 MG TOTAL) BY MOUTH DAILY. (NEEDS APPT FOR FUTURE FILL)   Vitamin D3 125 MCG (5000 UT) Tabs Take 5,000 Units by mouth daily.        Discharge Exam: Filed Weights   07/30/24 1341  Weight: 61.2 kg   Constitutional: Frail-appearing Cardiovascular: Rate and Rhythm: Normal rate and regular rhythm.  Pulmonary: On intranasal oxygen , no wheezing Musculoskeletal:  Normal range of motion.  Skin: warm and dry. not jaundiced.  Neurological: awake, alert  Condition at discharge: fair  The results of significant diagnostics from this hospitalization (including imaging, microbiology, ancillary and laboratory) are listed below for reference.   Imaging Studies: DG Chest Port 1 View Result Date: 07/30/2024 EXAM: 1 VIEW(S) XRAY OF THE CHEST 07/30/2024 02:09:35 PM COMPARISON: XR Chest 06/14/2024 and CT Chest 04/16/2024. CLINICAL HISTORY: SHOB SHOB FINDINGS: LUNGS AND PLEURA: Scarring and volume loss in the right lung is unchanged. Fluid or thickened pleura in the right apex is also stable. This corresponds to post-treatment changes as seen on prior CT chest 04/16/2024. Left lung is clear. No developing  consolidation or airspace disease. No pleural effusions. No pneumothorax. HEART AND MEDIASTINUM: Normal heart size and pulmonary vascularity. No acute abnormality of the cardiac and mediastinal silhouettes. BONES AND SOFT TISSUES: Postoperative changes in the cervical spine. No acute osseous abnormality. IMPRESSION: 1. No acute cardiopulmonary process to account for shortness of breath. 2. Stable post-treatment changes in the right lung, including scarring, volume loss, and right apical pleural thickening/fluid, unchanged from prior studies. Electronically signed by: Elsie Gravely MD 07/30/2024 02:18 PM EST RP Workstation:  [id number]    Microbiology: Results for orders placed or performed during the hospital encounter of 07/30/24  Resp panel by RT-PCR (RSV, Flu A&B, Covid) Anterior Nasal Swab     Status: None   Collection Time: 07/30/24  3:48 PM   Specimen: Anterior Nasal Swab  Result Value Ref Range Status   SARS Coronavirus 2 by RT PCR NEGATIVE NEGATIVE Final    Comment: (NOTE) SARS-CoV-2 target nucleic acids are NOT DETECTED.  The SARS-CoV-2 RNA is generally detectable in upper respiratory specimens during the acute phase of infection. The lowest concentration of SARS-CoV-2 viral copies this assay can detect is 138 copies/mL. A negative result does not preclude SARS-Cov-2 infection and should not be used as the sole basis for treatment or other patient management decisions. A negative result may occur with  improper specimen collection/handling, submission of specimen other than nasopharyngeal swab, presence of viral mutation(s) within the areas targeted by this assay, and inadequate number of viral copies(<138 copies/mL). A negative result must be combined with clinical observations, patient history, and epidemiological information. The expected result is Negative.  Fact Sheet for Patients:  bloggercourse.com  Fact Sheet for Healthcare Providers:  seriousbroker.it  This test is no t yet approved or cleared by the [place]  FDA and  has been authorized for detection and/or diagnosis of SARS-CoV-2 by FDA under an Emergency Use Authorization (EUA). This EUA will remain  in effect (meaning this test can be used) for the duration of the COVID-19 declaration under Section 564(b)(1) of the Act, 21 U.S.C.section 360bbb-3(b)(1), unless the authorization is terminated  or revoked sooner.       Influenza A by PCR NEGATIVE NEGATIVE Final   Influenza B by PCR NEGATIVE NEGATIVE Final    Comment: (NOTE) The Xpert Xpress SARS-CoV-2/FLU/RSV plus assay  is intended as an aid in the diagnosis of influenza from Nasopharyngeal swab specimens and should not be used as a sole basis for treatment. Nasal washings and aspirates are unacceptable for Xpert Xpress SARS-CoV-2/FLU/RSV testing.  Fact Sheet for Patients: bloggercourse.com  Fact Sheet for Healthcare Providers: seriousbroker.it  This test is not yet approved or cleared by the [place]  FDA and has been authorized for detection and/or diagnosis of SARS-CoV-2 by FDA under an Emergency Use Authorization (EUA). This EUA will remain in effect (meaning this test can be used) for the duration of the COVID-19 declaration under Section 564(b)(1) of the Act, 21 U.S.C. section 360bbb-3(b)(1), unless the authorization is terminated or revoked.     Resp Syncytial Virus by PCR NEGATIVE NEGATIVE Final    Comment: (NOTE) Fact Sheet for Patients: bloggercourse.com  Fact Sheet for Healthcare Providers: seriousbroker.it  This test is not yet approved or cleared by the [place]  FDA and has been authorized for detection and/or diagnosis of SARS-CoV-2 by FDA under an Emergency Use Authorization (EUA). This EUA will remain in effect (meaning this test can be used) for the duration of the COVID-19 declaration under Section 564(b)(1) of the Act, 21 U.S.C.  section 360bbb-3(b)(1), unless the authorization is terminated or revoked.  Performed at [facility], [address] [place], [place] [address]     Labs: CBC: Recent Labs  Lab 07/30/24 1353 07/31/24 0300 08/01/24 0335 08/02/24 0311  WBC 7.2 2.6* 7.5 5.8  NEUTROABS 5.2  --  5.6 4.0  HGB 10.2* 8.8* 9.1* 9.1*  HCT 36.4* 30.0* 31.3* 30.5*  MCV 83.5 80.0 79.8* 78.2*  PLT 265 252 243 213   Basic Metabolic Panel: Recent Labs  Lab 07/30/24 1353 07/31/24 0300 08/01/24 0335 08/02/24 0311  NA 144 143 141 139  K 4.1 4.3  3.9 4.1  CL 95* 97* 95* 96*  CO2 43* 41* 40* 37*  GLUCOSE 119* 116* 107* 89  BUN 17 19 22 19   CREATININE 0.70 0.70 0.81 0.69  CALCIUM  9.0 9.0 8.7* 8.3*   Liver Function Tests: Recent Labs  Lab 07/30/24 1353 07/31/24 0300  AST 20 15  ALT 12 9  ALKPHOS 109 95  BILITOT 0.3 <0.2  PROT 6.3* 5.8*  ALBUMIN  3.8 3.5   CBG: No results for input(s): GLUCAP in the last 168 hours.  Discharge time spent: greater than 30 minutes.  Signed: Laree Lock, MD [facility] 08/02/2024

## 2024-08-02 NOTE — Progress Notes (Signed)
 Occupational Therapy Treatment Patient Details Name: Colton Nguyen MRN: [id number] DOB: 1946-02-21 Today's Date: 08/02/2024   History of present illness per 06/16/24: Pt is a 78 y.o. 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 Per 07/30/24: He presents today in acute respiratory distress.  Patient unable to provide history at the time my evaluation due to BiPAP and fatigue.  History garnered from his sister at bedside and chart review.  Patient called EMS after a fall when he was reportedly unable to get back up, upon EMS arrival he was in respiratory distress, tripoding with use of accessory muscles and wheezing.  Denies any LOC or head trauma during the fall.  In ED pCO2 found to be 108, patient very dyspneic and tachypneic on arrival.  He was placed on BiPAP and we were called for admission.  CXR without evidence of volume overload or pneumonia.  Reportedly patient did not experience any fever at home and has had no sick contacts.  His sister reports that he has had some weight gain over the last week and she suspects he is becoming volume overloaded.  She also reports that he has seemed to be slowing down over the last few days and she suspects he is developing pneumonia.   OT comments  Patient seen for OT treatment on this date. Upon arrival to room patient sitting in recliner, agreeable to treatment. Patient with strong odor, OT encouraged ADL, patient agreeable. Patient stood at sink for thorough pericare/bathing and grooming tasks with minA/CGA for pericare; patient on 2L throughout OT tx and remained above 90% when checked. Patient with near bowel  incontinence, BSC set up behind patient and patient required increased time to void.  Patient ended treatment in recliner with bed/chair alarm on and all needs within reach. Patient making good progress toward goals, will continue to follow POC. Discharge recommendation remains appropriate.        If plan is discharge home, recommend the following:  A little help with walking and/or transfers;A little help with bathing/dressing/bathroom   Equipment Recommendations  Other (comment)    Recommendations for Other Services      Precautions / Restrictions Precautions Precautions: Fall Recall of Precautions/Restrictions: Impaired Restrictions Weight Bearing Restrictions Per Provider Order: No       Mobility Bed Mobility                    Transfers Overall transfer level: Needs assistance Equipment used: Rolling walker (2 wheels) Transfers: Sit to/from Stand Sit to Stand: Supervision                 Balance                                           ADL either performed or assessed with clinical judgement   ADL Overall ADL's : Needs assistance/impaired     Grooming: Wash/dry face;Set up Grooming Details (indicate cue type and reason): standing at sink     Lower Body Bathing: Contact guard assist;Sit to/from stand Lower Body Bathing Details (indicate cue type and reason): stood at  sink     Lower Body Dressing: Maximal assistance Lower Body Dressing Details (indicate cue type and reason): unable to don/doff socks Toilet Transfer: Contact guard assist;BSC/3in1;Rolling walker (2 wheels) Toilet Transfer Details (indicate cue type and reason): lowering A onto [facility] Toileting- Clothing Manipulation and Hygiene: Minimal assistance Toileting - Clothing Manipulation Details (indicate cue type and reason): voided using urinal while standing and BSC for BM            Extremity/Trunk Assessment              Vision       Perception      Praxis     Communication Communication Communication: Impaired Factors Affecting Communication: Reduced clarity of speech;Difficulty expressing self   Cognition Arousal: Alert Behavior During Therapy: WFL for tasks assessed/performed Cognition: No family/caregiver present to determine baseline, Cognition impaired                               Following commands: Intact        Cueing   Cueing Techniques: Verbal cues, Gestural cues, Tactile cues, Visual cues  Exercises      Shoulder Instructions       General Comments 2L throughout, remained above 90%    Pertinent Vitals/ Pain       Pain Assessment Pain Assessment: No/denies pain  Home Living                                          Prior Functioning/Environment              Frequency  Min 2X/week        Progress Toward Goals  OT Goals(current goals can now be found in the care plan section)  Progress towards OT goals: Progressing toward goals  Acute Rehab OT Goals Patient Stated Goal: to go home OT Goal Formulation: With patient Time For Goal Achievement: 08/14/24 Potential to Achieve Goals: Good ADL Goals Pt Will Perform Grooming: with modified independence;standing Pt Will Perform Upper Body Bathing: with modified independence;sitting Pt Will Perform Lower Body Dressing: with modified independence;sit to/from stand Pt Will Transfer to Toilet: with modified independence;ambulating;regular height toilet Pt Will Perform Toileting - Clothing Manipulation and hygiene: with modified independence;sit to/from stand  Plan      Co-evaluation                 AM-PAC OT 6 Clicks Daily Activity     Outcome Measure   Help from another person eating meals?: None Help from another person taking care of personal grooming?: A Little Help from another person toileting, which includes using toliet, bedpan, or urinal?: A Little Help from another person bathing (including  washing, rinsing, drying)?: A Little Help from another person to put on and taking off regular upper body clothing?: A Little Help from another person to put on and taking off regular lower body clothing?: A Little 6 Click Score: 19    End of Session Equipment Utilized During Treatment: Oxygen   OT Visit Diagnosis: Unsteadiness on feet (R26.81);Other abnormalities of gait and mobility (R26.89);Muscle weakness (generalized) (M62.81)   Activity Tolerance Patient tolerated treatment well   Patient Left in chair;with call bell/phone within reach;with chair alarm set   Nurse Communication          Time: 8493-8469 OT Time Calculation (min): 24  min  Charges: OT General Charges $OT Visit: 1 Visit OT Treatments $Self Care/Home Management : 23-37 mins  Rogers Clause, OT/L MSOT, 08/02/2024

## 2024-08-02 NOTE — Progress Notes (Signed)
 Mobility Specialist - Progress Note  Pre-mobility: , SpO2-92%  During mobility:SpO2-95%  Post-mobility: SPO2-93%     08/02/24 0900  Mobility  Activity Ambulated with assistance;Stood at bedside;Respositioned in chair;Pivoted/transferred from chair to bed  Level of Assistance Standby assist, set-up cues, supervision of patient - no hands on  Assistive Device Front wheel walker  Distance Ambulated (ft) 15 ft  Range of Motion/Exercises Active;All extremities  Activity Response Tolerated well  Mobility visit 1 Mobility  Mobility Specialist Start Time (ACUTE ONLY) [id number]  Mobility Specialist Stop Time (ACUTE ONLY) [id number]  Mobility Specialist Time Calculation (min) (ACUTE ONLY) 22 min   Pt was supine in bed with the HOB elevated on O2 @ 3L upon entry. Pt agreed to mobility. Pt O2 vitals were taken throughout activity as a precaution. Pt is able today to get to the EOB independently with bed features. Pt is able today to STS independently with 2 WW. Pt ambulated well with 2 [employer]. After activity pt repositioned in the recliner with needs in reach. Pt before leaving requested to get back in bed due to LB pain. Pt is in bed with needs in reach and bed alarm on.  Clem Rodes Mobility Specialist 08/02/24, 9:44 AM

## 2024-08-02 NOTE — Telephone Encounter (Signed)
 Pharmacy Patient Advocate Encounter  Insurance verification completed.    The patient is insured through [facility] and [place]. Patient has Medicare and is not eligible for a copay card, but may be able to apply for patient assistance or Medicare RX Payment Plan (Patient Must reach out to their plan, if eligible for payment plan), if available.    Ran test claim for Advair Diskus 100-50 and the current 30 day co-pay is $5.  Ran test claim for Symbicort  160-4.5 and the current 30 day co-pay is $5.  This test claim was processed through [employer]- copay amounts may vary at other pharmacies due to [employer], or as the patient moves through the different stages of their insurance plan.

## 2024-08-02 NOTE — Progress Notes (Signed)
   08/02/24 1734  AVS Discharge Documentation  AVS Discharge Instructions Including Medications Provided to patient/caregiver  Name of Person Receiving AVS Discharge Instructions Including Medications Arley  Name of Clinician That Reviewed AVS Discharge Instructions Including Medications Prentice PEAK

## 2024-08-02 NOTE — TOC Transition Note (Signed)
 Transition of Care [facility]) - Discharge Note   Patient Details  Name: Colton Nguyen MRN: [id number] Date of Birth: March 16, 1946  Transition of Care [facility]) CM/SW Contact:  Alfonso Rummer, LCSW Phone Number: 08/02/2024, 2:49 PM   Clinical Narrative:     Pt will discharge home with [facility] home health following pt for physical and occupational therapy. Pt sister reports she will pick up [name].   Final next level of care: Home w [facility] Health Services ([facility]) Barriers to Discharge: Barriers Resolved   Patient Goals and CMS Choice     Choice offered to / list presented to : Patient      Discharge Placement             Home with [facility]      Name of family member notified: [name] Arts pt sister Patient and family notified of of transfer: 08/02/24  Discharge Plan and Services Additional resources added to the After Visit Summary for                            [facility] Arranged: PT, OT [facility] Agency: [facility] Date [facility] Agency Contacted: 08/02/24   Representative spoke with at [facility] Agency: Larraine Blush  Social Drivers of Health (SDOH) Interventions SDOH Screenings   Food Insecurity: No Food Insecurity (06/19/2024)  Housing: Unknown (06/19/2024)  Transportation Needs: No Transportation Needs (06/19/2024)  Utilities: Not At Risk (06/19/2024)  Depression (PHQ2-9): Low Risk  (01/30/2023)  Financial Resource Strain: Low Risk  (01/30/2023)  Social Connections: Patient Declined (06/13/2024)  Recent Concern: Social Connections - Socially Isolated (04/16/2024)  Tobacco Use: High Risk (07/30/2024)     Readmission Risk Interventions     No data to display

## 2024-08-04 ENCOUNTER — Telehealth: Payer: Self-pay

## 2024-08-04 NOTE — Transitions of Care (Post Inpatient/ED Visit) (Signed)
   08/04/2024  Name: Colton Nguyen MRN: [id number] DOB: 24-Apr-1946  Today's TOC FU Call Status: Today's TOC FU Call Status:: Unsuccessful Call (1st Attempt) Unsuccessful Call (1st Attempt) Date: 08/04/24  Attempted to reach the patient regarding the most recent Inpatient/ED visit.  Follow Up Plan: Additional outreach attempts will be made to reach the patient to complete the Transitions of Care (Post Inpatient/ED visit) call.   Medford Balboa, BSN, RN [place]  VBCI - [employer] [facility] RN Care Manager [phone]

## 2024-08-07 ENCOUNTER — Telehealth: Payer: Self-pay

## 2024-08-07 NOTE — Patient Instructions (Signed)
 Visit Information  Thank you for taking time to visit with me today. Please don't hesitate to contact me if I can be of assistance to you before our next scheduled telephone appointment.  Our next appointment is by telephone on Tuesday December 9th at 11:30am  Following is a copy of your care plan:   Goals Addressed             This Visit's Progress    VBCI Transitions of Care (TOC) Care Plan       Problems: (reviewed 08/07/24) Recent Hospitalization for treatment of Falls, COPD exacerbation Knowledge Deficit Related to Current Health Status Hospital or ED Adm Risk is 94%  Goal:  (reviewed 08/07/24) Over the next 30 days, the patient will not experience hospital readmission  Interventions:  (reviewed 08/07/24)  Falls Interventions: Reviewed medications and discussed potential side effects of medications such as dizziness and frequent urination Advised patient of importance of notifying provider of falls Assessed for falls since last encounter Assessed patients knowledge of fall risk prevention secondary to previously provided education Screening for signs and symptoms of depression related to chronic disease state  Assessed social determinant of health barriers Use assistive device as needed Clear pathways, remove obstacles, throw rugs HHPT and OT by [facility] Take medication for RLS - 10/27 - Medication has been changed to Mirapex  with better results  COPD Interventions: (reviewed 08/07/24) Advised patient to track and manage COPD triggers Assessed social determinant of health barriers Discussed the importance of adequate rest and management of fatigue with COPD Provided education about and advised patient to utilize infection prevention strategies to reduce risk of respiratory infection Provided instruction about proper use of medications used for management of COPD including inhalers Use of home oxygen  Do not smoke if using oxygen . Do not expose concentrator or tanks to  heat. Leave the tanks upright Use Nicotine  patch as needed but do not smoke while using the patch    Patient Self Care Activities:   (reviewed 08/07/24) Attend all scheduled provider appointments Call pharmacy for medication refills 3-7 days in advance of running out of medications Call provider office for new concerns or questions  Notify RN Care Manager of [facility] call rescheduling needs Participate in Transition of Care Program/Attend TOC scheduled calls Perform all self care activities independently  Take medications as prescribed   Encouraged smoking cessation and/or reduction - The patient states he smokes between 2 and 2.5 packs a day  Plan:  Telephone follow up appointment with care management team member scheduled for:  Tuesday December 9th at 11:30am        The patient verbalized understanding of instructions, educational materials, and care plan provided today and agreed to receive a mailed copy of patient instructions, educational materials, and care plan.   Telephone follow up appointment with care management team member scheduled for: Face to Face appointment with care management team member scheduled for:   Please call the care guide team at [phone] if you need to cancel or reschedule your appointment.   Please call the Suicide and Crisis Lifeline: 988 call the [place]  National Suicide Prevention Lifeline: 680 857 9373 or TTY: 959-339-1558 TTY 346-099-4651) to talk to a trained counselor if you are experiencing a Mental Health or Behavioral Health Crisis or need someone to talk to.  Medford Balboa, BSN, RN [place]  VBCI - [employer] [facility] RN Care Manager [phone]

## 2024-08-07 NOTE — Transitions of Care (Post Inpatient/ED Visit) (Signed)
 08/07/2024  Name: Colton Nguyen MRN: [id number] DOB: February 18, 1946  Today's TOC FU Call Status: Today's TOC FU Call Status:: Successful TOC FU Call Completed TOC FU Call Complete Date: 08/07/24  Patient's Name and Date of Birth confirmed. Name, DOB  Transition Care Management Follow-up Telephone Call How have you been since you were released from the hospital?: Same Any questions or concerns?: No  Items Reviewed: Did you receive and understand the discharge instructions provided?: No (His sister helps him with discharge planning) Medications obtained,verified, and reconciled?: Partial Review Completed Reason for Partial Mediation Review: His sister manages his medications Any new allergies since your discharge?: No Dietary orders reviewed?: Yes Type of Diet Ordered:: Low sodium Heart Healthy Do you have support at home?: Yes People in Home [RPT]: child(ren), adult, sibling(s) Name of Support/Comfort Primary Source: He lives with his son who is autistic. His sister helps to care for them both  Medications Reviewed Today: Medications Reviewed Today     Reviewed by Moises Reusing, RN (Case Manager) on 08/07/24 at 1603  Med List Status: <None>   Medication Order Taking? Sig Documenting Provider Last Dose Status Informant  acetaminophen  (TYLENOL ) 500 MG tablet [id number]  Take 1,000 mg by mouth every 6 (six) hours as needed for moderate pain or headache. [provider]  Active Pharmacy Records, Family Member  albuterol  (VENTOLIN  HFA) [phone] Base) MCG/ACT inhaler [id number]  Inhale 1-2 puffs into the lungs every 4 (four) hours as needed for wheezing or shortness of breath. Lenon Marien CROME, MD  Active Pharmacy Records, Family Member  aspirin  81 MG tablet [id number]  Take 81 mg by mouth daily. [provider]  Active Pharmacy Records, Family Member           Med Note DAWN, [facility] R   Sat Dec 28, 2015  4:48 AM) .  atorvastatin  (LIPITOR) 40 MG tablet [id number]   Take 1 tablet (40 mg total) by mouth daily.  Patient not taking: Reported on 06/27/2024   [name], FNP  Active Pharmacy Records, Family Member  azelastine  (ASTELIN ) 0.1 % nasal spray [id number]  Place 1 spray into both nostrils 2 (two) times daily. [name], FNP  Active   budesonide -glycopyrrolate -formoterol  (BREZTRI ) 160-9-4.8 MCG/ACT AERO inhaler [id number]  Inhale 2 puffs into the lungs 2 (two) times daily. Jerelene Critchley, MD  Active   buPROPion  (WELLBUTRIN  SR) 150 MG 12 hr tablet [id number]  Take 1 tablet (150 mg total) by mouth 2 (two) times daily. Jerelene Critchley, MD  Active   Cholecalciferol  (VITAMIN D3) 5000 UNITS TABS [id number]  Take 5,000 Units by mouth daily. [provider]  Active Pharmacy Records, Family Member  cyclobenzaprine  (FLEXERIL ) 5 MG tablet [id number]  TAKE ONE TABLET (5 MG TOTAL) BY MOUTH THREE TIMES DAILY AS NEEDED FOR MUSCLE SPASMS.  Patient not taking: Reported on 07/31/2024   [name], FNP  Active Pharmacy Records, Family Member  dapagliflozin  propanediol (FARXIGA ) 10 MG TABS tablet [id number]  Take 1 tablet (10 mg total) by mouth daily before breakfast. Jerelene Critchley, MD  Active   ferrous sulfate  325 (65 FE) MG tablet [id number]  Take 1 tablet (325 mg total) by mouth daily with breakfast. [name], FNP  Active Pharmacy Records, Family Member  fluticasone  (FLONASE ) 50 MCG/ACT nasal spray [id number]  Place 2 sprays into both nostrils daily as needed for allergies.   Patient not taking: No sig reported   [provider]  Active Pharmacy Records, Family Member  Med Note JERALYN DUNCANS A   Fri Nov 03, 2019  3:16 PM)    furosemide  (LASIX ) 40 MG tablet [id number]  Take 1 tablet (40 mg total) by mouth every Monday, Wednesday, and Friday. [name], FNP  Active   ipratropium-albuterol  (DUONEB) 0.5-2.5 (3) MG/3ML SOLN [id number]  Take 3 mLs by nebulization every 6 (six) hours as needed. Jerelene Critchley, MD   Active   losartan  (COZAAR ) 50 MG tablet [id number]  Take 1 tablet (50 mg total) by mouth daily. [name], FNP  Active Pharmacy Records, Family Member  nicotine  (NICODERM CQ  - DOSED IN MG/24 HOURS) 21 mg/24hr patch [id number]  PLACE ONE PATCH (21 MG TOTAL) ONTO THE SKIN DAILY.  Patient not taking: Reported on 06/27/2024   [name], FNP  Active   OXYGEN  [id number] Yes Inhale 2 L/min into the lungs continuous.  Patient taking differently: Inhale 3 L/min into the lungs continuous.   [provider]  Active   pramipexole  (MIRAPEX ) 0.125 MG tablet [id number]  Take 1 tablet (0.125 mg total) by mouth at bedtime. [name], FNP  Active   tamsulosin  (FLOMAX ) 0.4 MG CAPS capsule [id number]  TAKE ONE CAPSULE (0.4 MG TOTAL) BY MOUTH DAILY. (NEEDS APPT FOR FUTURE FILL) Scoggins, Amber, NP  Active Pharmacy Records, Family Member            Home Care and Equipment/Supplies: Were Home Health Services Ordered?: Yes Name of Home Health Agency:: [facility] Has Agency set up a time to come to your home?: No (Unsure due to memory issues. CM to call sister) EMR reviewed for Home Health Orders: Orders present/patient has not received call (refer to CM for follow-up) Any new equipment or medical supplies ordered?: No  Functional Questionnaire: Do you need assistance with bathing/showering or dressing?: No Do you need assistance with meal preparation?: No Do you need assistance with eating?: No Do you have difficulty maintaining continence: Yes (Incontinent of urine at times) Do you need assistance with getting out of bed/getting out of a chair/moving?: No Do you have difficulty managing or taking your medications?: Yes (His sister fills a pill box and sets out the medications)  Follow up appointments reviewed: PCP Follow-up appointment confirmed?: Yes Date of PCP follow-up appointment?: 08/08/24 Follow-up Provider: Alan Orlean Specialist [facility] Follow-up appointment  confirmed?: NA Do you need transportation to your follow-up appointment?: No Do you understand care options if your condition(s) worsen?: Yes-patient verbalized understanding  SDOH Interventions Today    Flowsheet Row Most Recent Value  SDOH Interventions   Food Insecurity Interventions Intervention Not Indicated  Housing Interventions Intervention Not Indicated  Transportation Interventions Intervention Not Indicated  Utilities Interventions Intervention Not Indicated    Goals Addressed             This Visit's Progress    VBCI Transitions of Care (TOC) Care Plan       Problems: (reviewed 08/07/24) Recent Hospitalization for treatment of Falls, COPD exacerbation Knowledge Deficit Related to Current Health Status Hospital or ED Adm Risk is 94%  Goal:  (reviewed 08/07/24) Over the next 30 days, the patient will not experience hospital readmission  Interventions:  (reviewed 08/07/24)  Falls Interventions: Reviewed medications and discussed potential side effects of medications such as dizziness and frequent urination Advised patient of importance of notifying provider of falls Assessed for falls since last encounter Assessed patients knowledge of fall risk prevention secondary to previously provided education Screening for signs and symptoms of depression related to chronic disease  state  Assessed social determinant of health barriers Use assistive device as needed Clear pathways, remove obstacles, throw rugs [facility]PT and OT by [facility] Take medication for RLS - 10/27 - Medication has been changed to Mirapex  with better results  COPD Interventions: (reviewed 08/07/24) Advised patient to track and manage COPD triggers Assessed social determinant of health barriers Discussed the importance of adequate rest and management of fatigue with COPD Provided education about and advised patient to utilize infection prevention strategies to reduce risk of respiratory infection Provided  instruction about proper use of medications used for management of COPD including inhalers Use of home oxygen  Do not smoke if using oxygen . Do not expose concentrator or tanks to heat. Leave the tanks upright Use Nicotine  patch as needed but do not smoke while using the patch    Patient Self Care Activities:   (reviewed 08/07/24) Attend all scheduled provider appointments Call pharmacy for medication refills 3-7 days in advance of running out of medications Call provider office for new concerns or questions  Notify RN Care Manager of [facility] call rescheduling needs Participate in Transition of Care Program/Attend TOC scheduled calls Perform all self care activities independently  Take medications as prescribed   Encouraged smoking cessation and/or reduction - The patient states he smokes between 2 and 2.5 packs a day  Plan:  Telephone follow up appointment with care management team member scheduled for:  Tuesday December 9th at 11:30am        Medford Balboa, BSN, RN [place]  VBCI - [facility] RN Care Manager [phone]

## 2024-08-08 ENCOUNTER — Ambulatory Visit: Admitting: Family

## 2024-08-08 ENCOUNTER — Encounter: Payer: Self-pay | Admitting: Family

## 2024-08-08 VITALS — BP 120/60 | HR 90 | Ht 68.0 in | Wt 151.8 lb

## 2024-08-08 DIAGNOSIS — I5032 Chronic diastolic (congestive) heart failure: Secondary | ICD-10-CM | POA: Diagnosis not present

## 2024-08-08 DIAGNOSIS — Z23 Encounter for immunization: Secondary | ICD-10-CM | POA: Diagnosis not present

## 2024-08-08 DIAGNOSIS — G2581 Restless legs syndrome: Secondary | ICD-10-CM

## 2024-08-08 DIAGNOSIS — J431 Panlobular emphysema: Secondary | ICD-10-CM

## 2024-08-08 DIAGNOSIS — I10 Essential (primary) hypertension: Secondary | ICD-10-CM

## 2024-08-08 NOTE — Progress Notes (Signed)
 "  Established Patient Office Visit  Subjective:  Patient ID: Daine Gunther, male    DOB: 1945/12/20  Age: 78 y.o. MRN: [id number]  Chief Complaint  Patient presents with   Follow-up    6 week follow up    Patient is here today for his 6 week follow up.  He has been feeling about the same since last appointment.   He does have additional concerns to discuss today.  He has been having some issues with his potassium levels, and the hospital providers think that the dosing he was on of his lasix  is too strong for him, but he is having continued issues with his swelling in his legs since stopping his lasix .  The hospital had asked him to start taking spironolactone , but he has not gotten this yet.    Labs are not due today.  He needs refills.   I have reviewed his active problem list, medication list, allergies, health maintenance, notes from last encounter, lab results for his appointment today.      No other concerns at this time.   Past Medical History:  Diagnosis Date   Acute respiratory failure with hypoxia (HCC) 01/28/2023   Anxiety    situational   Arthritis    BPH (benign prostatic hyperplasia)    CAD (coronary artery disease)    CHF (congestive heart failure) (HCC)    pt denies 11/13/2019   Chronic cardiopulmonary disease (HCC)    COPD (chronic obstructive pulmonary disease) (HCC)    History of kidney stones    Hyperlipidemia    Hypertension    Hypertension 12/28/2016   lung ca dx'd 03/2015   Mass of lung    Nicotine  dependence    Personal history of kidney stones    Restless leg    Shortness of breath dyspnea    with exertion   Tuberculosis    Wears glasses     Past Surgical History:  Procedure Laterality Date   BACK SURGERY     BIOPSY  01/25/2020   Procedure: BIOPSY;  Surgeon: Legrand Victory LITTIE DOUGLAS, MD;  Location: [facility];  Service: Gastroenterology;;   CAD CCTA 01/31/15     CERVICAL FUSION     COLONOSCOPY     COLONOSCOPY WITH PROPOFOL  N/A  01/25/2020   Procedure: COLONOSCOPY WITH PROPOFOL ;  Surgeon: Legrand Victory LITTIE DOUGLAS, MD;  Location: [facility];  Service: Gastroenterology;  Laterality: N/A;   ESOPHAGOGASTRODUODENOSCOPY (EGD) WITH PROPOFOL  N/A 01/25/2020   Procedure: ESOPHAGOGASTRODUODENOSCOPY (EGD) WITH PROPOFOL ;  Surgeon: Legrand Victory LITTIE DOUGLAS, MD;  Location: [facility];  Service: Gastroenterology;  Laterality: N/A;   FRACTURE SURGERY     left ankle   HEMOSTASIS CLIP PLACEMENT  01/25/2020   Procedure: HEMOSTASIS CLIP PLACEMENT;  Surgeon: Legrand Victory LITTIE DOUGLAS, MD;  Location: [facility];  Service: Gastroenterology;;   HOT HEMOSTASIS N/A 01/25/2020   Procedure: HOT HEMOSTASIS (ARGON PLASMA COAGULATION/BICAP);  Surgeon: Legrand Victory LITTIE DOUGLAS, MD;  Location: [facility];  Service: Gastroenterology;  Laterality: N/A;   LUNG BIOPSY N/A 02/27/2015   Procedure: LUNG BIOPSY;  Surgeon: Dallas KATHEE Jude, MD;  Location: [facility];  Service: Thoracic;  Laterality: N/A;   right inguinal hernia repair at age 67     VIDEO BRONCHOSCOPY WITH ENDOBRONCHIAL ULTRASOUND N/A 02/27/2015   Procedure: VIDEO BRONCHOSCOPY WITH ENDOBRONCHIAL ULTRASOUND;  Surgeon: Dallas KATHEE Jude, MD;  Location: [facility];  Service: Thoracic;  Laterality: N/A;    Social History   Socioeconomic History   Marital status: Divorced  Spouse name: Not on file   Number of children: Not on file   Years of education: Not on file   Highest education level: Not on file  Occupational History   Not on file  Tobacco Use   Smoking status: Every Day    Current packs/day: 2.50    Average packs/day: 2.5 packs/day for 56.5 years (141.2 ttl pk-yrs)    Types: Cigarettes    Start date: 02/14/1968    Passive exposure: Current   Smokeless tobacco: Never  Vaping Use   Vaping status: Former  Substance and Sexual Activity   Alcohol  use: Yes    Alcohol /week: 0.0 standard drinks of alcohol     Comment: ocassional   Drug use: No   Sexual activity: Yes  Other Topics Concern   Not on file  Social  History Narrative   Not on file   Social Drivers of Health   Financial Resource Strain: Low Risk  (01/30/2023)   Overall Financial Resource Strain (CARDIA)    Difficulty of Paying Living Expenses: Not very hard  Food Insecurity: No Food Insecurity (08/07/2024)   Hunger Vital Sign    Worried About Running Out of Food in the Last Year: Never true    Ran Out of Food in the Last Year: Never true  Transportation Needs: No Transportation Needs (08/07/2024)   PRAPARE - Administrator, Civil Service (Medical): No    Lack of Transportation (Non-Medical): No  Physical Activity: Not on file  Stress: Not on file  Social Connections: Patient Declined (06/13/2024)   Social Connection and Isolation Panel    Frequency of Communication with Friends and Family: Patient declined    Frequency of Social Gatherings with Friends and Family: Patient declined    Attends Religious Services: Patient declined    Database Administrator or Organizations: Patient declined    Attends Banker Meetings: Patient declined    Marital Status: Patient declined  Recent Concern: Social Connections - Socially Isolated (04/16/2024)   Social Connection and Isolation Panel    Frequency of Communication with Friends and Family: More than three times a week    Frequency of Social Gatherings with Friends and Family: Once a week    Attends Religious Services: Never    Database Administrator or Organizations: No    Attends Banker Meetings: Never    Marital Status: Divorced  Catering Manager Violence: Not At Risk (08/07/2024)   Humiliation, Afraid, Rape, and Kick questionnaire    Fear of Current or Ex-Partner: No    Emotionally Abused: No    Physically Abused: No    Sexually Abused: No    Family History  Problem Relation Age of Onset   Cancer Other    Heart disease Father    Lung cancer Father    Hypertension Other    Diabetes Mother    Valvular heart disease Mother    Diabetes Sister     Colon polyps Maternal Uncle    Diabetes Sister    Diabetes Sister    Diabetes Niece    Colon cancer Cousin    Esophageal cancer Neg Hx    Stomach cancer Neg Hx    Pancreatic cancer Neg Hx     No Known Allergies  Review of Systems  Respiratory:  Positive for cough, sputum production, shortness of breath and wheezing.   Cardiovascular:  Positive for leg swelling.  All other systems reviewed and are negative.      Objective:  BP 120/60   Pulse 90   Ht 5' 8 (1.727 m)   Wt 151 lb 12.8 oz (68.9 kg)   SpO2 (!) 87%   BMI 23.08 kg/m   Vitals:   08/08/24 1258  BP: 120/60  Pulse: 90  Height: 5' 8 (1.727 m)  Weight: 151 lb 12.8 oz (68.9 kg)  SpO2: (!) 87%  BMI (Calculated): 23.09    Physical Exam Vitals and nursing note reviewed.  Constitutional:      Appearance: Normal appearance. He is normal weight.  Eyes:     Pupils: Pupils are equal, round, and reactive to light.  Cardiovascular:     Rate and Rhythm: Normal rate and regular rhythm.     Pulses: Normal pulses.     Heart sounds: Normal heart sounds.  Pulmonary:     Effort: Pulmonary effort is normal. No respiratory distress.     Breath sounds: Wheezing and rhonchi present.  Musculoskeletal:        General: Normal range of motion.     Cervical back: Normal range of motion.  Neurological:     General: No focal deficit present.     Mental Status: He is alert and oriented to person, place, and time. Mental status is at baseline.  Psychiatric:        Mood and Affect: Mood normal.        Behavior: Behavior normal.      No results found for any visits on 08/08/24.  Recent Results (from the past 2160 hours)  Comprehensive metabolic panel     Status: Abnormal   Collection Time: 06/13/24 12:57 PM  Result Value Ref Range   Sodium 135 135 - 145 mmol/L   Potassium 4.2 3.5 - 5.1 mmol/L    Comment: HEMOLYSIS AT THIS LEVEL MAY AFFECT RESULT   Chloride 88 (L) 98 - 111 mmol/L   CO2 36 (H) 22 - 32 mmol/L    Glucose, Bld 103 (H) 70 - 99 mg/dL    Comment: Glucose reference range applies only to samples taken after fasting for at least 8 hours.   BUN 18 8 - 23 mg/dL   Creatinine, Ser 9.24 0.61 - 1.24 mg/dL   Calcium  8.8 (L) 8.9 - 10.3 mg/dL   Total Protein 6.7 6.5 - 8.1 g/dL   Albumin  3.6 3.5 - 5.0 g/dL   AST 47 (H) 15 - 41 U/L    Comment: HEMOLYSIS AT THIS LEVEL MAY AFFECT RESULT   ALT 26 0 - 44 U/L    Comment: HEMOLYSIS AT THIS LEVEL MAY AFFECT RESULT   Alkaline Phosphatase 105 38 - 126 U/L   Total Bilirubin 0.9 0.0 - 1.2 mg/dL    Comment: HEMOLYSIS AT THIS LEVEL MAY AFFECT RESULT   GFR, Estimated >60 >60 mL/min    Comment: (NOTE) Calculated using the CKD-EPI Creatinine Equation (2021)    Anion gap 11 5 - 15    Comment: Performed at [facility], [address] [place], [place] [address]  CBC     Status: Abnormal   Collection Time: 06/13/24 12:57 PM  Result Value Ref Range   WBC 7.6 4.0 - 10.5 K/uL   RBC 3.77 (L) 4.22 - 5.81 MIL/uL   Hemoglobin 8.8 (L) 13.0 - 17.0 g/dL    Comment: Reticulocyte Hemoglobin testing may be clinically indicated, consider ordering this additional test [id number]    HCT 29.1 (L) 39.0 - 52.0 %   MCV 77.2 (L) 80.0 - 100.0 fL   MCH 23.3 (L)  26.0 - 34.0 pg   MCHC 30.2 30.0 - 36.0 g/dL   RDW 83.9 (H) 88.4 - 84.4 %   Platelets 320 150 - 400 K/uL   nRBC 0.0 0.0 - 0.2 %    Comment: Performed at [facility], [address] [place], [place] [address]  Brain natriuretic peptide     Status: None   Collection Time: 06/13/24 12:57 PM  Result Value Ref Range   B Natriuretic Peptide 52.0 0.0 - 100.0 pg/mL    Comment: Performed at [facility], [address] [place], [place] [address]  Procalcitonin     Status: None   Collection Time: 06/13/24 12:57 PM  Result Value Ref Range   Procalcitonin <0.10 ng/mL    Comment:        Interpretation: PCT (Procalcitonin) <= 0.5 ng/mL: Systemic infection (sepsis) is not likely. Local  bacterial infection is possible. (NOTE)       Sepsis PCT Algorithm           Lower Respiratory Tract                                      Infection PCT Algorithm    ----------------------------     ----------------------------         PCT < 0.25 ng/mL                PCT < 0.10 ng/mL          Strongly encourage             Strongly discourage   discontinuation of antibiotics    initiation of antibiotics    ----------------------------     -----------------------------       PCT 0.25 - 0.50 ng/mL            PCT 0.10 - 0.25 ng/mL               OR       >80% decrease in PCT            Discourage initiation of                                            antibiotics      Encourage discontinuation           of antibiotics    ----------------------------     -----------------------------         PCT >= 0.50 ng/mL              PCT 0.26 - 0.50 ng/mL               AND        <80% decrease in PCT             Encourage initiation of                                             antibiotics       Encourage continuation           of antibiotics    ----------------------------     -----------------------------        PCT >= 0.50 ng/mL  PCT > 0.50 ng/mL               AND         increase in PCT                  Strongly encourage                                      initiation of antibiotics    Strongly encourage escalation           of antibiotics                                     -----------------------------                                           PCT <= 0.25 ng/mL                                                 OR                                        > 80% decrease in PCT                                      Discontinue / Do not initiate                                             antibiotics  Performed at [facility], [address] [place], [place] [address]   TSH     Status: None   Collection Time: 06/13/24 12:57 PM  Result Value Ref Range   TSH 0.351  0.350 - 4.500 uIU/mL    Comment: Performed by a 3rd Generation assay with a functional sensitivity of <=0.01 uIU/mL. Performed at [facility], [address] [place], [place] [address]   Iron  and TIBC     Status: Abnormal   Collection Time: 06/13/24 12:57 PM  Result Value Ref Range   Iron  30 (L) 45 - 182 ug/dL    Comment: HEMOLYSIS AT THIS LEVEL MAY AFFECT RESULT   TIBC 371 250 - 450 ug/dL   Saturation Ratios 8 (L) 17.9 - 39.5 %   UIBC 341 ug/dL    Comment: Performed at [facility], [address], [place], [place] [address]  Ferritin     Status: None   Collection Time: 06/13/24 12:57 PM  Result Value Ref Range   Ferritin 48 24 - 336 ng/mL    Comment: Performed at [facility], [address] [place], [place] [address]  Reticulocytes     Status: Abnormal   Collection Time: 06/13/24 12:57 PM  Result Value Ref Range   Retic Ct Pct 2.2 0.4 - 3.1 %  RBC. 3.77 (L) 4.22 - 5.81 MIL/uL   Retic Count, Absolute 82.9 19.0 - 186.0 K/uL   Immature Retic Fract 26.1 (H) 2.3 - 15.9 %    Comment: Performed at [facility], [address] [place], [place] [address]  CK     Status: Abnormal   Collection Time: 06/13/24 12:57 PM  Result Value Ref Range   Total CK 848 (H) 49 - 397 U/L    Comment: HEMOLYSIS AT THIS LEVEL MAY AFFECT RESULT Performed at [facility], [address], [place], [place] [address]   Resp panel by RT-PCR (RSV, Flu A&B, Covid) Anterior Nasal Swab     Status: None   Collection Time: 06/13/24  1:32 PM   Specimen: Anterior Nasal Swab  Result Value Ref Range   SARS Coronavirus 2 by RT PCR NEGATIVE NEGATIVE    Comment: (NOTE) SARS-CoV-2 target nucleic acids are NOT DETECTED.  The SARS-CoV-2 RNA is generally detectable in upper respiratory specimens during the acute phase of infection. The lowest concentration of SARS-CoV-2 viral copies this assay can detect is 138 copies/mL. A negative result does not preclude  SARS-Cov-2 infection and should not be used as the sole basis for treatment or other patient management decisions. A negative result may occur with  improper specimen collection/handling, submission of specimen other than nasopharyngeal swab, presence of viral mutation(s) within the areas targeted by this assay, and inadequate number of viral copies(<138 copies/mL). A negative result must be combined with clinical observations, patient history, and epidemiological information. The expected result is Negative.  Fact Sheet for Patients:  bloggercourse.com  Fact Sheet for Healthcare Providers:  seriousbroker.it  This test is no t yet approved or cleared by the [place]  FDA and  has been authorized for detection and/or diagnosis of SARS-CoV-2 by FDA under an Emergency Use Authorization (EUA). This EUA will remain  in effect (meaning this test can be used) for the duration of the COVID-19 declaration under Section 564(b)(1) of the Act, 21 U.S.C.section 360bbb-3(b)(1), unless the authorization is terminated  or revoked sooner.       Influenza A by PCR NEGATIVE NEGATIVE   Influenza B by PCR NEGATIVE NEGATIVE    Comment: (NOTE) The Xpert Xpress SARS-CoV-2/FLU/RSV plus assay is intended as an aid in the diagnosis of influenza from Nasopharyngeal swab specimens and should not be used as a sole basis for treatment. Nasal washings and aspirates are unacceptable for Xpert Xpress SARS-CoV-2/FLU/RSV testing.  Fact Sheet for Patients: bloggercourse.com  Fact Sheet for Healthcare Providers: seriousbroker.it  This test is not yet approved or cleared by the [place]  FDA and has been authorized for detection and/or diagnosis of SARS-CoV-2 by FDA under an Emergency Use Authorization (EUA). This EUA will remain in effect (meaning this test can be used) for the duration of the COVID-19  declaration under Section 564(b)(1) of the Act, 21 U.S.C. section 360bbb-3(b)(1), unless the authorization is terminated or revoked.     Resp Syncytial Virus by PCR NEGATIVE NEGATIVE    Comment: (NOTE) Fact Sheet for Patients: bloggercourse.com  Fact Sheet for Healthcare Providers: seriousbroker.it  This test is not yet approved or cleared by the [place]  FDA and has been authorized for detection and/or diagnosis of SARS-CoV-2 by FDA under an Emergency Use Authorization (EUA). This EUA will remain in effect (meaning this test can be used) for the duration of the COVID-19 declaration under Section 564(b)(1) of the Act, 21 U.S.C. section 360bbb-3(b)(1), unless the authorization is terminated or revoked.  Performed at [facility], [address] [place], [place] [address]   Blood gas, venous     Status: Abnormal   Collection Time: 06/13/24  2:47 PM  Result Value Ref Range   pH, Ven 7.38 7.25 - 7.43   pCO2, Ven 73 (HH) 44 - 60 mmHg    Comment: CRITICAL RESULT CALLED TO, READ BACK BY AND VERIFIED WITH:  LOGAN RIGGINS RN @ 1456 ON 06/13/2024 RE    pO2, Ven 84 (H) 32 - 45 mmHg   Bicarbonate 43.2 (H) 20.0 - 28.0 mmol/L   Acid-Base Excess 14.5 (H) 0.0 - 2.0 mmol/L   O2 Saturation 98.2 %   Patient temperature 37.0    Collection site VEIN     Comment: Performed at [facility], [address] [place], [place] [address]  Urinalysis, Routine w reflex microscopic -Urine, Clean Catch     Status: Abnormal   Collection Time: 06/13/24  3:39 PM  Result Value Ref Range   Color, Urine YELLOW (A) YELLOW   APPearance CLEAR (A) CLEAR   Specific Gravity, Urine 1.010 1.005 - 1.030   pH 7.0 5.0 - 8.0   Glucose, UA NEGATIVE NEGATIVE mg/dL   Hgb urine dipstick NEGATIVE NEGATIVE   Bilirubin Urine NEGATIVE NEGATIVE   Ketones, ur NEGATIVE NEGATIVE mg/dL   Protein, ur NEGATIVE NEGATIVE mg/dL   Nitrite NEGATIVE NEGATIVE    Leukocytes,Ua NEGATIVE NEGATIVE    Comment: Performed at [facility], [address] [place], [place] [address]  Basic metabolic panel     Status: Abnormal   Collection Time: 06/14/24  3:48 AM  Result Value Ref Range   Sodium 136 135 - 145 mmol/L   Potassium 3.8 3.5 - 5.1 mmol/L   Chloride 89 (L) 98 - 111 mmol/L   CO2 36 (H) 22 - 32 mmol/L   Glucose, Bld 112 (H) 70 - 99 mg/dL    Comment: Glucose reference range applies only to samples taken after fasting for at least 8 hours.   BUN 19 8 - 23 mg/dL   Creatinine, Ser 8.93 0.61 - 1.24 mg/dL   Calcium  8.8 (L) 8.9 - 10.3 mg/dL   GFR, Estimated >39 >39 mL/min    Comment: (NOTE) Calculated using the CKD-EPI Creatinine Equation (2021)    Anion gap 11 5 - 15    Comment: Performed at [facility], [address] [place], [place] [address]  CBC     Status: Abnormal   Collection Time: 06/14/24  3:48 AM  Result Value Ref Range   WBC 7.5 4.0 - 10.5 K/uL   RBC 3.70 (L) 4.22 - 5.81 MIL/uL   Hemoglobin 8.6 (L) 13.0 - 17.0 g/dL   HCT 71.0 (L) 60.9 - 47.9 %   MCV 78.1 (L) 80.0 - 100.0 fL   MCH 23.2 (L) 26.0 - 34.0 pg   MCHC 29.8 (L) 30.0 - 36.0 g/dL   RDW 84.0 (H) 88.4 - 84.4 %   Platelets 316 150 - 400 K/uL   nRBC 0.0 0.0 - 0.2 %    Comment: Performed at [facility], [address] [place], [place] [address]  CK     Status: Abnormal   Collection Time: 06/14/24  3:48 AM  Result Value Ref Range   Total CK 866 (H) 49 - 397 U/L    Comment: Performed at [facility], [address], [place], [place] [address]  Basic metabolic panel with GFR     Status: Abnormal  Collection Time: 06/15/24  4:36 AM  Result Value Ref Range   Sodium 137 135 - 145 mmol/L   Potassium 4.0 3.5 - 5.1 mmol/L   Chloride 98 98 - 111 mmol/L   CO2 33 (H) 22 - 32 mmol/L   Glucose, Bld 74 70 - 99 mg/dL    Comment: Glucose reference range applies only to samples taken after fasting for at least 8 hours.   BUN 18 8 - 23 mg/dL    Creatinine, Ser 9.10 0.61 - 1.24 mg/dL   Calcium  8.5 (L) 8.9 - 10.3 mg/dL   GFR, Estimated >39 >39 mL/min    Comment: (NOTE) Calculated using the CKD-EPI Creatinine Equation (2021)    Anion gap 6 5 - 15    Comment: Performed at [facility], [address] [place], [place] [address]  Magnesium      Status: Abnormal   Collection Time: 06/15/24  4:36 AM  Result Value Ref Range   Magnesium  2.6 (H) 1.7 - 2.4 mg/dL    Comment: Performed at [facility], [address] [place], [place] [address]  CK     Status: Abnormal   Collection Time: 06/15/24  4:36 AM  Result Value Ref Range   Total CK 810 (H) 49 - 397 U/L    Comment: Performed at [facility], [address] [place], [place] [address]  Glucose, capillary     Status: None   Collection Time: 06/15/24  9:00 AM  Result Value Ref Range   Glucose-Capillary 92 70 - 99 mg/dL    Comment: Glucose reference range applies only to samples taken after fasting for at least 8 hours.  Ammonia     Status: None   Collection Time: 06/15/24  3:22 PM  Result Value Ref Range   Ammonia 22 9 - 35 umol/L    Comment: Performed at [facility], [address] [place], [place] [address]  CK     Status: None   Collection Time: 06/16/24 11:38 AM  Result Value Ref Range   Total CK 308 49 - 397 U/L    Comment: Performed at [facility], [address] [place], [place] [address]  Comprehensive metabolic panel     Status: Abnormal   Collection Time: 07/30/24  1:53 PM  Result Value Ref Range   Sodium 144 135 - 145 mmol/L   Potassium 4.1 3.5 - 5.1 mmol/L   Chloride 95 (L) 98 - 111 mmol/L   CO2 43 (H) 22 - 32 mmol/L   Glucose, Bld 119 (H) 70 - 99 mg/dL    Comment: Glucose reference range applies only to samples taken after fasting for at least 8 hours.   BUN 17 8 - 23 mg/dL   Creatinine, Ser 9.29 0.61 - 1.24 mg/dL   Calcium  9.0 8.9 - 10.3 mg/dL   Total Protein 6.3 (L) 6.5 - 8.1 g/dL   Albumin  3.8 3.5 -  5.0 g/dL   AST 20 15 - 41 U/L   ALT 12 0 - 44 U/L   Alkaline Phosphatase 109 38 - 126 U/L   Total Bilirubin 0.3 0.0 - 1.2 mg/dL   GFR, Estimated >39 >39 mL/min    Comment: (NOTE) Calculated using the CKD-EPI Creatinine Equation (2021)    Anion gap 5 5 - 15    Comment: Performed at [facility], [address], [place], [place] [address]  Troponin T, High Sensitivity     Status: Abnormal   Collection Time: 07/30/24  1:53 PM  Result Value Ref Range   Troponin T High Sensitivity 74 (H) 0 - 19 ng/L    Comment: (NOTE) Biotin concentrations > 1000 ng/mL falsely decrease TnT results.  Serial cardiac troponin measurements are suggested.  Refer to the Links section for chest pain algorithms and additional  guidance. Performed at [facility], [address] [place], [place] [address]   CBC with Differential     Status: Abnormal   Collection Time: 07/30/24  1:53 PM  Result Value Ref Range   WBC 7.2 4.0 - 10.5 K/uL   RBC 4.36 4.22 - 5.81 MIL/uL   Hemoglobin 10.2 (L) 13.0 - 17.0 g/dL   HCT 63.5 (L) 60.9 - 47.9 %   MCV 83.5 80.0 - 100.0 fL   MCH 23.4 (L) 26.0 - 34.0 pg   MCHC 28.0 (L) 30.0 - 36.0 g/dL   RDW 82.1 (H) 88.4 - 84.4 %   Platelets 265 150 - 400 K/uL   nRBC 0.0 0.0 - 0.2 %   Neutrophils Relative % 72 %   Neutro Abs 5.2 1.7 - 7.7 K/uL   Lymphocytes Relative 18 %   Lymphs Abs 1.3 0.7 - 4.0 K/uL   Monocytes Relative 9 %   Monocytes Absolute 0.7 0.1 - 1.0 K/uL   Eosinophils Relative 1 %   Eosinophils Absolute 0.0 0.0 - 0.5 K/uL   Basophils Relative 0 %   Basophils Absolute 0.0 0.0 - 0.1 K/uL   Immature Granulocytes 0 %   Abs Immature Granulocytes 0.01 0.00 - 0.07 K/uL    Comment: Performed at [facility], [address] [place], [place] [address]  Pro Brain natriuretic peptide     Status: None   Collection Time: 07/30/24  1:53 PM  Result Value Ref Range   Pro Brain Natriuretic Peptide 64.0 <300.0 pg/mL    Comment: (NOTE) Age Group         Cut-Points    Interpretation  < 50 years     450 pg/mL       NT-proBNP > 450 pg/mL indicates                                ADHF is likely              50 to 75 years  900 pg/mL      NT-proBNP > 900 pg/mL indicates          ADHF is likely  > 75 years      1800 pg/mL     NT-proBNP > 1800 pg/mL indicates          ADHF is likely                           All ages    Results between       Indeterminate. Further clinical             300 and the cut-   information is needed to determine            point for age group   if ADHF is present.  Elecsys proBNP II/ Elecsys proBNP II STAT           Cut-Point                       Interpretation  300 pg/mL                    NT-proBNP <300pg/mL indicates                             ADHF is not likely  Performed at [facility], [address] [place], [place] [address]   Blood gas, venous     Status: Abnormal   Collection Time: 07/30/24  1:53 PM  Result Value Ref Range   pH, Ven 7.28 7.25 - 7.43   pCO2, Ven 108 (HH) 44 - 60 mmHg    Comment: CRITICAL RESULT CALLED TO, READ BACK BY AND VERIFIED WITH: CRITICAL VALUE 07/30/24,1417,DR. EVAN BREADLER/FD    pO2, Ven 61 (H) 32 - 45 mmHg   Bicarbonate 50.7 (H) 20.0 - 28.0 mmol/L   Acid-Base Excess 18.3 (H) 0.0 - 2.0 mmol/L   O2 Saturation 91.6 %   Patient temperature 37.0    Collection site VEIN     Comment: Performed at [facility], [address] [place], [place] [address]  Resp panel by RT-PCR (RSV, Flu A&B, Covid) Anterior Nasal Swab     Status: None   Collection Time: 07/30/24  3:48 PM   Specimen: Anterior Nasal Swab  Result Value Ref Range   SARS Coronavirus 2 by RT PCR NEGATIVE NEGATIVE    Comment: (NOTE) SARS-CoV-2 target nucleic acids are NOT DETECTED.  The SARS-CoV-2 RNA is generally detectable in upper respiratory specimens during the acute phase of infection. The lowest concentration of  SARS-CoV-2 viral copies this assay can detect is 138 copies/mL. A negative result does not preclude SARS-Cov-2 infection and should not be used as the sole basis for treatment or other patient management decisions. A negative result may occur with  improper specimen collection/handling, submission of specimen other than nasopharyngeal swab, presence of viral mutation(s) within the areas targeted by this assay, and inadequate number of viral copies(<138 copies/mL). A negative result must be combined with clinical observations, patient history, and epidemiological information. The expected result is Negative.  Fact Sheet for Patients:  bloggercourse.com  Fact Sheet for Healthcare Providers:  seriousbroker.it  This test is no t yet approved or cleared by the [place]  FDA and  has been authorized for detection and/or diagnosis of SARS-CoV-2 by FDA under an Emergency Use Authorization (EUA). This EUA will remain  in effect (meaning this test can be used) for the duration of the COVID-19 declaration under Section 564(b)(1) of the Act, 21 U.S.C.section 360bbb-3(b)(1), unless the authorization is terminated  or revoked sooner.       Influenza A by PCR NEGATIVE NEGATIVE   Influenza B by PCR NEGATIVE NEGATIVE    Comment: (NOTE) The Xpert Xpress SARS-CoV-2/FLU/RSV plus assay is intended as an aid in the diagnosis of influenza from Nasopharyngeal swab specimens and should not be used as a sole basis for treatment. Nasal washings and aspirates are unacceptable for Xpert Xpress SARS-CoV-2/FLU/RSV testing.  Fact Sheet for Patients: bloggercourse.com  Fact Sheet for Healthcare Providers: seriousbroker.it  This test is not yet approved or cleared by the [place]  FDA and has been authorized for detection and/or diagnosis of SARS-CoV-2 by FDA under an  Emergency Use Authorization (EUA).  This EUA will remain in effect (meaning this test can be used) for the duration of the COVID-19 declaration under Section 564(b)(1) of the Act, 21 U.S.C. section 360bbb-3(b)(1), unless the authorization is terminated or revoked.     Resp Syncytial Virus by PCR NEGATIVE NEGATIVE    Comment: (NOTE) Fact Sheet for Patients: bloggercourse.com  Fact Sheet for Healthcare Providers: seriousbroker.it  This test is not yet approved or cleared by the [place]  FDA and has been authorized for detection and/or diagnosis of SARS-CoV-2 by FDA under an Emergency Use Authorization (EUA). This EUA will remain in effect (meaning this test can be used) for the duration of the COVID-19 declaration under Section 564(b)(1) of the Act, 21 U.S.C. section 360bbb-3(b)(1), unless the authorization is terminated or revoked.  Performed at [facility], [address] [place], [place] [address]   Troponin T, High Sensitivity     Status: Abnormal   Collection Time: 07/30/24  5:24 PM  Result Value Ref Range   Troponin T High Sensitivity 58 (H) 0 - 19 ng/L    Comment: (NOTE) Biotin concentrations > 1000 ng/mL falsely decrease TnT results.  Serial cardiac troponin measurements are suggested.  Refer to the Links section for chest pain algorithms and additional  guidance. Performed at [facility], [address] [place], [place] [address]   Troponin T, High Sensitivity     Status: Abnormal   Collection Time: 07/30/24  6:55 PM  Result Value Ref Range   Troponin T High Sensitivity 65 (H) 0 - 19 ng/L    Comment: (NOTE) Biotin concentrations > 1000 ng/mL falsely decrease TnT results.  Serial cardiac troponin measurements are suggested.  Refer to the Links section for chest pain algorithms and additional  guidance. Performed at [facility], [address] [place], [place] [address]   Troponin T, High Sensitivity      Status: Abnormal   Collection Time: 07/30/24  9:35 PM  Result Value Ref Range   Troponin T High Sensitivity 67 (H) 0 - 19 ng/L    Comment: (NOTE) Biotin concentrations > 1000 ng/mL falsely decrease TnT results.  Serial cardiac troponin measurements are suggested.  Refer to the Links section for chest pain algorithms and additional  guidance. Performed at [facility], [address] [place], [place] [address]   Comprehensive metabolic panel     Status: Abnormal   Collection Time: 07/31/24  3:00 AM  Result Value Ref Range   Sodium 143 135 - 145 mmol/L   Potassium 4.3 3.5 - 5.1 mmol/L   Chloride 97 (L) 98 - 111 mmol/L   CO2 41 (H) 22 - 32 mmol/L   Glucose, Bld 116 (H) 70 - 99 mg/dL    Comment: Glucose reference range applies only to samples taken after fasting for at least 8 hours.   BUN 19 8 - 23 mg/dL   Creatinine, Ser 9.29 0.61 - 1.24 mg/dL   Calcium  9.0 8.9 - 10.3 mg/dL   Total Protein 5.8 (L) 6.5 - 8.1 g/dL   Albumin  3.5 3.5 - 5.0 g/dL   AST 15 15 - 41 U/L   ALT 9 0 - 44 U/L   Alkaline Phosphatase 95 38 - 126 U/L   Total Bilirubin <0.2 0.0 - 1.2 mg/dL   GFR, Estimated >39 >39 mL/min    Comment: (NOTE) Calculated using the CKD-EPI Creatinine Equation (2021)    Anion gap 5 5 - 15    Comment: Performed  at [facility], [address] [place], [place] [address]  CBC     Status: Abnormal   Collection Time: 07/31/24  3:00 AM  Result Value Ref Range   WBC 2.6 (L) 4.0 - 10.5 K/uL   RBC 3.75 (L) 4.22 - 5.81 MIL/uL   Hemoglobin 8.8 (L) 13.0 - 17.0 g/dL   HCT 69.9 (L) 60.9 - 47.9 %   MCV 80.0 80.0 - 100.0 fL   MCH 23.5 (L) 26.0 - 34.0 pg   MCHC 29.3 (L) 30.0 - 36.0 g/dL   RDW 81.9 (H) 88.4 - 84.4 %   Platelets 252 150 - 400 K/uL   nRBC 0.0 0.0 - 0.2 %    Comment: Performed at [facility], [address] [place], [place] [address]  CBC with Differential/Platelet     Status: Abnormal   Collection Time: 08/01/24  3:35 AM  Result Value Ref  Range   WBC 7.5 4.0 - 10.5 K/uL   RBC 3.92 (L) 4.22 - 5.81 MIL/uL   Hemoglobin 9.1 (L) 13.0 - 17.0 g/dL   HCT 68.6 (L) 60.9 - 47.9 %   MCV 79.8 (L) 80.0 - 100.0 fL   MCH 23.2 (L) 26.0 - 34.0 pg   MCHC 29.1 (L) 30.0 - 36.0 g/dL   RDW 81.9 (H) 88.4 - 84.4 %   Platelets 243 150 - 400 K/uL   nRBC 0.0 0.0 - 0.2 %   Neutrophils Relative % 74 %   Neutro Abs 5.6 1.7 - 7.7 K/uL   Lymphocytes Relative 12 %   Lymphs Abs 0.9 0.7 - 4.0 K/uL   Monocytes Relative 14 %   Monocytes Absolute 1.0 0.1 - 1.0 K/uL   Eosinophils Relative 0 %   Eosinophils Absolute 0.0 0.0 - 0.5 K/uL   Basophils Relative 0 %   Basophils Absolute 0.0 0.0 - 0.1 K/uL   Immature Granulocytes 0 %   Abs Immature Granulocytes 0.03 0.00 - 0.07 K/uL    Comment: Performed at [facility], [address] [place] [address] [place], [place] [address]  Basic metabolic panel     Status: Abnormal   Collection Time: 08/01/24  3:35 AM  Result Value Ref Range   Sodium 141 135 - 145 mmol/L   Potassium 3.9 3.5 - 5.1 mmol/L   Chloride 95 (L) 98 - 111 mmol/L   CO2 40 (H) 22 - 32 mmol/L   Glucose, Bld 107 (H) 70 - 99 mg/dL    Comment: Glucose reference range applies only to samples taken after fasting for at least 8 hours.   BUN 22 8 - 23 mg/dL   Creatinine, Ser 9.18 0.61 - 1.24 mg/dL   Calcium  8.7 (L) 8.9 - 10.3 mg/dL   GFR, Estimated >39 >39 mL/min    Comment: (NOTE) Calculated using the CKD-EPI Creatinine Equation (2021)    Anion gap 6 5 - 15    Comment: Performed at [facility], [address] [place], [place] [address]  CBC with Differential/Platelet     Status: Abnormal   Collection Time: 08/02/24  3:11 AM  Result Value Ref Range   WBC 5.8 4.0 - 10.5 K/uL   RBC 3.90 (L) 4.22 - 5.81 MIL/uL   Hemoglobin 9.1 (L) 13.0 - 17.0 g/dL   HCT 69.4 (L) 60.9 - 47.9 %   MCV 78.2 (L) 80.0 - 100.0 fL   MCH 23.3 (L) 26.0 - 34.0 pg   MCHC 29.8 (L) 30.0 - 36.0 g/dL   RDW 82.5 (H)  11.5 - 15.5 %   Platelets 213 150 - 400 K/uL   nRBC 0.0  0.0 - 0.2 %   Neutrophils Relative % 69 %   Neutro Abs 4.0 1.7 - 7.7 K/uL   Lymphocytes Relative 17 %   Lymphs Abs 1.0 0.7 - 4.0 K/uL   Monocytes Relative 14 %   Monocytes Absolute 0.8 0.1 - 1.0 K/uL   Eosinophils Relative 0 %   Eosinophils Absolute 0.0 0.0 - 0.5 K/uL   Basophils Relative 0 %   Basophils Absolute 0.0 0.0 - 0.1 K/uL   Immature Granulocytes 0 %   Abs Immature Granulocytes 0.02 0.00 - 0.07 K/uL    Comment: Performed at [facility], [address], [place], [place] [address]  Basic metabolic panel     Status: Abnormal   Collection Time: 08/02/24  3:11 AM  Result Value Ref Range   Sodium 139 135 - 145 mmol/L   Potassium 4.1 3.5 - 5.1 mmol/L   Chloride 96 (L) 98 - 111 mmol/L   CO2 37 (H) 22 - 32 mmol/L   Glucose, Bld 89 70 - 99 mg/dL    Comment: Glucose reference range applies only to samples taken after fasting for at least 8 hours.   BUN 19 8 - 23 mg/dL   Creatinine, Ser 9.30 0.61 - 1.24 mg/dL   Calcium  8.3 (L) 8.9 - 10.3 mg/dL   GFR, Estimated >39 >39 mL/min    Comment: (NOTE) Calculated using the CKD-EPI Creatinine Equation (2021)    Anion gap 6 5 - 15    Comment: Performed at [facility], [address], [place], [place] [address]       Assessment & Plan Needs flu shot Flu vaccine given in office today.  Pt advised to manage side effects with supportive measures.   Chronic diastolic CHF (congestive heart failure) (HCC) Patient is seen by cardiology, who manage this condition.  He is well controlled with current therapy.   Will defer to them for further changes to plan of care.  Panlobular emphysema (HCC) Patient is seen by pulmonary, who manage this condition.  He is well controlled with current therapy.   Will defer to them for further changes to plan of care.  Restless leg syndrome The medication we switched him to at his last appointment does seem to be managing his symptoms more effectively.  Will send a refill for this.    Essential hypertension Patient stable.  Well controlled with current therapy.   Continue current meds.      No follow-ups on file.   Total time spent: 20 minutes  ALAN CHRISTELLA ARRANT, FNP  08/08/2024   This document may have been prepared by [facility] Voice Recognition software and as such may include unintentional dictation errors.  "

## 2024-08-08 NOTE — Patient Instructions (Addendum)
 Phone extensions:   Upstairs Nurse [place]) : [phone] Upstairs [address] [place]) - [phone]    Start spironolactone  for daily fluid pill.   May take furosemide  every other day, MWF, as needed if still swelling.   I am going to get a pump for his legs to help with the swelling, would like him to have this on twice daily for at least 30 minutes at a time.    Need pneumonia vaccine (Prevnar 20), and RSV (Arexvy).  These are available at the pharmacy.

## 2024-08-09 ENCOUNTER — Telehealth: Payer: Self-pay

## 2024-08-09 NOTE — Telephone Encounter (Signed)
 [facility] OT called to inform that pt missed a therapy appointment on 08/04/2024.

## 2024-08-10 ENCOUNTER — Telehealth: Payer: Self-pay | Admitting: Family

## 2024-08-10 NOTE — Telephone Encounter (Signed)
 Leita, PT with [facility], left VM requesting verbal orders for PT for 1w5.   Per Henry schein, this is fine.   Spoke to Flasher and gave verbal orders.

## 2024-08-11 MED ORDER — SPIRONOLACTONE 25 MG PO TABS: 25.0000 mg | ORAL_TABLET | Freq: Every day | ORAL | 1 refills | Status: AC

## 2024-08-15 ENCOUNTER — Other Ambulatory Visit: Payer: Self-pay

## 2024-08-15 NOTE — Transitions of Care (Post Inpatient/ED Visit) (Signed)
 Transition of Care week 2  Visit Note  08/15/2024  Name: Colton Nguyen MRN: [id number]          DOB: 22-Oct-1945  Situation: Patient enrolled in [facility] 30-day program. Visit completed with Arley Goldberg by telephone.   Background:   Initial Transition Care Management Follow-up Telephone Call Discharge Date and Diagnosis: 08/02/24, Fall; COPD exacerbation   Past Medical History:  Diagnosis Date   Acute respiratory failure with hypoxia (HCC) 01/28/2023   Anxiety    situational   Arthritis    BPH (benign prostatic hyperplasia)    CAD (coronary artery disease)    CHF (congestive heart failure) (HCC)    pt denies 11/13/2019   Chronic cardiopulmonary disease (HCC)    COPD (chronic obstructive pulmonary disease) (HCC)    History of kidney stones    Hyperlipidemia    Hypertension    Hypertension 12/28/2016   lung ca dx'd 03/2015   Mass of lung    Nicotine  dependence    Personal history of kidney stones    Restless leg    Shortness of breath dyspnea    with exertion   Tuberculosis    Wears glasses     Assessment: Patient Reported Symptoms: Cognitive Cognitive Status: Alert and oriented to person, place, and time, Struggling with memory recall, Requires Assistance Decision Making      Neurological Neurological Review of Symptoms: Other: [place] Neurological Symptoms/Conditions [RPT]: RLS Neurological Comment: Neurology appointment on 08/17/24  HEENT HEENT Symptoms Reported: No symptoms reported      Cardiovascular Cardiovascular Symptoms Reported: No symptoms reported Does patient have uncontrolled Hypertension?: No Cardiovascular Management Strategies: Medication therapy  Respiratory Respiratory Symptoms Reported: Dry cough Additional Respiratory Details: The patient continues to smoke. Wears oxygen  at 2l/min Respiratory Management Strategies: Oxygen  therapy, Routine screening, Medication therapy, Coping strategies, Activity  Endocrine Endocrine Symptoms Reported: No  symptoms reported Is patient diabetic?: No    Gastrointestinal Gastrointestinal Symptoms Reported: No symptoms reported      Genitourinary Genitourinary Symptoms Reported: Incontinence Genitourinary Management Strategies: Incontinence garment/pad  Integumentary Integumentary Symptoms Reported: No symptoms reported    Musculoskeletal Musculoskelatal Symptoms Reviewed: Unsteady gait, Limited mobility, Weakness Additional Musculoskeletal Details: History of fall Musculoskeletal Comment: The patient uses a rolling walker. HHPT once a week   Patient at Risk for Falls Due to: History of fall(s)  Psychosocial Psychosocial Symptoms Reported: No symptoms reported         There were no vitals filed for this visit.    Medications Reviewed Today     Reviewed by Moises Reusing, RN (Case Manager) on 08/15/24 at 1129  Med List Status: <None>   Medication Order Taking? Sig Documenting Provider Last Dose Status Informant  acetaminophen  (TYLENOL ) 500 MG tablet [id number]  Take 1,000 mg by mouth every 6 (six) hours as needed for moderate pain or headache. [provider]  Active Pharmacy Records, Family Member  albuterol  (VENTOLIN  HFA) [phone] Base) MCG/ACT inhaler [id number]  Inhale 1-2 puffs into the lungs every 4 (four) hours as needed for wheezing or shortness of breath. Lenon Marien CROME, MD  Active Pharmacy Records, Family Member  aspirin  81 MG tablet [id number]  Take 81 mg by mouth daily. [provider]  Active Pharmacy Records, Family Member           Med Note DAWN, [facility] R   Sat Dec 28, 2015  4:48 AM) .  atorvastatin  (LIPITOR) 40 MG tablet [id number]  Take 1 tablet (40 mg total) by mouth daily. Orlean,  Alan HERO, FNP  Active Pharmacy Records, Family Member  azelastine  (ASTELIN ) 0.1 % nasal spray [id number]  Place 1 spray into both nostrils 2 (two) times daily. [name], FNP  Active   budesonide -glycopyrrolate -formoterol  (BREZTRI ) 160-9-4.8 MCG/ACT AERO inhaler  [id number]  Inhale 2 puffs into the lungs 2 (two) times daily. Jerelene Critchley, MD  Active   buPROPion  (WELLBUTRIN  SR) 150 MG 12 hr tablet [id number]  Take 1 tablet (150 mg total) by mouth 2 (two) times daily. Jerelene Critchley, MD  Active   Cholecalciferol  (VITAMIN D3) 5000 UNITS TABS [id number]  Take 5,000 Units by mouth daily. [provider]  Active Pharmacy Records, Family Member  cyclobenzaprine  (FLEXERIL ) 5 MG tablet [id number]  TAKE ONE TABLET (5 MG TOTAL) BY MOUTH THREE TIMES DAILY AS NEEDED FOR MUSCLE SPASMS. [name], FNP  Active Pharmacy Records, Family Member  dapagliflozin  propanediol (FARXIGA ) 10 MG TABS tablet [id number]  Take 1 tablet (10 mg total) by mouth daily before breakfast. Jerelene Critchley, MD  Active   ferrous sulfate  325 (65 FE) MG tablet [id number]  Take 1 tablet (325 mg total) by mouth daily with breakfast. [name], FNP  Active Pharmacy Records, Family Member  fluticasone  (FLONASE ) 50 MCG/ACT nasal spray [id number]  Place 2 sprays into both nostrils daily as needed for allergies.  [provider]  Active Pharmacy Records, Family Member           Med Note JERALYN, FLORIDA A   Fri Nov 03, 2019  3:16 PM)    furosemide  (LASIX ) 40 MG tablet [id number]  Take 1 tablet (40 mg total) by mouth every Monday, Wednesday, and Friday. [name], FNP  Active   ipratropium-albuterol  (DUONEB) 0.5-2.5 (3) MG/3ML SOLN [id number]  Take 3 mLs by nebulization every 6 (six) hours as needed. Jerelene Critchley, MD  Active   losartan  (COZAAR ) 50 MG tablet [id number]  Take 1 tablet (50 mg total) by mouth daily. [name], FNP  Active Pharmacy Records, Family Member  nicotine  (NICODERM CQ  - DOSED IN MG/24 HOURS) 21 mg/24hr patch [id number]  PLACE ONE PATCH (21 MG TOTAL) ONTO THE SKIN DAILY.  Patient not taking: Reported on 08/08/2024   [name], FNP  Active   OXYGEN  [id number]  Inhale 2 L/min into the lungs continuous. [provider]  Active    pramipexole  (MIRAPEX ) 0.125 MG tablet [id number]  Take 1 tablet (0.125 mg total) by mouth at bedtime. [name], FNP  Active   spironolactone  (ALDACTONE ) 25 MG tablet [id number]  Take 1 tablet (25 mg total) by mouth daily. [name], FNP  Active   tamsulosin  (FLOMAX ) 0.4 MG CAPS capsule [id number]  TAKE ONE CAPSULE (0.4 MG TOTAL) BY MOUTH DAILY. (NEEDS APPT FOR FUTURE FILL) Scoggins, Amber, NP  Active Pharmacy Records, Family Member            Recommendation:   Continue Current Plan of Care  Follow Up Plan:   Telephone follow-up in 1 week  Medford Balboa, BSN, RN [employer]  VBCI - Population Health RN Care Manager [phone]

## 2024-08-15 NOTE — Patient Instructions (Signed)
 Visit Information  Thank you for taking time to visit with me today. Please don't hesitate to contact me if I can be of assistance to you before our next scheduled telephone appointment.  Our next appointment is by telephone on Tuesday December 16th at 11:30am  Following is a copy of your care plan:   Goals Addressed             This Visit's Progress    VBCI Transitions of Care (TOC) Care Plan       Problems: (reviewed 08/15/24) Recent Hospitalization for treatment of Falls, COPD exacerbation Knowledge Deficit Related to Current Health Status Hospital or ED Adm Risk is 94%  Goal:  (reviewed 08/15/24) Over the next 30 days, the patient will not experience hospital readmission  Interventions:  (reviewed 08/15/24)  Falls Interventions: Reviewed medications and discussed potential side effects of medications such as dizziness and frequent urination Advised patient of importance of notifying provider of falls Assessed for falls since last encounter Assessed patients knowledge of fall risk prevention secondary to previously provided education Screening for signs and symptoms of depression related to chronic disease state  Assessed social determinant of health barriers Use assistive device as needed Clear pathways, remove obstacles, throw rugs HHPT and OT by [facility] Take medication for RLS - 10/27 - Medication has been changed to Mirapex  with better results 08/15/24 - Neurology appointment scheduled for 08/17/24   COPD Interventions: (reviewed 08/15/24) Advised patient to track and manage COPD triggers Assessed social determinant of health barriers Discussed the importance of adequate rest and management of fatigue with COPD Provided education about and advised patient to utilize infection prevention strategies to reduce risk of respiratory infection Provided instruction about proper use of medications used for management of COPD including inhalers Use of home oxygen  Do not smoke if  using oxygen . Do not expose concentrator or tanks to heat. Leave the tanks upright Use Nicotine  patch as needed but do not smoke while using the patch  Oxygen  continuous at 2l/min   Patient Self Care Activities:   (reviewed 08/15/24) Attend all scheduled provider appointments Call pharmacy for medication refills 3-7 days in advance of running out of medications Call provider office for new concerns or questions  Notify RN Care Manager of [facility] call rescheduling needs Participate in Transition of Care Program/Attend [facility] scheduled calls Perform all self care activities independently  Take medications as prescribed   Encouraged smoking cessation and/or reduction - The patient states he smokes between 2 and 2.5 packs a day  Plan:  Telephone follow up appointment with care management team member scheduled for:  Tuesday December 16th at 11:30am        The patient verbalized understanding of instructions, educational materials, and care plan provided today and agreed to receive a mailed copy of patient instructions, educational materials, and care plan.   The patient has been provided with contact information for the care management team and has been advised to call with any health related questions or concerns.   Please call the care guide team at [phone] if you need to cancel or reschedule your appointment.   Please call the Suicide and Crisis Lifeline: 988 call the [place]  National Suicide Prevention Lifeline: 216-660-6366 or TTY: 337 859 3045 TTY [phone]) to talk to a trained counselor if you are experiencing a Mental Health or Behavioral Health Crisis or need someone to talk to.  Medford Balboa, BSN, RN [place]  VBCI - [employer] Health RN Care Manager [phone]

## 2024-08-17 ENCOUNTER — Other Ambulatory Visit: Payer: Self-pay | Admitting: Cardiology

## 2024-08-17 ENCOUNTER — Other Ambulatory Visit: Payer: Self-pay | Admitting: Physician Assistant

## 2024-08-17 ENCOUNTER — Ambulatory Visit

## 2024-08-17 ENCOUNTER — Ambulatory Visit: Admitting: Physician Assistant

## 2024-08-17 VITALS — BP 112/66 | HR 103 | Resp 23

## 2024-08-17 DIAGNOSIS — N401 Enlarged prostate with lower urinary tract symptoms: Secondary | ICD-10-CM

## 2024-08-17 DIAGNOSIS — R413 Other amnesia: Secondary | ICD-10-CM

## 2024-08-17 MED ORDER — DONEPEZIL HCL 10 MG PO TABS: ORAL_TABLET | ORAL | 11 refills | Status: AC

## 2024-08-17 NOTE — Progress Notes (Signed)
 Memory impairment, likely of multiple etiologies concern for vascular  Colton Nguyen is a very pleasant 78 y.o. year old RH male with a history of hypertension, hyperlipidemia, COPD with a recent acute on chronic respiratory failure November 2025, tobacco abuse, remote history of squamous cell lung Ca, CHF, CAD, BPH, polyarthritis, microcytic anemia, cervical spine disease, seen today for evaluation of memory loss. MoCA today is 16/30.  Etiology is unclear, likely multifactorial including vascular disease, perhaps exacerbated by persistent hypercapnia.  The patient is accompanied by his sister  who supplements  the history. Discussed starting donepezil 10 mg in an effort to slow down cognitive decline, patient agrees.  He needs assistance with his ADLs, does not drive.  Start donepezil 10 mg as directed, side effects discussed Check B12, patient prefers PCP to have this drawn Tobacco cessation counseled Follow-up cervical spine disease with gait dysfunction with neurosurgery Recommend good control of cardiovascular risk factors Follow-up in 6 months   Discussed the use of AI scribe software for clinical note transcription with the patient, who gave verbal consent to proceed.  History of Present Illness Colton Nguyen is a 78 year old male with COPD who presents with memory changes. He is accompanied by his sister. He was referred by the hospital for evaluation of memory changes.  Memory changes began in June 2025, primarily affecting short-term memory, such as remembering new information and conversations, while long-term memory remains relatively intact. He repeats himself more frequently and initially experienced some depression, which has since improved.  Of note, Wellbutrin  was tried for depression but caused side effects like jitteriness and lack of energy so he is not on any antidepressants at this time.  He has a history of COPD and experiences visual hallucinations  during exacerbations due to CO2 buildup, describing seeing people, and a little boy during these episodes. No history of seizures, sleeps well, and does not experience frequent nightmares.  He has cervical spine disease, chronic pain, and gait dysfunction. He uses a walker and receives physical therapy once a week.  He experiences urge incontinence and uses diapers.  His sister manages his medications and finances.  He has a history of smoking, currently one and a half packs per day, and is trying to reduce his intake.   His appetite varies, and he does not drink much water .   There is a family history of Alzheimer's disease on his father's side, with three aunts affected.   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      Recent labs October 2025 TSH 0.351, ferritin 48, iron  30, with saturation 8%, TIBC 371  MRI of the brain personally reviewed 06/14/2024 without acute intracranial abnormalities normal volume for age, major vascular flow voids stable, dominant left vertebral artery.  Allergies[1]  Current Outpatient Medications  Medication Instructions   acetaminophen  (TYLENOL ) 1,000 mg, Every 6 hours PRN   albuterol  (VENTOLIN  HFA) 108 (90 Base) MCG/ACT inhaler 1-2 puffs, Inhalation, Every 4 hours PRN   aspirin  81 mg, Daily   atorvastatin  (LIPITOR) 40 mg, Oral, Daily   azelastine  (ASTELIN ) 0.1 % nasal spray 1 spray, Each Nare, 2 times daily   budesonide -glycopyrrolate -formoterol  (BREZTRI ) 160-9-4.8 MCG/ACT AERO inhaler 2 puffs, Inhalation, 2 times daily   buPROPion  (WELLBUTRIN  SR) 150 mg, Oral, 2 times daily   cyclobenzaprine  (FLEXERIL ) 5 MG tablet TAKE ONE TABLET (5 MG TOTAL) BY MOUTH THREE TIMES DAILY AS NEEDED FOR MUSCLE SPASMS.  dapagliflozin  propanediol (FARXIGA ) 10 mg, Oral, Daily before breakfast   ferrous sulfate  325 mg, Oral, Daily with breakfast   fluticasone  (FLONASE ) 50 MCG/ACT nasal spray 2 sprays, Daily PRN    furosemide  (LASIX ) 40 mg, Oral, Every M-W-F   ipratropium-albuterol  (DUONEB) 0.5-2.5 (3) MG/3ML SOLN 3 mLs, Nebulization, Every 6 hours PRN   losartan  (COZAAR ) 50 mg, Oral, Daily   nicotine  (NICODERM CQ  - DOSED IN MG/24 HOURS) 21 mg/24hr patch PLACE ONE PATCH (21 MG TOTAL) ONTO THE SKIN DAILY.   OXYGEN  2 L/min, Continuous   pramipexole  (MIRAPEX ) 0.125 mg, Oral, Daily at bedtime   spironolactone  (ALDACTONE ) 25 mg, Oral, Daily   tamsulosin  (FLOMAX ) 0.4 MG CAPS capsule TAKE ONE CAPSULE (0.4 MG TOTAL) BY MOUTH DAILY. (NEEDS APPT FOR FUTURE FILL)   Vitamin D3 5,000 Units, Daily     VITALS:   Vitals:   08/17/24 1021  BP: 112/66  Pulse: (!) 103  Resp: (!) 23  SpO2: 94%     Neurological Exam     08/17/2024   10:00 AM  Montreal Cognitive Assessment   Visuospatial/ Executive (0/5) 3  Naming (0/3) 3  Attention: Read list of digits (0/2) 2  Attention: Read list of letters (0/1) 1  Attention: Serial 7 subtraction starting at 100 (0/3) 1  Language: Repeat phrase (0/2) 1  Language : Fluency (0/1) 1  Abstraction (0/2) 0  Delayed Recall (0/5) 1  Orientation (0/6) 3  Total 16  Adjusted Score (based on education) 16        No data to display             Orientation:  Alert and oriented to person, place and not to time . No aphasia or dysarthria. Fund of knowledge is appropriate. Recent and remote memory impaired.  Attention and concentration are reduced .  Able to name objects and repeat phrases 1/2.   Delayed recall 1 /5 .  Cranial nerves: There is good facial symmetry. Extraocular muscles are intact and visual fields are full to confrontational testing. Speech is fluent and clear. No tongue deviation. Hearing is intact to conversational tone.  Tone: Tone is good throughout. Sensation: Sensation is intact to light touch.  Vibration is intact at the bilateral big toe.  Coordination: The patient has no difficulty with RAM's or FNF bilaterally. Normal finger to nose.  He does show some  mild asterixis on the left. Motor: Strength is 5/5 in the bilateral upper and lower extremities. There is no pronator drift. There are no fasciculations noted.   DTR's: Deep tendon reflexes are 2/4 bilaterally at the knee, at the ankle 1/4.  Gait and Station: Unable to test, the patient had significant difficulty getting out of the wheelchair     Thank you for allowing us  the opportunity to participate in the care of this nice patient. Please do not hesitate to contact us  for any questions or concerns.   Total time spent on today's visit was 45 minutes dedicated to this patient today, preparing to see patient, examining the patient, ordering tests and/or medications and counseling the patient, documenting clinical information in the EHR or other health record, independently interpreting results and communicating results to the patient/family, discussing treatment and goals, answering patient's questions and coordinating care.  Cc:  [name], FNP  Camie Sevin 08/17/2024 10:38 AM       [1] No Known Allergies

## 2024-08-22 ENCOUNTER — Other Ambulatory Visit: Payer: Self-pay

## 2024-08-22 ENCOUNTER — Other Ambulatory Visit: Payer: Self-pay | Admitting: Family

## 2024-08-22 NOTE — Patient Instructions (Addendum)
 Visit Information  Thank you for taking time to visit with me today. Please don't hesitate to contact me if I can be of assistance to you before our next scheduled telephone appointment.  Our next appointment is by telephone on Tuesday December 23rd at 12:00pm  Following is a copy of your care plan:   Goals Addressed             This Visit's Progress    VBCI Transitions of Care (TOC) Care Plan       Problems: (reviewed 08/22/24) Recent Hospitalization for treatment of Falls, COPD exacerbation Knowledge Deficit Related to Current Health Status Hospital or ED Adm Risk is 94%  Goal:  (reviewed 08/22/24) Over the next 30 days, the patient will not experience hospital readmission  Interventions:  (reviewed 08/22/24)  Falls Interventions: Reviewed medications and discussed potential side effects of medications such as dizziness and frequent urination Advised patient of importance of notifying provider of falls Assessed for falls since last encounter Assessed patients knowledge of fall risk prevention secondary to previously provided education Screening for signs and symptoms of depression related to chronic disease state  Assessed social determinant of health barriers Use assistive device as needed Clear pathways, remove obstacles, throw rugs HHPT and OT by [facility] Take medication for RLS - 10/27 - Medication has been changed to Mirapex  with better results 08/15/24 - Neurology appointment scheduled for 08/17/24 - Completed. Started on Donepezil  08/22/24 - Discontinued Wellbutrin  due to shakiness, Falls, disorientation  COPD Interventions: (reviewed 08/22/24) Advised patient to track and manage COPD triggers Assessed social determinant of health barriers Discussed the importance of adequate rest and management of fatigue with COPD Provided education about and advised patient to utilize infection prevention strategies to reduce risk of respiratory infection Provided instruction  about proper use of medications used for management of COPD including inhalers Use of home oxygen  Do not smoke if using oxygen . Do not expose concentrator or tanks to heat. Leave the tanks upright Use Nicotine  patch as needed but do not smoke while using the patch  Oxygen  continuous at 2l/min Wear CPAP even while napping   Patient Self Care Activities:   (reviewed 08/22/24) Attend all scheduled provider appointments Call pharmacy for medication refills 3-7 days in advance of running out of medications Call provider office for new concerns or questions  Notify RN Care Manager of [facility] call rescheduling needs Participate in Transition of Care Program/Attend TOC scheduled calls Perform all self care activities independently  Take medications as prescribed   Encouraged smoking cessation and/or reduction - The patient states he smokes between 2 and 2.5 packs a day  Plan:  Telephone follow up appointment with care management team member scheduled for:  Tuesday December 23rd at 12:00pm        Patient verbalizes understanding of instructions and care plan provided today and agrees to view in MyChart. Active MyChart status and patient understanding of how to access instructions and care plan via MyChart confirmed with patient.     The patient has been provided with contact information for the care management team and has been advised to call with any health related questions or concerns.   Please call the care guide team at [phone] if you need to cancel or reschedule your appointment.   Please call the Suicide and Crisis Lifeline: 988 call the [place]  National Suicide Prevention Lifeline: 205-061-3269 or TTY: 703-536-7216 TTY 907-492-6659) to talk to a trained counselor if you are experiencing a Mental Health or Behavioral Health Crisis  or need someone to talk to.  Medford Balboa, BSN, RN [place]  VBCI - [employer] [facility] RN Care Manager [phone]

## 2024-08-22 NOTE — Transitions of Care (Post Inpatient/ED Visit) (Signed)
 Transition of Care week 3  Visit Note  08/22/2024  Name: Colton Nguyen MRN: [id number]          DOB: 17-Jun-1946  Situation: Patient enrolled in [facility] 30-day program. Visit completed with [name] Arts, DPR by telephone.   Background:   Initial Transition Care Management Follow-up Telephone Call Discharge Date and Diagnosis: 08/02/24, Fall; COPD exacerbation   Past Medical History:  Diagnosis Date   Acute respiratory failure with hypoxia (HCC) 01/28/2023   Anxiety    situational   Arthritis    BPH (benign prostatic hyperplasia)    CAD (coronary artery disease)    CHF (congestive heart failure) (HCC)    pt denies 11/13/2019   Chronic cardiopulmonary disease (HCC)    COPD (chronic obstructive pulmonary disease) (HCC)    History of kidney stones    Hyperlipidemia    Hypertension    Hypertension 12/28/2016   lung ca dx'd 03/2015   Mass of lung    Nicotine  dependence    Personal history of kidney stones    Restless leg    Shortness of breath dyspnea    with exertion   Tuberculosis    Wears glasses     Assessment: Patient Reported Symptoms: Cognitive Cognitive Status: Able to follow simple commands, Requires Assistance Decision Making, Struggling with memory recall      Neurological Neurological Review of Symptoms: Other: [place] Neurological Symptoms/Conditions [RPT]: Restless Legs Neurological Management Strategies: Medication therapy Neurological Comment: Completed Neurology appointment  HEENT HEENT Symptoms Reported: No symptoms reported      Cardiovascular Cardiovascular Symptoms Reported: No symptoms reported Does patient have uncontrolled Hypertension?: No Cardiovascular Management Strategies: Medication therapy Weight: 151 lb (68.5 kg)  Respiratory Respiratory Symptoms Reported: Dry cough Additional Respiratory Details: The patient smokes. He wears oxygen  at 2l/min. He is asking to wear his CPAP during the day when he naps. Respiratory Management  Strategies: Oxygen  therapy, CPAP, Coping strategies, Routine screening, Medication therapy, Activity  Endocrine Endocrine Symptoms Reported: No symptoms reported Is patient diabetic?: No    Gastrointestinal Gastrointestinal Symptoms Reported: No symptoms reported      Genitourinary Genitourinary Symptoms Reported: Incontinence Additional Genitourinary Details: Urge Incontinence Genitourinary Management Strategies: Incontinence garment/pad  Integumentary Integumentary Symptoms Reported: No symptoms reported    Musculoskeletal Musculoskelatal Symptoms Reviewed: Weakness, Limited mobility, Difficulty walking Additional Musculoskeletal Details: Uses a walker some difficulty with transfers Musculoskeletal Management Strategies: Activity, Routine screening, Medical device Musculoskeletal Comment: HHPT continues with weekly therapy   Patient at Risk for Falls Due to: History of fall(s)  Psychosocial Psychosocial Symptoms Reported: No symptoms reported         Today's Vitals   08/22/24 1150  Weight: 151 lb (68.5 kg)      Medications Reviewed Today     Reviewed by Moises Reusing, RN (Case Manager) on 08/22/24 at 1136  Med List Status: <None>   Medication Order Taking? Sig Documenting Provider Last Dose Status Informant  acetaminophen  (TYLENOL ) 500 MG tablet [id number] Yes Take 1,000 mg by mouth every 6 (six) hours as needed for moderate pain or headache. [provider]  Active Pharmacy Records, Family Member  albuterol  (VENTOLIN  HFA) 108 [phone] Base) MCG/ACT inhaler [id number] Yes Inhale 1-2 puffs into the lungs every 4 (four) hours as needed for wheezing or shortness of breath. Lenon Marien CROME, MD  Active Pharmacy Records, Family Member  aspirin  81 MG tablet [id number] Yes Take 81 mg by mouth daily. [provider]  Active Pharmacy Records, Family Member  Med Note DAWN, AMANDA R   Sat Dec 28, 2015  4:48 AM) .  atorvastatin  (LIPITOR) 40 MG tablet [id number]  Yes Take 1 tablet (40 mg total) by mouth daily. [name], FNP  Active Pharmacy Records, Family Member  azelastine  (ASTELIN ) 0.1 % nasal spray [id number] Yes Place 1 spray into both nostrils 2 (two) times daily. [name], FNP  Active   budesonide -glycopyrrolate -formoterol  (BREZTRI ) 160-9-4.8 MCG/ACT AERO inhaler [id number] Yes Inhale 2 puffs into the lungs 2 (two) times daily. Jerelene Critchley, MD  Active   buPROPion  (WELLBUTRIN  SR) 150 MG 12 hr tablet [id number]  Take 1 tablet (150 mg total) by mouth 2 (two) times daily.  Patient not taking: Reported on 08/22/2024   Jerelene Critchley, MD  Active   Cholecalciferol  (VITAMIN D3) 5000 UNITS TABS [id number] Yes Take 5,000 Units by mouth daily. [provider]  Active Pharmacy Records, Family Member  cyclobenzaprine  (FLEXERIL ) 5 MG tablet [id number] Yes TAKE ONE TABLET (5 MG TOTAL) BY MOUTH THREE TIMES DAILY AS NEEDED FOR MUSCLE SPASMS. [name], FNP  Active Pharmacy Records, Family Member  dapagliflozin  propanediol (FARXIGA ) 10 MG TABS tablet [id number] Yes Take 1 tablet (10 mg total) by mouth daily before breakfast. Jerelene Critchley, MD  Active   donepezil  (ARICEPT ) 10 MG tablet [id number]  Take half tablet (5 mg) daily for 2 weeks, then increase to the full tablet at 10 mg daily Wertman, Sara E, PA-C  Active   ferrous sulfate  325 (65 FE) MG tablet [id number] Yes Take 1 tablet (325 mg total) by mouth daily with breakfast. [name], FNP  Active Pharmacy Records, Family Member  fluticasone  (FLONASE ) 50 MCG/ACT nasal spray [id number] Yes Place 2 sprays into both nostrils daily as needed for allergies.  [provider]  Active Pharmacy Records, Family Member           Med Note JERALYN, [place] A   Fri Nov 03, 2019  3:16 PM)    furosemide  (LASIX ) 40 MG tablet [id number] Yes Take 1 tablet (40 mg total) by mouth every Monday, Wednesday, and Friday. [name], FNP  Active   ipratropium-albuterol  (DUONEB) 0.5-2.5 (3)  MG/3ML SOLN [id number] Yes Take 3 mLs by nebulization every 6 (six) hours as needed. Jerelene Critchley, MD  Active   losartan  (COZAAR ) 50 MG tablet [id number] Yes Take 1 tablet (50 mg total) by mouth daily. [name], FNP  Active Pharmacy Records, Family Member  nicotine  (NICODERM CQ  - DOSED IN MG/24 HOURS) 21 mg/24hr patch [id number]  PLACE ONE PATCH (21 MG TOTAL) ONTO THE SKIN DAILY.  Patient not taking: Reported on 08/22/2024   [name], FNP  Active   OXYGEN  [id number] Yes Inhale 2 L/min into the lungs continuous. [provider]  Active   pramipexole  (MIRAPEX ) 0.125 MG tablet [id number] Yes Take 1 tablet (0.125 mg total) by mouth at bedtime. [name], FNP  Active   spironolactone  (ALDACTONE ) 25 MG tablet [id number] Yes Take 1 tablet (25 mg total) by mouth daily. [name], FNP  Active   tamsulosin  (FLOMAX ) 0.4 MG CAPS capsule [id number] Yes TAKE ONE CAPSULE (0.4 MG TOTAL) BY MOUTH DAILY. (NEEDS APPT FOR FUTURE FILL) [name], FNP  Active             Recommendation:   Continue Current Plan of Care  Follow Up Plan:   Telephone follow-up in 1 week  Medford Balboa, BSN, RN [place]  VBCI - Population Health RN Care  Manager [phone]

## 2024-08-29 ENCOUNTER — Other Ambulatory Visit: Payer: Self-pay

## 2024-08-29 NOTE — Transitions of Care (Post Inpatient/ED Visit) (Signed)
 " Transition of Care week 4  Visit Note  08/29/2024  Name: Colton Nguyen MRN: [id number]          DOB: 1945-11-18  Situation: Patient enrolled in [facility] 30-day program. Visit completed with [name] Arts, DPR by telephone.   Background:   Initial Transition Care Management Follow-up Telephone Call Discharge Date and Diagnosis: 08/02/24, Fall; COPD exacerbation   Past Medical History:  Diagnosis Date   Acute respiratory failure with hypoxia (HCC) 01/28/2023   Anxiety    situational   Arthritis    BPH (benign prostatic hyperplasia)    CAD (coronary artery disease)    CHF (congestive heart failure) (HCC)    pt denies 11/13/2019   Chronic cardiopulmonary disease (HCC)    COPD (chronic obstructive pulmonary disease) (HCC)    History of kidney stones    Hyperlipidemia    Hypertension    Hypertension 12/28/2016   lung ca dx'd 03/2015   Mass of lung    Nicotine  dependence    Personal history of kidney stones    Restless leg    Shortness of breath dyspnea    with exertion   Tuberculosis    Wears glasses     Assessment: Patient Reported Symptoms: Cognitive Cognitive Status: Normal speech and language skills, Struggling with memory recall, Requires Assistance Decision Making      Neurological Neurological Review of Symptoms: Other: [place] Neurological Symptoms/Conditions [RPT]: Restless Legs Neurological Management Strategies: Medication therapy  HEENT HEENT Symptoms Reported: No symptoms reported      Cardiovascular Cardiovascular Symptoms Reported: No symptoms reported Does patient have uncontrolled Hypertension?: No Cardiovascular Management Strategies: Medication therapy  Respiratory Respiratory Symptoms Reported: Dry cough Additional Respiratory Details: The patient continues to smoke. Oxygen  now at 3l/min. Still struggles with wearing the CPAP Respiratory Management Strategies: Oxygen  therapy, CPAP, Coping strategies, Routine screening, Medication therapy, Activity   Endocrine Endocrine Symptoms Reported: No symptoms reported Is patient diabetic?: No    Gastrointestinal Gastrointestinal Symptoms Reported: No symptoms reported      Genitourinary Genitourinary Symptoms Reported: Incontinence Genitourinary Management Strategies: Incontinence garment/pad  Integumentary Integumentary Symptoms Reported: No symptoms reported    Musculoskeletal Musculoskelatal Symptoms Reviewed: Weakness, Limited mobility, Difficulty walking Additional Musculoskeletal Details: SOB with exertion Musculoskeletal Comment: HHPT   Patient at Risk for Falls Due to: History of fall(s)  Psychosocial Psychosocial Symptoms Reported: No symptoms reported         There were no vitals filed for this visit.    Medications Reviewed Today     Reviewed by Moises Reusing, RN (Case Manager) on 08/29/24 at 1320  Med List Status: <None>   Medication Order Taking? Sig Documenting Provider Last Dose Status Informant  acetaminophen  (TYLENOL ) 500 MG tablet [id number]  Take 1,000 mg by mouth every 6 (six) hours as needed for moderate pain or headache. [provider]  Active Pharmacy Records, Family Member  albuterol  (VENTOLIN  HFA) 108 [phone] Base) MCG/ACT inhaler [id number]  Inhale 1-2 puffs into the lungs every 4 (four) hours as needed for wheezing or shortness of breath. Lenon Marien CROME, MD  Active Pharmacy Records, Family Member  aspirin  81 MG tablet [id number]  Take 81 mg by mouth daily. [provider]  Active Pharmacy Records, Family Member           Med Note DAWN, [facility] R   Sat Dec 28, 2015  4:48 AM) .  atorvastatin  (LIPITOR) 40 MG tablet [id number]  Take 1 tablet (40 mg total) by mouth daily. [name], FNP  Active Pharmacy Records, Family Member  azelastine  (ASTELIN ) 0.1 % nasal spray [id number]  Place 1 spray into both nostrils 2 (two) times daily. [name], FNP  Active   budesonide -glycopyrrolate -formoterol  (BREZTRI ) 160-9-4.8 MCG/ACT AERO  inhaler [id number]  Inhale 2 puffs into the lungs 2 (two) times daily. Jerelene Critchley, MD  Active   buPROPion  (WELLBUTRIN  SR) 150 MG 12 hr tablet [id number]  Take 1 tablet (150 mg total) by mouth 2 (two) times daily.  Patient not taking: Reported on 08/22/2024   Jerelene Critchley, MD  Active   Cholecalciferol  (VITAMIN D3) 5000 UNITS TABS [id number]  Take 5,000 Units by mouth daily. [provider]  Active Pharmacy Records, Family Member  cyclobenzaprine  (FLEXERIL ) 5 MG tablet [id number]  TAKE ONE TABLET (5 MG TOTAL) BY MOUTH THREE TIMES DAILY AS NEEDED FOR MUSCLE SPASMS. [name], FNP  Active Pharmacy Records, Family Member  dapagliflozin  propanediol (FARXIGA ) 10 MG TABS tablet [id number]  Take 1 tablet (10 mg total) by mouth daily before breakfast. Jerelene Critchley, MD  Active   donepezil  (ARICEPT ) 10 MG tablet [id number]  Take half tablet (5 mg) daily for 2 weeks, then increase to the full tablet at 10 mg daily Wertman, Sara E, PA-C  Active   ferrous sulfate  325 (65 FE) MG tablet [id number]  Take 1 tablet (325 mg total) by mouth daily with breakfast. [name], FNP  Active Pharmacy Records, Family Member  fluticasone  (FLONASE ) 50 MCG/ACT nasal spray [id number]  Place 2 sprays into both nostrils daily as needed for allergies.  [provider]  Active Pharmacy Records, Family Member           Med Note JERALYN, [place] A   Fri Nov 03, 2019  3:16 PM)    furosemide  (LASIX ) 40 MG tablet [id number]  Take 1 tablet (40 mg total) by mouth every Monday, Wednesday, and Friday. [name], FNP  Active   ipratropium-albuterol  (DUONEB) 0.5-2.5 (3) MG/3ML SOLN [id number]  Take 3 mLs by nebulization every 6 (six) hours as needed. Jerelene Critchley, MD  Active   losartan  (COZAAR ) 50 MG tablet [id number]  Take 1 tablet (50 mg total) by mouth daily. [name], FNP  Active Pharmacy Records, Family Member  nicotine  (NICODERM CQ  - DOSED IN MG/24 HOURS) 21 mg/24hr patch [id number]  PLACE  ONE PATCH (21 MG TOTAL) ONTO THE SKIN DAILY.  Patient not taking: Reported on 08/22/2024   [name], FNP  Active   OXYGEN  [id number]  Inhale 2 L/min into the lungs continuous. [provider]  Active   pramipexole  (MIRAPEX ) 0.125 MG tablet [id number]  TAKE 1 TABLET (0.125 MG) BY MOUTH AT BEDTIME. [name], FNP  Active   spironolactone  (ALDACTONE ) 25 MG tablet [id number]  Take 1 tablet (25 mg total) by mouth daily. [name], FNP  Active   tamsulosin  (FLOMAX ) 0.4 MG CAPS capsule [id number]  TAKE ONE CAPSULE (0.4 MG TOTAL) BY MOUTH DAILY. (NEEDS APPT FOR FUTURE FILL) [name], FNP  Active             Recommendation:   Continue Current Plan of Care  Follow Up Plan:   Telephone follow-up in 1 week  Medford Balboa, BSN, RN [place]  VBCI - [facility] RN Care Manager [phone]     "

## 2024-08-29 NOTE — Patient Instructions (Signed)
 Visit Information  Thank you for taking time to visit with me today. Please don't hesitate to contact me if I can be of assistance to you before our next scheduled telephone appointment.  Our next appointment is by telephone on Tuesday January 6th at 12:00pm  Following is a copy of your care plan:   Goals Addressed             This Visit's Progress    VBCI Transitions of Care (TOC) Care Plan       Problems: (reviewed 08/28/24) Recent Hospitalization for treatment of Falls, COPD exacerbation Knowledge Deficit Related to Current Health Status Hospital or ED Adm Risk is 94%  Goal:  (reviewed 08/28/24) Over the next 30 days, the patient will not experience hospital readmission  Interventions:  (reviewed 08/28/24)  Falls Interventions: Reviewed medications and discussed potential side effects of medications such as dizziness and frequent urination Advised patient of importance of notifying provider of falls Assessed for falls since last encounter Assessed patients knowledge of fall risk prevention secondary to previously provided education Screening for signs and symptoms of depression related to chronic disease state  Assessed social determinant of health barriers Use assistive device as needed Clear pathways, remove obstacles, throw rugs HHPT and OT by [facility] Take medication for RLS - 10/27 - Medication has been changed to Mirapex  with better results 08/15/24 - Neurology appointment scheduled for 08/17/24 - Completed. Started on Donepezil  08/22/24 - Discontinued Wellbutrin  due to shakiness, Falls, disorientation  COPD Interventions: (reviewed 08/28/24) Advised patient to track and manage COPD triggers Assessed social determinant of health barriers Discussed the importance of adequate rest and management of fatigue with COPD Provided education about and advised patient to utilize infection prevention strategies to reduce risk of respiratory infection Provided instruction  about proper use of medications used for management of COPD including inhalers Use of home oxygen  Do not smoke if using oxygen . Do not expose concentrator or tanks to heat. Leave the tanks upright Use Nicotine  patch as needed but do not smoke while using the patch  Oxygen  continuous at 2l/min Wear CPAP even while napping - 12/23 - the patient needs a lot of prompts but is better about wearing it   Patient Self Care Activities:   (reviewed 08/28/24) Attend all scheduled provider appointments Call pharmacy for medication refills 3-7 days in advance of running out of medications Call provider office for new concerns or questions  Notify RN Care Manager of [facility] call rescheduling needs Participate in Transition of Care Program/Attend [facility] scheduled calls Perform all self care activities independently  Take medications as prescribed   Encouraged smoking cessation and/or reduction - The patient states he smokes between 2 and 2.5 packs a day  Plan:  Telephone follow up appointment with care management team member scheduled for:  Tuesday January 6th at 12:00pm        Patient verbalizes understanding of instructions and care plan provided today and agrees to view in MyChart. Active MyChart status and patient understanding of how to access instructions and care plan via MyChart confirmed with patient.     The patient has been provided with contact information for the care management team and has been advised to call with any health related questions or concerns.   Please call the care guide team at [phone] if you need to cancel or reschedule your appointment.   Please call the Suicide and Crisis Lifeline: 988 call the [place]  National Suicide Prevention Lifeline: 912-435-8850 or TTY: (808) 449-1810 TTY [phone]) to  talk to a trained counselor if you are experiencing a Mental Health or Behavioral Health Crisis or need someone to talk to.  Medford Balboa, BSN, RN [place]  VBCI -  [facility] [facility] RN Care Manager [phone]

## 2024-08-30 ENCOUNTER — Telehealth: Payer: Self-pay

## 2024-08-30 NOTE — Telephone Encounter (Signed)
 [facility] called to inform that pt missed two OT appointments on 12/4 and 12/6. Pt is scheduled for 12/28

## 2024-09-02 ENCOUNTER — Emergency Department (HOSPITAL_COMMUNITY)

## 2024-09-02 ENCOUNTER — Emergency Department (HOSPITAL_COMMUNITY)
Admission: EM | Admit: 2024-09-02 | Discharge: 2024-09-02 | Disposition: A | Discharge status: Home / Self Care | Attending: Emergency Medicine | Admitting: Emergency Medicine

## 2024-09-02 ENCOUNTER — Encounter (HOSPITAL_COMMUNITY): Payer: Self-pay | Admitting: Emergency Medicine

## 2024-09-02 ENCOUNTER — Inpatient Hospital Stay
Admission: EM | Admit: 2024-09-02 | Discharge: 2024-09-06 | DRG: 189 | Disposition: A | Discharge status: Home Health | Attending: Hospitalist | Admitting: Hospitalist

## 2024-09-02 ENCOUNTER — Emergency Department

## 2024-09-02 DIAGNOSIS — J9622 Acute and chronic respiratory failure with hypercapnia: Principal | ICD-10-CM | POA: Diagnosis present

## 2024-09-02 DIAGNOSIS — F17210 Nicotine dependence, cigarettes, uncomplicated: Secondary | ICD-10-CM | POA: Diagnosis not present

## 2024-09-02 DIAGNOSIS — I5032 Chronic diastolic (congestive) heart failure: Secondary | ICD-10-CM | POA: Diagnosis present

## 2024-09-02 DIAGNOSIS — Z1152 Encounter for screening for COVID-19: Secondary | ICD-10-CM

## 2024-09-02 DIAGNOSIS — N401 Enlarged prostate with lower urinary tract symptoms: Secondary | ICD-10-CM

## 2024-09-02 DIAGNOSIS — J9601 Acute respiratory failure with hypoxia: Secondary | ICD-10-CM | POA: Diagnosis present

## 2024-09-02 DIAGNOSIS — E870 Hyperosmolality and hypernatremia: Secondary | ICD-10-CM | POA: Diagnosis present

## 2024-09-02 DIAGNOSIS — Z7982 Long term (current) use of aspirin: Secondary | ICD-10-CM

## 2024-09-02 DIAGNOSIS — I10 Essential (primary) hypertension: Secondary | ICD-10-CM | POA: Insufficient documentation

## 2024-09-02 DIAGNOSIS — E785 Hyperlipidemia, unspecified: Secondary | ICD-10-CM | POA: Diagnosis present

## 2024-09-02 DIAGNOSIS — R4182 Altered mental status, unspecified: Secondary | ICD-10-CM

## 2024-09-02 DIAGNOSIS — I279 Pulmonary heart disease, unspecified: Secondary | ICD-10-CM | POA: Diagnosis present

## 2024-09-02 DIAGNOSIS — Z833 Family history of diabetes mellitus: Secondary | ICD-10-CM

## 2024-09-02 DIAGNOSIS — R600 Localized edema: Secondary | ICD-10-CM | POA: Insufficient documentation

## 2024-09-02 DIAGNOSIS — J441 Chronic obstructive pulmonary disease with (acute) exacerbation: Secondary | ICD-10-CM | POA: Diagnosis present

## 2024-09-02 DIAGNOSIS — J449 Chronic obstructive pulmonary disease, unspecified: Secondary | ICD-10-CM | POA: Insufficient documentation

## 2024-09-02 DIAGNOSIS — G2581 Restless legs syndrome: Secondary | ICD-10-CM | POA: Diagnosis present

## 2024-09-02 DIAGNOSIS — Z7951 Long term (current) use of inhaled steroids: Secondary | ICD-10-CM

## 2024-09-02 DIAGNOSIS — T59811A Toxic effect of smoke, accidental (unintentional), initial encounter: Secondary | ICD-10-CM | POA: Diagnosis present

## 2024-09-02 DIAGNOSIS — R000 Tachycardia, unspecified: Secondary | ICD-10-CM | POA: Diagnosis not present

## 2024-09-02 DIAGNOSIS — Z9981 Dependence on supplemental oxygen: Secondary | ICD-10-CM

## 2024-09-02 DIAGNOSIS — I110 Hypertensive heart disease with heart failure: Secondary | ICD-10-CM | POA: Diagnosis present

## 2024-09-02 DIAGNOSIS — Z79899 Other long term (current) drug therapy: Secondary | ICD-10-CM | POA: Diagnosis not present

## 2024-09-02 DIAGNOSIS — I2510 Atherosclerotic heart disease of native coronary artery without angina pectoris: Secondary | ICD-10-CM | POA: Diagnosis present

## 2024-09-02 DIAGNOSIS — F32A Depression, unspecified: Secondary | ICD-10-CM | POA: Diagnosis present

## 2024-09-02 DIAGNOSIS — J9621 Acute and chronic respiratory failure with hypoxia: Secondary | ICD-10-CM | POA: Diagnosis present

## 2024-09-02 DIAGNOSIS — E871 Hypo-osmolality and hyponatremia: Secondary | ICD-10-CM | POA: Diagnosis present

## 2024-09-02 DIAGNOSIS — N400 Enlarged prostate without lower urinary tract symptoms: Secondary | ICD-10-CM | POA: Diagnosis present

## 2024-09-02 DIAGNOSIS — Z981 Arthrodesis status: Secondary | ICD-10-CM

## 2024-09-02 DIAGNOSIS — Z8249 Family history of ischemic heart disease and other diseases of the circulatory system: Secondary | ICD-10-CM

## 2024-09-02 LAB — COMPREHENSIVE METABOLIC PANEL WITH GFR
ALT: 12 U/L (ref 0–44)
AST: 21 U/L (ref 15–41)
Albumin: 4 g/dL (ref 3.5–5.0)
Alkaline Phosphatase: 101 U/L (ref 38–126)
Anion gap: 13 (ref 5–15)
BUN: 21 mg/dL (ref 8–23)
CO2: 36 mmol/L — ABNORMAL HIGH (ref 22–32)
Calcium: 9 mg/dL (ref 8.9–10.3)
Chloride: 99 mmol/L (ref 98–111)
Creatinine, Ser: 0.91 mg/dL (ref 0.61–1.24)
GFR, Estimated: >60 mL/min
Glucose, Bld: 113 mg/dL — ABNORMAL HIGH (ref 70–99)
Potassium: 4 mmol/L (ref 3.5–5.1)
Sodium: 148 mmol/L — ABNORMAL HIGH (ref 135–145)
Total Bilirubin: 0.2 mg/dL (ref 0.0–1.2)
Total Protein: 6.7 g/dL (ref 6.5–8.1)

## 2024-09-02 LAB — BASIC METABOLIC PANEL WITH GFR
Anion gap: 8 (ref 5–15)
BUN: 22 mg/dL (ref 8–23)
CO2: 39 mmol/L — ABNORMAL HIGH (ref 22–32)
Calcium: 9.2 mg/dL (ref 8.9–10.3)
Chloride: 97 mmol/L — ABNORMAL LOW (ref 98–111)
Creatinine, Ser: 0.82 mg/dL (ref 0.61–1.24)
GFR, Estimated: >60 mL/min
Glucose, Bld: 106 mg/dL — ABNORMAL HIGH (ref 70–99)
Potassium: 3.7 mmol/L (ref 3.5–5.1)
Sodium: 144 mmol/L (ref 135–145)

## 2024-09-02 LAB — BLOOD GAS, ARTERIAL
Acid-Base Excess: 15.9 mmol/L — ABNORMAL HIGH (ref 0.0–2.0)
Bicarbonate: 45 mmol/L — ABNORMAL HIGH (ref 20.0–28.0)
Expiratory PAP: 8 cmH2O
FIO2: 45 %
Inspiratory PAP: 15 cmH2O
O2 Saturation: 99.5 %
Patient temperature: 37
pCO2 arterial: 76 mmHg (ref 32–48)
pH, Arterial: 7.38 (ref 7.35–7.45)
pO2, Arterial: 123 mmHg — ABNORMAL HIGH (ref 83–108)

## 2024-09-02 LAB — CBC
HCT: 39.1 % (ref 39.0–52.0)
Hemoglobin: 10.4 g/dL — ABNORMAL LOW (ref 13.0–17.0)
MCH: 23.1 pg — ABNORMAL LOW (ref 26.0–34.0)
MCHC: 26.6 g/dL — ABNORMAL LOW (ref 30.0–36.0)
MCV: 86.7 fL (ref 80.0–100.0)
Platelets: 209 K/uL (ref 150–400)
RBC: 4.51 MIL/uL (ref 4.22–5.81)
RDW: 17.3 % — ABNORMAL HIGH (ref 11.5–15.5)
WBC: 7.5 K/uL (ref 4.0–10.5)
nRBC: 0 % (ref 0.0–0.2)

## 2024-09-02 LAB — I-STAT VENOUS BLOOD GAS, ED
Acid-Base Excess: 17 mmol/L — ABNORMAL HIGH (ref 0.0–2.0)
Bicarbonate: 42.4 mmol/L — ABNORMAL HIGH (ref 20.0–28.0)
Calcium, Ion: 1.02 mmol/L — ABNORMAL LOW (ref 1.15–1.40)
HCT: 38 % — ABNORMAL LOW (ref 39.0–52.0)
Hemoglobin: 12.9 g/dL — ABNORMAL LOW (ref 13.0–17.0)
O2 Saturation: 83 %
Potassium: 3.7 mmol/L (ref 3.5–5.1)
Sodium: 143 mmol/L (ref 135–145)
TCO2: 44 mmol/L — ABNORMAL HIGH (ref 22–32)
pCO2, Ven: 53 mmHg (ref 44–60)
pH, Ven: 7.512 — ABNORMAL HIGH (ref 7.25–7.43)
pO2, Ven: 44 mmHg (ref 32–45)

## 2024-09-02 LAB — CBC WITH DIFFERENTIAL/PLATELET
Abs Immature Granulocytes: 0.04 K/uL (ref 0.00–0.07)
Basophils Absolute: 0 K/uL (ref 0.0–0.1)
Basophils Relative: 1 %
Eosinophils Absolute: 0 K/uL (ref 0.0–0.5)
Eosinophils Relative: 0 %
HCT: 37.9 % — ABNORMAL LOW (ref 39.0–52.0)
Hemoglobin: 10.8 g/dL — ABNORMAL LOW (ref 13.0–17.0)
Immature Granulocytes: 1 %
Lymphocytes Relative: 7 %
Lymphs Abs: 0.6 K/uL — ABNORMAL LOW (ref 0.7–4.0)
MCH: 23.7 pg — ABNORMAL LOW (ref 26.0–34.0)
MCHC: 28.5 g/dL — ABNORMAL LOW (ref 30.0–36.0)
MCV: 83.1 fL (ref 80.0–100.0)
Monocytes Absolute: 0.4 K/uL (ref 0.1–1.0)
Monocytes Relative: 5 %
Neutro Abs: 6.9 K/uL (ref 1.7–7.7)
Neutrophils Relative %: 86 %
Platelets: 189 K/uL (ref 150–400)
RBC: 4.56 MIL/uL (ref 4.22–5.81)
RDW: 17.1 % — ABNORMAL HIGH (ref 11.5–15.5)
WBC: 7.9 K/uL (ref 4.0–10.5)
nRBC: 0 % (ref 0.0–0.2)

## 2024-09-02 LAB — I-STAT CG4 LACTIC ACID, ED: Lactic Acid, Venous: 1.4 mmol/L (ref 0.5–1.9)

## 2024-09-02 LAB — TROPONIN T, HIGH SENSITIVITY: Troponin T High Sensitivity: 47 ng/L — ABNORMAL HIGH (ref 0–19)

## 2024-09-02 MED ORDER — NALOXONE HCL 2 MG/2ML IJ SOSY
0.5000 mg | PREFILLED_SYRINGE | Freq: Once | INTRAMUSCULAR | Status: AC
  Administered 2024-09-02: 0.5 mg via INTRAVENOUS

## 2024-09-02 MED ORDER — IPRATROPIUM-ALBUTEROL 0.5-2.5 (3) MG/3ML IN SOLN
3.0000 mL | Freq: Once | RESPIRATORY_TRACT | Status: AC
  Administered 2024-09-02: 3 mL via RESPIRATORY_TRACT
  Filled 2024-09-02: qty 3

## 2024-09-02 MED ORDER — NALOXONE HCL 2 MG/2ML IJ SOSY
PREFILLED_SYRINGE | INTRAMUSCULAR | Status: AC
  Filled 2024-09-02: qty 2

## 2024-09-02 NOTE — Discharge Instructions (Signed)
 Continue your current home medications.  Return to the ER if you start having difficulty with your breathing
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
## 2024-09-02 NOTE — ED Notes (Signed)
 Pt family states she notices when pt starts acting different and believes he is retaining CO2. She states she tries to encourage pt to wear cpap, nicotine  patch, and breathing tx. She states sometimes pt doesn't listen and then these accidents happen.
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
## 2024-09-02 NOTE — ED Notes (Signed)
 RN educated pt the importance of stop smoking while on oxygen .

## 2024-09-03 ENCOUNTER — Other Ambulatory Visit: Payer: Self-pay

## 2024-09-03 DIAGNOSIS — Z7951 Long term (current) use of inhaled steroids: Secondary | ICD-10-CM | POA: Diagnosis not present

## 2024-09-03 DIAGNOSIS — J9621 Acute and chronic respiratory failure with hypoxia: Secondary | ICD-10-CM | POA: Diagnosis present

## 2024-09-03 DIAGNOSIS — Z9981 Dependence on supplemental oxygen: Secondary | ICD-10-CM | POA: Diagnosis not present

## 2024-09-03 DIAGNOSIS — I10 Essential (primary) hypertension: Secondary | ICD-10-CM | POA: Insufficient documentation

## 2024-09-03 DIAGNOSIS — I5032 Chronic diastolic (congestive) heart failure: Secondary | ICD-10-CM | POA: Insufficient documentation

## 2024-09-03 DIAGNOSIS — I279 Pulmonary heart disease, unspecified: Secondary | ICD-10-CM | POA: Diagnosis present

## 2024-09-03 DIAGNOSIS — J9622 Acute and chronic respiratory failure with hypercapnia: Secondary | ICD-10-CM | POA: Diagnosis present

## 2024-09-03 DIAGNOSIS — E785 Hyperlipidemia, unspecified: Secondary | ICD-10-CM | POA: Insufficient documentation

## 2024-09-03 DIAGNOSIS — E871 Hypo-osmolality and hyponatremia: Secondary | ICD-10-CM | POA: Diagnosis present

## 2024-09-03 DIAGNOSIS — J9601 Acute respiratory failure with hypoxia: Secondary | ICD-10-CM | POA: Diagnosis present

## 2024-09-03 DIAGNOSIS — J9602 Acute respiratory failure with hypercapnia: Secondary | ICD-10-CM | POA: Diagnosis not present

## 2024-09-03 DIAGNOSIS — G2581 Restless legs syndrome: Secondary | ICD-10-CM | POA: Diagnosis present

## 2024-09-03 DIAGNOSIS — J441 Chronic obstructive pulmonary disease with (acute) exacerbation: Secondary | ICD-10-CM | POA: Diagnosis present

## 2024-09-03 DIAGNOSIS — Z8249 Family history of ischemic heart disease and other diseases of the circulatory system: Secondary | ICD-10-CM | POA: Diagnosis not present

## 2024-09-03 DIAGNOSIS — F17210 Nicotine dependence, cigarettes, uncomplicated: Secondary | ICD-10-CM | POA: Diagnosis present

## 2024-09-03 DIAGNOSIS — F32A Depression, unspecified: Secondary | ICD-10-CM | POA: Diagnosis present

## 2024-09-03 DIAGNOSIS — Z7982 Long term (current) use of aspirin: Secondary | ICD-10-CM | POA: Diagnosis not present

## 2024-09-03 DIAGNOSIS — I2510 Atherosclerotic heart disease of native coronary artery without angina pectoris: Secondary | ICD-10-CM | POA: Diagnosis present

## 2024-09-03 DIAGNOSIS — Z981 Arthrodesis status: Secondary | ICD-10-CM | POA: Diagnosis not present

## 2024-09-03 DIAGNOSIS — Z79899 Other long term (current) drug therapy: Secondary | ICD-10-CM | POA: Diagnosis not present

## 2024-09-03 DIAGNOSIS — E870 Hyperosmolality and hypernatremia: Secondary | ICD-10-CM | POA: Diagnosis present

## 2024-09-03 DIAGNOSIS — Z833 Family history of diabetes mellitus: Secondary | ICD-10-CM | POA: Diagnosis not present

## 2024-09-03 DIAGNOSIS — I110 Hypertensive heart disease with heart failure: Secondary | ICD-10-CM | POA: Diagnosis present

## 2024-09-03 DIAGNOSIS — N400 Enlarged prostate without lower urinary tract symptoms: Secondary | ICD-10-CM | POA: Diagnosis present

## 2024-09-03 DIAGNOSIS — Z1152 Encounter for screening for COVID-19: Secondary | ICD-10-CM | POA: Diagnosis not present

## 2024-09-03 LAB — BASIC METABOLIC PANEL WITH GFR
Anion gap: 9 (ref 5–15)
BUN: 20 mg/dL (ref 8–23)
CO2: 38 mmol/L — ABNORMAL HIGH (ref 22–32)
Calcium: 8.9 mg/dL (ref 8.9–10.3)
Chloride: 100 mmol/L (ref 98–111)
Creatinine, Ser: 0.88 mg/dL (ref 0.61–1.24)
GFR, Estimated: >60 mL/min
Glucose, Bld: 125 mg/dL — ABNORMAL HIGH (ref 70–99)
Potassium: 4.2 mmol/L (ref 3.5–5.1)
Sodium: 147 mmol/L — ABNORMAL HIGH (ref 135–145)

## 2024-09-03 LAB — CBC
HCT: 36.6 % — ABNORMAL LOW (ref 39.0–52.0)
Hemoglobin: 10 g/dL — ABNORMAL LOW (ref 13.0–17.0)
MCH: 23.3 pg — ABNORMAL LOW (ref 26.0–34.0)
MCHC: 27.3 g/dL — ABNORMAL LOW (ref 30.0–36.0)
MCV: 85.1 fL (ref 80.0–100.0)
Platelets: 193 K/uL (ref 150–400)
RBC: 4.3 MIL/uL (ref 4.22–5.81)
RDW: 17.3 % — ABNORMAL HIGH (ref 11.5–15.5)
WBC: 6.9 K/uL (ref 4.0–10.5)
nRBC: 0 % (ref 0.0–0.2)

## 2024-09-03 LAB — RESP PANEL BY RT-PCR (RSV, FLU A&B, COVID)  RVPGX2
Influenza A by PCR: NEGATIVE
Influenza B by PCR: NEGATIVE
Resp Syncytial Virus by PCR: NEGATIVE
SARS Coronavirus 2 by RT PCR: NEGATIVE

## 2024-09-03 LAB — TROPONIN T, HIGH SENSITIVITY: Troponin T High Sensitivity: 34 ng/L — ABNORMAL HIGH (ref 0–19)

## 2024-09-03 MED ORDER — ENOXAPARIN SODIUM 40 MG/0.4ML IJ SOSY
40.0000 mg | PREFILLED_SYRINGE | INTRAMUSCULAR | Status: DC
  Administered 2024-09-03 – 2024-09-06 (×4): 40 mg via SUBCUTANEOUS
  Filled 2024-09-03 (×2): qty 0.4

## 2024-09-03 MED ORDER — VITAMIN D 25 MCG (1000 UNIT) PO TABS
5000.0000 [IU] | ORAL_TABLET | Freq: Every day | ORAL | Status: DC
  Administered 2024-09-03 – 2024-09-06 (×4): 5000 [IU] via ORAL
  Filled 2024-09-03 (×2): qty 5

## 2024-09-03 MED ORDER — ACETAMINOPHEN 325 MG PO TABS: 650.0000 mg | ORAL_TABLET | Freq: Four times a day (QID) | ORAL | Status: DC | PRN

## 2024-09-03 MED ORDER — SODIUM CHLORIDE 0.9 % IV SOLN: INTRAVENOUS | Status: DC

## 2024-09-03 MED ORDER — TRAZODONE HCL 50 MG PO TABS
25.0000 mg | ORAL_TABLET | Freq: Every evening | ORAL | Status: DC | PRN
  Administered 2024-09-04 – 2024-09-05 (×2): 25 mg via ORAL
  Filled 2024-09-03: qty 1

## 2024-09-03 MED ORDER — IPRATROPIUM-ALBUTEROL 0.5-2.5 (3) MG/3ML IN SOLN
3.0000 mL | Freq: Four times a day (QID) | RESPIRATORY_TRACT | Status: DC
  Administered 2024-09-03 – 2024-09-05 (×8): 3 mL via RESPIRATORY_TRACT
  Filled 2024-09-03 (×6): qty 3

## 2024-09-03 MED ORDER — IPRATROPIUM-ALBUTEROL 0.5-2.5 (3) MG/3ML IN SOLN
3.0000 mL | Freq: Once | RESPIRATORY_TRACT | Status: AC
  Administered 2024-09-03: 3 mL via RESPIRATORY_TRACT
  Filled 2024-09-03: qty 3

## 2024-09-03 MED ORDER — ONDANSETRON HCL 4 MG/2ML IJ SOLN: 4.0000 mg | Freq: Four times a day (QID) | INTRAMUSCULAR | Status: DC | PRN

## 2024-09-03 MED ORDER — FERROUS SULFATE 325 (65 FE) MG PO TABS
325.0000 mg | ORAL_TABLET | Freq: Every day | ORAL | Status: DC
  Administered 2024-09-03 – 2024-09-06 (×4): 325 mg via ORAL
  Filled 2024-09-03 (×2): qty 1

## 2024-09-03 MED ORDER — PRAMIPEXOLE DIHYDROCHLORIDE 0.25 MG PO TABS
0.1250 mg | ORAL_TABLET | Freq: Every day | ORAL | Status: DC
  Administered 2024-09-03 – 2024-09-05 (×3): 0.125 mg via ORAL
  Filled 2024-09-03 (×3): qty 0.5

## 2024-09-03 MED ORDER — ACETAMINOPHEN 650 MG RE SUPP: 650.0000 mg | Freq: Four times a day (QID) | RECTAL | Status: DC | PRN

## 2024-09-03 MED ORDER — TAMSULOSIN HCL 0.4 MG PO CAPS
0.4000 mg | ORAL_CAPSULE | Freq: Every day | ORAL | Status: DC
  Administered 2024-09-03 – 2024-09-06 (×4): 0.4 mg via ORAL
  Filled 2024-09-03 (×2): qty 1

## 2024-09-03 MED ORDER — LOSARTAN POTASSIUM 50 MG PO TABS
50.0000 mg | ORAL_TABLET | Freq: Every day | ORAL | Status: DC
  Administered 2024-09-03 – 2024-09-06 (×4): 50 mg via ORAL
  Filled 2024-09-03 (×2): qty 1

## 2024-09-03 MED ORDER — AZELASTINE HCL 0.1 % NA SOLN
1.0000 | Freq: Two times a day (BID) | NASAL | Status: DC
  Administered 2024-09-03 – 2024-09-06 (×7): 1 via NASAL
  Filled 2024-09-03 (×2): qty 30

## 2024-09-03 MED ORDER — ASPIRIN 81 MG PO TBEC
81.0000 mg | DELAYED_RELEASE_TABLET | Freq: Every day | ORAL | Status: DC
  Administered 2024-09-03 – 2024-09-06 (×4): 81 mg via ORAL
  Filled 2024-09-03 (×2): qty 1

## 2024-09-03 MED ORDER — DONEPEZIL HCL 5 MG PO TABS
10.0000 mg | ORAL_TABLET | Freq: Every day | ORAL | Status: DC
  Administered 2024-09-03 – 2024-09-05 (×3): 10 mg via ORAL
  Filled 2024-09-03 (×2): qty 2

## 2024-09-03 MED ORDER — MAGNESIUM HYDROXIDE 400 MG/5ML PO SUSP: 30.0000 mL | Freq: Every day | ORAL | Status: DC | PRN

## 2024-09-03 MED ORDER — ONDANSETRON HCL 4 MG PO TABS: 4.0000 mg | ORAL_TABLET | Freq: Four times a day (QID) | ORAL | Status: DC | PRN

## 2024-09-03 MED ORDER — CYCLOBENZAPRINE HCL 10 MG PO TABS: 5.0000 mg | ORAL_TABLET | Freq: Three times a day (TID) | ORAL | Status: DC | PRN

## 2024-09-03 MED ORDER — FUROSEMIDE 40 MG PO TABS
40.0000 mg | ORAL_TABLET | ORAL | Status: DC
  Administered 2024-09-04 – 2024-09-06 (×2): 40 mg via ORAL
  Filled 2024-09-03: qty 1

## 2024-09-03 MED ORDER — ATORVASTATIN CALCIUM 20 MG PO TABS
40.0000 mg | ORAL_TABLET | Freq: Every day | ORAL | Status: DC
  Administered 2024-09-03 – 2024-09-06 (×4): 40 mg via ORAL
  Filled 2024-09-03 (×2): qty 2

## 2024-09-03 MED ORDER — METHYLPREDNISOLONE SODIUM SUCC 40 MG IJ SOLR
40.0000 mg | Freq: Two times a day (BID) | INTRAMUSCULAR | Status: AC
  Administered 2024-09-03 (×2): 40 mg via INTRAVENOUS
  Filled 2024-09-03 (×2): qty 1

## 2024-09-03 MED ORDER — SPIRONOLACTONE 25 MG PO TABS
25.0000 mg | ORAL_TABLET | Freq: Every day | ORAL | Status: DC
  Administered 2024-09-03 – 2024-09-06 (×4): 25 mg via ORAL
  Filled 2024-09-03 (×2): qty 1

## 2024-09-03 MED ORDER — SODIUM CHLORIDE 0.9 % IV SOLN
1.0000 g | INTRAVENOUS | Status: DC
  Administered 2024-09-03: 1 g via INTRAVENOUS
  Filled 2024-09-03: qty 10

## 2024-09-03 MED ORDER — PREDNISONE 20 MG PO TABS
40.0000 mg | ORAL_TABLET | Freq: Every day | ORAL | Status: DC
  Administered 2024-09-04 – 2024-09-06 (×3): 40 mg via ORAL
  Filled 2024-09-03: qty 2

## 2024-09-03 MED ORDER — DAPAGLIFLOZIN PROPANEDIOL 10 MG PO TABS
10.0000 mg | ORAL_TABLET | Freq: Every day | ORAL | Status: DC
  Administered 2024-09-03 – 2024-09-06 (×4): 10 mg via ORAL
  Filled 2024-09-03 (×3): qty 1

## 2024-09-03 MED ORDER — FLUTICASONE PROPIONATE 50 MCG/ACT NA SUSP: 2.0000 | Freq: Every day | NASAL | Status: DC | PRN

## 2024-09-03 NOTE — H&P (Addendum)
 "     [place]   PATIENT NAME: Colton Nguyen    MR#:  [id number]  DATE OF BIRTH:  Sep 01, 1946  DATE OF ADMISSION:  09/02/2024  PRIMARY CARE PHYSICIAN: [name], FNP   Patient is coming from: Home  REQUESTING/REFERRING PHYSICIAN: Clarine Sharper, MD  CHIEF COMPLAINT:   Chief Complaint  Patient presents with   Shortness of Breath    HISTORY OF PRESENT ILLNESS:  Colton Nguyen is a 78 y.o. African-American male with medical history significant for anxiety, BPH, coronary artery disease, CHF, COPD on home O2 at 2 L/min, hypertension, dyslipidemia and lung cancer, who presented to the emergency room with a [place] of worsening dyspnea with associated dry cough and wheezing for the last 4 days.  He denied any fever or chills.  No chest pain or palpitations.  No nausea or vomiting or abdominal pain.  No dysuria, oliguria or hematuria or flank pain.  He was given 125 mg IV Solu-Medrol  and 2 g of IV magnesium  sulfate as well as DuoNeb by EMS.  ED Course: When he came to the ER, BP was 151/78 with respiratory rate of 23 with pulse oximetry of 100% on 100% nonrebreather and later on BiPAP.  Labs revealed ABG with pH 7.38 with pCO2 76 and pO2 123 with O2 sat of 99.5%.  BMP revealed a sodium 148 and CO2 of 36.  LFTs were within normal.  High sensitive troponin T was 47 and later 34.  CBC showed hemoglobin 10.4 hematocrit 39.1 with microcytosis.  Respiratory panel came back negative. EKG as reviewed by me : EKG showed sinus rhythm with a rate of 96 with Q waves anteroseptally. Imaging: Portable chest x-ray showed the following: 1.  Stable small right pleural effusion. 2.  Right apical pleural/pulmonary scarring with stable associated right sided volume loss.  The patient was given 0.5 mg of IV Narcan  and DuoNeb.  He will be admitted to a progressive unit bed for further evaluation and management. PAST MEDICAL HISTORY:   Past Medical History:  Diagnosis Date   Acute respiratory  failure with hypoxia (HCC) 01/28/2023   Anxiety    situational   Arthritis    BPH (benign prostatic hyperplasia)    CAD (coronary artery disease)    CHF (congestive heart failure) (HCC)    pt denies 11/13/2019   Chronic cardiopulmonary disease (HCC)    COPD (chronic obstructive pulmonary disease) (HCC)    History of kidney stones    Hyperlipidemia    Hypertension    Hypertension 12/28/2016   lung ca dx'd 03/2015   Mass of lung    Nicotine  dependence    Personal history of kidney stones    Restless leg    Shortness of breath dyspnea    with exertion   Tuberculosis    Wears glasses     PAST SURGICAL HISTORY:   Past Surgical History:  Procedure Laterality Date   BACK SURGERY     BIOPSY  01/25/2020   Procedure: BIOPSY;  Surgeon: Legrand Victory LITTIE DOUGLAS, MD;  Location: [facility];  Service: Gastroenterology;;   CAD CCTA 01/31/15     CERVICAL FUSION     COLONOSCOPY     COLONOSCOPY WITH PROPOFOL  N/A 01/25/2020   Procedure: COLONOSCOPY WITH PROPOFOL ;  Surgeon: Legrand Victory LITTIE DOUGLAS, MD;  Location: [facility];  Service: Gastroenterology;  Laterality: N/A;   ESOPHAGOGASTRODUODENOSCOPY (EGD) WITH PROPOFOL  N/A 01/25/2020   Procedure: ESOPHAGOGASTRODUODENOSCOPY (EGD) WITH PROPOFOL ;  Surgeon: Legrand Victory LITTIE DOUGLAS, MD;  Location: [facility];  Service: Gastroenterology;  Laterality: N/A;   FRACTURE SURGERY     left ankle   HEMOSTASIS CLIP PLACEMENT  01/25/2020   Procedure: HEMOSTASIS CLIP PLACEMENT;  Surgeon: Legrand Victory LITTIE DOUGLAS, MD;  Location: [facility];  Service: Gastroenterology;;   HOT HEMOSTASIS N/A 01/25/2020   Procedure: HOT HEMOSTASIS (ARGON PLASMA COAGULATION/BICAP);  Surgeon: Legrand Victory LITTIE DOUGLAS, MD;  Location: [facility];  Service: Gastroenterology;  Laterality: N/A;   LUNG BIOPSY N/A 02/27/2015   Procedure: LUNG BIOPSY;  Surgeon: Dallas KATHEE Jude, MD;  Location: [facility];  Service: Thoracic;  Laterality: N/A;   right inguinal hernia repair at age 59     VIDEO BRONCHOSCOPY WITH  ENDOBRONCHIAL ULTRASOUND N/A 02/27/2015   Procedure: VIDEO BRONCHOSCOPY WITH ENDOBRONCHIAL ULTRASOUND;  Surgeon: Dallas KATHEE Jude, MD;  Location: [facility];  Service: Thoracic;  Laterality: N/A;    SOCIAL HISTORY:   Social History   Tobacco Use   Smoking status: Every Day    Current packs/day: 2.50    Average packs/day: 2.5 packs/day for 56.6 years (141.4 ttl pk-yrs)    Types: Cigarettes    Start date: 02/14/1968    Passive exposure: Current   Smokeless tobacco: Never  Substance Use Topics   Alcohol  use: Yes    Alcohol /week: 0.0 standard drinks of alcohol     Comment: ocassional    FAMILY HISTORY:   Family History  Problem Relation Age of Onset   Cancer Other    Heart disease Father    Lung cancer Father    Hypertension Other    Diabetes Mother    Valvular heart disease Mother    Diabetes Sister    Colon polyps Maternal Uncle    Diabetes Sister    Diabetes Sister    Diabetes Niece    Colon cancer Cousin    Esophageal cancer Neg Hx    Stomach cancer Neg Hx    Pancreatic cancer Neg Hx     DRUG ALLERGIES:  Allergies[1]  REVIEW OF SYSTEMS:   ROS As per history of present illness. All pertinent systems were reviewed above. Constitutional, HEENT, cardiovascular, respiratory, GI, GU, musculoskeletal, neuro, psychiatric, endocrine, integumentary and hematologic systems were reviewed and are otherwise negative/unremarkable except for positive findings mentioned above in the HPI.   MEDICATIONS AT HOME:   Prior to Admission medications  Medication Sig Start Date End Date Taking? Authorizing Provider  acetaminophen  (TYLENOL ) 500 MG tablet Take 1,000 mg by mouth every 6 (six) hours as needed for moderate pain or headache.    [provider]  albuterol  (VENTOLIN  HFA) 108 (90 Base) MCG/ACT inhaler Inhale 1-2 puffs into the lungs every 4 (four) hours as needed for wheezing or shortness of breath. 04/21/24   Lenon Marien LITTIE, MD  aspirin  81 MG tablet Take 81 mg by mouth  daily.    [provider]  atorvastatin  (LIPITOR) 40 MG tablet Take 1 tablet (40 mg total) by mouth daily. 12/14/23   [name], FNP  azelastine  (ASTELIN ) 0.1 % nasal spray Place 1 spray into both nostrils 2 (two) times daily. 06/27/24   [name], FNP  budesonide -glycopyrrolate -formoterol  (BREZTRI ) 160-9-4.8 MCG/ACT AERO inhaler Inhale 2 puffs into the lungs 2 (two) times daily. 08/02/24   Jerelene Critchley, MD  buPROPion  (WELLBUTRIN  SR) 150 MG 12 hr tablet Take 1 tablet (150 mg total) by mouth 2 (two) times daily. Patient not taking: Reported on 08/22/2024 08/02/24   Jerelene Critchley, MD  Cholecalciferol  (VITAMIN D3) 5000 UNITS TABS  Take 5,000 Units by mouth daily.    [provider]  cyclobenzaprine  (FLEXERIL ) 5 MG tablet TAKE ONE TABLET (5 MG TOTAL) BY MOUTH THREE TIMES DAILY AS NEEDED FOR MUSCLE SPASMS. 11/30/23   [name], FNP  dapagliflozin  propanediol (FARXIGA ) 10 MG TABS tablet Take 1 tablet (10 mg total) by mouth daily before breakfast. 08/02/24   Jerelene Critchley, MD  donepezil  (ARICEPT ) 10 MG tablet Take half tablet (5 mg) daily for 2 weeks, then increase to the full tablet at 10 mg daily 08/17/24   Wertman, Sara E, PA-C  ferrous sulfate  325 (65 FE) MG tablet Take 1 tablet (325 mg total) by mouth daily with breakfast. 12/14/23   [name], FNP  fluticasone  (FLONASE ) 50 MCG/ACT nasal spray Place 2 sprays into both nostrils daily as needed for allergies.  04/02/15   [provider]  furosemide  (LASIX ) 40 MG tablet Take 1 tablet (40 mg total) by mouth every Monday, Wednesday, and Friday. 06/28/24   [name], FNP  ipratropium-albuterol  (DUONEB) 0.5-2.5 (3) MG/3ML SOLN Take 3 mLs by nebulization every 6 (six) hours as needed. 08/02/24   Jerelene Critchley, MD  losartan  (COZAAR ) 50 MG tablet Take 1 tablet (50 mg total) by mouth daily. 06/09/24   [name], FNP  nicotine  (NICODERM CQ  - DOSED IN MG/24 HOURS) 21 mg/24hr patch PLACE  ONE PATCH (21 MG TOTAL) ONTO THE SKIN DAILY. Patient not taking: Reported on 08/22/2024 06/15/24   [name], FNP  OXYGEN  Inhale 2 L/min into the lungs continuous.    [provider]  pramipexole  (MIRAPEX ) 0.125 MG tablet TAKE 1 TABLET (0.125 MG) BY MOUTH AT BEDTIME. 08/23/24   [name], FNP  spironolactone  (ALDACTONE ) 25 MG tablet Take 1 tablet (25 mg total) by mouth daily. 08/11/24   [name], FNP  tamsulosin  (FLOMAX ) 0.4 MG CAPS capsule TAKE ONE CAPSULE (0.4 MG TOTAL) BY MOUTH DAILY. (NEEDS APPT FOR FUTURE FILL) 08/18/24   [name], FNP      VITAL SIGNS:  Blood pressure 130/63, pulse 64, temperature 98.5 F (36.9 C), temperature source Axillary, resp. rate 18, weight 64.4 kg, SpO2 100%.  PHYSICAL EXAMINATION:  Physical Exam  GENERAL: Acutely ill 78 y.o.-year-old patient lying in the bed with mild respiratory distress on BiPAP.  He was somnolent but arousable. EYES: Pupils equal, round, reactive to light and accommodation. No scleral icterus. Extraocular muscles intact.  HEENT: Head atraumatic, normocephalic. Oropharynx and nasopharynx clear.  NECK:  Supple, no jugular venous distention. No thyroid enlargement, no tenderness.  LUNGS: Diffuse expiratory wheezes with tight expiratory airflow and the heart shows a clear breathing.. No use of accessory muscles of respiration.  CARDIOVASCULAR: Regular rate and rhythm, S1, S2 normal. No murmurs, rubs, or gallops.  ABDOMEN: Soft, nondistended, nontender. Bowel sounds present. No organomegaly or mass.  EXTREMITIES: 1+ bilateral lower extremity pitting edema with no cyanosis, or clubbing.  NEUROLOGIC: Cranial nerves II through XII are intact. Muscle strength 5/5 in all extremities. Sensation intact. Gait not checked.  PSYCHIATRIC: The patient is somnolent but arousable with normal affect and good eye contact. SKIN: No obvious rash, lesion, or ulcer.   LABORATORY PANEL:   CBC Recent Labs  Lab  09/03/24 0322  WBC 6.9  HGB 10.0*  HCT 36.6*  PLT 193   ------------------------------------------------------------------------------------------------------------------  Chemistries  Recent Labs  Lab 09/02/24 2203 09/03/24 0322  NA 148* 147*  K 4.0 4.2  CL 99 100  CO2 36* 38*  GLUCOSE 113* 125*  BUN 21 20  CREATININE 0.91 0.88  CALCIUM  9.0 8.9  AST 21  --   ALT 12  --   ALKPHOS 101  --   BILITOT 0.2  --    ------------------------------------------------------------------------------------------------------------------  Cardiac Enzymes No results for input(s): TROPONINI in the last 168 hours. ------------------------------------------------------------------------------------------------------------------  RADIOLOGY:  DG Chest Portable 1 View Result Date: 09/02/2024 EXAM: 1 VIEW(S) XRAY OF THE CHEST 09/02/2024 10:28:48 PM COMPARISON: 09/02/2024 CLINICAL HISTORY: sob FINDINGS: LUNGS AND PLEURA: Right apical pleural/pulmonary scarring with stable associated right-sided volume loss. Small right pleural effusion. No pneumothorax. HEART AND MEDIASTINUM: Aortic calcification. The cardiac silhouette shows no acute abnormality. BONES AND SOFT TISSUES: Cervical spine surgical hardware noted. No acute osseous abnormality. IMPRESSION: 1. Stable small right pleural effusion. 2. Right apical pleural/pulmonary scarring with stable associated right-sided volume loss. Electronically signed by: Dorethia Molt MD 09/02/2024 11:34 PM EST RP Workstation: [id number]   DG Chest Port 1 View Result Date: 09/02/2024 EXAM: 1 VIEW(S) XRAY OF THE CHEST 09/02/2024 06:24:00 AM COMPARISON: Portable chest x-ray 07/30/2024, chest CT 04/16/2024. CLINICAL HISTORY: 78 year old male with shortness of breath. 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 Lung volumes and ventilation  appear stable since that time. Evidence of stable small chronic right pleural effusion or pleural thickening. No acute lung opacity. No pneumothorax. HEART AND MEDIASTINUM: No acute abnormality of the cardiac and mediastinal silhouettes. BONES AND SOFT TISSUES: Partially visible cervical ACDF hardware. No acute osseous abnormality. Negative visible bowel gas. IMPRESSION: 1. 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 No acute cardiopulmonary abnormality. Electronically signed by: Helayne Hurst MD 09/02/2024 06:30 AM EST RP Workstation: [id number]      IMPRESSION AND PLAN:  Assessment and Plan: * Acute respiratory failure with hypoxia and hypercarbia (HCC) - This is clearly secondary to COPD exacerbation. - The patient will be admitted to progressive unit bed. - Will continue BiPAP. - Management otherwise as below.  COPD exacerbation (HCC) - We will place the patient IV steroid therapy with IV Solu-Medrol  as well as nebulized bronchodilator therapy with duonebs q.i.d. and q.4 hours p.r.n.SABRA - Mucolytic therapy will be provided with Mucinex  and antibiotic therapy with IV Rocephin . - O2 protocol will be followed.   Chronic diastolic CHF (congestive heart failure) (HCC) - We will continue Lasix , Farxiga  and Aldactone .  Essential hypertension - Will continue antihypertensive therapy.  Dyslipidemia - Continue statin therapy.  Depression - Will continue Wellbutrin  as ordered.  BPH (benign prostatic hyperplasia) - Will continue Flomax .   DVT prophylaxis: Lovenox . Advanced Care Planning:  Code Status: He was fairly confused but has requested full code in previous admissions.  This can be revisited with improvement of his mental status.  Per his sister he wanted to be full code in his last admissions. Family Communication:  The plan of care was discussed in details with the patient (and family). I answered all questions. The patient agreed to proceed with  the above mentioned plan. Further management will depend upon hospital course. Disposition Plan: Back to previous home environment Consults called: none.  All the records are reviewed and case discussed with ED provider.  Status is: Inpatient  At the time of the admission, it appears that the appropriate admission status for this patient is inpatient.  This is judged to be reasonable and necessary in order to provide the required intensity of service  to ensure the patient's safety given the presenting symptoms, physical exam findings and initial radiographic and laboratory data in the context of comorbid conditions.  The patient requires inpatient status due to high intensity of service, high risk of further deterioration and high frequency of surveillance required.  I certify that at the time of admission, it is my clinical judgment that the patient will require inpatient hospital care extending more than 2 midnights.                            Dispo: The patient is from: Home              Anticipated d/c is to: Home              Patient currently is not medically stable to d/c.              Difficult to place patient: No  Authorized and performed by: Madison Peaches, MD Total critical care time:    55    minutes. Due to a high probability of clinically significant, life-threatening deterioration, the patient required my highest level of preparedness to intervene emergently and I personally spent this critical care time directly and personally managing the patient.  This critical care time included obtaining a history, examining the patient, pulse oximetry, ordering and review of studies, arranging urgent treatment with development of management plan, evaluation of patient's response to treatment, frequent reassessment, and discussions with other providers. This critical care time was performed to assess and manage the high probability of imminent, life-threatening deterioration that could result in  multiorgan failure.  It was exclusive of separately billable procedures and treating other patients and teaching time.   Madison DELENA Peaches M.D on 09/03/2024 at 4:40 AM  [facility]   From 7 PM-7 AM, contact night-coverage [email or web address]  CC: Primary care physician; [name], FNP     [1] No Known Allergies  "
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
## 2024-09-03 NOTE — ED Notes (Signed)
 Pt repositioned in bed and tray positioned for pt to eat dinner.

## 2024-09-03 NOTE — Assessment & Plan Note (Addendum)
-   This is clearly secondary to COPD exacerbation. - The patient will be admitted to progressive unit bed. - Will continue BiPAP. - Management otherwise as below.

## 2024-09-03 NOTE — Assessment & Plan Note (Signed)
 Patient is seen by pulmonary, who manage this condition.  He is well controlled with current therapy.   Will defer to them for further changes to plan of care.

## 2024-09-03 NOTE — Progress Notes (Signed)
" ° °  Brief Progress Note   _____________________________________________________________________________________________________________  Patient Name: Colton Nguyen Patient DOB: 08-28-1946 Date: @TODAY @      Data: 78 yo male currently awaiting admission to a Progressive bed at [facility].     Action: I reached out to the provider to inquire about the possibility of downgrading the patient to a tele or med-surg level of care.    Response:  Per provider,  will update the patient's level of care to reflect a med-surg status.  _____________________________________________________________________________________________________________  [facility] RN [employer] Kayal [address] [place] Mckimmy Please contact us  directly via secure chat (search for [facility]) or by calling us  at [phone] [facility]).  "

## 2024-09-03 NOTE — ED Notes (Signed)
 Patient broke BiPAP mask while trying to remove. RT notified and presented to bedside. Pt confused, but talkative with staff. Pt placed on 2L nasal cannula. BP cuff and SpO2 reapplied.

## 2024-09-03 NOTE — Assessment & Plan Note (Signed)
 Patient stable.  Well controlled with current therapy.   Continue current meds.

## 2024-09-03 NOTE — Assessment & Plan Note (Addendum)
-   We will place the patient IV steroid therapy with IV Solu-Medrol as well as nebulized bronchodilator therapy with duonebs q.i.d. and q.4 hours p.r.n.Marland Kitchen - Mucolytic therapy will be provided with Mucinex and antibiotic therapy with IV Rocephin. - O2 protocol will be followed.

## 2024-09-03 NOTE — ED Notes (Signed)
Pt given breakfast tray. Pt still sleeping

## 2024-09-03 NOTE — Assessment & Plan Note (Signed)
-   Will continue Wellbutrin  as ordered.

## 2024-09-03 NOTE — Assessment & Plan Note (Signed)
 Patient is seen by cardiology, who manage this condition.  He is well controlled with current therapy.   Will defer to them for further changes to plan of care.

## 2024-09-03 NOTE — Assessment & Plan Note (Signed)
"  Continue statin therapy.         "

## 2024-09-03 NOTE — Assessment & Plan Note (Signed)
-   We will continue Lasix , Farxiga  and Aldactone .

## 2024-09-03 NOTE — ED Notes (Signed)
 Pt breakfast tray set up and pt sitting up in bed. Pt denies any current needs. Pt able to take both meds. Pt alert and oriented to place and self.
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
## 2024-09-03 NOTE — Assessment & Plan Note (Signed)
 The medication we switched him to at his last appointment does seem to be managing his symptoms more effectively.  Will send a refill for this.

## 2024-09-03 NOTE — Assessment & Plan Note (Signed)
-   Will continue antihypertensive therapy.

## 2024-09-03 NOTE — Assessment & Plan Note (Signed)
Will continue Flomax.

## 2024-09-03 NOTE — ED Notes (Signed)
 Advised nurse that patient has ready bed

## 2024-09-04 ENCOUNTER — Other Ambulatory Visit: Payer: Self-pay

## 2024-09-04 DIAGNOSIS — J9602 Acute respiratory failure with hypercapnia: Secondary | ICD-10-CM | POA: Diagnosis not present

## 2024-09-04 DIAGNOSIS — J9601 Acute respiratory failure with hypoxia: Secondary | ICD-10-CM | POA: Diagnosis not present

## 2024-09-04 LAB — BASIC METABOLIC PANEL WITH GFR
Anion gap: 5 (ref 5–15)
BUN: 21 mg/dL (ref 8–23)
CO2: 40 mmol/L — ABNORMAL HIGH (ref 22–32)
Calcium: 8.8 mg/dL — ABNORMAL LOW (ref 8.9–10.3)
Chloride: 100 mmol/L (ref 98–111)
Creatinine, Ser: 0.77 mg/dL (ref 0.61–1.24)
GFR, Estimated: >60 mL/min
Glucose, Bld: 81 mg/dL (ref 70–99)
Potassium: 4.2 mmol/L (ref 3.5–5.1)
Sodium: 145 mmol/L (ref 135–145)

## 2024-09-04 MED ORDER — ENSURE PLUS HIGH PROTEIN PO LIQD
237.0000 mL | Freq: Two times a day (BID) | ORAL | Status: DC
  Administered 2024-09-04 – 2024-09-06 (×6): 237 mL via ORAL

## 2024-09-04 MED ORDER — PREDNISONE 20 MG PO TABS
40.0000 mg | ORAL_TABLET | Freq: Every day | ORAL | 0 refills | Status: DC
  Filled 2024-09-04: qty 6, 3d supply, fill #0

## 2024-09-04 NOTE — Telephone Encounter (Signed)
 Left message to return call

## 2024-09-04 NOTE — Plan of Care (Signed)

## 2024-09-04 NOTE — Plan of Care (Signed)
  Problem: Education: Goal: Knowledge of General Education information will improve Description: Including pain rating scale, medication(s)/side effects and non-pharmacologic comfort measures Outcome: Not Progressing   Problem: Activity: Goal: Risk for activity intolerance will decrease Outcome: Not Progressing   

## 2024-09-04 NOTE — Progress Notes (Signed)
" °  PROGRESS NOTE    Colton Nguyen  FMW:[id number] DOB: 05-24-1946 DOA: 09/02/2024 PCP: [name], FNP  152A/152A-AA  LOS: 1 day   Brief hospital course:   Assessment & Plan: Colton Nguyen is a 78 y.o. African-American male with medical history significant for anxiety, BPH, coronary artery disease, CHF, COPD on home O2 at 2 L/min, hypertension, dyslipidemia and lung cancer, who presented to the emergency room with a Kalisetti of worsening dyspnea with associated dry cough and wheezing for the last 4 days.    * Acute respiratory failure with hypoxia and hypercarbia (HCC) --pt refused BiPAP --Continue supplemental O2 to keep sats >=90%, wean as tolerated  COPD exacerbation (HCC) --started on IV solumedrol  --transition to prednisone  40 mg daily today --DuoNeb  Chronic diastolic CHF (congestive heart failure) (HCC) --cont lasix , losartan  and aldactone   Essential hypertension --cont lasix , losartan  and aldactone   Dyslipidemia - Continue statin therapy.  BPH (benign prostatic hyperplasia) --cont Flomax   Hypernatremia, resolved   DVT prophylaxis: Lovenox  SQ Code Status: Full code  Family Communication:  Level of care: Med-Surg Dispo:   The patient is from: home Anticipated d/c is to: home Anticipated d/c date is: 1-2 days   Subjective and Interval History:  Pt asked to go home, but continued to have dyspnea that required PRN neb.   Objective: Vitals:   09/04/24 0721 09/04/24 1100 09/04/24 1502 09/04/24 1927  BP: (!) 160/77  135/74 (!) 148/99  Pulse: 75  (!) 103 100  Resp: (!) 21  15 18   Temp: 98.6 F (37 C)  98.6 F (37 C) 98.2 F (36.8 C)  TempSrc: Oral  Oral   SpO2: 98% 97% 95% 99%  Weight:        Intake/Output Summary (Last 24 hours) at 09/04/2024 2012 Last data filed at 09/04/2024 1402 Gross per 24 hour  Intake 110 ml  Output 1550 ml  Net -1440 ml   Filed Weights   09/02/24 2127  Weight: 64.4 kg    Examination:    Constitutional: NAD, AAOx3 HEENT: conjunctivae and lids normal, EOMI CV: No cyanosis.   RESP: normal respiratory effort Neuro: II - XII grossly intact.   Psych: Normal mood and affect.     Data Reviewed: I have personally reviewed labs and imaging studies  Time spent: 50 minutes  Ellouise Haber, MD [facility] If 7PM-7AM, please contact night-coverage 09/04/2024, 8:12 PM   "

## 2024-09-05 DIAGNOSIS — J9602 Acute respiratory failure with hypercapnia: Secondary | ICD-10-CM | POA: Diagnosis not present

## 2024-09-05 DIAGNOSIS — J9601 Acute respiratory failure with hypoxia: Secondary | ICD-10-CM | POA: Diagnosis not present

## 2024-09-05 MED ORDER — ARFORMOTEROL TARTRATE 15 MCG/2ML IN NEBU
15.0000 ug | INHALATION_SOLUTION | Freq: Two times a day (BID) | RESPIRATORY_TRACT | Status: DC
  Administered 2024-09-05 (×2): 15 ug via RESPIRATORY_TRACT
  Filled 2024-09-05 (×4): qty 2

## 2024-09-05 MED ORDER — IPRATROPIUM-ALBUTEROL 0.5-2.5 (3) MG/3ML IN SOLN
3.0000 mL | RESPIRATORY_TRACT | Status: DC | PRN
  Administered 2024-09-05: 3 mL via RESPIRATORY_TRACT
  Filled 2024-09-05: qty 3

## 2024-09-05 NOTE — Care Management Important Message (Signed)
 Important Message  Patient Details  Name: Colton Nguyen MRN: [id number] Date of Birth: 01/02/1946   Important Message Given:  Yes - Medicare IM     Ryun Velez W, CMA 09/05/2024, 1:54 PM

## 2024-09-05 NOTE — Plan of Care (Signed)
" °  Problem: Respiratory: Goal: Ability to maintain a clear airway will improve Outcome: Progressing   Problem: Activity: Goal: Ability to tolerate increased activity will improve Outcome: Progressing   Problem: Safety: Goal: Ability to remain free from injury will improve Outcome: Progressing   Problem: Elimination: Goal: Will not experience complications related to bowel motility Outcome: Progressing   Problem: Activity: Goal: Risk for activity intolerance will decrease Outcome: Progressing   "

## 2024-09-05 NOTE — Progress Notes (Signed)
" °  PROGRESS NOTE    Colton Nguyen  FMW:[id number] DOB: December 28, 1945 DOA: 09/02/2024 PCP: [name], FNP  152A/152A-AA  LOS: 2 days   Brief hospital course:   Assessment & Plan: Colton Nguyen is a 78 y.o. African-American male with medical history significant for anxiety, BPH, coronary artery disease, CHF, COPD on home O2 at 2 L/min, hypertension, dyslipidemia and lung cancer, who presented to the emergency room with a Kalisetti of worsening dyspnea with associated dry cough and wheezing for the last 4 days.    * Acute respiratory failure with hypoxia and hypercarbia (HCC) --pt refused BiPAP --Continue supplemental O2 to keep sats >=90%, wean as tolerated  COPD exacerbation (HCC) --started on IV solumedrol  --cont prednisone  40 mg daily --DuoNeb  Chronic diastolic CHF (congestive heart failure) (HCC) --cont lasix , losartan  and aldactone   Essential hypertension --cont lasix , losartan  and aldactone   Dyslipidemia --cont lipitor  BPH (benign prostatic hyperplasia) --cont Flomax   Hypernatremia, resolved   DVT prophylaxis: Lovenox  SQ Code Status: Full code  Family Communication:  Level of care: Med-Surg Dispo:   The patient is from: home Anticipated d/c is to: home Anticipated d/c date is: tomorrow   Subjective and Interval History:  Pt reported no dyspnea at rest today.  RN reported increased work of breathing with walking.   Objective: Vitals:   09/05/24 0910 09/05/24 1541 09/05/24 2021 09/05/24 2106  BP:  123/76  (!) 154/84  Pulse:  100  75  Resp:  16  16  Temp:  97.9 F (36.6 C)  99.2 F (37.3 C)  TempSrc:      SpO2: 97% 97% 97% 99%  Weight:        Intake/Output Summary (Last 24 hours) at 09/05/2024 2115 Last data filed at 09/05/2024 0500 Gross per 24 hour  Intake --  Output 700 ml  Net -700 ml   Filed Weights   09/02/24 2127  Weight: 64.4 kg    Examination:   Constitutional: NAD, alert, oriented HEENT: conjunctivae and  lids normal, EOMI CV: No cyanosis.   RESP: normal respiratory effort, on 2L Neuro: II - XII grossly intact.   Psych: Normal mood and affect.     Data Reviewed: I have personally reviewed labs and imaging studies  Time spent: 35 minutes  Ellouise Haber, MD [facility] If 7PM-7AM, please contact night-coverage 09/05/2024, 9:15 PM   "

## 2024-09-06 NOTE — TOC CM/SW Note (Signed)
 Per RN, sister requesting a new RW because the one patient has is not safe to use. CSW sent secure chat requesting DME order. Gave heads up referral to [employer] and asked them to expedite once the order is in.  Lauraine Carpen, CSW [phone]

## 2024-09-06 NOTE — Discharge Summary (Signed)
 "  Physician Discharge Summary   Colton Nguyen  male DOB: 16-Oct-1945  FMW:[id number]  PCP: [name], FNP  Admit date: 09/02/2024 Discharge date: 09/06/2024  Admitted From: home Disposition:  home Home Health: Yes CODE STATUS: Full code   Hospital Course:  For full details, please see H&P, progress notes, consult notes and ancillary notes.  Briefly,  Colton Nguyen is a 78 y.o. African-American male with medical history significant for anxiety, coronary artery disease, CHF, COPD on home O2 at 2 L/min, hypertension, and lung cancer, who presented to the emergency room with worsening dyspnea with associated dry cough and wheezing for the last 4 days.    * Acute on chronic hypoxic and hypercarbic respiratory failure  --pt refused BiPAP --Back on 2L O2 prior to discharge, RN walked with pt, no issue.    COPD exacerbation (HCC) --received 4 days of steroid burst with solumedrol and prednisone .  Received DuoNeb.  Respiratory status improved prior to discharge.   Chronic diastolic CHF (congestive heart failure) (HCC) --cont lasix , losartan  and aldactone    Essential hypertension --cont lasix , losartan  and aldactone    Dyslipidemia --cont lipitor   BPH (benign prostatic hyperplasia) --cont Flomax    Hypernatremia, resolved   Unless noted above, medications under STOP list are ones pt was not taking PTA.  Discharge Diagnoses:  Principal Problem:   Acute respiratory failure with hypoxia and hypercarbia (HCC) Active Problems:   COPD exacerbation (HCC)   BPH (benign prostatic hyperplasia)   Depression   Dyslipidemia   Essential hypertension   Chronic diastolic CHF (congestive heart failure) (HCC)   30 Day Unplanned Readmission Risk Score    Flowsheet Row ED to Hosp-Admission (Current) from 09/02/2024 in [facility] (1A)  30 Day Unplanned Readmission Risk Score (%) 38.49 Filed at 09/06/2024 0801    This score is the  patient's risk of an unplanned readmission within 30 days of being discharged (0 -100%). The score is based on dignosis, age, lab data, medications, orders, and past utilization.   Low:  0-14.9   Medium: 15-21.9   High: 22-29.9   Extreme: 30 and above         Discharge Instructions:  Allergies as of 09/06/2024   No Known Allergies      Medication List     STOP taking these medications    buPROPion  150 MG 12 hr tablet Commonly known as: WELLBUTRIN  SR       TAKE these medications    pramipexole  0.125 MG tablet Commonly known as: MIRAPEX  TAKE 1 TABLET (0.125 MG) BY MOUTH AT BEDTIME. The timing of this medication is very important.   acetaminophen  500 MG tablet Commonly known as: TYLENOL  Take 1,000 mg by mouth every 6 (six) hours as needed for moderate pain or headache.   albuterol  108 (90 Base) MCG/ACT inhaler Commonly known as: VENTOLIN  HFA Inhale 1-2 puffs into the lungs every 4 (four) hours as needed for wheezing or shortness of breath.   aspirin  81 MG tablet Take 81 mg by mouth daily.   atorvastatin  40 MG tablet Commonly known as: LIPITOR Take 1 tablet (40 mg total) by mouth daily.   azelastine  0.1 % nasal spray Commonly known as: ASTELIN  Place 1 spray into both nostrils 2 (two) times daily.   budesonide -glycopyrrolate -formoterol  160-9-4.8 MCG/ACT Aero inhaler Commonly known as: BREZTRI  Inhale 2 puffs into the lungs 2 (two) times daily.   cyclobenzaprine  5 MG tablet Commonly known as: FLEXERIL  TAKE ONE TABLET (5 MG TOTAL) BY  MOUTH THREE TIMES DAILY AS NEEDED FOR MUSCLE SPASMS.   dapagliflozin  propanediol 10 MG Tabs tablet Commonly known as: Farxiga  Take 1 tablet (10 mg total) by mouth daily before breakfast.   donepezil  10 MG tablet Commonly known as: ARICEPT  Take half tablet (5 mg) daily for 2 weeks, then increase to the full tablet at 10 mg daily   ferrous sulfate  325 (65 FE) MG tablet Take 1 tablet (325 mg total) by mouth daily with  breakfast.   fluticasone  50 MCG/ACT nasal spray Commonly known as: FLONASE  Place 2 sprays into both nostrils daily as needed for allergies.   furosemide  40 MG tablet Commonly known as: LASIX  Take 1 tablet (40 mg total) by mouth every Monday, Wednesday, and Friday.   ipratropium-albuterol  0.5-2.5 (3) MG/3ML Soln Commonly known as: DUONEB Take 3 mLs by nebulization every 6 (six) hours as needed.   losartan  50 MG tablet Commonly known as: COZAAR  Take 1 tablet (50 mg total) by mouth daily.   nicotine  21 mg/24hr patch Commonly known as: NICODERM CQ  - dosed in mg/24 hours PLACE ONE PATCH (21 MG TOTAL) ONTO THE SKIN DAILY.   OXYGEN  Inhale 2 L/min into the lungs continuous.   spironolactone  25 MG tablet Commonly known as: ALDACTONE  Take 1 tablet (25 mg total) by mouth daily.   tamsulosin  0.4 MG Caps capsule Commonly known as: FLOMAX  TAKE ONE CAPSULE (0.4 MG TOTAL) BY MOUTH DAILY. (NEEDS APPT FOR FUTURE FILL)   Vitamin D3 125 MCG (5000 UT) Tabs Take 5,000 Units by mouth daily.         Follow-up Information     [name], FNP Follow up in 1 week(s).   Specialty: Family Medicine Why: Office will call Patient w/ Appt Contact information: [address] [place] [address] [phone]                 Allergies[1]   The results of significant diagnostics from this hospitalization (including imaging, microbiology, ancillary and laboratory) are listed below for reference.   Consultations:   Procedures/Studies: DG Chest Portable 1 View Result Date: 09/02/2024 EXAM: 1 VIEW(S) XRAY OF THE CHEST 09/02/2024 10:28:48 PM COMPARISON: 09/02/2024 CLINICAL HISTORY: sob FINDINGS: LUNGS AND PLEURA: Right apical pleural/pulmonary scarring with stable associated right-sided volume loss. Small right pleural effusion. No pneumothorax. HEART AND MEDIASTINUM: Aortic calcification. The cardiac silhouette shows no acute abnormality. BONES AND SOFT TISSUES: Cervical spine  surgical hardware noted. No acute osseous abnormality. IMPRESSION: 1. Stable small right pleural effusion. 2. Right apical pleural/pulmonary scarring with stable associated right-sided volume loss. Electronically signed by: Dorethia Molt MD 09/02/2024 11:34 PM EST RP Workstation: [id number]   DG Chest Port 1 View Result Date: 09/02/2024 EXAM: 1 VIEW(S) XRAY OF THE CHEST 09/02/2024 06:24:00 AM COMPARISON: Portable chest x-ray 07/30/2024, chest CT 04/16/2024. CLINICAL HISTORY: 78 year old male with shortness of breath. 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 Lung volumes and ventilation appear stable since that time. Evidence of stable small chronic right pleural effusion or pleural thickening. No acute lung opacity. No pneumothorax. HEART AND MEDIASTINUM: No acute abnormality of the cardiac and mediastinal silhouettes. BONES AND SOFT TISSUES: Partially visible cervical ACDF hardware. No acute osseous abnormality. Negative visible bowel gas. IMPRESSION: 1. 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 No acute cardiopulmonary abnormality. Electronically signed by: Helayne Hurst MD 09/02/2024 06:30 AM EST RP Workstation: [id number]  Labs: BNP (last 3 results) Recent Labs    03/30/24 0510 04/16/24 0333 06/13/24 1257  BNP 62.3 50.9 52.0   Basic Metabolic Panel: Recent Labs  Lab 09/02/24 0631 09/02/24 0636 09/02/24 2203 09/03/24 0322 09/04/24 0904  NA 144 143 148* 147* 145  K 3.7 3.7 4.0 4.2 4.2  CL 97*  --  99 100 100  CO2 39*  --  36* 38* 40*  GLUCOSE 106*  --  113* 125* 81  BUN 22  --  21 20 21   CREATININE 0.82  --  0.91 0.88 0.77  CALCIUM  9.2  --  9.0 8.9 8.8*   Liver Function Tests: Recent Labs  Lab 09/02/24 2203  AST 21  ALT 12  ALKPHOS 101  BILITOT 0.2  PROT 6.7  ALBUMIN  4.0   No results for input(s):  LIPASE, AMYLASE in the last 168 hours. No results for input(s): AMMONIA in the last 168 hours. CBC: Recent Labs  Lab 09/02/24 0631 09/02/24 0636 09/02/24 2203 09/03/24 0322  WBC 7.9  --  7.5 6.9  NEUTROABS 6.9  --   --   --   HGB 10.8* 12.9* 10.4* 10.0*  HCT 37.9* 38.0* 39.1 36.6*  MCV 83.1  --  86.7 85.1  PLT 189  --  209 193   Cardiac Enzymes: No results for input(s): CKTOTAL, CKMB, CKMBINDEX, TROPONINI in the last 168 hours. BNP: Invalid input(s): POCBNP CBG: No results for input(s): GLUCAP in the last 168 hours. D-Dimer No results for input(s): DDIMER in the last 72 hours. Hgb A1c No results for input(s): HGBA1C in the last 72 hours. Lipid Profile No results for input(s): CHOL, HDL, LDLCALC, TRIG, CHOLHDL, LDLDIRECT in the last 72 hours. Thyroid function studies No results for input(s): TSH, T4TOTAL, T3FREE, THYROIDAB in the last 72 hours.  Invalid input(s): FREET3 Anemia work up No results for input(s): VITAMINB12, FOLATE, FERRITIN, TIBC, IRON , RETICCTPCT in the last 72 hours. Urinalysis    Component Value Date/Time   COLORURINE YELLOW (A) 06/13/2024 1539   APPEARANCEUR CLEAR (A) 06/13/2024 1539   LABSPEC 1.010 06/13/2024 1539   PHURINE 7.0 06/13/2024 1539   GLUCOSEU NEGATIVE 06/13/2024 1539   HGBUR NEGATIVE 06/13/2024 1539   BILIRUBINUR NEGATIVE 06/13/2024 1539   KETONESUR NEGATIVE 06/13/2024 1539   PROTEINUR NEGATIVE 06/13/2024 1539   NITRITE NEGATIVE 06/13/2024 1539   LEUKOCYTESUR NEGATIVE 06/13/2024 1539   Sepsis Labs Recent Labs  Lab 09/02/24 0631 09/02/24 2203 09/03/24 0322  WBC 7.9 7.5 6.9   Microbiology Recent Results (from the past 240 hours)  Resp panel by RT-PCR (RSV, Flu A&B, Covid) Anterior Nasal Swab     Status: None   Collection Time: 09/02/24 11:19 PM   Specimen: Anterior Nasal Swab  Result Value Ref Range Status   SARS Coronavirus 2 by RT PCR NEGATIVE NEGATIVE Final     Comment: (NOTE) SARS-CoV-2 target nucleic acids are NOT DETECTED.  The SARS-CoV-2 RNA is generally detectable in upper respiratory specimens during the acute phase of infection. The lowest concentration of SARS-CoV-2 viral copies this assay can detect is 138 copies/mL. A negative result does not preclude SARS-Cov-2 infection and should not be used as the sole basis for treatment or other patient management decisions. A negative result may occur with  improper specimen collection/handling, submission of specimen other than nasopharyngeal swab, presence of viral mutation(s) within the areas targeted by this assay, and inadequate number of viral copies(<138 copies/mL). A negative result must be combined with clinical observations, patient history, and epidemiological  information. The expected result is Negative.  Fact Sheet for Patients:  bloggercourse.com  Fact Sheet for Healthcare Providers:  seriousbroker.it  This test is no t yet approved or cleared by the [place]  FDA and  has been authorized for detection and/or diagnosis of SARS-CoV-2 by FDA under an Emergency Use Authorization (EUA). This EUA will remain  in effect (meaning this test can be used) for the duration of the COVID-19 declaration under Section 564(b)(1) of the Act, 21 U.S.C.section 360bbb-3(b)(1), unless the authorization is terminated  or revoked sooner.       Influenza A by PCR NEGATIVE NEGATIVE Final   Influenza B by PCR NEGATIVE NEGATIVE Final    Comment: (NOTE) The Xpert Xpress SARS-CoV-2/FLU/RSV plus assay is intended as an aid in the diagnosis of influenza from Nasopharyngeal swab specimens and should not be used as a sole basis for treatment. Nasal washings and aspirates are unacceptable for Xpert Xpress SARS-CoV-2/FLU/RSV testing.  Fact Sheet for Patients: bloggercourse.com  Fact Sheet for Healthcare  Providers: seriousbroker.it  This test is not yet approved or cleared by the [place]  FDA and has been authorized for detection and/or diagnosis of SARS-CoV-2 by FDA under an Emergency Use Authorization (EUA). This EUA will remain in effect (meaning this test can be used) for the duration of the COVID-19 declaration under Section 564(b)(1) of the Act, 21 U.S.C. section 360bbb-3(b)(1), unless the authorization is terminated or revoked.     Resp Syncytial Virus by PCR NEGATIVE NEGATIVE Final    Comment: (NOTE) Fact Sheet for Patients: bloggercourse.com  Fact Sheet for Healthcare Providers: seriousbroker.it  This test is not yet approved or cleared by the [place]  FDA and has been authorized for detection and/or diagnosis of SARS-CoV-2 by FDA under an Emergency Use Authorization (EUA). This EUA will remain in effect (meaning this test can be used) for the duration of the COVID-19 declaration under Section 564(b)(1) of the Act, 21 U.S.C. section 360bbb-3(b)(1), unless the authorization is terminated or revoked.  Performed at [facility], [address] [place], [place] [address]      Total time spend on discharging this patient, including the last patient exam, discussing the hospital stay, instructions for ongoing care as it relates to all pertinent caregivers, as well as preparing the medical discharge records, prescriptions, and/or referrals as applicable, is 35 minutes.    Ellouise Haber, MD  [facility] 09/06/2024, 11:31 AM       [1] No Known Allergies  "

## 2024-09-06 NOTE — Plan of Care (Signed)

## 2024-09-06 NOTE — TOC Transition Note (Signed)
 Transition of Care [facility]) - Discharge Note   Patient Details  Name: Colton Nguyen MRN: [id number] Date of Birth: 07/02/1946  [facility]) CM/SW Contact:  Grayce JAYSON Perfect, RN Phone Number: 09/06/2024, 12:52 PM   Clinical Narrative:   Patient discharging today with HomeHealth referral, oxygen .  Home Health company notified of discharge.  Patient has PCP, gets his medications from pharmacy in [place],  denies problem paying for meds.  Son lives with him and provides transportation , will transport him home.     Final next level of care: Home w Home Health Services Barriers to Discharge: No Barriers Identified   Patient Goals and CMS Choice            Discharge Placement                       Discharge Plan and Services Additional resources added to the After Visit Summary for                            HH Arranged: PT, OT, RN [facility] Agency: [employer] [facility] Services Date [facility] Agency Contacted: 09/06/24   Representative spoke with at [facility] Agency: [place]  Social Drivers of Health (SDOH) Interventions SDOH Screenings   Food Insecurity: No Food Insecurity (09/04/2024)  Housing: Unknown (09/04/2024)  Transportation Needs: No Transportation Needs (09/04/2024)  Utilities: Not At Risk (09/04/2024)  Depression (PHQ2-9): Low Risk (01/30/2023)  Financial Resource Strain: Low Risk (01/30/2023)  Social Connections: Moderately Integrated (09/04/2024)  Tobacco Use: High Risk (09/02/2024)     Readmission Risk Interventions    09/06/2024   12:39 PM  Readmission Risk Prevention Plan  Transportation Screening Complete  Medication Review (RN Care Manager) Complete  PCP or Specialist appointment within 3-5 days of discharge Complete  HRI or Home Care Consult Complete  SW Recovery Care/Counseling Consult Complete  Palliative Care Screening Not Applicable  Skilled Nursing Facility Not Applicable

## 2024-09-08 ENCOUNTER — Telehealth: Payer: Self-pay

## 2024-09-08 NOTE — Transitions of Care (Post Inpatient/ED Visit) (Signed)
" ° °  09/08/2024  Name: Colton Nguyen MRN: [id number] DOB: [birth year]  Today's TOC FU Call Status: Today's TOC FU Call Status:: Unsuccessful Call (1st Attempt) Unsuccessful Call (1st Attempt) Date: 09/08/24  Attempted to reach the patient regarding the most recent Inpatient/ED visit.  Follow Up Plan: Additional outreach attempts will be made to reach the patient to complete the Transitions of Care (Post Inpatient/ED visit) call.   Demiana Crumbley J. Byanka Landrus RN, MSN [facility] RN Care Manager Direct Dial: [phone]  Fax: [phone] Website: [email or web address]   "

## 2024-09-11 ENCOUNTER — Telehealth: Payer: Self-pay

## 2024-09-11 NOTE — Transitions of Care (Post Inpatient/ED Visit) (Signed)
" ° °  09/11/2024  Name: Colton Nguyen MRN: [id number] DOB: Feb 11, 1946  Today's TOC FU Call Status: Today's TOC FU Call Status:: Unsuccessful Call (2nd Attempt) Unsuccessful Call (1st Attempt) Date: 09/08/24 Unsuccessful Call (2nd Attempt) Date: 09/11/24  Attempted to reach the patient regarding the most recent Inpatient/ED visit.  Follow Up Plan: Additional outreach attempts will be made to reach the patient to complete the Transitions of Care (Post Inpatient/ED visit) call.   Medford Balboa, BSN, RN [place]  [facility] - Population Health RN Care Manager [phone]  "

## 2024-09-12 ENCOUNTER — Telehealth

## 2024-09-16 ENCOUNTER — Other Ambulatory Visit: Payer: Self-pay | Admitting: Family

## 2024-09-20 ENCOUNTER — Encounter: Payer: Self-pay | Admitting: Family

## 2024-09-20 ENCOUNTER — Ambulatory Visit (INDEPENDENT_AMBULATORY_CARE_PROVIDER_SITE_OTHER): Admitting: Family

## 2024-09-20 VITALS — BP 126/58 | HR 66 | Ht 68.0 in | Wt 143.0 lb

## 2024-09-20 DIAGNOSIS — D509 Iron deficiency anemia, unspecified: Secondary | ICD-10-CM

## 2024-09-20 DIAGNOSIS — F17200 Nicotine dependence, unspecified, uncomplicated: Secondary | ICD-10-CM | POA: Diagnosis not present

## 2024-09-20 DIAGNOSIS — R5383 Other fatigue: Secondary | ICD-10-CM

## 2024-09-20 DIAGNOSIS — R7303 Prediabetes: Secondary | ICD-10-CM | POA: Diagnosis not present

## 2024-09-20 DIAGNOSIS — E785 Hyperlipidemia, unspecified: Secondary | ICD-10-CM | POA: Diagnosis not present

## 2024-09-20 DIAGNOSIS — Z0001 Encounter for general adult medical examination with abnormal findings: Secondary | ICD-10-CM | POA: Diagnosis not present

## 2024-09-20 DIAGNOSIS — E559 Vitamin D deficiency, unspecified: Secondary | ICD-10-CM

## 2024-09-20 DIAGNOSIS — J431 Panlobular emphysema: Secondary | ICD-10-CM | POA: Diagnosis not present

## 2024-09-20 DIAGNOSIS — I10 Essential (primary) hypertension: Secondary | ICD-10-CM | POA: Diagnosis not present

## 2024-09-20 DIAGNOSIS — I5032 Chronic diastolic (congestive) heart failure: Secondary | ICD-10-CM

## 2024-09-20 DIAGNOSIS — Z0000 Encounter for general adult medical examination without abnormal findings: Secondary | ICD-10-CM | POA: Insufficient documentation

## 2024-09-20 DIAGNOSIS — J9601 Acute respiratory failure with hypoxia: Secondary | ICD-10-CM | POA: Diagnosis not present

## 2024-09-20 NOTE — Progress Notes (Signed)
 "   Annual Wellness Visit  Patient: Colton Nguyen, Male    DOB: June 22, 1946, 79 y.o.   MRN: [id number] Visit Date: 09/20/2024  Today's Provider: ALAN CHRISTELLA ARRANT, FNP  Subjective:    Chief Complaint  Patient presents with   Annual Exam   Colton Nguyen is a 79 y.o. male who presents today for his Annual Wellness Visit.  Patient is here today for his Medicare AWV. He reports feeling fair today. He is accompanied by his sister who helps take care of him.  Patient states not using inhalers regularly. Caregiver reports not using cpap machine daily and is concerned his C02 is building up again. His BP is elevated today and his 02 is 86% on RA. He has portable oxygen  but his sister states is died on the way over here so it is in the car charging. Placed patient on 3L via [place] at 100% in the office. His vitals improved. Denies recent illness, denies cough or increased mucous production. Patient is still smoking cigarettes and states he is not interested in stopping at this time.  Sister reports home health stopped coming to his house the week of Christmas and have not returned. She is unsure if he needs a new order or not. Will submit new home health order.  Patient is due for routine blood work and will get that completed today. PHQ-9 score 4; GAD-7 0; 6CIT 18. Patient is hypoxic today and will recheck when he returns.    Past Medical History:  Diagnosis Date   Acute respiratory failure with hypoxia (HCC) 01/28/2023   Anxiety    situational   Arthritis    BPH (benign prostatic hyperplasia)    CAD (coronary artery disease)    CHF (congestive heart failure) (HCC)    pt denies 11/13/2019   Chronic cardiopulmonary disease (HCC)    COPD (chronic obstructive pulmonary disease) (HCC)    History of kidney stones    Hyperlipidemia    Hypertension    Hypertension 12/28/2016   lung ca dx'd 03/2015   Mass of lung    Nicotine  dependence    Personal history of kidney stones    Restless  leg    Shortness of breath dyspnea    with exertion   Tuberculosis    Wears glasses    Past Surgical History:  Procedure Laterality Date   BACK SURGERY     BIOPSY  01/25/2020   Procedure: BIOPSY;  Surgeon: Legrand Victory LITTIE DOUGLAS, MD;  Location: [facility];  Service: Gastroenterology;;   CAD CCTA 01/31/15     CERVICAL FUSION     COLONOSCOPY     COLONOSCOPY WITH PROPOFOL  N/A 01/25/2020   Procedure: COLONOSCOPY WITH PROPOFOL ;  Surgeon: Legrand Victory LITTIE DOUGLAS, MD;  Location: [facility];  Service: Gastroenterology;  Laterality: N/A;   ESOPHAGOGASTRODUODENOSCOPY (EGD) WITH PROPOFOL  N/A 01/25/2020   Procedure: ESOPHAGOGASTRODUODENOSCOPY (EGD) WITH PROPOFOL ;  Surgeon: Legrand Victory LITTIE DOUGLAS, MD;  Location: [facility];  Service: Gastroenterology;  Laterality: N/A;   FRACTURE SURGERY     left ankle   HEMOSTASIS CLIP PLACEMENT  01/25/2020   Procedure: HEMOSTASIS CLIP PLACEMENT;  Surgeon: Legrand Victory LITTIE DOUGLAS, MD;  Location: [facility];  Service: Gastroenterology;;   HOT HEMOSTASIS N/A 01/25/2020   Procedure: HOT HEMOSTASIS (ARGON PLASMA COAGULATION/BICAP);  Surgeon: Legrand Victory LITTIE DOUGLAS, MD;  Location: [facility];  Service: Gastroenterology;  Laterality: N/A;   LUNG BIOPSY N/A 02/27/2015   Procedure: LUNG BIOPSY;  Surgeon: Dallas KATHEE Jude, MD;  Location: [facility];  Service: Thoracic;  Laterality: N/A;   right inguinal hernia repair at age 14     VIDEO BRONCHOSCOPY WITH ENDOBRONCHIAL ULTRASOUND N/A 02/27/2015   Procedure: VIDEO BRONCHOSCOPY WITH ENDOBRONCHIAL ULTRASOUND;  Surgeon: Dallas KATHEE Jude, MD;  Location: [facility];  Service: Thoracic;  Laterality: N/A;   Family History  Problem Relation Age of Onset   Cancer Other    Heart disease Father    Lung cancer Father    Hypertension Other    Diabetes Mother    Valvular heart disease Mother    Diabetes Sister    Colon polyps Maternal Uncle    Diabetes Sister    Diabetes Sister    Diabetes Niece    Colon cancer Cousin    Esophageal cancer Neg Hx     Stomach cancer Neg Hx    Pancreatic cancer Neg Hx    Social History   Socioeconomic History   Marital status: Divorced    Spouse name: Not on file   Number of children: Not on file   Years of education: Not on file   Highest education level: Not on file  Occupational History   Not on file  Tobacco Use   Smoking status: Every Day    Current packs/day: 2.50    Average packs/day: 2.5 packs/day for 56.6 years (141.5 ttl pk-yrs)    Types: Cigarettes    Start date: 02/14/1968    Passive exposure: Current   Smokeless tobacco: Never  Vaping Use   Vaping status: Former  Substance and Sexual Activity   Alcohol  use: Yes    Alcohol /week: 0.0 standard drinks of alcohol     Comment: ocassional   Drug use: No   Sexual activity: Yes  Other Topics Concern   Not on file  Social History Narrative   Not on file   Social Drivers of Health   Tobacco Use: High Risk (09/20/2024)   Patient History    Smoking Tobacco Use: Every Day    Smokeless Tobacco Use: Never    Passive Exposure: Current  Financial Resource Strain: Low Risk (01/30/2023)   Overall Financial Resource Strain (CARDIA)    Difficulty of Paying Living Expenses: Not very hard  Food Insecurity: No Food Insecurity (09/04/2024)   Epic    Worried About Programme Researcher, Broadcasting/film/video in the Last Year: Never true    Ran Out of Food in the Last Year: Never true  Transportation Needs: No Transportation Needs (09/04/2024)   Epic    Lack of Transportation (Medical): No    Lack of Transportation (Non-Medical): No  Physical Activity: Not on file  Stress: Not on file  Social Connections: Moderately Integrated (09/04/2024)   Social Connection and Isolation Panel    Frequency of Communication with Friends and Family: More than three times a week    Frequency of Social Gatherings with Friends and Family: Three times a week    Attends Religious Services: 1 to 4 times per year    Active Member of Clubs or Organizations: Yes    Attends Tax Inspector Meetings: 1 to 4 times per year    Marital Status: Divorced  Intimate Partner Violence: Not At Risk (09/04/2024)   Epic    Fear of Current or Ex-Partner: No    Emotionally Abused: No    Physically Abused: No    Sexually Abused: No  Depression (PHQ2-9): Medium Risk (09/20/2024)   Depression (PHQ2-9)    PHQ-2 Score: 5  Alcohol  Screen: Not on file  Housing: Unknown (09/04/2024)   Epic    Unable to Pay for Housing in the Last Year: No    Number of Times Moved in the Last Year: Not on file    Homeless in the Last Year: No  Utilities: Not At Risk (09/04/2024)   Epic    Threatened with loss of utilities: No  Health Literacy: Not on file    Medications: Show/hide medication list[1]  Allergies[2]  Patient Care Team: [name], FNP as PCP - General (Family Medicine) Fernand Denyse LABOR, MD as Consulting Physician (Cardiology) [employer] [place] NOVAK, MD (Inactive) as Consulting Physician (Cardiothoracic Surgery)  Review of Systems  Respiratory:  Positive for shortness of breath.   Neurological:  Positive for weakness.  Psychiatric/Behavioral:  Positive for confusion.   All other systems reviewed and are negative.   Last CBC Lab Results  Component Value Date   WBC 6.9 09/03/2024   HGB 10.0 (L) 09/03/2024   HCT 36.6 (L) 09/03/2024   MCV 85.1 09/03/2024   MCH 23.3 (L) 09/03/2024   RDW 17.3 (H) 09/03/2024   PLT 193 09/03/2024   Last metabolic panel Lab Results  Component Value Date   GLUCOSE 81 09/04/2024   NA 145 09/04/2024   K 4.2 09/04/2024   CL 100 09/04/2024   CO2 40 (H) 09/04/2024   BUN 21 09/04/2024   CREATININE 0.77 09/04/2024   GFRNONAA >60 09/04/2024   CALCIUM  8.8 (L) 09/04/2024   PHOS 3.3 04/04/2024   PROT 6.7 09/02/2024   ALBUMIN  4.0 09/02/2024   LABGLOB 2.7 08/09/2023   AGRATIO 1.7 01/28/2023   BILITOT 0.2 09/02/2024   ALKPHOS 101 09/02/2024   AST 21 09/02/2024   ALT 12 09/02/2024   ANIONGAP 5 09/04/2024   Last lipids Lab Results   Component Value Date   CHOL 105 08/09/2023   HDL 52 08/09/2023   LDLCALC 41 08/09/2023   TRIG 51 08/09/2023   CHOLHDL 2.0 08/09/2023   Last hemoglobin A1c Lab Results  Component Value Date   HGBA1C 5.7 (H) 08/09/2023   Last thyroid functions Lab Results  Component Value Date   TSH 0.351 06/13/2024   FREET4 1.34 12/14/2023   Last vitamin D  Lab Results  Component Value Date   VD25OH 59.9 08/09/2023   Last vitamin B12 and Folate Lab Results  Component Value Date   VITAMINB12 404 08/09/2023       Objective:    Vitals: BP (!) 126/58   Pulse 66   Ht 5' 8 (1.727 m)   Wt 143 lb (64.9 kg)   SpO2 99%   BMI 21.74 kg/m  BP Readings from Last 3 Encounters:  09/20/24 (!) 126/58  09/06/24 (!) 150/88  09/02/24 (!) 156/85   Wt Readings from Last 3 Encounters:  09/20/24 143 lb (64.9 kg)  09/02/24 141 lb 15.6 oz (64.4 kg)  08/22/24 151 lb (68.5 kg)   SpO2 Readings from Last 3 Encounters:  09/20/24 99%  09/06/24 97%  09/02/24 100%      Physical Exam Vitals and nursing note reviewed.  Constitutional:      Appearance: Normal appearance.  HENT:     Head: Normocephalic.  Eyes:     Extraocular Movements: Extraocular movements intact.     Pupils: Pupils are equal, round, and reactive to light.  Cardiovascular:     Rate and Rhythm: Normal rate and regular rhythm.     Pulses: Normal pulses.     Heart sounds: Normal heart sounds. No murmur heard. Pulmonary:  Effort: Pulmonary effort is normal. Tachypnea present. No respiratory distress.     Breath sounds: Decreased breath sounds present.  Abdominal:     General: There is no distension.     Tenderness: There is no abdominal tenderness.  Musculoskeletal:        General: No tenderness. Normal range of motion.     Cervical back: Normal range of motion and neck supple.     Right lower leg: No edema.     Left lower leg: No edema.  Skin:    General: Skin is warm and dry.     Coloration: Skin is not jaundiced.      Findings: No erythema.  Neurological:     General: No focal deficit present.     Mental Status: He is alert and oriented to person, place, and time.  Psychiatric:        Mood and Affect: Mood normal. Affect is flat.        Speech: Speech is delayed.        Behavior: Behavior is slowed. Behavior is cooperative.        Cognition and Memory: Memory is impaired.      Most recent functional status assessment:    09/04/2024    8:43 AM  In your present state of health, do you have any difficulty performing the following activities:  Doing errands, shopping? 0    Most recent fall risk assessment:    09/20/2024    3:34 PM  Fall Risk   Falls in the past year? 1  Number falls in past yr: 1  Injury with Fall? 0  Risk for fall due to : History of fall(s);Impaired balance/gait;Mental status change  Follow up Falls evaluation completed;Falls prevention discussed     Most recent depression screenings:    09/20/2024    1:58 PM 01/30/2023    2:26 PM  PHQ 2/9 Scores  PHQ - 2 Score 0 0  PHQ- 9 Score 5     Most recent cognitive screening:    09/20/2024    1:59 PM  6CIT Screen  What Year? 0 points  What month? 3 points  What time? 3 points  Count back from 20 0 points  Months in reverse 2 points  Repeat phrase 10 points  Total Score 18 points    No results found for any visits on 09/20/24.     Assessment & Plan:      Annual wellness visit done today including the all of the following: Reviewed patient's Family Medical History Reviewed and updated list of patient's medical providers Assessment of cognitive impairment was done Assessed patient's functional ability Established a written schedule for health screening services Health Risk Assessent Completed and Reviewed  Exercise Activities and Dietary recommendations  Goals      Disease Progression Minimized or Managed     Evidence-based guidance:  Identify current smoking/tobacco use; provide smoking cessation  intervention.  Assess symptom control by the frequency and type of symptoms, reliever use and activity limitation at every encounter.  Assess risk for exacerbation (flare up) by evaluating spirometry, pulse oximetry, reliever use, presentation of symptoms and activity limitation; anticipate treatment adjustment based on risks and resources.  Develop and/or review and reinforce use of COPD rescue (action) plan even when symptoms are controlled or infrequent.  Ask patient to bring inhaler to all visits; assess and reinforce correct technique; address barriers to proper inhaler use, such as older age, use of multiple devices and lack of understanding.  Identify symptom triggers, such as smoking, virus, weather change, emotional upset, exercise, obesity and environmental allergen; consider reduction of work-exposure versus elimination to avoid compromising employment.  Correlate presentation to comorbidity, such as diabetes, heart failure, obstructive sleep apnea, depression and anxiety, which may worsen symptoms.  Prepare for individualized pharmacologic therapy that may include LABA (long-acting beta-2 agonist), LAMA (long-acting muscarinic antagonist), SABA (short-acting beta-2 agonist) oral or inhaled corticosteroid.  Promote participation in pulmonary rehabilitation for breathing exercises, skills training, improved exercise capacity, mood and quality of life; address barriers to participation.  Promote physical activity or exercise to improve or maintain exercise capacity, based on tolerance that may include walking, water  exercise, cycling or limb muscle strength training.  Promote use of energy conservation and activity pacing techniques.  Promote use of breathing and coughing techniques, such as inspiratory muscle training, pursed-lip breathing, diaphragmatic breathing, pranayama yoga breathing or huff cough.  Screen for malnutrition risk factors, such as unintentional weight loss and poor oral  intake; refer to dietitian if identified.  Consider recommendation for oral drink supplement or multivitamin and mineral supplements if suspect inadequate oral intake or micronutrient deficiencies.   Screen for obstructive sleep apnea; prepare patient for polysomnography based on risk and presentation.  Prepare patient for use of long-term oxygen  and noninvasive ventilation to relieve hypercapnia, hypoxemia, obstructive sleep apnea and reduce work of breathing.  Prepare patient with worsening disease for surgical interventions that may include bronchoscopy, lung volume reduction surgery, bullectomy or lung transplantation.   Notes:      Maintain Mobility and Function     Evidence-based guidance:  Acknowledge and validate impact of pain, loss of strength and potential disfigurement (hand osteoarthritis) on mental health and daily life, such as social isolation, anxiety, depression, impaired sexual relationship and   injury from falls.  Anticipate referral to physical or occupational therapy for assessment, therapeutic exercise and recommendation for adaptive equipment or assistive devices; encourage participation.  Assess impact on ability to perform activities of daily living, as well as engage in sports and leisure events or requirements of work or school.  Provide anticipatory guidance and reassurance about the benefit of exercise to maintain function; acknowledge and normalize fear that exercise may worsen symptoms.  Encourage regular exercise, at least 10 minutes at a time for 45 minutes per week; consider yoga, water  exercise and proprioceptive exercises; encourage use of wearable activity tracker to increase motivation and adherence.  Encourage maintenance or resumption of daily activities, including employment, as pain allows and with minimal exposure to trauma.  Assist patient to advocate for adaptations to the work environment.  Consider level of pain and function, gender, age, lifestyle,  patient preference, quality of life, readiness and capacity to benefit when recommending patients for orthopaedic surgery consultation.  Explore strategies, such as changes to medication regimen or activity that enables patient to anticipate and manage flare-ups that increase deconditioning and disability.  Explore patient preferences; encourage exposure to a broader range of activities that have been avoided for fear of experiencing pain.  Identify barriers to participation in therapy or exercise, such as pain with activity, anticipated or imagined pain.  Monitor postoperative joint replacement or any preexisting joint replacement for ongoing pain and loss of function; provide social support and encouragement throughout recovery.   Notes:         Immunization History  Administered Date(s) Administered   Fluad Trivalent(High Dose 65+) 08/09/2023, 08/08/2024   Influenza-Unspecified 08/05/2015   PFIZER Comirnaty(Gray Top)Covid-19 Tri-Sucrose Vaccine 01/10/2021   Zoster Recombinant(Shingrix)  06/30/2017, 09/22/2017    Health Maintenance  Topic Date Due   DTaP/Tdap/Td (1 - Tdap) Never done   Pneumococcal Vaccine: 50+ Years (1 of 2 - PCV) Never done   COVID-19 Vaccine (2 - Pfizer risk series) 01/31/2021   Medicare Annual Wellness (AWV)  01/28/2024   Influenza Vaccine  Completed   Hepatitis C Screening  Completed   Zoster Vaccines- Shingrix  Completed   Meningococcal B Vaccine  Aged Out   Colonoscopy  Discontinued     Discussed health benefits of physical activity, and encouraged him to engage in regular exercise appropriate for his age and condition.    Advised patient to take medications as prescribed. Reinforced need for complete smoking cessation and using CPAP as prescribed. Educated patient and caregiver to take patient to the hospital if oxygen  saturation does not improve with at home oxygen  use, oxygen  saturation remains below 85%, increasing signs of confusion or LOC  occurs. Patient and caregiver verbalized understanding.      ALAN CHRISTELLA ARRANT, FNP   09/20/2024  This document may have been prepared by Dragon Voice Recognition software and as such may include unintentional dictation errors.      [1]  Outpatient Medications Prior to Visit  Medication Sig   acetaminophen  (TYLENOL ) 500 MG tablet Take 1,000 mg by mouth every 6 (six) hours as needed for moderate pain or headache.   albuterol  (VENTOLIN  HFA) 108 (90 Base) MCG/ACT inhaler Inhale 1-2 puffs into the lungs every 4 (four) hours as needed for wheezing or shortness of breath.   aspirin  81 MG tablet Take 81 mg by mouth daily.   atorvastatin  (LIPITOR) 40 MG tablet Take 1 tablet (40 mg total) by mouth daily.   azelastine  (ASTELIN ) 0.1 % nasal spray Place 1 spray into both nostrils 2 (two) times daily.   budesonide -glycopyrrolate -formoterol  (BREZTRI ) 160-9-4.8 MCG/ACT AERO inhaler Inhale 2 puffs into the lungs 2 (two) times daily.   Cholecalciferol  (VITAMIN D3) 5000 UNITS TABS Take 5,000 Units by mouth daily.   cyclobenzaprine  (FLEXERIL ) 5 MG tablet TAKE ONE TABLET (5 MG TOTAL) BY MOUTH THREE TIMES DAILY AS NEEDED FOR MUSCLE SPASMS.   dapagliflozin  propanediol (FARXIGA ) 10 MG TABS tablet Take 1 tablet (10 mg total) by mouth daily before breakfast.   donepezil  (ARICEPT ) 10 MG tablet Take half tablet (5 mg) daily for 2 weeks, then increase to the full tablet at 10 mg daily   ferrous sulfate  325 (65 FE) MG tablet Take 1 tablet (325 mg total) by mouth daily with breakfast.   fluticasone  (FLONASE ) 50 MCG/ACT nasal spray Place 2 sprays into both nostrils daily as needed for allergies.    furosemide  (LASIX ) 40 MG tablet Take 1 tablet (40 mg total) by mouth every Monday, Wednesday, and Friday.   ipratropium-albuterol  (DUONEB) 0.5-2.5 (3) MG/3ML SOLN Take 3 mLs by nebulization every 6 (six) hours as needed.   losartan  (COZAAR ) 50 MG tablet Take 1 tablet (50 mg total) by mouth daily.   nicotine  (NICODERM CQ  -  DOSED IN MG/24 HOURS) 21 mg/24hr patch PLACE ONE PATCH (21 MG TOTAL) ONTO THE SKIN DAILY.   OXYGEN  Inhale 2 L/min into the lungs continuous.   pramipexole  (MIRAPEX ) 0.125 MG tablet TAKE 1 TABLET (0.125 MG) BY MOUTH AT BEDTIME.   spironolactone  (ALDACTONE ) 25 MG tablet Take 1 tablet (25 mg total) by mouth daily.   tamsulosin  (FLOMAX ) 0.4 MG CAPS capsule TAKE ONE CAPSULE (0.4 MG TOTAL) BY MOUTH DAILY. (NEEDS APPT FOR FUTURE FILL)   No facility-administered medications  prior to visit.  [2] No Known Allergies  "

## 2024-09-21 LAB — CBC WITH DIFFERENTIAL/PLATELET
Basophils Absolute: 0 x10E3/uL (ref 0.0–0.2)
Basos: 0 %
EOS (ABSOLUTE): 0 x10E3/uL (ref 0.0–0.4)
Eos: 1 %
Hematocrit: 36.6 % — ABNORMAL LOW (ref 37.5–51.0)
Hemoglobin: 10.7 g/dL — ABNORMAL LOW (ref 13.0–17.7)
Immature Grans (Abs): 0 x10E3/uL (ref 0.0–0.1)
Immature Granulocytes: 0 %
Lymphocytes Absolute: 0.8 x10E3/uL (ref 0.7–3.1)
Lymphs: 10 %
MCH: 23.9 pg — ABNORMAL LOW (ref 26.6–33.0)
MCHC: 29.2 g/dL — ABNORMAL LOW (ref 31.5–35.7)
MCV: 82 fL (ref 79–97)
Monocytes Absolute: 0.5 x10E3/uL (ref 0.1–0.9)
Monocytes: 7 %
Neutrophils Absolute: 5.8 x10E3/uL (ref 1.4–7.0)
Neutrophils: 82 %
Platelets: 261 x10E3/uL (ref 150–450)
RBC: 4.47 x10E6/uL (ref 4.14–5.80)
RDW: 15.9 % — ABNORMAL HIGH (ref 11.6–15.4)
WBC: 7.2 x10E3/uL (ref 3.4–10.8)

## 2024-09-21 LAB — CMP14+EGFR
ALT: 12 IU/L (ref 0–44)
AST: 17 IU/L (ref 0–40)
Albumin: 3.9 g/dL (ref 3.8–4.8)
Alkaline Phosphatase: 100 IU/L (ref 47–123)
BUN/Creatinine Ratio: 22 (ref 10–24)
BUN: 17 mg/dL (ref 8–27)
Bilirubin Total: 0.4 mg/dL (ref 0.0–1.2)
CO2: 33 mmol/L — ABNORMAL HIGH (ref 20–29)
Calcium: 9.3 mg/dL (ref 8.6–10.2)
Chloride: 92 mmol/L — ABNORMAL LOW (ref 96–106)
Creatinine, Ser: 0.79 mg/dL (ref 0.76–1.27)
Globulin, Total: 2.1 g/dL (ref 1.5–4.5)
Glucose: 90 mg/dL (ref 70–99)
Potassium: 4.2 mmol/L (ref 3.5–5.2)
Sodium: 143 mmol/L (ref 134–144)
Total Protein: 6 g/dL (ref 6.0–8.5)
eGFR: 91 mL/min/1.73

## 2024-09-21 LAB — HEMOGLOBIN A1C
Est. average glucose Bld gHb Est-mCnc: 103 mg/dL
Hgb A1c MFr Bld: 5.2 % (ref 4.8–5.6)

## 2024-09-21 LAB — TSH: TSH: 0.263 u[IU]/mL — ABNORMAL LOW (ref 0.450–4.500)

## 2024-09-21 LAB — LIPID PANEL
Chol/HDL Ratio: 2 ratio (ref 0.0–5.0)
Cholesterol, Total: 123 mg/dL (ref 100–199)
HDL: 63 mg/dL
LDL Chol Calc (NIH): 46 mg/dL (ref 0–99)
Triglycerides: 67 mg/dL (ref 0–149)
VLDL Cholesterol Cal: 14 mg/dL (ref 5–40)

## 2024-09-21 LAB — VITAMIN D 25 HYDROXY (VIT D DEFICIENCY, FRACTURES): Vit D, 25-Hydroxy: 44.4 ng/mL (ref 30.0–100.0)

## 2024-09-21 LAB — VITAMIN B12: Vitamin B-12: 697 pg/mL (ref 232–1245)

## 2024-09-27 ENCOUNTER — Other Ambulatory Visit: Payer: Self-pay | Admitting: Family

## 2024-09-27 DIAGNOSIS — I5032 Chronic diastolic (congestive) heart failure: Secondary | ICD-10-CM

## 2024-09-29 ENCOUNTER — Telehealth: Payer: Self-pay | Admitting: Family

## 2024-09-29 NOTE — Telephone Encounter (Signed)
 Called in requesting verbal orders for skilled nursing 1w4 to see if the patient will be compliant. Verbal okay given to Hard[place].   She also said that he no longer has his BiPAP, it was picked up because he was not using it.

## 2024-10-05 ENCOUNTER — Encounter: Payer: Self-pay | Admitting: Cardiology

## 2024-10-06 ENCOUNTER — Other Ambulatory Visit: Payer: Self-pay

## 2024-10-06 MED ORDER — DAPAGLIFLOZIN PROPANEDIOL 10 MG PO TABS: 10.0000 mg | ORAL_TABLET | Freq: Every day | ORAL | 1 refills | Status: AC

## 2024-10-10 ENCOUNTER — Telehealth: Payer: Self-pay | Admitting: Family

## 2024-10-10 NOTE — Telephone Encounter (Signed)
 Leita, PT with [facility], left VM requesting verbal orders for PT 1w6.  Also states that patient admits that he is not taking any of his meds and says that he does not need them. Also reported lower leg edema, 2+ but without weight gain.  I verbally informed Alan of this message and she approves for PT 1w6.   Verbal orders given to Leita.

## 2024-10-11 ENCOUNTER — Telehealth: Payer: Self-pay | Admitting: Family

## 2024-10-11 MED ORDER — FUROSEMIDE 20 MG PO TABS: 10.0000 mg | ORAL_TABLET | Freq: Every day | ORAL | 0 refills | Status: AC | PRN

## 2024-10-11 NOTE — Telephone Encounter (Signed)
 Bunnie from [facility] called to inform us  that she hears crackles in patient's lungs and he has lower leg edema.  I spoke with Alan and she said we can try a low dose of 10 mg Lasix  daily PRN. Advised Sandria, patient and patient's sister of this.  They asked about getting him an appt to have Alan listen to his lungs but Amanda's schedule is out for about 2 weeks and he has an appt 2/25 they said. I offered to see a different NP here sooner but they declined.  Sent Rx to [facility] for the Lasix .

## 2024-10-13 ENCOUNTER — Other Ambulatory Visit: Payer: Self-pay | Admitting: Family

## 2024-11-01 ENCOUNTER — Ambulatory Visit: Admitting: Family

## 2024-11-03 ENCOUNTER — Ambulatory Visit: Admitting: Pulmonary Disease

## 2025-02-15 ENCOUNTER — Ambulatory Visit: Payer: Self-pay | Admitting: Physician Assistant
